# Patient Record
Sex: Male | Born: 1935 | Race: White | Hispanic: No | Marital: Married | Smoking: Former smoker
Health system: Southern US, Community
[De-identification: ages and names within clinical notes are randomized; demographics above are authoritative.]

## PROBLEM LIST (undated history)

## (undated) DIAGNOSIS — R011 Cardiac murmur, unspecified: Secondary | ICD-10-CM

## (undated) DIAGNOSIS — N183 Chronic kidney disease, stage 3 unspecified: Secondary | ICD-10-CM

## (undated) DIAGNOSIS — N390 Urinary tract infection, site not specified: Secondary | ICD-10-CM

## (undated) DIAGNOSIS — N289 Disorder of kidney and ureter, unspecified: Secondary | ICD-10-CM

## (undated) DIAGNOSIS — B1920 Unspecified viral hepatitis C without hepatic coma: Secondary | ICD-10-CM

## (undated) DIAGNOSIS — I10 Essential (primary) hypertension: Secondary | ICD-10-CM

## (undated) DIAGNOSIS — R0602 Shortness of breath: Secondary | ICD-10-CM

## (undated) DIAGNOSIS — N1330 Unspecified hydronephrosis: Secondary | ICD-10-CM

## (undated) DIAGNOSIS — E785 Hyperlipidemia, unspecified: Secondary | ICD-10-CM

## (undated) DIAGNOSIS — T7840XA Allergy, unspecified, initial encounter: Secondary | ICD-10-CM

## (undated) DIAGNOSIS — J449 Chronic obstructive pulmonary disease, unspecified: Secondary | ICD-10-CM

## (undated) DIAGNOSIS — N319 Neuromuscular dysfunction of bladder, unspecified: Secondary | ICD-10-CM

## (undated) DIAGNOSIS — I5040 Unspecified combined systolic (congestive) and diastolic (congestive) heart failure: Secondary | ICD-10-CM

## (undated) DIAGNOSIS — E291 Testicular hypofunction: Secondary | ICD-10-CM

## (undated) DIAGNOSIS — H269 Unspecified cataract: Secondary | ICD-10-CM

## (undated) HISTORY — DX: Allergy, unspecified, initial encounter: T78.40XA

## (undated) HISTORY — PX: BLADDER REPAIR: SHX76

## (undated) HISTORY — PX: FRONTAL SINUS OBLITERATION: SHX1685

## (undated) HISTORY — PX: SPINE SURGERY: SHX786

## (undated) HISTORY — DX: Disorder of kidney and ureter, unspecified: N28.9

## (undated) HISTORY — PX: FRACTURE SURGERY: SHX138

## (undated) HISTORY — DX: Shortness of breath: R06.02

## (undated) HISTORY — DX: Unspecified viral hepatitis C without hepatic coma: B19.20

## (undated) HISTORY — DX: Urinary tract infection, site not specified: N39.0

## (undated) HISTORY — DX: Testicular hypofunction: E29.1

## (undated) HISTORY — PX: EYE SURGERY: SHX253

## (undated) HISTORY — DX: Neuromuscular dysfunction of bladder, unspecified: N31.9

## (undated) HISTORY — PX: HERNIA REPAIR: SHX51

## (undated) HISTORY — PX: PROSTATE SURGERY: SHX751

## (undated) HISTORY — DX: Essential (primary) hypertension: I10

## (undated) HISTORY — PX: TONSILLECTOMY: SUR1361

## (undated) HISTORY — DX: Unspecified cataract: H26.9

## (undated) HISTORY — DX: Cardiac murmur, unspecified: R01.1

## (undated) HISTORY — DX: Hyperlipidemia, unspecified: E78.5

---

## 1999-10-01 ENCOUNTER — Encounter: Payer: Self-pay | Admitting: *Deleted

## 1999-10-01 ENCOUNTER — Ambulatory Visit (HOSPITAL_COMMUNITY): Admission: RE | Admit: 1999-10-01 | Discharge: 1999-10-01 | Payer: Self-pay | Admitting: *Deleted

## 2003-05-31 ENCOUNTER — Ambulatory Visit (HOSPITAL_COMMUNITY): Admission: RE | Admit: 2003-05-31 | Discharge: 2003-05-31 | Payer: Self-pay | Admitting: Pulmonary Disease

## 2003-06-05 ENCOUNTER — Encounter (INDEPENDENT_AMBULATORY_CARE_PROVIDER_SITE_OTHER): Payer: Self-pay

## 2003-06-05 ENCOUNTER — Ambulatory Visit: Admission: RE | Admit: 2003-06-05 | Discharge: 2003-06-05 | Payer: Self-pay | Admitting: Pulmonary Disease

## 2003-06-09 ENCOUNTER — Emergency Department (HOSPITAL_COMMUNITY): Admission: EM | Admit: 2003-06-09 | Discharge: 2003-06-09 | Payer: Self-pay | Admitting: Emergency Medicine

## 2003-06-11 ENCOUNTER — Ambulatory Visit (HOSPITAL_COMMUNITY): Admission: RE | Admit: 2003-06-11 | Discharge: 2003-06-11 | Payer: Self-pay | Admitting: Urology

## 2003-06-27 ENCOUNTER — Encounter (INDEPENDENT_AMBULATORY_CARE_PROVIDER_SITE_OTHER): Payer: Self-pay

## 2003-06-27 ENCOUNTER — Ambulatory Visit (HOSPITAL_COMMUNITY): Admission: RE | Admit: 2003-06-27 | Discharge: 2003-06-27 | Payer: Self-pay | Admitting: Urology

## 2003-06-27 ENCOUNTER — Ambulatory Visit (HOSPITAL_BASED_OUTPATIENT_CLINIC_OR_DEPARTMENT_OTHER): Admission: RE | Admit: 2003-06-27 | Discharge: 2003-06-27 | Payer: Self-pay | Admitting: Urology

## 2003-08-22 ENCOUNTER — Observation Stay (HOSPITAL_COMMUNITY): Admission: RE | Admit: 2003-08-22 | Discharge: 2003-08-23 | Payer: Self-pay | Admitting: Urology

## 2003-08-22 ENCOUNTER — Encounter (INDEPENDENT_AMBULATORY_CARE_PROVIDER_SITE_OTHER): Payer: Self-pay | Admitting: Specialist

## 2005-01-29 ENCOUNTER — Ambulatory Visit: Payer: Self-pay | Admitting: Pulmonary Disease

## 2005-06-13 ENCOUNTER — Inpatient Hospital Stay (HOSPITAL_COMMUNITY): Admission: AD | Admit: 2005-06-13 | Discharge: 2005-06-14 | Payer: Self-pay | Admitting: Neurosurgery

## 2005-06-13 ENCOUNTER — Encounter: Payer: Self-pay | Admitting: Emergency Medicine

## 2005-06-22 ENCOUNTER — Encounter: Admission: RE | Admit: 2005-06-22 | Discharge: 2005-06-22 | Payer: Self-pay | Admitting: Endocrinology

## 2006-05-27 ENCOUNTER — Ambulatory Visit (HOSPITAL_COMMUNITY): Admission: RE | Admit: 2006-05-27 | Discharge: 2006-05-27 | Payer: Self-pay | Admitting: Urology

## 2006-07-07 ENCOUNTER — Ambulatory Visit (HOSPITAL_BASED_OUTPATIENT_CLINIC_OR_DEPARTMENT_OTHER): Admission: RE | Admit: 2006-07-07 | Discharge: 2006-07-07 | Payer: Self-pay | Admitting: Urology

## 2006-08-06 ENCOUNTER — Ambulatory Visit: Payer: Self-pay | Admitting: Pulmonary Disease

## 2006-10-14 ENCOUNTER — Encounter: Admission: RE | Admit: 2006-10-14 | Discharge: 2006-10-14 | Payer: Self-pay | Admitting: Endocrinology

## 2007-02-28 ENCOUNTER — Encounter: Payer: Self-pay | Admitting: Infectious Diseases

## 2007-02-28 ENCOUNTER — Ambulatory Visit (HOSPITAL_COMMUNITY): Admission: RE | Admit: 2007-02-28 | Discharge: 2007-02-28 | Payer: Self-pay | Admitting: Urology

## 2007-03-10 ENCOUNTER — Ambulatory Visit (HOSPITAL_COMMUNITY): Admission: RE | Admit: 2007-03-10 | Discharge: 2007-03-10 | Payer: Self-pay | Admitting: Urology

## 2007-03-18 ENCOUNTER — Ambulatory Visit (HOSPITAL_COMMUNITY): Admission: RE | Admit: 2007-03-18 | Discharge: 2007-03-18 | Payer: Self-pay | Admitting: Urology

## 2007-06-29 DIAGNOSIS — D721 Eosinophilia: Secondary | ICD-10-CM

## 2007-06-29 DIAGNOSIS — J45909 Unspecified asthma, uncomplicated: Secondary | ICD-10-CM | POA: Insufficient documentation

## 2007-08-31 ENCOUNTER — Ambulatory Visit: Payer: Self-pay | Admitting: Internal Medicine

## 2007-09-01 LAB — CONVERTED CEMR LAB
Basophils Absolute: 0.1 10*3/uL (ref 0.0–0.1)
Eosinophils Relative: 9.7 % — ABNORMAL HIGH (ref 0.0–5.0)
HCT: 42.6 % (ref 39.0–52.0)
MCV: 91.2 fL (ref 78.0–100.0)
Monocytes Absolute: 0.9 10*3/uL — ABNORMAL HIGH (ref 0.2–0.7)
Monocytes Relative: 10 % (ref 3.0–11.0)
Neutro Abs: 5 10*3/uL (ref 1.4–7.7)
Neutrophils Relative %: 54 % (ref 43.0–77.0)

## 2009-03-19 ENCOUNTER — Encounter: Admission: RE | Admit: 2009-03-19 | Discharge: 2009-03-19 | Payer: Self-pay | Admitting: Endocrinology

## 2009-09-12 DIAGNOSIS — Z87448 Personal history of other diseases of urinary system: Secondary | ICD-10-CM | POA: Insufficient documentation

## 2009-09-12 DIAGNOSIS — B351 Tinea unguium: Secondary | ICD-10-CM

## 2009-09-27 DIAGNOSIS — J069 Acute upper respiratory infection, unspecified: Secondary | ICD-10-CM | POA: Insufficient documentation

## 2009-12-09 ENCOUNTER — Encounter (INDEPENDENT_AMBULATORY_CARE_PROVIDER_SITE_OTHER): Payer: Self-pay | Admitting: *Deleted

## 2009-12-09 DIAGNOSIS — N1330 Unspecified hydronephrosis: Secondary | ICD-10-CM

## 2009-12-09 DIAGNOSIS — Z87898 Personal history of other specified conditions: Secondary | ICD-10-CM

## 2009-12-09 DIAGNOSIS — Z8619 Personal history of other infectious and parasitic diseases: Secondary | ICD-10-CM

## 2009-12-09 DIAGNOSIS — I10 Essential (primary) hypertension: Secondary | ICD-10-CM

## 2009-12-09 DIAGNOSIS — E119 Type 2 diabetes mellitus without complications: Secondary | ICD-10-CM | POA: Insufficient documentation

## 2009-12-09 DIAGNOSIS — J82 Pulmonary eosinophilia, not elsewhere classified: Secondary | ICD-10-CM

## 2009-12-09 DIAGNOSIS — E785 Hyperlipidemia, unspecified: Secondary | ICD-10-CM

## 2009-12-11 ENCOUNTER — Ambulatory Visit: Payer: Self-pay | Admitting: Infectious Diseases

## 2010-08-21 NOTE — Miscellaneous (Signed)
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) Added new problem of HYDRONEPHROSIS, BILATERAL (ICD-591) Added new problem of UTI'S, HX OF (ICD-V13.00) - 09/12/2009 pseudomonas aeruginosa, greater than 100,000 colony forming units per mL Added new problem of UPPER RESPIRATORY INFECTION (ICD-465.9) Medications: Removed medication of PREDNISONE 10 MG  TABS (PREDNISONE) 2 daily in am with breakfast until 100% better, then 1 each am until seen Changed medication from BENICAR 20 MG  TABS (OLMESARTAN MEDOXOMIL) Take 1 tablet by mouth once a day to BENICAR 40 MG TABS (OLMESARTAN MEDOXOMIL) Take 1 tablet by mouth once a day per PCP Allergies: Added new allergy or adverse reaction of * ALTACE Added new allergy or adverse reaction of CIPRO Observations: Added new observation of NKA: F (12/09/2009 17:03)

## 2010-08-21 NOTE — Miscellaneous (Signed)
Summary: HIPAA Restrictions  HIPAA Restrictions   Imported By: Florinda Marker 12/11/2009 15:15:43  _____________________________________________________________________  External Attachment:    Type:   Image     Comment:   External Document

## 2010-08-21 NOTE — Assessment & Plan Note (Signed)
Summary: new pt  uti pseudomonas   Primary Provider:  gegick  CC:  new patient / uti pseudomonas.  History of Present Illness: 77 yo with long standing bladder outlet obstruction (since he was a young man).  He is referred by Dr Dagoberto Ligas with pseudomonas on ucx since at least 2008 He occas has dysuria that resolves with abx.  He does not think it is due to the psuedomonas.  He has no history of renal stones or indwelling stents.  He currently has no fevers chills, ns, wt loss, dysuria, hematuria, or frequency.    Preventive Screening-Counseling & Management  Alcohol-Tobacco     Alcohol drinks/day: 0     Smoking Status: quit     Year Quit: 2002  Caffeine-Diet-Exercise     Caffeine use/day: coffee     Does Patient Exercise: yes     Type of exercise: tennis     Exercise (avg: min/session): >60     Times/week: <3  Safety-Violence-Falls     Seat Belt Use: yes   Updated Prior Medication List: ZOCOR 40 MG  TABS (SIMVASTATIN) Take 1 tablet by mouth once a day BENICAR 40 MG TABS (OLMESARTAN MEDOXOMIL) Take 1 tablet by mouth once a day per PCP ADVAIR DISKUS 100-50 MCG/DOSE  MISC (FLUTICASONE-SALMETEROL) Inhale 1 puff two times a day ALBUTEROL 90 MCG/ACT  AERS (ALBUTEROL) 2 puffs every 4 hrs as needed HUMALOG 100 UNIT/ML  SOLN (INSULIN LISPRO (HUMAN)) as directed LANTUS 100 UNIT/ML  SOLN (INSULIN GLARGINE) as directed  Current Allergies (reviewed today): ! * ALTACE ! CIPRO Past History:  Past Medical History: Last updated: 08/31/2007 insulin-dependent diabetes hypertension  Past Surgical History: Last updated: 08/31/2007 multiple sinus surgeries remotely  Status post remote herniorrhaphy BPH needs to self catheter.  Follow with Dr. Manon Hilding  Family History: Last updated: 08/31/2007  severe asthma in his father who he reports also had emphysema  Social History: Last updated: 08/31/2007 quit smoking completely in 2003  Risk Factors: Alcohol Use: 0  (12/11/2009) Caffeine Use: coffee (12/11/2009) Exercise: yes (12/11/2009)  Risk Factors: Smoking Status: quit (12/11/2009)  Review of Systems       11 systems reviewed and negative except per HPI   Vital Signs:  Patient profile:   77 year old male Height:      71 inches (180.34 cm) Weight:      155.8 pounds (70.82 kg) BMI:     21.81 Temp:     98.1 degrees F (36.72 degrees C) oral Pulse rate:   65 / minute BP sitting:   122 / 72  (right arm)  Vitals Entered By: Baxter Hire) (Dec 11, 2009 2:30 PM) CC: new patient / uti pseudomonas Pain Assessment Patient in pain? no      Nutritional Status BMI of 19 -24 = normal Nutritional Status Detail appetite is good per patient  Does patient need assistance? Functional Status Self care Ambulation Normal   Physical Exam  General:  alert, well-developed, and well-nourished.   Head:  normocephalic.   Eyes:  vision grossly intact and pupils equal.   Ears:  R ear normal and L ear normal.   Nose:  no external deformity and nose piercing noted.   Mouth:  good dentition and no gingival abnormalities.   Neck:  supple.   Lungs:  normal respiratory effort and no intercostal retractions.   Heart:  normal rate.   Abdomen:  soft and non-tender.   Msk:  normal ROM and no joint tenderness.   Skin:  no rashes.   Psych:  Oriented X3 and memory intact for recent and remote.     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  He also has a hIstory of ucxs with psuedomonal since at least 2008 but he says they have not ever caused an active UTI.  He has had UTIs but his sxs resolve with oral abx that does not cove pseudomonas.  He is allergic to cipro.  He straight caths two times a day without issue.  No sxs of uti at this time.  At this time he likely has colonization with pseudomonas.  I advised him that if he ever develops an  active infection with pseudomonas he would likely need a picc line and 10-14 days of an antipseudomonal abx (his has been sensitivte to zosyn cefempine, imi so any of these would work)  [employer] avoid aminoglycosides given his cri. He will follow up as needed.  I gave him a copy of his most recent ucx resutls.  Orders: Consultation Level IV (54098)  Problem # 2:  HYDRONEPHROSIS, BILATERAL (ICD-591)  follows with Dr Isabel Caprice  Orders: Consultation Level IV [phone])  Patient Instructions: 1)  Folow up as needed for recurrent pseudomonal urinary tract infections. 2)  If you develop a symptomatic UTI with pseudomonas you will need to have  a PICC line placed and received 10 days IV of meropenem (as long as the bacteria is sensitivie to this).

## 2010-08-21 NOTE — Consult Note (Signed)
Summary: [facility]   [facility]   Imported By: Florinda Marker 12/13/2009 10:49:25  _____________________________________________________________________  External Attachment:    Type:   Image     Comment:   External Document

## 2010-10-22 ENCOUNTER — Encounter: Payer: Self-pay | Admitting: *Deleted

## 2010-12-05 NOTE — Op Note (Signed)
Benjamin Riddle, Benjamin Riddle               ACCOUNT NO.:  [id number]   MEDICAL RECORD NO.:  [id number]          PATIENT TYPE:  AMB   LOCATION:  [facility]                         FACILITY:  [facility]   PHYSICIAN:  Valetta Fuller, M.D.  DATE OF BIRTH:  1928/03/13   DATE OF PROCEDURE:  07/07/2006  DATE OF DISCHARGE:                               OPERATIVE REPORT   PREOPERATIVE DIAGNOSES:  1. Bilateral hydronephrosis, right much greater than left.  2. 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  3. Neurogenic bladder.   POSTOPERATIVE DIAGNOSES:  1. Bilateral hydronephrosis, right much greater than left.  2. 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  3. Neurogenic bladder.   PROCEDURES PERFORMED:  1. Cystoscopy.  2. Right retrograde pyelogram.  3. Right flexible ureteroscopy.  4. Attempted right double-J stent placement.   SURGEON:  Valetta Fuller, M.D.   ANESTHESIA:  General.   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  He is  currently 77 years of age.  He has had very longstanding voiding  dysfunction and had urologic intervention and evaluation dating back 50+  years.  The patient has had chronic bladder neck obstruction which is  felt to be secondary to a failure relax his bladder neck with chronic  outlet obstruction.  He has had a very small prostate.  The patient has  had bilateral hydronephrosis, and we originally assessed him about 3  years ago.  At that time he had severe bilateral hydronephrosis with  some cortical thinning, especially on the right.  His bladder was  markedly distended with a postvoid residual of about 1500 mL but he had  no symptoms and had what we felt was silent obstructive uropathy.  His  creatinine was reasonably normal at that time at 1.6.  A renal scan  several years ago showed left-sided renal function at 70% with 30% on  the right.  We ended up assessing him.  He had markedly dilated and  torturous  ureters bilaterally but there really was no evidence of fixed  obstruction.  We felt that this again was probably a nonobstructive,  nonrefluxing megaureter due adynamic ureter as well as the longstanding  bladder obstruction.  A limited TUR of his bladder neck really never  resulted in any significant improvement in his voiding.  The patient has  been on intermittent catheterization now for several years.  Recently  his creatinine increased to 2.0 and we did a Lasix renal scan once  again.  This actually showed 60% of total renal mass on the left with  40% on the right, but he did have a marked abnormality on the right with  regard to response to Lasix and no real washout.  We felt that right  side needed to be reassessed.   TECHNIQUE AND FINDINGS:  The patient was brought to the operating room,  where he had successful induction of general anesthesia.  He was placed  in lithotomy position and prepped and draped in the usual manner.  The  patient underwent rigid cystoscopy.  This revealed unremarkable anterior  urethra.  His prostatic  urethra again was quite small with minimal  lateral lobe tissue.  He did have some fixation and scarring of his  bladder neck, which reduced mobility of the cystoscope, but there did  not appear to be any major visual obstruction.  The bladder showed some  chronic inflammatory changes and some chronic thickening with mild  cellules.  We went ahead and did a retrograde pyelogram on the left,  which showed a dilated ureter with some mild tortuosity but no evidence  of obstruction, and that seemed to drain pretty well.  On the right the  patient had marked tortuosity and dilation of the ureter with a 360-  degree loop just underneath the ureteropelvic junction.  I was able to  get a guidewire into the renal pelvis.  I placed an access sheath and  used a flexible ureteroscope, which was able to be advanced all the way  to the renal pelvis.  Again, there was no  evidence of any fixed  obstruction, no evidence of stone, tumor or other pathology.  Despite  multiple attempts for over a half hour, I was unable to straighten out  the ureter.  Unfortunately, the stent length of only 28 cm at maximum  length was certainly not anywhere adequate in order to place a stent  from the renal pelvis to the bladder given the tortuosity and complete  looping of his ureter.  For that reason we were unsuccessful in stent  placement and will discuss with him the possibility of a percutaneous  approach to see if we could straighten out the ureter and place a stent.  He was brought to recovery room in stable condition.           ______________________________  Valetta Fuller, M.D.  Electronically Signed     DSG/MEDQ  D:  07/07/2006  T:  07/07/2006  Job:  [id number]   cc:   Alfonse Alpers. Dagoberto Ligas, M.D.  Fax: [phone]

## 2010-12-05 NOTE — Op Note (Signed)
[name], [name]                           ACCOUNT NO.:  [id number]   MEDICAL RECORD NO.:  [id number]                   PATIENT TYPE:  AMB   LOCATION:  [facility]                                 FACILITY:  [facility]   PHYSICIAN:  Valetta Fuller, M.D.               DATE OF BIRTH:  1928/04/08   DATE OF PROCEDURE:  06/27/2003  DATE OF DISCHARGE:                                 OPERATIVE REPORT   PREOPERATIVE DIAGNOSES:  1. Atonic, hypocontractile neurogenic bladder.  2. Urinary retention.  3. Bladder neck obstruction.  4. Bilateral hydronephrosis.  5. Chronic renal insufficiency.   POSTOPERATIVE DIAGNOSES:  1. Atonic, hypocontractile neurogenic bladder.  2. Urinary retention.  3. Bladder neck obstruction.  4. Bilateral hydronephrosis.  5. Chronic renal insufficiency.   PROCEDURES PERFORMED:  1. Cystoscopy.  2. Bilateral retrograde pyelography.  3. Right-sided ureteroscopy.  4. Transurethral incision of bladder neck.  5. Suprapubic tube placement.   SURGEON:  Valetta Fuller, M.D.   ANESTHESIA:  General.   INDICATIONS:  Mr. Benjamin Riddle has a very complex urologic history.  Apparently  as a young man he had voiding dysfunction and was evaluated in [place].  He  was felt to have some bladder neck obstruction.  He was also noted to have  hydronephrosis, but it does not appear that any additional evaluation was  really undertaken.  At one point there was some contemplation of doing a  procedure on his bladder neck but they were concerned about potential for  causing infertility issues, and therefore it was not done.  The patient has  had longstanding obstructive voiding symptoms with a very slow stream and  sensation of incomplete emptying.  To his knowledge, the hydronephrosis has  not been further evaluated.  He has been having some ongoing pulmonary  issues and was diagnosed with an eosinophilic pneumonia/pneumonitis.  As  part of his evaluation a chest CT was done, which revealed  severe bilateral  hydronephrosis on some of the upper abdominal cuts.  He presented to our  office and an ultrasound confirmed with severe bilateral hydronephrosis with  some cortical thinning, especially on the right.  His bladder was noted to  be distended with 1500 mL despite really no symptoms.  Catheter drainage was  performed, and his serum creatinine was on the high side but actually normal  at 1.6.  A subsequent renal scan showed preferential function of the left  kidney estimated at 70%, versus 30% on the right.  There was no good  drainage of the radionuclide.  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  We recommended further assessment  of his ureter, bladder, and bladder neck.  I proposed the operation that we  plan today, and this plan was accepted.   TECHNIQUE AND FINDINGS:  The patient was brought to the operating room,  where he had successful  induction of general anesthesia after a failed  attempt at spinal anesthetic.  He was placed in lithotomy position and  prepped and draped in the usual manner.  Cystoscopy revealed minimal lateral  lobe hyperplasia but a fairly prominent median bar.  His bladder was heavily  trabeculated.  Retrograde pyelography revealed markedly dilated ureters  bilaterally but no evidence of obvious obstruction.  The right side was more  dilated.  A guidewire was placed up to the right renal pelvis and  ureteroscopy was done to assess the distal ureter.  One was able to insert  the ureteroscope without the need for dilation, and there did not appar to  be an obvious obstruction.  His ureters, however, were markedly dilated and  tortuous.  This was consistent potentially with longstanding reflux or  possibly a nonobstructive, nonrefluxing megaureter.  I did not feel that  double J stents would provide any increased drainage of either collecting  system and, with time, the contrast did exit both sides  fairly well.   The cystoscope was then removed and the resectoscope was inserted.  Initially a [employer] was used to perform a transurethral incision of the  prostate from between the ureteral orifices back to the veru.  There was a  flap of tissue, which we then removed with the resectoscope using the  standard loop.  In essence, however, we only really resected the bladder  neck and performed a limited resection to open up the bladder neck.  At the  completion of this a Lowsley retractor was used to place a 22 French  suprapubic tube, which was confirmed to be in good position.  The patient  appeared to tolerate the procedure well, and there were no obvious  complications.                                               Valetta Fuller, M.D.    DSG/MEDQ  D:  06/27/2003  T:  06/28/2003  Job:  [id number]   cc:   Oley Balm. Sung Amabile, M.D. [facility]

## 2010-12-05 NOTE — Op Note (Signed)
NAMENIVAN, MELENDREZ                           ACCOUNT NO.:  [id number]   MEDICAL RECORD NO.:  [id number]                   PATIENT TYPE:  AMB   LOCATION:  [facility]                                 FACILITY:  [facility]   PHYSICIAN:  Oley Balm. Sung Amabile, M.D. [facility]          DATE OF BIRTH:  1927/12/10   DATE OF PROCEDURE:  06/05/2003  DATE OF DISCHARGE:  06/05/2003                                 OPERATIVE REPORT   Please note that this procedure was performed at [facility], not  at [facility].   PROCEDURE:  Bronchoscopy.   INDICATIONS:  Bilateral pulmonary infiltrates.   PREMEDICATION:  Versed 4 mg IV, fentanyl 50 mcg IV.   ANESTHESIA:  Topical anesthesia was applied to the nose and throat and 40 cc  of 1% lidocaine were used during the course of the procedure.   PROCEDURE:  After adequate sedation and anesthesia, the bronchoscope was  introduced via the right naris and advanced into the posterior pharynx  demonstrating normal upper airway anatomy.  The vocal cords moved  symmetrically.  Further anesthesia was achieved with 1% lidocaine.  The  scope was advanced down the trachea and complete airway anesthesia was  achieved with 1% lidocaine.  An airway examination was performed and this  demonstrated normal segmental bronchial anatomy.  There appeared to be  involutions of the bronchial mucosa in the right lower lobe bronchus but  no endobronchial tumors, masses or foreign bodies were noted.  After  completion of the airway examination, the bronchoscope was directed into the  right upper lobe where washings, brushings and transbronchial biopsies were  performed.  The biopsies were performed under fluoroscopic guidance.  Five  to six biopsies were obtained.  The patient tolerated the procedure well.  After completion of the procedure, his chest was scanned with fluoroscopy  and there was no evidence of pneumothorax.  The above specimens were sent  for AFB studies, fungal  studies, cytology and surgical pathology.   Please note again that this was done at [facility].                                               Oley Balm. Sung Amabile, M.D. [facility]    DBS/MEDQ  D:  06/15/2003  T:  06/15/2003  Job:  [id number]

## 2010-12-05 NOTE — H&P (Signed)
NAMESPENCER, CARDINAL               ACCOUNT NO.:  [id number]   MEDICAL RECORD NO.:  [id number]          PATIENT TYPE:  INP   LOCATION:  3106                         FACILITY:  [facility]   PHYSICIAN:  Danae Orleans. Venetia Maxon, M.D.  DATE OF BIRTH:  31-Oct-1927   DATE OF ADMISSION:  06/13/2005  DATE OF DISCHARGE:                                HISTORY & PHYSICAL   REASON FOR ADMISSION:  Fall and hit his head, subarachnoid hemorrhage and  skull fracture.   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 He is diabetic. He had loss of  consciousness. He complains of a headache. He went to [facility] Emergency Room. He had a scalp laceration, which was repaired. He  had a head CT, which shows a small right frontal subarachnoid hemorrhage and  linear skull fracture, running almost the entire length of his skull in the  right parasagittal plane. The patient has a history of prior sinus surgery  for osteomyelitis with obliteration of the frontal sinus and plating with  what appears to be a methylmethacrylate plate. The patient is transferred  from [facility] for observation and surgical admission.   PAST MEDICAL HISTORY:  1.  Significant for diabetes.  2.  He has bladder neck stricture with history of hydronephrosis and      overflow incontinence, and he self catheterizes b.i.d.  3.  He had rheumatic fever with a systolic ejection murmur.   MEDICATIONS:  1.  Benicar daily.  2.  Zocor 40 mg daily.  3.  Insulin Lantus 25 units each morning, 8 units Humalog each morning, 4 to      5 units of Humalog at lunch, and 8 to 9 units of Humalog at dinner.   ALLERGIES:  He has no known drug allergies.   PHYSICAL EXAMINATION:  GENERAL:  The patient is awake, alert, and  conversant. He speaks with clear and fluent speech.  HEENT:  Pupils are equal, round, and reactive to light. He has proptosis,  OS, which is old. His extraocular movement are intact. He has no diplopia.  He has an occipital laceration.  NECK:  No tenderness.  CHEST:  Clear to auscultation.  HEART:  Regular rate and rhythm with systolic ejection murmur.  ABDOMEN:  Soft, nontender, with active bowel sounds.  EXTREMITIES:  Without clubbing, cyanosis, or edema.  NEUROLOGIC:  He has 5/5 upper and lower extremity strength in all motor  groups bilaterally symmetric. Deep tendon reflexes are 2 in the biceps,  triceps, and brachioradialis, 2 at the knees, 2 at the ankles. Toes are  downgoing to plantar stimulation. Sensation is intact. Cerebellar testing is  normal.   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 He has a subarachnoid hemorrhage. He is to be admitted and  observed. Will have frequent neurologic checks. Repeat head CT in the  morning.  Danae Orleans. Venetia Maxon, M.D.  Electronically Signed     JDS/MEDQ  D:  06/13/2005  T:  06/13/2005  Job:  [id number]

## 2010-12-05 NOTE — Op Note (Signed)
NAMEJAKYRI, BRUNKHORST                           ACCOUNT NO.:  [id number]   MEDICAL RECORD NO.:  [id number]                   PATIENT TYPE:  OBV   LOCATION:  0353                                 FACILITY:  [facility]   PHYSICIAN:  Valetta Fuller, M.D.               DATE OF BIRTH:  [birth year]   DATE OF PROCEDURE:  08/22/2003  DATE OF DISCHARGE:                                 OPERATIVE REPORT   PREOPERATIVE DIAGNOSES:  1. Urinary retention.  2. Benign prostatic hypertrophy.  3. Bladder neck contracture.  4. Atonic hypocontractile neurogenic bladder.   POSTOPERATIVE DIAGNOSES:  1. Urinary retention.  2. Benign prostatic hypertrophy.  3. Bladder neck contracture.  4. Atonic hypocontractile neurogenic bladder.   PROCEDURE:  Cystoscopy, urethral dilation with dilation of bladder neck  contracture, transurethral resection of bladder neck contracture,  transurethral resection of the prostate and changing of suprapubic tube.   SURGEON:  Valetta Fuller, M.D.   ANESTHESIA:  General.   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  He has quite a complex urologic history. He has been diagnosed with a very  hypocontractile atonic bladder.  He has had post void residuals well in  excess of 1000 mL.  He has had bilateral hydronephrosis which is thought to  be both secondary to his chronic urinary retention but also potentially due  to some component of adynamic ureters bilaterally.  He is thought to  potentially have non-obstructive, non-refluxing megaureters, especially on  the right.  The patient recently had a transurethral resection of his  prostate with placement of a suprapubic tube. He was able to void initially,  but subsequently was unable to void.  We recently diagnosed a bladder neck  contracture. We discussed the option of trying to be more aggressive with  resection of his prostate and bladder neck to give him one additional chance  to see if he  could void spontaneously.  This was all discussed at length  with the patient and he presents now for this.  The patient does have an  indwelling suprapubic tube catheter.   DESCRIPTION OF PROCEDURE:  The patient was brought to the operating room  where he had successful induction of general anesthesia.  He was then placed  in the lithotomy position and prepped and draped in the usual manner.  A  preoperative urine culture had been obtained and organism identified. This  was sensitive to Septra which was started approximately 36 hours prior to  his procedure.  The patient also received intravenous antibiotics prior to  initiation of the procedure.  Cystoscopy again revealed not a terribly  impressively enlarged prostate, but there was some visual obstruction.  The  patient had some fusion of his lateral lobes of the prostate and a bladder  neck contracture. We used [employer] sounds to dilate this to about 28 [place]  to allow for passage  of the resectoscope sheath which was then done without  difficulty.  The ureteral orifices were identified and the bladder neck  contracture was then resected.  The prostate was then resected in a standard  manner.  We started with the right lateral lobe which was taken from the 12  o'clock towards the 6 o'clock position. We did leave some extreme apical  tissue to decrease the risk of some urinary incontinence.  The prostate was  resected down to capsular fibers.  The left lobe was resected in a similar  manner and about 10 to 15 grams of tissue were removed. We then did a little  bit of additional resection right at 6 o'clock to really widely open the  bladder neck. At the completion of the procedure, a 24 [place] A[place]worth  catheter was placed.  The suprapubic tube was changed and in and out  irrigation was performed with normal saline.  Urine was quite clear at the  end of the procedure and he appeared to tolerate the procedure well.                                                Valetta Fuller, M.D.    DSG/MEDQ  D:  08/22/2003  T:  08/22/2003  Job:  [id number]

## 2011-01-09 ENCOUNTER — Encounter (HOSPITAL_COMMUNITY): Payer: Medicare Other

## 2011-01-15 ENCOUNTER — Ambulatory Visit (HOSPITAL_COMMUNITY): Payer: Medicare Other | Attending: Internal Medicine

## 2011-01-15 ENCOUNTER — Other Ambulatory Visit: Payer: Self-pay | Admitting: Internal Medicine

## 2011-01-15 DIAGNOSIS — I959 Hypotension, unspecified: Secondary | ICD-10-CM | POA: Insufficient documentation

## 2011-01-16 LAB — ACTH STIMULATION, 3 TIME POINTS
Cortisol, 30 Min: 14.4 ug/dL (ref >20)
Cortisol, 60 Min: 17 ug/dL (ref >20)

## 2011-03-22 ENCOUNTER — Emergency Department (HOSPITAL_BASED_OUTPATIENT_CLINIC_OR_DEPARTMENT_OTHER)
Admission: EM | Admit: 2011-03-22 | Discharge: 2011-03-22 | Disposition: A | Discharge status: Other Acute Inpatient Hospital | Payer: Medicare Other | Source: Home / Self Care | Attending: Emergency Medicine | Admitting: Emergency Medicine

## 2011-03-22 ENCOUNTER — Other Ambulatory Visit: Payer: Self-pay

## 2011-03-22 ENCOUNTER — Encounter: Payer: Self-pay | Admitting: *Deleted

## 2011-03-22 ENCOUNTER — Inpatient Hospital Stay (HOSPITAL_COMMUNITY)
Admission: AD | Admit: 2011-03-22 | Discharge: 2011-03-25 | DRG: 638 | Disposition: A | Discharge status: Home / Self Care | Payer: Medicare Other | Source: Other Acute Inpatient Hospital | Attending: Internal Medicine | Admitting: Internal Medicine

## 2011-03-22 ENCOUNTER — Emergency Department (INDEPENDENT_AMBULATORY_CARE_PROVIDER_SITE_OTHER): Payer: Medicare Other

## 2011-03-22 DIAGNOSIS — N12 Tubulo-interstitial nephritis, not specified as acute or chronic: Secondary | ICD-10-CM

## 2011-03-22 DIAGNOSIS — R739 Hyperglycemia, unspecified: Secondary | ICD-10-CM

## 2011-03-22 DIAGNOSIS — R112 Nausea with vomiting, unspecified: Secondary | ICD-10-CM | POA: Diagnosis present

## 2011-03-22 DIAGNOSIS — N320 Bladder-neck obstruction: Secondary | ICD-10-CM | POA: Diagnosis present

## 2011-03-22 DIAGNOSIS — J45909 Unspecified asthma, uncomplicated: Secondary | ICD-10-CM | POA: Insufficient documentation

## 2011-03-22 DIAGNOSIS — Z794 Long term (current) use of insulin: Secondary | ICD-10-CM

## 2011-03-22 DIAGNOSIS — I129 Hypertensive chronic kidney disease with stage 1 through stage 4 chronic kidney disease, or unspecified chronic kidney disease: Secondary | ICD-10-CM | POA: Diagnosis present

## 2011-03-22 DIAGNOSIS — N1330 Unspecified hydronephrosis: Secondary | ICD-10-CM | POA: Diagnosis present

## 2011-03-22 DIAGNOSIS — E1169 Type 2 diabetes mellitus with other specified complication: Secondary | ICD-10-CM | POA: Insufficient documentation

## 2011-03-22 DIAGNOSIS — N390 Urinary tract infection, site not specified: Secondary | ICD-10-CM | POA: Diagnosis present

## 2011-03-22 DIAGNOSIS — N183 Chronic kidney disease, stage 3 unspecified: Secondary | ICD-10-CM | POA: Diagnosis present

## 2011-03-22 DIAGNOSIS — M810 Age-related osteoporosis without current pathological fracture: Secondary | ICD-10-CM | POA: Diagnosis present

## 2011-03-22 DIAGNOSIS — N319 Neuromuscular dysfunction of bladder, unspecified: Secondary | ICD-10-CM | POA: Diagnosis present

## 2011-03-22 DIAGNOSIS — E291 Testicular hypofunction: Secondary | ICD-10-CM | POA: Diagnosis present

## 2011-03-22 DIAGNOSIS — Z87440 Personal history of urinary (tract) infections: Secondary | ICD-10-CM

## 2011-03-22 DIAGNOSIS — N179 Acute kidney failure, unspecified: Secondary | ICD-10-CM | POA: Diagnosis present

## 2011-03-22 DIAGNOSIS — R1110 Vomiting, unspecified: Secondary | ICD-10-CM

## 2011-03-22 DIAGNOSIS — B182 Chronic viral hepatitis C: Secondary | ICD-10-CM | POA: Diagnosis present

## 2011-03-22 DIAGNOSIS — E1010 Type 1 diabetes mellitus with ketoacidosis without coma: Principal | ICD-10-CM | POA: Diagnosis present

## 2011-03-22 HISTORY — DX: Unspecified hydronephrosis: N13.30

## 2011-03-22 LAB — BASIC METABOLIC PANEL
Chloride: 100 mEq/L (ref 96–112)
GFR calc Af Amer: 39 mL/min — ABNORMAL LOW (ref >60)
GFR calc non Af Amer: 32 mL/min — ABNORMAL LOW (ref >60)
GFR calc non Af Amer: 38 mL/min — ABNORMAL LOW (ref >60)
Glucose, Bld: 280 mg/dL — ABNORMAL HIGH (ref 70–99)
Glucose, Bld: 104 mg/dL — ABNORMAL HIGH (ref 70–99)
Potassium: 4.1 mEq/L (ref 3.5–5.1)
Sodium: 138 mEq/L (ref 135–145)
Sodium: 137 mEq/L (ref 135–145)

## 2011-03-22 LAB — GLUCOSE, CAPILLARY
Glucose-Capillary: 189 mg/dL — ABNORMAL HIGH (ref 70–99)
Glucose-Capillary: 115 mg/dL — ABNORMAL HIGH (ref 70–99)

## 2011-03-22 LAB — URINALYSIS, ROUTINE W REFLEX MICROSCOPIC
Bilirubin Urine: NEGATIVE
Nitrite: NEGATIVE
Specific Gravity, Urine: 1.014 (ref 1.005–1.030)
Urobilinogen, UA: 0.2 mg/dL (ref 0.0–1.0)
pH: 6 (ref 5.0–8.0)

## 2011-03-22 LAB — DIFFERENTIAL
Basophils Absolute: 0.1 10*3/uL (ref 0.0–0.1)
Eosinophils Relative: 0 % (ref 0–5)
Lymphs Abs: 1.2 10*3/uL (ref 0.7–4.0)
Monocytes Relative: 7 % (ref 3–12)
Neutrophils Relative %: 82 % — ABNORMAL HIGH (ref 43–77)

## 2011-03-22 LAB — CBC
HCT: 41.5 % (ref 39.0–52.0)
MCHC: 34 g/dL (ref 30.0–36.0)
MCV: 90.4 fL (ref 78.0–100.0)
RBC: 4.59 MIL/uL (ref 4.22–5.81)

## 2011-03-22 LAB — URINE MICROSCOPIC-ADD ON

## 2011-03-22 MED ORDER — ONDANSETRON HCL 4 MG/2ML IJ SOLN
4.0000 mg | Freq: Once | INTRAMUSCULAR | Status: AC
  Administered 2011-03-22: 4 mg via INTRAVENOUS
  Filled 2011-03-22: qty 2

## 2011-03-22 MED ORDER — FAMOTIDINE IN NACL 20-0.9 MG/50ML-% IV SOLN
20.0000 mg | Freq: Once | INTRAVENOUS | Status: AC
  Administered 2011-03-22: 20 mg via INTRAVENOUS
  Filled 2011-03-22: qty 50

## 2011-03-22 MED ORDER — SODIUM CHLORIDE 0.9 % IV SOLN: INTRAVENOUS | Status: DC

## 2011-03-22 MED ORDER — DEXTROSE 5 % IV SOLN
1.0000 g | Freq: Once | INTRAVENOUS | Status: AC
  Administered 2011-03-22: 1 g via INTRAVENOUS
  Filled 2011-03-22: qty 1

## 2011-03-22 MED ORDER — SODIUM CHLORIDE 0.9 % IV BOLUS (SEPSIS)
1000.0000 mL | Freq: Once | INTRAVENOUS | Status: AC
  Administered 2011-03-22: 1000 mL via INTRAVENOUS

## 2011-03-22 MED ORDER — DEXTROSE 50 % IV SOLN: 25.0000 mL | INTRAVENOUS | Status: DC | PRN

## 2011-03-22 MED ORDER — SODIUM CHLORIDE 0.9 % IV SOLN
INTRAVENOUS | Status: DC
  Administered 2011-03-22: 14:00:00 via INTRAVENOUS
  Filled 2011-03-22 (×2): qty 3

## 2011-03-22 NOTE — ED Notes (Signed)
Glucose 158, insulin drip @ 2.9 units/hour

## 2011-03-22 NOTE — ED Notes (Signed)
Spoke with admitting MD Dr Martha Clan and instructed that patient should remain on glucose stabilizer for transfer to unit, MD to be paged on arrival and addition orders to be given at that time. RN [place] @ 5 Chad informed of MD request and, order written on admission order sheet. Benjamin Riddle verbalized understanding. [phone]. Transfer to room [address].

## 2011-03-22 NOTE — ED Notes (Signed)
Glucose - 240, insulin drip infusion rate 3.3 per glucose controller

## 2011-03-22 NOTE — ED Notes (Signed)
Patient c/o vomiting since Friday afternoon, some diarrhea, unable to hold down food or fluids

## 2011-03-22 NOTE — ED Notes (Signed)
Patient transferred by [facility] since [facility] delayed by several hours. Transfer discussed with wife & she will follow ambulance to [facility].

## 2011-03-22 NOTE — ED Provider Notes (Signed)
History     CSN: [id number] Arrival date & time: 03/22/2011 11:10 AM  Chief Complaint  Patient presents with  . Emesis   HPI Comments: Patient comes in complaining of 2 days of nausea and vomiting. He did initially have some diarrhea but that has resolved. He denies any fevers. He has no significant abdominal pain. He has not been able to tolerate any by mouth intake since Friday when this started. Patient also is concerned because he is a diabetic and he is been having high blood sugars at home despite using his Lantus and Humalog as his normal directions would indicate. Patient denies any chest pain, cough, dysuria type symptoms.  Patient is a 77 y.o. male presenting with vomiting. The history is provided by the patient and the spouse. No language interpreter was used.  Emesis  This is a new problem. The current episode started 2 days ago. The problem occurs 2 to 4 times per day. The problem has not changed since onset.The emesis has an appearance of stomach contents. There has been no fever. Associated symptoms include diarrhea. Pertinent negatives include no abdominal pain, no arthralgias, no chills, no cough, no fever, no headaches, no myalgias, no sweats and no URI.    Past Medical History  Diagnosis Date  . Diabetes mellitus   . Asthma   . Hydronephrosis     Past Surgical History  Procedure Date  . Hernia repair   . Tonsillectomy   . Bladder repair     obstruction surgery  . Frontal sinus obliteration     sinus repaired with a plate    No family history on file.  History  Substance Use Topics  . Smoking status: Never Smoker   . Smokeless tobacco: Not on file  . Alcohol Use: No      Review of Systems  Constitutional: Negative.  Negative for fever and chills.  HENT: Negative.   Eyes: Negative.  Negative for discharge and redness.  Respiratory: Negative.  Negative for cough and shortness of breath.   Cardiovascular: Negative.  Negative for chest pain.    Gastrointestinal: Positive for nausea, vomiting and diarrhea. Negative for abdominal pain.  Genitourinary: Negative.  Negative for hematuria.  Musculoskeletal: Negative.  Negative for myalgias, back pain and arthralgias.  Skin: Negative.  Negative for color change and rash.  Neurological: Negative for syncope and headaches.  Hematological: Negative.  Negative for adenopathy.  Psychiatric/Behavioral: Negative.  Negative for confusion.  All other systems reviewed and are negative.    Physical Exam  BP 180/80  Pulse 82  Temp 97.7 F (36.5 C)  Resp 16  SpO2 96%  Physical Exam  Constitutional: He is oriented to person, place, and time. He appears well-developed and well-nourished.  Non-toxic appearance. He does not have a sickly appearance.  HENT:  Head: Normocephalic and atraumatic.  Eyes: Conjunctivae, EOM and lids are normal. Pupils are equal, round, and reactive to light.  Neck: Trachea normal, normal range of motion and full passive range of motion without pain. Neck supple.  Cardiovascular: Normal rate, regular rhythm and normal heart sounds.   Pulmonary/Chest: Effort normal and breath sounds normal. No respiratory distress.  Abdominal: Soft. Normal appearance. He exhibits no distension. There is no tenderness. There is no rebound and no CVA tenderness.  Musculoskeletal: Normal range of motion.  Neurological: He is alert and oriented to person, place, and time. He has normal strength.  Skin: Skin is warm, dry and intact. No rash noted.  Psychiatric: He has  a normal mood and affect. His behavior is normal. Judgment and thought content normal.    ED Course  Procedures  MDM  Date: 03/22/2011  Rate: 77  Rhythm: normal sinus rhythm  QRS Axis: normal  Intervals: normal  ST/T Wave abnormalities: normal  Conduction Disutrbances:none  Narrative Interpretation:   Old EKG Reviewed: unchanged from 06/26/2003    Patient appears to have pyelonephritis at this point in time  based on his urinalysis results which show many bacteria and positive leukocytosis. I will at a urine culture on and give the patient a dose of ceftriaxone for antibiotic treatment. Patient's nausea and vomiting is improving with Zofran. Patient is hyperglycemic with mildly elevated anion gap of 18. Patient may be partially just dehydrated due to his general illness and infection which is worsened by his hyperglycemia despite taking his insulin at home. Given the patient's glucose is 280 at this time and is not significantly acidotic I will treat him with a glucose stabilizer protocol. Patient's vitals are otherwise stable. Patient is to be transferred to [facility] and admitted by Dr. Waynard Edwards from [facility]. Spoke with Dr. Daleen Bo Ava regarding the transfer.    Nat Christen, MD 03/22/11 [phone]

## 2011-03-22 NOTE — ED Notes (Signed)
Patients family member insists on helping patient with catheter. He self catheterizes at home and has been given supplies.

## 2011-03-22 NOTE — ED Notes (Signed)
Patients family member inquires about an insulin shot.She states he needs one now to bring his sugar down. MD made aware. Patient obtaining urine specimen at thsi time.

## 2011-03-22 NOTE — ED Notes (Signed)
Glucose 189 - insulin drip @ 3.9 units per hour per glucose stabilizer

## 2011-03-23 LAB — CBC
MCV: 90.7 fL (ref 78.0–100.0)
Platelets: 328 10*3/uL (ref 150–400)
RBC: 4.31 MIL/uL (ref 4.22–5.81)
WBC: 11.5 10*3/uL — ABNORMAL HIGH (ref 4.0–10.5)

## 2011-03-23 LAB — BASIC METABOLIC PANEL
CO2: 23 mEq/L (ref 19–32)
Chloride: 106 mEq/L (ref 96–112)
Creatinine, Ser: 1.67 mg/dL — ABNORMAL HIGH (ref 0.50–1.35)
Sodium: 138 mEq/L (ref 135–145)

## 2011-03-23 LAB — GLUCOSE, CAPILLARY
Glucose-Capillary: 32 mg/dL — CL (ref 70–99)
Glucose-Capillary: 63 mg/dL — ABNORMAL LOW (ref 70–99)
Glucose-Capillary: 104 mg/dL — ABNORMAL HIGH (ref 70–99)
Glucose-Capillary: 120 mg/dL — ABNORMAL HIGH (ref 70–99)

## 2011-03-24 LAB — CBC
Hemoglobin: 12.6 g/dL — ABNORMAL LOW (ref 13.0–17.0)
MCH: 30 pg (ref 26.0–34.0)
MCV: 89.8 fL (ref 78.0–100.0)
RBC: 4.2 MIL/uL — ABNORMAL LOW (ref 4.22–5.81)

## 2011-03-24 LAB — BASIC METABOLIC PANEL
CO2: 26 mEq/L (ref 19–32)
Calcium: 9.1 mg/dL (ref 8.4–10.5)
Creatinine, Ser: 1.67 mg/dL — ABNORMAL HIGH (ref 0.50–1.35)
Glucose, Bld: 80 mg/dL (ref 70–99)

## 2011-03-24 LAB — GLUCOSE, CAPILLARY
Glucose-Capillary: 293 mg/dL — ABNORMAL HIGH (ref 70–99)
Glucose-Capillary: 212 mg/dL — ABNORMAL HIGH (ref 70–99)

## 2011-03-25 LAB — CBC
MCV: 90.4 fL (ref 78.0–100.0)
Platelets: 279 10*3/uL (ref 150–400)
RBC: 4.17 MIL/uL — ABNORMAL LOW (ref 4.22–5.81)
RDW: 13.3 % (ref 11.5–15.5)
WBC: 9.8 10*3/uL (ref 4.0–10.5)

## 2011-03-25 LAB — BASIC METABOLIC PANEL
CO2: 27 mEq/L (ref 19–32)
Calcium: 9 mg/dL (ref 8.4–10.5)
GFR calc Af Amer: 46 mL/min — ABNORMAL LOW (ref >60)
Sodium: 138 mEq/L (ref 135–145)

## 2011-03-25 LAB — URINE CULTURE: Colony Count: >=100000

## 2011-03-25 LAB — GLUCOSE, CAPILLARY: Glucose-Capillary: 90 mg/dL (ref 70–99)

## 2011-03-25 NOTE — Discharge Summary (Signed)
NAMEAYDON, SWAMY               ACCOUNT NO.:  [id number]  MEDICAL RECORD NO.:  [id number]  LOCATION:  1539                         FACILITY:  [facility]  PHYSICIAN:  Solveig Fangman A. Cashtyn Pouliot, M.D.   DATE OF BIRTH:  Nov 17, 1927  DATE OF ADMISSION:  03/22/2011 DATE OF DISCHARGE:  03/25/2011                              DISCHARGE SUMMARY   DISCHARGE DIAGNOSES: 1. Type 1 diabetes. 2. Stage III chronic kidney disease. 3. Hypertension. 4. Neurogenic bladder requiring in-and-out catheterizations at home     with recurrent resistant urinary tract infections with a possible     urinary tract infection this admission although it was not     completely confirmed. 5. Nausea and vomiting with mild diabetic ketoacidosis this admission,     resolved. 6. Hypogonadism. 7. Osteoporosis. 8. Hydronephrosis secondary to bladder neck obstruction, he requires     self-catheterizations twice daily. 9. 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 10.Allergic rhinitis. 11.Hepatitis C due to blood transfusion. 12.Labile blood sugars.  PROCEDURES:  None.  DISCHARGE MEDICATIONS: 1. Tylenol 60 mg every 6 hours as needed. 2. Align probiotic one tablet daily for 2 weeks, then stop. 3. Benicar 40 mg once daily. 4. Advair 100/50 one puff twice daily. 5. Albuterol inhaler two puffs every 4 hours as needed. 6. Benadryl 25 mg daily at bedtime. 7. Humalog sliding scale insulin.  He will continue dosing this as     before.  Typically, he would take 8 units with breakfast, 4 units     with lunch, and 8-10 units with supper and a small amount at     bedtime as needed per his sliding scale. 8. Ibuprofen 200 mg two tablets every 8 hours as needed. 9. Lantus insulin 17 units subcutaneous each morning and he will     gradually transition it back to this over the next 2 days. 10.Vitamin D 1000 units daily. 11.Zocor 40 mg each evening.  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  He has had a history of resistant Pseudomonal UTIs.  He presented to the [facility] Emergency Department with complaint of nausea and vomiting for 3 days.  He was unable to tolerate oral intake.  He had had some mild diffuse abdominal pain, but no specific focal nature to the pain. He had mild diarrhea at the onset.  He denied any dysuria.  His sugar was up to 400 on the morning of admission.  In the emergency room, he was found to have a bicarb of 20 with an elevated anion gap of 18 and a sugar of 300.  He was placed on an insulin drip for mild DKA and was given IV fluids.  He was admitted for further care.  HOSPITAL COURSE:  Dr. Claris Gower was treated with IV fluids and insulin. Any evidence of DKA resolved promptly.  He was placed on intravenous Rocephin initially and this was switched to cefepime given his history of resistant urinary tract infection.  He received a total of 4 days of IV antibiotics including 3 doses of IV cefepime.  By March 24, 2011, his urine culture result was finally back and it showed greater than 100,000 colonies of multiple morphotypes.  This leaves open the possibility that his acute illness may have been caused from a gastroenteritis or food poisoning as opposed to a true urinary tract infection at this time.  He was stable and felt better and on March 25, 2011, he was discharged home in stable condition.  DISCHARGE PHYSICAL EXAM:  VITAL SIGNS:  Temperature 97.7, afebrile, pulse 57, respiratory rate 18, blood pressure 130/84. GENERAL:  He was sitting on the side of the bed, in no acute distress, alert and oriented x4. LUNGS:  Clear to auscultation bilaterally with no wheezes, rales, or rhonchi. HEART:  Regular rate and rhythm with no murmur, rub, or gallop. ABDOMEN:  Soft, nontender, nondistended with no mass or hepatosplenomegaly. EXTREMITIES:  He moved his  extremities x4.  He had no edema. NEUROLOGICAL:  He was grossly neurologically intact.  DISCHARGE LABORATORY DATA:  White count 9.8, hemoglobin 12.3, platelet count 279,000.  Blood sugars range from 90-293.  Sodium 138, potassium 3.8, chloride 104, CO2 of 27, BUN 23, creatinine 1.74, GFR 38, glucose 110, calcium 9.0.  Blood cultures x2 remained negative.  DISCHARGE INSTRUCTIONS:  Tyrrell is to keep his followup visits with Dr. Waynard Edwards, Dr. Altheimer and Dr. Isabel Caprice as previously scheduled.  He is to follow a low-salt diabetic diet.  He is to increase his activity slowly. He is to call or return to the emergency room with any significant problems.     Berthe Oley A. Waynard Edwards, M.D.     MAP/MEDQ  D:  03/25/2011  T:  03/25/2011  Job:  [id number]  cc:   Valetta Fuller, MD Fax: [phone]  Veverly Fells. Altheimer, M.D. Fax: [phone]  Electronically Signed by Rodrigo Ran M.D. on 03/25/2011 09:49:21 AM

## 2011-03-29 LAB — CULTURE, BLOOD (ROUTINE X 2)
Culture  Setup Time: 201209030830
Culture  Setup Time: 201209030830
Culture: NO GROWTH

## 2011-03-31 NOTE — H&P (Signed)
Benjamin Riddle, Benjamin Riddle NO.:  [id number]  MEDICAL RECORD NO.:  [id number]  LOCATION:  1539                         FACILITY:  [facility]  PHYSICIAN:  Kari Baars, M.D.  DATE OF BIRTH:  [birth year]  DATE OF ADMISSION:  03/22/2011 DATE OF DISCHARGE:                             HISTORY & PHYSICAL   CHIEF COMPLAINT:  Nausea and vomiting for 3 days.  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  Patient states that he was in his usual state of health until about 2 weeks ago when he started to develop some vestibular symptoms.  He attributed these dizzy/vertigo type symptoms to a delayed effect from gentamicin injections that he received in June 2012.  He had had similar reaction in the past.  About a week ago, he also noticed that his sugars were starting to increase.  Over the past 3 days, he has had persistent nausea and vomiting with any p.o. intake including solids and liquids.  He has been unable to tolerate any p.o.'s.  He has had some mild diffuse abdominal pain, but no specific focality.  He had some mild diarrhea at the onset.  He has not had any significant dysuria, but does state that he is generally asymptomatic with his chronic urinary tract infections.  He has been treated in the past for his Pseudomonas UTI with gentamicin IM x6, most recently in June 2012.  He did receive one dose of Maxipime in [place] with resolution of his Pseudomonas followed by urine cultures and his most recent urine cultures have been negative.  He also states that his sugar was up to 400 this  morning despite compliance with his Lantus and Humalog insulin.  Upon presentation to the emergency department at [facility], he was found to have a sugar of about 300 with an elevated anion gap (18) with a bicarb of 20.  He was started on an insulin drip for mild DKA and his sugar has subsequently improved to 115 on insulin at 0.6 units per an hour and a D5 half normal saline drip at 125 cc an hour.  He states he is feeling some better with resolution of his nausea.  He has not yet had anything to eat.  REVIEW OF SYSTEMS:  All systems were reviewed with patient and are negative except in the HPI with following exceptions:  He has had some mild sweats with vomiting, but denies any fevers or chills.  Denies headache, neck stiffness, chest pain, cough, shortness of breath, or rash.  PAST MEDICAL HISTORY: 1. Diabetes mellitus with renal complications. 2. Hypertension. 3. Hyperlipidemia. 4. Hypergonadism. 5. Chronic renal insufficiency, stage III. 6. Possible adrenal insufficiency, ruled out recently. 7. Osteoporosis. 8. History of hydronephrosis secondary to bladder neck obstruction,     status post surgery in (1993) requiring self-catheterization twice  a day. 9. History of recurrent urinary tract infection with a resistant     Pseudomonas in the past. 10.Allergic rhinitis/mild asthma. 11.Hepatitis C secondary to blood transfusion. 12.Status post left inguinal hernia repair (1973). 13.Status post sinus surgery (1978).  CURRENT MEDICATIONS: 1. Lantus 17 units daily in the morning. 2. Zocor 40 mg daily. 3. Benicar 40 mg daily. 4. Advair 100/50 two puffs b.i.d. 5. Humalog 8 units with breakfast, 4 units with lunch, 8 to 10 units     with supper. 6. Benadryl q.h.s. 7. Aspirin sporadically.  ALLERGIES: 1. CODEINE. 2. CIPRO. 3. GENTAMICIN causes vestibular symptoms.  SOCIAL HISTORY:  He is married.  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  Rare alcohol use.  No tobacco or drug use.  FAMILY HISTORY:  Father died of COPD at 64 and had diabetes mellitus type 2.  His mother died at 70 from hypertension.  He has a sister with breast cancer and diabetes and a brother with lung disease.  PHYSICAL EXAMINATION:  VITAL SIGNS:  Temperature 97.7,  blood pressure 127/66, pulse 76, respirations 18, and oxygen saturation 99% on room air.  CBG currently 115 on an insulin drip at 0.6 units per an hour and D5 half normal saline at 125 cc an hour. GENERAL:  He is thin, healthy-appearing gentleman, in no acute distress. HEENT:  Oropharynx is dry.  Pupils are equal, round, and reactive to light.  No scleral icterus. NECK:  Supple without lymphadenopathy or JVD. HEART:  Regular rate and rhythm without murmurs, rubs, or gallops. LUNGS:  Clear to auscultation bilaterally. ABDOMEN:  Soft, nondistended, and nontender with normoactive bowel sounds.  He has no CVA tenderness. EXTREMITIES:  No clubbing, cyanosis, or edema. SKIN:  No rash.  LABORATORY DATA:  CBC shows a white count of 12.3 with 87% segs, 10% lymphs, hemoglobin 14.1, and platelets 350.  BMET shows sodium 138, potassium 4.4, chloride 100, bicarb 20, BUN 38, creatinine 2.0, glucose 280 (anion gap 18).  Urinalysis:  Too numerous to count white blood cells, 3 to 6 red blood cells, ketones 40, and glucose 500.  STUDIES:  Chest x-ray shows chronic changes with no acute cardiopulmonary disease.  EKG personally reviewed shows normal sinus rhythm with a normal EKG.  ASSESSMENT/PLAN: 1. Mild diabetic ketoacidosis - his elevated anion gap acidosis with     hyperglycemia despite compliance with his insulin is consistent     with diabetic ketoacidosis secondary to urinary tract     infection/nausea and vomiting.  His sugars have improved on an     insulin drip, currently at 0.6 units per an hour and he has been     started on D5 half normal  saline.  We will recheck his basic     metabolic panel and if his acidosis has resolved, we will change to     Lantus with Humalog prior to meals.  We will continue IV fluid     hydration. 2. Urinary tract infection - he does have a history of resistant     Pseudomonas.  We will obtain urine and blood cultures.  We will     start empiric coverage with Maxipime given his prior culture data     and transition to oral options if possible based on cultures.  We     will use Zofran as needed for his nausea. 3. Acute on chronic renal failure - this  is likely secondary to     prerenal azotemia.  He has stage III renal failure at baseline. 4. Bladder neck contracture with a history of hydronephrosis, continue     self-catheterization as needed. 5. Deep venous thrombosis prophylaxis with Lovenox. 6. Disposition - anticipate discharge in 2 to 3 days once his culture     data is available to direct antibiotic course and he is tolerating     p.o.'s.     Kari Baars, M.D.     WS/MEDQ  D:  03/22/2011  T:  03/23/2011  Job:  [id number]  cc:   Loraine Leriche A. Perini, M.D. Fax: [phone]  Valetta Fuller, MD Fax: [phone]  Veverly Fells. Altheimer, M.D. Fax: [phone]  Electronically Signed by Lacretia Nicks. Buren Kos M.D. on 03/31/2011 09:42:18 PM

## 2011-05-01 LAB — BASIC METABOLIC PANEL
Calcium: 9.3
GFR calc Af Amer: 43 — ABNORMAL LOW
GFR calc non Af Amer: 35 — ABNORMAL LOW
Glucose, Bld: 84
Potassium: 5.1
Sodium: 142

## 2011-05-01 LAB — CBC
HCT: 36.3 — ABNORMAL LOW
Hemoglobin: 12.3 — ABNORMAL LOW
RBC: 4.01 — ABNORMAL LOW
WBC: 10.3

## 2011-05-04 LAB — URINE CULTURE: Colony Count: >=100000

## 2011-05-04 LAB — CBC
Hemoglobin: 12.8 — ABNORMAL LOW
MCHC: 34.2
RBC: 4.14 — ABNORMAL LOW
RDW: 14.1 — ABNORMAL HIGH

## 2011-07-28 DIAGNOSIS — I10 Essential (primary) hypertension: Secondary | ICD-10-CM | POA: Diagnosis not present

## 2011-07-28 DIAGNOSIS — R0602 Shortness of breath: Secondary | ICD-10-CM | POA: Diagnosis not present

## 2011-07-28 DIAGNOSIS — E109 Type 1 diabetes mellitus without complications: Secondary | ICD-10-CM | POA: Diagnosis not present

## 2011-09-01 ENCOUNTER — Encounter: Payer: Self-pay | Admitting: Cardiology

## 2011-09-01 ENCOUNTER — Ambulatory Visit (INDEPENDENT_AMBULATORY_CARE_PROVIDER_SITE_OTHER)
Admission: RE | Admit: 2011-09-01 | Discharge: 2011-09-01 | Disposition: A | Discharge status: Home / Self Care | Payer: Medicare Other | Source: Ambulatory Visit | Attending: Cardiology | Admitting: Cardiology

## 2011-09-01 ENCOUNTER — Ambulatory Visit (INDEPENDENT_AMBULATORY_CARE_PROVIDER_SITE_OTHER): Payer: Medicare Other | Admitting: Cardiology

## 2011-09-01 VITALS — BP 138/80 | HR 90 | Ht 70.0 in | Wt 144.6 lb

## 2011-09-01 DIAGNOSIS — E785 Hyperlipidemia, unspecified: Secondary | ICD-10-CM

## 2011-09-01 DIAGNOSIS — E119 Type 2 diabetes mellitus without complications: Secondary | ICD-10-CM

## 2011-09-01 DIAGNOSIS — J45909 Unspecified asthma, uncomplicated: Secondary | ICD-10-CM | POA: Diagnosis not present

## 2011-09-01 DIAGNOSIS — R0600 Dyspnea, unspecified: Secondary | ICD-10-CM | POA: Insufficient documentation

## 2011-09-01 DIAGNOSIS — R0609 Other forms of dyspnea: Secondary | ICD-10-CM | POA: Diagnosis not present

## 2011-09-01 DIAGNOSIS — R0602 Shortness of breath: Secondary | ICD-10-CM | POA: Diagnosis not present

## 2011-09-01 DIAGNOSIS — R0989 Other specified symptoms and signs involving the circulatory and respiratory systems: Secondary | ICD-10-CM

## 2011-09-01 DIAGNOSIS — R918 Other nonspecific abnormal finding of lung field: Secondary | ICD-10-CM | POA: Diagnosis not present

## 2011-09-01 NOTE — Patient Instructions (Signed)
We will get a chest Xray today.  We will schedule you for an echocardiogram

## 2011-09-01 NOTE — Assessment & Plan Note (Addendum)
I think his symptoms of dyspnea are predominantly pulmonary related. He has had a recent upper respiratory infection. His lungs still sound quite congested. I recommended a chest x-ray today. We will schedule him for an echocardiogram. He may need pulmonary reevaluation. It is certainly possible that this could be an atypical presentation for ischemia but I would not pursue further evaluation with stress testing at this point until his respiratory status improved. I really see no evidence of congestive heart failure on his exam today.

## 2011-09-01 NOTE — Progress Notes (Signed)
Benjamin Riddle Date of Birth: May 10, 1928 Medical Record #[id number]  History of Present Illness: Benjamin Riddle is seen at the request of Dr. Waynard Edwards for evaluation of dyspnea. He is a pleasant 78 year old white male. He reports that over the past year he's had more shortness of breath. As a result his ability to play tennis has suffered. He went on a cruise in mid January and developed an acute bronchitis associated with asthma. He was treated with nebulizer therapy, cortisone, and antibiotics. Since then his breathing has been worse. He has been coughing up thick white phlegm. His blood sugars have been very elevated in the 400-500 range. He feels very weak and has lost 10 pounds. He denies any prior cardiac history except for history of rheumatic fever as a child. He has been told that he has a systolic murmur in the past. He has had insulin-dependent diabetes mellitus for the past 23 years. He also has a history of chronic kidney disease and hyperlipidemia. He does have a history of chronic pulmonary disease and has been evaluated by pulmonary in 2004 and again in 2009. Chest x-ray in September of this past year showed chronic scarring.  Current Outpatient Prescriptions on File Prior to Visit  Medication Sig Dispense Refill  . albuterol (PROVENTIL,VENTOLIN) 90 MCG/ACT inhaler Inhale 2 puffs into the lungs every 4 (four) hours as needed.        . Cholecalciferol (VITAMIN D) 1000 UNITS capsule Take 1,000 Units by mouth daily.        . diphenhydrAMINE (BENADRYL) 25 MG tablet Take 25 mg by mouth at bedtime.       . Fluticasone-Salmeterol (ADVAIR DISKUS) 100-50 MCG/DOSE AEPB Inhale 1 puff into the lungs 2 (two) times daily.        . insulin glargine (LANTUS) 100 UNIT/ML injection Inject 17 Units into the skin every morning.       . insulin lispro (HUMALOG) 100 UNIT/ML injection Inject 4-10 Units into the skin 4 (four) times daily - after meals and at bedtime.       Marland Kitchen olmesartan (BENICAR) 40 MG tablet  Take 40 mg by mouth daily. Per PCP      . simvastatin (ZOCOR) 40 MG tablet Take 40 mg by mouth daily.          Allergies  Allergen Reactions  . Gentamicin     Becomes dizzy and weak  . Ciprofloxacin Itching and Rash    Past Medical History  Diagnosis Date  . Diabetes mellitus   . Asthma   . Hydronephrosis   . Kidney disease     Stage III chronic kidney disease  . Hypertension   . Neurogenic bladder   . Osteoporosis   . Hypogonadism male   . Recurrent urinary tract infection     With resistent Pseudomonas  . Allergic rhinitis   . Hepatitis C     due to blood transfusion  . Hyperlipidemia   . SOB (shortness of breath)     Past Surgical History  Procedure Date  . Hernia repair   . Tonsillectomy   . Bladder repair     obstruction surgery  . Frontal sinus obliteration     sinus repaired with a plate    History  Smoking status  . Never Smoker   Smokeless tobacco  . Not on file    History  Alcohol Use No    Family History  Problem Relation Age of Onset  . Heart failure Mother   . Hypertension  Mother   . COPD Father   . Cancer Sister     Breast  . Diabetes Sister   . Lung disease Brother     Review of Systems: The review of systems is positive for history of hydronephrosis related to bladder outlet obstruction. He performs in and out of cast twice daily. He has a remote history of hepatitis C related to transfusion. All other systems were reviewed and are negative.  Physical Exam: BP 138/80  Pulse 90  Ht 5\' 10"  (1.778 m)  Wt 144 lb 9.6 oz (65.59 kg)  BMI 20.75 kg/m2 He is a very thin, elderly white male in no acute distress. He is normocephalic, atraumatic. Pupils are equal round and reactive to light and accommodation. Sclera are clear. Oropharynx is clear. He has a white film on his tongue. His neck is supple without bruits or adenopathy. His external jugular pulsation is prominent but is jugular venous pulses fairly flat. His lungs reveal bilateral  coarse breath sounds and rhonchi with scattered wheezes. Cardiac exam reveals a regular rate and rhythm without significant gallop, murmur, or click. Abdomen is thin, soft, nontender. He has no masses or hepatosplenomegaly. Femoral and pedal pulses are 2+. He has no edema. He is alert and oriented x3. Cranial nerves II through XII are intact. Skin is warm and dry. LABORATORY DATA: ECG today demonstrates normal sinus rhythm with a rate of 90 beats per minute and a normal ECG.  Assessment / Plan:

## 2011-09-07 DIAGNOSIS — E785 Hyperlipidemia, unspecified: Secondary | ICD-10-CM | POA: Diagnosis not present

## 2011-09-07 DIAGNOSIS — I129 Hypertensive chronic kidney disease with stage 1 through stage 4 chronic kidney disease, or unspecified chronic kidney disease: Secondary | ICD-10-CM | POA: Diagnosis not present

## 2011-09-07 DIAGNOSIS — R5382 Chronic fatigue, unspecified: Secondary | ICD-10-CM | POA: Diagnosis not present

## 2011-09-07 DIAGNOSIS — E1065 Type 1 diabetes mellitus with hyperglycemia: Secondary | ICD-10-CM | POA: Diagnosis not present

## 2011-09-08 ENCOUNTER — Other Ambulatory Visit: Payer: Self-pay

## 2011-09-08 ENCOUNTER — Ambulatory Visit (HOSPITAL_COMMUNITY): Payer: Medicare Other | Attending: Cardiology

## 2011-09-08 DIAGNOSIS — E119 Type 2 diabetes mellitus without complications: Secondary | ICD-10-CM | POA: Insufficient documentation

## 2011-09-08 DIAGNOSIS — R0600 Dyspnea, unspecified: Secondary | ICD-10-CM

## 2011-09-08 DIAGNOSIS — R0989 Other specified symptoms and signs involving the circulatory and respiratory systems: Secondary | ICD-10-CM | POA: Insufficient documentation

## 2011-09-08 DIAGNOSIS — I059 Rheumatic mitral valve disease, unspecified: Secondary | ICD-10-CM | POA: Diagnosis not present

## 2011-09-08 DIAGNOSIS — I379 Nonrheumatic pulmonary valve disorder, unspecified: Secondary | ICD-10-CM | POA: Diagnosis not present

## 2011-09-08 DIAGNOSIS — I079 Rheumatic tricuspid valve disease, unspecified: Secondary | ICD-10-CM | POA: Diagnosis not present

## 2011-09-08 DIAGNOSIS — R0609 Other forms of dyspnea: Secondary | ICD-10-CM | POA: Diagnosis not present

## 2011-09-08 DIAGNOSIS — E785 Hyperlipidemia, unspecified: Secondary | ICD-10-CM

## 2011-09-11 DIAGNOSIS — I129 Hypertensive chronic kidney disease with stage 1 through stage 4 chronic kidney disease, or unspecified chronic kidney disease: Secondary | ICD-10-CM | POA: Diagnosis present

## 2011-09-11 DIAGNOSIS — N319 Neuromuscular dysfunction of bladder, unspecified: Secondary | ICD-10-CM | POA: Diagnosis present

## 2011-09-11 DIAGNOSIS — N183 Chronic kidney disease, stage 3 unspecified: Secondary | ICD-10-CM | POA: Diagnosis present

## 2011-09-11 DIAGNOSIS — N400 Enlarged prostate without lower urinary tract symptoms: Secondary | ICD-10-CM | POA: Diagnosis present

## 2011-09-11 DIAGNOSIS — R0602 Shortness of breath: Secondary | ICD-10-CM | POA: Diagnosis not present

## 2011-09-11 DIAGNOSIS — E119 Type 2 diabetes mellitus without complications: Secondary | ICD-10-CM | POA: Diagnosis present

## 2011-09-11 DIAGNOSIS — E875 Hyperkalemia: Secondary | ICD-10-CM | POA: Diagnosis present

## 2011-09-11 DIAGNOSIS — J441 Chronic obstructive pulmonary disease with (acute) exacerbation: Principal | ICD-10-CM | POA: Diagnosis present

## 2011-09-11 DIAGNOSIS — E785 Hyperlipidemia, unspecified: Secondary | ICD-10-CM | POA: Diagnosis present

## 2011-09-11 DIAGNOSIS — E669 Obesity, unspecified: Secondary | ICD-10-CM | POA: Diagnosis present

## 2011-09-11 DIAGNOSIS — R05 Cough: Secondary | ICD-10-CM | POA: Diagnosis not present

## 2011-09-11 DIAGNOSIS — B1920 Unspecified viral hepatitis C without hepatic coma: Secondary | ICD-10-CM | POA: Diagnosis present

## 2011-09-11 DIAGNOSIS — M810 Age-related osteoporosis without current pathological fracture: Secondary | ICD-10-CM | POA: Diagnosis present

## 2011-09-11 DIAGNOSIS — H543 Unqualified visual loss, both eyes: Secondary | ICD-10-CM | POA: Diagnosis present

## 2011-09-11 DIAGNOSIS — N1330 Unspecified hydronephrosis: Secondary | ICD-10-CM | POA: Diagnosis present

## 2011-09-11 MED ORDER — IPRATROPIUM BROMIDE 0.02 % IN SOLN
RESPIRATORY_TRACT | Status: AC
  Administered 2011-09-11
  Filled 2011-09-11: qty 2.5

## 2011-09-11 MED ORDER — ALBUTEROL SULFATE (5 MG/ML) 0.5% IN NEBU
INHALATION_SOLUTION | RESPIRATORY_TRACT | Status: AC
  Filled 2011-09-11: qty 2

## 2011-09-11 NOTE — ED Notes (Signed)
Patient brought in from home by EMS with c/o increased SOB x 1 day accompanied with audible wheezing and productive cough x 2 days.

## 2011-09-11 NOTE — ED Notes (Signed)
Hx of asthma; COPD; DM

## 2011-09-12 ENCOUNTER — Inpatient Hospital Stay (HOSPITAL_COMMUNITY)
Admission: EM | Admit: 2011-09-12 | Discharge: 2011-09-14 | DRG: 191 | Disposition: A | Discharge status: Home / Self Care | Payer: Medicare Other | Attending: Family Medicine | Admitting: Family Medicine

## 2011-09-12 ENCOUNTER — Emergency Department (HOSPITAL_COMMUNITY): Payer: Medicare Other

## 2011-09-12 ENCOUNTER — Encounter (HOSPITAL_COMMUNITY): Payer: Self-pay | Admitting: *Deleted

## 2011-09-12 ENCOUNTER — Other Ambulatory Visit: Payer: Self-pay

## 2011-09-12 DIAGNOSIS — Z87898 Personal history of other specified conditions: Secondary | ICD-10-CM | POA: Diagnosis present

## 2011-09-12 DIAGNOSIS — R0600 Dyspnea, unspecified: Secondary | ICD-10-CM | POA: Diagnosis present

## 2011-09-12 DIAGNOSIS — E119 Type 2 diabetes mellitus without complications: Secondary | ICD-10-CM | POA: Diagnosis present

## 2011-09-12 DIAGNOSIS — B1920 Unspecified viral hepatitis C without hepatic coma: Secondary | ICD-10-CM | POA: Diagnosis present

## 2011-09-12 DIAGNOSIS — IMO0001 Reserved for inherently not codable concepts without codable children: Secondary | ICD-10-CM | POA: Diagnosis not present

## 2011-09-12 DIAGNOSIS — N319 Neuromuscular dysfunction of bladder, unspecified: Secondary | ICD-10-CM | POA: Diagnosis present

## 2011-09-12 DIAGNOSIS — E875 Hyperkalemia: Secondary | ICD-10-CM | POA: Diagnosis present

## 2011-09-12 DIAGNOSIS — N400 Enlarged prostate without lower urinary tract symptoms: Secondary | ICD-10-CM | POA: Diagnosis present

## 2011-09-12 DIAGNOSIS — N183 Chronic kidney disease, stage 3 unspecified: Secondary | ICD-10-CM | POA: Diagnosis present

## 2011-09-12 DIAGNOSIS — N1330 Unspecified hydronephrosis: Secondary | ICD-10-CM | POA: Diagnosis present

## 2011-09-12 DIAGNOSIS — J441 Chronic obstructive pulmonary disease with (acute) exacerbation: Secondary | ICD-10-CM

## 2011-09-12 DIAGNOSIS — M810 Age-related osteoporosis without current pathological fracture: Secondary | ICD-10-CM | POA: Diagnosis present

## 2011-09-12 DIAGNOSIS — I10 Essential (primary) hypertension: Secondary | ICD-10-CM | POA: Diagnosis not present

## 2011-09-12 DIAGNOSIS — E785 Hyperlipidemia, unspecified: Secondary | ICD-10-CM | POA: Diagnosis present

## 2011-09-12 DIAGNOSIS — I129 Hypertensive chronic kidney disease with stage 1 through stage 4 chronic kidney disease, or unspecified chronic kidney disease: Secondary | ICD-10-CM | POA: Diagnosis present

## 2011-09-12 DIAGNOSIS — J449 Chronic obstructive pulmonary disease, unspecified: Secondary | ICD-10-CM | POA: Diagnosis not present

## 2011-09-12 DIAGNOSIS — Z8619 Personal history of other infectious and parasitic diseases: Secondary | ICD-10-CM | POA: Diagnosis present

## 2011-09-12 DIAGNOSIS — E669 Obesity, unspecified: Secondary | ICD-10-CM | POA: Diagnosis present

## 2011-09-12 DIAGNOSIS — J45909 Unspecified asthma, uncomplicated: Secondary | ICD-10-CM | POA: Diagnosis present

## 2011-09-12 DIAGNOSIS — R059 Cough, unspecified: Secondary | ICD-10-CM | POA: Diagnosis not present

## 2011-09-12 DIAGNOSIS — R05 Cough: Secondary | ICD-10-CM | POA: Diagnosis not present

## 2011-09-12 DIAGNOSIS — H543 Unqualified visual loss, both eyes: Secondary | ICD-10-CM | POA: Diagnosis present

## 2011-09-12 LAB — GLUCOSE, CAPILLARY: Glucose-Capillary: 367 mg/dL — ABNORMAL HIGH (ref 70–99)

## 2011-09-12 LAB — DIFFERENTIAL
Basophils Absolute: 0.1 10*3/uL (ref 0.0–0.1)
Basophils Relative: 1 % (ref 0–1)
Eosinophils Absolute: 0.4 10*3/uL (ref 0.0–0.7)
Eosinophils Relative: 4 % (ref 0–5)
Monocytes Absolute: 1.1 10*3/uL — ABNORMAL HIGH (ref 0.1–1.0)
Monocytes Relative: 9 % (ref 3–12)

## 2011-09-12 LAB — BASIC METABOLIC PANEL
BUN: 28 mg/dL — ABNORMAL HIGH (ref 6–23)
Calcium: 9.2 mg/dL (ref 8.4–10.5)
Creatinine, Ser: 1.86 mg/dL — ABNORMAL HIGH (ref 0.50–1.35)
GFR calc Af Amer: 37 mL/min — ABNORMAL LOW (ref >90)
GFR calc non Af Amer: 32 mL/min — ABNORMAL LOW (ref >90)

## 2011-09-12 LAB — CBC
HCT: 39.7 % (ref 39.0–52.0)
Hemoglobin: 13.3 g/dL (ref 13.0–17.0)
MCH: 30.5 pg (ref 26.0–34.0)
MCHC: 33.5 g/dL (ref 30.0–36.0)
RDW: 13.8 % (ref 11.5–15.5)

## 2011-09-12 LAB — CARDIAC PANEL(CRET KIN+CKTOT+MB+TROPI)
Relative Index: INVALID (ref 0.0–2.5)
Total CK: 92 U/L (ref 7–232)

## 2011-09-12 MED ORDER — SODIUM CHLORIDE 0.9 % IJ SOLN
3.0000 mL | Freq: Two times a day (BID) | INTRAMUSCULAR | Status: DC
  Administered 2011-09-12 – 2011-09-14 (×4): 3 mL via INTRAVENOUS

## 2011-09-12 MED ORDER — INSULIN ASPART 100 UNIT/ML ~~LOC~~ SOLN
SUBCUTANEOUS | Status: AC
  Filled 2011-09-12: qty 1

## 2011-09-12 MED ORDER — ALBUTEROL SULFATE (5 MG/ML) 0.5% IN NEBU
2.5000 mg | INHALATION_SOLUTION | RESPIRATORY_TRACT | Status: DC | PRN
  Administered 2011-09-12 – 2011-09-13 (×3): 2.5 mg via RESPIRATORY_TRACT
  Filled 2011-09-12 (×2): qty 0.5

## 2011-09-12 MED ORDER — SODIUM CHLORIDE 0.9 % IV SOLN
INTRAVENOUS | Status: AC
  Administered 2011-09-12: 04:00:00 via INTRAVENOUS

## 2011-09-12 MED ORDER — SODIUM CHLORIDE 0.9 % IJ SOLN: 3.0000 mL | INTRAMUSCULAR | Status: DC | PRN

## 2011-09-12 MED ORDER — IPRATROPIUM BROMIDE HFA 17 MCG/ACT IN AERS
2.0000 | INHALATION_SPRAY | Freq: Four times a day (QID) | RESPIRATORY_TRACT | Status: DC | PRN
  Filled 2011-09-12: qty 12.9

## 2011-09-12 MED ORDER — IRBESARTAN 300 MG PO TABS
300.0000 mg | ORAL_TABLET | Freq: Every day | ORAL | Status: DC
  Administered 2011-09-12 – 2011-09-13 (×2): 300 mg via ORAL
  Filled 2011-09-12 (×3): qty 1

## 2011-09-12 MED ORDER — DEXTROSE 5 % IV SOLN
500.0000 mg | Freq: Every day | INTRAVENOUS | Status: DC
  Administered 2011-09-12: 500 mg via INTRAVENOUS
  Filled 2011-09-12 (×2): qty 500

## 2011-09-12 MED ORDER — SIMVASTATIN 40 MG PO TABS
40.0000 mg | ORAL_TABLET | Freq: Every day | ORAL | Status: DC
  Administered 2011-09-12 – 2011-09-14 (×3): 40 mg via ORAL
  Filled 2011-09-12 (×3): qty 1

## 2011-09-12 MED ORDER — INSULIN ASPART 100 UNIT/ML ~~LOC~~ SOLN
6.0000 [IU] | Freq: Three times a day (TID) | SUBCUTANEOUS | Status: DC
  Administered 2011-09-12 – 2011-09-14 (×7): 6 [IU] via SUBCUTANEOUS
  Filled 2011-09-12: qty 3

## 2011-09-12 MED ORDER — INSULIN GLARGINE 100 UNIT/ML ~~LOC~~ SOLN
20.0000 [IU] | Freq: Two times a day (BID) | SUBCUTANEOUS | Status: DC
  Administered 2011-09-12 – 2011-09-13 (×3): 20 [IU] via SUBCUTANEOUS
  Filled 2011-09-12: qty 3

## 2011-09-12 MED ORDER — BIOTENE DRY MOUTH MT LIQD
15.0000 mL | Freq: Two times a day (BID) | OROMUCOSAL | Status: DC
  Administered 2011-09-12 – 2011-09-14 (×5): 15 mL via OROMUCOSAL

## 2011-09-12 MED ORDER — INSULIN ASPART 100 UNIT/ML ~~LOC~~ SOLN
0.0000 [IU] | Freq: Every day | SUBCUTANEOUS | Status: DC
  Administered 2011-09-12 – 2011-09-13 (×2): 4 [IU] via SUBCUTANEOUS
  Filled 2011-09-12: qty 3

## 2011-09-12 MED ORDER — ALBUTEROL SULFATE (5 MG/ML) 0.5% IN NEBU
5.0000 mg | INHALATION_SOLUTION | Freq: Once | RESPIRATORY_TRACT | Status: AC
  Administered 2011-09-11: via RESPIRATORY_TRACT
  Administered 2011-09-12: 5 mg via RESPIRATORY_TRACT
  Filled 2011-09-12: qty 1

## 2011-09-12 MED ORDER — PREDNISONE 20 MG PO TABS
60.0000 mg | ORAL_TABLET | Freq: Once | ORAL | Status: AC
  Administered 2011-09-12: 60 mg via ORAL
  Filled 2011-09-12: qty 3

## 2011-09-12 MED ORDER — METHYLPREDNISOLONE SODIUM SUCC 125 MG IJ SOLR
60.0000 mg | INTRAMUSCULAR | Status: DC
  Administered 2011-09-12 (×2): 60 mg via INTRAVENOUS
  Filled 2011-09-12 (×7): qty 0.96

## 2011-09-12 MED ORDER — AZITHROMYCIN 500 MG PO TABS
500.0000 mg | ORAL_TABLET | Freq: Every day | ORAL | Status: DC
  Administered 2011-09-12 – 2011-09-14 (×3): 500 mg via ORAL
  Filled 2011-09-12 (×3): qty 1

## 2011-09-12 MED ORDER — ALBUTEROL SULFATE (5 MG/ML) 0.5% IN NEBU
2.5000 mg | INHALATION_SOLUTION | Freq: Four times a day (QID) | RESPIRATORY_TRACT | Status: DC
  Administered 2011-09-12 – 2011-09-14 (×6): 2.5 mg via RESPIRATORY_TRACT
  Filled 2011-09-12 (×10): qty 0.5

## 2011-09-12 MED ORDER — INSULIN ASPART 100 UNIT/ML ~~LOC~~ SOLN
0.0000 [IU] | Freq: Three times a day (TID) | SUBCUTANEOUS | Status: DC
  Administered 2011-09-12 (×2): 20 [IU] via SUBCUTANEOUS
  Administered 2011-09-12: 11 [IU] via SUBCUTANEOUS
  Administered 2011-09-13: 3 [IU] via SUBCUTANEOUS
  Administered 2011-09-13: 20 [IU] via SUBCUTANEOUS
  Administered 2011-09-13: 4 [IU] via SUBCUTANEOUS
  Filled 2011-09-12: qty 3

## 2011-09-12 MED ORDER — SODIUM CHLORIDE 0.9 % IV SOLN: 250.0000 mL | INTRAVENOUS | Status: DC | PRN

## 2011-09-12 MED ORDER — INSULIN ASPART 100 UNIT/ML ~~LOC~~ SOLN
10.0000 [IU] | Freq: Once | SUBCUTANEOUS | Status: AC
  Administered 2011-09-12: 10 [IU] via SUBCUTANEOUS

## 2011-09-12 MED ORDER — ALBUTEROL SULFATE (5 MG/ML) 0.5% IN NEBU: 5.0000 mg | INHALATION_SOLUTION | RESPIRATORY_TRACT | Status: DC | PRN

## 2011-09-12 MED ORDER — VITAMIN D 1000 UNITS PO TABS
1000.0000 [IU] | ORAL_TABLET | Freq: Every day | ORAL | Status: DC
  Administered 2011-09-12 – 2011-09-14 (×3): 1000 [IU] via ORAL
  Filled 2011-09-12 (×3): qty 1

## 2011-09-12 MED ORDER — PREDNISONE 50 MG PO TABS
60.0000 mg | ORAL_TABLET | Freq: Every day | ORAL | Status: DC
  Administered 2011-09-13 – 2011-09-14 (×2): 60 mg via ORAL
  Filled 2011-09-12 (×2): qty 1

## 2011-09-12 NOTE — ED Notes (Signed)
Patient reports increased SOB since yesterday evening accompanied with productive cough. Patient received 1 Albuterol/Atrovent nebulizer en route with improvement in symptoms. Patient now placed on 2L [place] with O2 saturation at 96%. Denies pain at this time.

## 2011-09-12 NOTE — ED Notes (Signed)
Patient transported to X-ray 

## 2011-09-12 NOTE — Progress Notes (Signed)
10:07 AM I agree with HPI/GPe and A/P per Dr. Theron Arista LE      Patient states feeling well. No specific complaint. Not significantly short of breath, but is having some mild yellow productive sputum. Denies any chest pain. Denies any nausea vomiting or other weakness.  HEENT-no pallor no icterus, frail-looking male CHEST-decreased air entry to lower bases of posterior lungs however no wheezes elicited no tactile vocal resonance or fremitus CARDIAC-S1-S2 no murmur rub or gallop, no JVD no carotid bruit ABDOMEN-soft nontender nondistended NEURO- moves all 4 limbs equally. Strength grossly intact  Patient Active Problem List  Diagnoses  . COPD exacerbation-wean IV steroids to oral prednisone for 4 days, continue albuterol 2.5 mg nebs every 2 when necessary, add Atrovent 2 puffs every 6 when necessary.  Switched to oral azithromycin 500 by mouth x4 daysm, Suspect he will get better over the next 1-2 days. He is very active at baseline and unlikely will need physical therapy to help him in the   . DIABETES MELLITUS, TYPE II-blood sugars as high as 538 this morning. Continue resistant sliding scale insulin. Continue Lantus 20 units   . DYSLIPIDEMIA-continue Zocor 40 mg daily   . HYPERTENSION-continue Avapro 300 mg daily   . EOSINOPHILIC PNEUMONIA-history of this several years ago. In active problem   . HYDRONEPHROSIS, BILATERAL-secondary to bladder outlet obstruction stable   . HEPATITIS, HX OF C-stable at present time, as has not had LFTs in the recent past we'll add this to tomorrow morning's lab   . BENIGN PROSTATIC HYPERTROPHY, HX OF-currently not on medication for this. Outpatient reevaluation   .   Marland Kitchen Neurogenic bladder-reevaluate as needed

## 2011-09-12 NOTE — ED Provider Notes (Addendum)
History     CSN: [id number]  Arrival date & time 09/11/11  2335   First MD Initiated Contact with Patient 09/12/11 0034      Chief Complaint  Patient presents with  . Shortness of Breath    (Consider location/radiation/quality/duration/timing/severity/associated sxs/prior treatment) HPI Comments: Patient with a history of asthma and COPD was brought in by EMS today with complaints of shortness of breath. He states it started yesterday and got worse today. He says it feels like a past acinar type exacerbation. He has had a productive cough for the last 2 days which has been productive of yellow white sputum. Denies being chest pain. Denies any nausea vomiting or diarrhea. Denies any fevers. He was given nebulizer treatments per EMS he does feel better now although still feels somewhat short of breath  Patient is a 78 y.o. male presenting with shortness of breath. The history is provided by the patient.  Shortness of Breath  The current episode started yesterday. Associated symptoms include shortness of breath and wheezing. Pertinent negatives include no chest pain, no fever, no rhinorrhea and no cough.    Past Medical History  Diagnosis Date  . Diabetes mellitus   . Asthma   . Hydronephrosis   . Kidney disease     Stage III chronic kidney disease  . Hypertension   . Neurogenic bladder   . Osteoporosis   . Hypogonadism male   . Recurrent urinary tract infection     With resistent Pseudomonas  . Allergic rhinitis   . Hepatitis C     due to blood transfusion  . Hyperlipidemia   . SOB (shortness of breath)     Past Surgical History  Procedure Date  . Hernia repair   . Tonsillectomy   . Bladder repair     obstruction surgery  . Frontal sinus obliteration     sinus repaired with a plate    Family History  Problem Relation Age of Onset  . Heart failure Mother   . Hypertension Mother   . COPD Father   . Cancer Sister     Breast  . Diabetes Sister   . Lung disease  Brother     History  Substance Use Topics  . Smoking status: Never Smoker   . Smokeless tobacco: Not on file  . Alcohol Use: No      Review of Systems  Constitutional: Negative for fever, chills, diaphoresis and fatigue.  HENT: Negative for congestion, rhinorrhea and sneezing.   Eyes: Negative.   Respiratory: Positive for shortness of breath and wheezing. Negative for cough and chest tightness.   Cardiovascular: Negative for chest pain and leg swelling.  Gastrointestinal: Negative for nausea, vomiting, abdominal pain, diarrhea and blood in stool.  Genitourinary: Negative for frequency, hematuria, flank pain and difficulty urinating.  Musculoskeletal: Negative for back pain and arthralgias.  Skin: Negative for rash.  Neurological: Negative for dizziness, speech difficulty, weakness, numbness and headaches.    Allergies  Codeine; Gentamicin; and Ciprofloxacin  Home Medications   Current Outpatient Rx  Name Route Sig Dispense Refill  . ALBUTEROL 90 MCG/ACT IN AERS Inhalation Inhale 2 puffs into the lungs every 4 (four) hours as needed.      Marland Kitchen VITAMIN D 1000 UNITS PO CAPS Oral Take 1,000 Units by mouth daily.      Marland Kitchen FLUTICASONE-SALMETEROL 100-50 MCG/DOSE IN AEPB Inhalation Inhale 1 puff into the lungs 2 (two) times daily.      . INSULIN GLARGINE 100 UNIT/ML [place] SOLN  Subcutaneous Inject 20 Units into the skin 2 (two) times daily.     . INSULIN LISPRO (HUMAN) 100 UNIT/ML [place] SOLN Subcutaneous Inject 4-10 Units into the skin 4 (four) times daily - after meals and at bedtime.     Marland Kitchen OLMESARTAN MEDOXOMIL 40 MG PO TABS Oral Take 40 mg by mouth daily. Per PCP    . SIMVASTATIN 40 MG PO TABS Oral Take 40 mg by mouth daily.        BP 178/93  Pulse 108  Temp(Src) 98.4 F (36.9 C) (Axillary)  Resp 22  SpO2 100%  Physical Exam  Constitutional: He is oriented to person, place, and time. He appears well-developed and well-nourished.  HENT:  Head: Normocephalic and atraumatic.  Eyes:  Pupils are equal, round, and reactive to light.  Neck: Normal range of motion. Neck supple.  Cardiovascular: Normal rate, regular rhythm and normal heart sounds.   Pulmonary/Chest: Effort normal. No respiratory distress. He has wheezes. He has no rales. He exhibits no tenderness.       Mild wheezes throughout with some decreased breath sounds bilaterally. No accessory muscle use. A shortness talking in full sentences  Abdominal: Soft. Bowel sounds are normal. There is no tenderness. There is no rebound and no guarding.  Musculoskeletal: Normal range of motion. He exhibits no edema.  Lymphadenopathy:    He has no cervical adenopathy.  Neurological: He is alert and oriented to person, place, and time.  Skin: Skin is warm and dry. No rash noted.  Psychiatric: He has a normal mood and affect.    ED Course  Procedures (including critical care time)   Date: 09/12/2011  Rate: 101  Rhythm: normal sinus rhythm  QRS Axis: normal  Intervals: PR prolonged  ST/T Wave abnormalities: normal  Conduction Disutrbances:first-degree A-V block   Narrative Interpretation:   Old EKG Reviewed: unchanged, other than PR increased   Results for orders placed during the hospital encounter of 09/12/11  CBC      Component Value Range   WBC 11.8 (*) 4.0 - 10.5 (K/uL)   RBC 4.36  4.22 - 5.81 (MIL/uL)   Hemoglobin 13.3  13.0 - 17.0 (g/dL)   HCT 47.8  29.5 - 62.1 (%)   MCV 91.1  78.0 - 100.0 (fL)   MCH 30.5  26.0 - 34.0 (pg)   MCHC 33.5  30.0 - 36.0 (g/dL)   RDW 30.8  65.7 - 84.6 (%)   Platelets 340  150 - 400 (K/uL)  DIFFERENTIAL      Component Value Range   Neutrophils Relative 75  43 - 77 (%)   Neutro Abs 8.8 (*) 1.7 - 7.7 (K/uL)   Lymphocytes Relative 12  12 - 46 (%)   Lymphs Abs 1.4  0.7 - 4.0 (K/uL)   Monocytes Relative 9  3 - 12 (%)   Monocytes Absolute 1.1 (*) 0.1 - 1.0 (K/uL)   Eosinophils Relative 4  0 - 5 (%)   Eosinophils Absolute 0.4  0.0 - 0.7 (K/uL)   Basophils Relative 1  0 - 1 (%)    Basophils Absolute 0.1  0.0 - 0.1 (K/uL)  BASIC METABOLIC PANEL      Component Value Range   Sodium 135  135 - 145 (mEq/L)   Potassium 4.7  3.5 - 5.1 (mEq/L)   Chloride 100  96 - 112 (mEq/L)   CO2 24  19 - 32 (mEq/L)   Glucose, Bld 293 (*) 70 - 99 (mg/dL)   BUN 28 (*)  6 - 23 (mg/dL)   Creatinine, Ser 7.82 (*) 0.50 - 1.35 (mg/dL)   Calcium 9.2  8.4 - 95.6 (mg/dL)   GFR calc non Af Amer 32 (*) >90 (mL/min)   GFR calc Af Amer 37 (*) >90 (mL/min)  CARDIAC PANEL(CRET KIN+CKTOT+MB+TROPI)      Component Value Range   Total CK 92  7 - 232 (U/L)   CK, MB 3.7  0.3 - 4.0 (ng/mL)   Troponin I <0.30  <0.30 (ng/mL)   Relative Index RELATIVE INDEX IS INVALID  0.0 - 2.5    Dg Chest 2 View  09/12/2011  *RADIOLOGY REPORT*  Clinical Data: Breath, cough.  CHEST - 2 VIEW  Comparison: 09/01/2011  Findings: Heart and mediastinal contours are within normal limits. No focal opacities or effusions.  No acute bony abnormality. Degenerative changes in the thoracic spine.  IMPRESSION: No active cardiopulmonary disease.  Original Report Authenticated By: Cyndie Chime, M.D.   Dg Chest 2 View  09/01/2011  *RADIOLOGY REPORT*  Clinical Data: Shortness of breath.  Coughing.  Ex-smoker . Asthma.  CHEST - 2 VIEW  Comparison: 03/22/2011.  Findings: Cardiac silhouette is normal size and shape.  There is ectasia of thoracic aorta.  Mediastinal and hilar contours appear stable.  There is stable apical pleural and parenchymal pulmonary fibrotic change present.  There is slight elevation of left hilum consistent with contraction fibrosis.  Areas of emphysematous changes are seen.  No pulmonary edema, pneumonia, or pleural effusion is evident.  There is minimal central peribronchial thickening.  Syndesmophytes are seen in the spine.  IMPRESSION: Hyperinflation configuration consistent with COPD.  Fibrotic changes are detailed above.  No pulmonary edema, pneumonia, or pleural effusion.  Minimal central peribronchial thickening.  This  may be associated with bronchitis, asthma, and reactive airway disease.  Original Report Authenticated By: Crawford Givens, M.D.     1. COPD exacerbation       MDM  Pt with COPD exacerbation, given 2 nebs per EMS, on third now, still wheezing, now with slight increased WOB.  Will consult Hospitalist for admission (pt goes to Dr Waynard Edwards).  Nothing to suggest ACS/CHF.  No pain to suggest PE.  No evidence of pneumonia.  Given prednisone, will continue nebs, admit        Rolan Bucco, MD 09/12/11 2130  Rolan Bucco, MD 09/12/11 [phone]

## 2011-09-12 NOTE — ED Notes (Signed)
ZOX:[id number]<AV> Expected date:09/11/11<BR> Expected time:11:29 PM<BR> Means of arrival:Ambulance<BR> Comments:<BR> EMS 50 GC - asthma

## 2011-09-12 NOTE — H&P (Signed)
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 long emergency room complaining of aggressive wheezing, yellow sputum productive cough, general malaise, but no signs symptoms of congestive heart failure. Evaluation in emergency room included a chest x-ray which shows evidence of emphysema but no definite infiltrate, elevated blood glucose to 293, creatinine of 1.86, with negative cardiac markers. He was given by mouth prednisone, along with 3 nebulizer treatment, and hospitalist was asked to admit him for COPD exacerbation.  Rewiew of Systems:  The patient denies anorexia, fever, weight loss,, vision loss, decreased hearing, hoarseness, chest pain, syncope, , peripheral edema, balance deficits, hemoptysis, abdominal pain, melena, hematochezia, severe indigestion/heartburn, hematuria, incontinence, genital sores, muscle weakness, suspicious skin lesions, transient blindness, difficulty walking, depression, unusual weight change, abnormal bleeding, enlarged lymph nodes, angioedema, and breast masses.   Past Medical History  Diagnosis Date  . Diabetes mellitus   . Asthma   . Hydronephrosis   . Kidney disease     Stage III chronic kidney disease  . Hypertension   . Neurogenic bladder   . Osteoporosis   . Hypogonadism male   . Recurrent urinary tract infection     With resistent Pseudomonas  . Allergic rhinitis   . Hepatitis C     due to blood transfusion  . Hyperlipidemia   . SOB (shortness of breath)     Past Surgical History  Procedure Date  . Hernia repair   . Tonsillectomy   . Bladder repair     obstruction surgery  . Frontal sinus obliteration     sinus  repaired with a plate    Medications:  HOME MEDS: Prior to Admission medications   Medication Sig Start Date End Date Taking? Authorizing Provider  albuterol (PROVENTIL,VENTOLIN) 90 MCG/ACT inhaler Inhale 2 puffs into the lungs every 4 (four) hours as needed.     Yes Historical Provider, MD  Cholecalciferol (VITAMIN D) 1000 UNITS capsule Take 1,000 Units by mouth daily.     Yes Historical Provider, MD  Fluticasone-Salmeterol (ADVAIR DISKUS) 100-50 MCG/DOSE AEPB Inhale 1 puff into the lungs 2 (two) times daily.     Yes Historical Provider, MD  insulin glargine (LANTUS) 100 UNIT/ML injection Inject 20 Units into the skin 2 (two) times daily.    Yes Historical Provider, MD  insulin lispro (HUMALOG) 100 UNIT/ML injection Inject 4-10 Units into the skin 4 (four) times daily - after meals and at bedtime.    Yes Historical Provider, MD  olmesartan (BENICAR) 40 MG tablet Take 40 mg by mouth daily. Per PCP   Yes Historical Provider, MD  simvastatin (ZOCOR) 40 MG tablet Take 40 mg by mouth daily.     Yes Historical Provider, MD     Allergies:  Allergies  Allergen Reactions  . Codeine Nausea And Vomiting  . Gentamicin     Becomes dizzy and weak  . Ciprofloxacin Itching and Rash    Social History:   reports that he has never smoked. He does not have any smokeless tobacco history on file. He reports that he does not drink alcohol or use illicit drugs.  Family History: Family History  Problem Relation Age of Onset  . Heart failure Mother   .  Hypertension Mother   . COPD Father   . Cancer Sister     Breast  . Diabetes Sister   . Lung disease Brother      Physical Exam: Filed Vitals:   09/12/11 0008 09/12/11 0355 09/12/11 0403 09/12/11 0446  BP:   152/90 164/80  Pulse:   99 101  Temp:   98.7 F (37.1 C) 98.6 F (37 C)  TempSrc:   Oral Oral  Resp:   22 20  Height:    5\' 10"  (1.778 m)  Weight:    65.545 kg (144 lb 8 oz)  SpO2: 100% 96% 98% 94%   Blood pressure 164/80, pulse  101, temperature 98.6 F (37 C), temperature source Oral, resp. rate 20, height 5\' 10"  (1.778 m), weight 65.545 kg (144 lb 8 oz), SpO2 94.00%.  GEN:  Pleasant person lying in the stretcher in no acute distress; cooperative with exam PSYCH:  alert and oriented x4; does not appear anxious does not appear depressed; affect is normal HEENT: Mucous membranes pink and anicteric; PERRLA; EOM intact; no cervical lymphadenopathy nor thyromegaly or carotid bruit; no JVD; Breasts:: Not examined CHEST WALL: No tenderness CHEST: He had decreased breath sound throughout. He has tight wheezes both inspiratory and expiratory. There's no crackles. HEART: Regular rate and rhythm; no murmurs rubs or gallops BACK: No kyphosis or scoliosis; no CVA tenderness ABDOMEN: Obese, soft non-tender; no masses, no organomegaly, normal abdominal bowel sounds; no pannus; no intertriginous candida. Rectal Exam: Not done EXTREMITIES: No bone or joint deformity; age-appropriate arthropathy of the hands and knees; no edema; no ulcerations. Genitalia: not examined PULSES: 2+ and symmetric SKIN: Normal hydration no rash or ulceration. There is no central nor peripheral cyanosis. CNS: Cranial nerves 2-12 grossly intact no focal neurologic deficit   Labs & Imaging Results for orders placed during the hospital encounter of 09/12/11 (from the past 48 hour(s))  CBC     Status: Abnormal   Collection Time   09/12/11  1:19 AM      Component Value Range Comment   WBC 11.8 (*) 4.0 - 10.5 (K/uL)    RBC 4.36  4.22 - 5.81 (MIL/uL)    Hemoglobin 13.3  13.0 - 17.0 (g/dL)    HCT 91.4  78.2 - 95.6 (%)    MCV 91.1  78.0 - 100.0 (fL)    MCH 30.5  26.0 - 34.0 (pg)    MCHC 33.5  30.0 - 36.0 (g/dL)    RDW 21.3  08.6 - 57.8 (%)    Platelets 340  150 - 400 (K/uL)   DIFFERENTIAL     Status: Abnormal   Collection Time   09/12/11  1:19 AM      Component Value Range Comment   Neutrophils Relative 75  43 - 77 (%)    Neutro Abs 8.8 (*) 1.7 - 7.7  (K/uL)    Lymphocytes Relative 12  12 - 46 (%)    Lymphs Abs 1.4  0.7 - 4.0 (K/uL)    Monocytes Relative 9  3 - 12 (%)    Monocytes Absolute 1.1 (*) 0.1 - 1.0 (K/uL)    Eosinophils Relative 4  0 - 5 (%)    Eosinophils Absolute 0.4  0.0 - 0.7 (K/uL)    Basophils Relative 1  0 - 1 (%)    Basophils Absolute 0.1  0.0 - 0.1 (K/uL)   BASIC METABOLIC PANEL     Status: Abnormal   Collection Time   09/12/11  1:19 AM  Component Value Range Comment   Sodium 135  135 - 145 (mEq/L)    Potassium 4.7  3.5 - 5.1 (mEq/L)    Chloride 100  96 - 112 (mEq/L)    CO2 24  19 - 32 (mEq/L)    Glucose, Bld 293 (*) 70 - 99 (mg/dL)    BUN 28 (*) 6 - 23 (mg/dL)    Creatinine, Ser 1.61 (*) 0.50 - 1.35 (mg/dL)    Calcium 9.2  8.4 - 10.5 (mg/dL)    GFR calc non Af Amer 32 (*) >90 (mL/min)    GFR calc Af Amer 37 (*) >90 (mL/min)   CARDIAC PANEL(CRET KIN+CKTOT+MB+TROPI)     Status: Normal   Collection Time   09/12/11  1:19 AM      Component Value Range Comment   Total CK 92  7 - 232 (U/L)    CK, MB 3.7  0.3 - 4.0 (ng/mL)    Troponin I <0.30  <0.30 (ng/mL)    Relative Index RELATIVE INDEX IS INVALID  0.0 - 2.5    GLUCOSE, CAPILLARY     Status: Abnormal   Collection Time   09/12/11  3:44 AM      Component Value Range Comment   Glucose-Capillary 385 (*) 70 - 99 (mg/dL)    Comment 1 Notify RN      Dg Chest 2 View  09/12/2011  *RADIOLOGY REPORT*  Clinical Data: Breath, cough.  CHEST - 2 VIEW  Comparison: 09/01/2011  Findings: Heart and mediastinal contours are within normal limits. No focal opacities or effusions.  No acute bony abnormality. Degenerative changes in the thoracic spine.  IMPRESSION: No active cardiopulmonary disease.  Original Report Authenticated By: Cyndie Chime, M.D.      Assessment Present on Admission:  .ASTHMA .DIABETES MELLITUS, TYPE II .BENIGN PROSTATIC HYPERTROPHY, HX OF .HEPATITIS, HX OF .HYPERTENSION .Dyspnea .Neurogenic bladder COPD exacerbation  PLAN: Will admit him for  nebulizer treatments and IV Zithromax. Despite him having diabetes, he'll benefit getting steroid. We'll give him Solu-Medrol. Will increase his insulin sliding scale adequately. For his neurogenic bladder he will continue to self catheterization as needed. He is stable, full code, and will be admitted to triad hospitalist service.  Other plans as per orders.    Tyronza Happe 09/12/2011, 5:59 AM

## 2011-09-13 DIAGNOSIS — J449 Chronic obstructive pulmonary disease, unspecified: Secondary | ICD-10-CM | POA: Diagnosis not present

## 2011-09-13 DIAGNOSIS — I10 Essential (primary) hypertension: Secondary | ICD-10-CM | POA: Diagnosis not present

## 2011-09-13 LAB — CBC
HCT: 37.7 % — ABNORMAL LOW (ref 39.0–52.0)
Hemoglobin: 12.8 g/dL — ABNORMAL LOW (ref 13.0–17.0)
MCHC: 34 g/dL (ref 30.0–36.0)
MCV: 89.1 fL (ref 78.0–100.0)
RDW: 14 % (ref 11.5–15.5)
WBC: 16.4 10*3/uL — ABNORMAL HIGH (ref 4.0–10.5)

## 2011-09-13 LAB — GLUCOSE, CAPILLARY
Glucose-Capillary: 138 mg/dL — ABNORMAL HIGH (ref 70–99)
Glucose-Capillary: 319 mg/dL — ABNORMAL HIGH (ref 70–99)

## 2011-09-13 LAB — BASIC METABOLIC PANEL
BUN: 39 mg/dL — ABNORMAL HIGH (ref 6–23)
CO2: 25 mEq/L (ref 19–32)
Chloride: 98 mEq/L (ref 96–112)
Creatinine, Ser: 1.75 mg/dL — ABNORMAL HIGH (ref 0.50–1.35)
Potassium: 5.1 mEq/L (ref 3.5–5.1)

## 2011-09-13 LAB — HEPATIC FUNCTION PANEL
AST: 19 U/L (ref 0–37)
Bilirubin, Direct: <0.1 mg/dL (ref 0.0–0.3)
Total Bilirubin: 0.2 mg/dL — ABNORMAL LOW (ref 0.3–1.2)

## 2011-09-13 MED ORDER — BISACODYL 5 MG PO TBEC
10.0000 mg | DELAYED_RELEASE_TABLET | Freq: Every day | ORAL | Status: DC | PRN
  Administered 2011-09-13 (×2): 10 mg via ORAL
  Filled 2011-09-13 (×2): qty 2

## 2011-09-13 MED ORDER — IRBESARTAN 150 MG PO TABS
150.0000 mg | ORAL_TABLET | Freq: Every day | ORAL | Status: DC
  Administered 2011-09-14: 150 mg via ORAL
  Filled 2011-09-13: qty 1

## 2011-09-13 MED ORDER — INSULIN GLARGINE 100 UNIT/ML ~~LOC~~ SOLN
30.0000 [IU] | Freq: Two times a day (BID) | SUBCUTANEOUS | Status: DC
  Administered 2011-09-13 – 2011-09-14 (×2): 30 [IU] via SUBCUTANEOUS

## 2011-09-13 NOTE — Progress Notes (Signed)
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  Consultants:  None  Procedures:  Chest x-ray two-view 2/23 = no active cardiopulmonary disease  Antibiotics:  Azithromycin 2/22>>>   Subjective  Feels well. Had a "rough night" last night short of breath. Still producing sputum. No current shortness of breath laying in bed. No chest pain no nausea no vomiting no blurred vision no double vision   Objective   Interim History: Chart reviewed nursing notes reviewed.  Objective: Filed Vitals:   09/13/11 0510 09/13/11 1037 09/13/11 1330 09/13/11 1333  BP: 149/85     Pulse: 83     Temp: 97.6 F (36.4 C)     TempSrc: Oral     Resp: 20     Height:      Weight: 64.638 kg (142 lb 8 oz)     SpO2: 98% 97% 94% 95%    Intake/Output Summary (Last 24 hours) at 09/13/11 1351 Last data filed at 09/13/11 0510  Gross per 24 hour  Intake   1340 ml  Output      0 ml  Net   1340 ml    Exam:  HEENT-no pallor no icterus, frail-looking male  CHEST-decreased air entry to lower bases of posterior lungs however no wheezes elicited no tactile vocal resonance or fremitus  CARDIAC-S1-S2 no murmur rub or gallop, no JVD no carotid bruit  ABDOMEN-soft nontender nondistended  NEURO- moves all 4 limbs equally. Strength grossly intact  Data Reviewed: Basic Metabolic Panel:  Lab 09/13/11 7829 09/12/11 0834 09/12/11 0119  NA 131* -- 135  K 5.1 -- 4.7  CL 98 -- 100  CO2 25 -- 24  GLUCOSE 320* 538* 293*  BUN 39* -- 28*  CREATININE 1.75* -- 1.86*  CALCIUM 9.6 -- 9.2  MG -- -- --  PHOS -- -- --   Liver Function Tests: No results found for this  basename: AST:5,ALT:5,ALKPHOS:5,BILITOT:5,PROT:5,ALBUMIN:5 in the last 168 hours No results found for this basename: LIPASE:5,AMYLASE:5 in the last 168 hours No results found for this basename: AMMONIA:5 in the last 168 hours CBC:  Lab 09/13/11 0351 09/12/11 0119  WBC 16.4* 11.8*  NEUTROABS -- 8.8*  HGB 12.8* 13.3  HCT 37.7* 39.7  MCV 89.1 91.1  PLT 381 340   Cardiac Enzymes:  Lab 09/12/11 0119  CKTOTAL 92  CKMB 3.7  CKMBINDEX --  TROPONINI <0.30   BNP: No components found with this basename: POCBNP:5 CBG:  Lab 09/12/11 2116 09/12/11 1654 09/12/11 1213 09/12/11 0737 09/12/11 0344  GLUCAP 367* 268* 379* 455* 385*    No results found for this or any previous visit (from the past 240 hour(s)).   Studies: Dg Chest 2 View  09/12/2011  *RADIOLOGY REPORT*  Clinical Data: Breath, cough.  CHEST - 2 VIEW  Comparison: 09/01/2011  Findings: Heart and mediastinal contours are within normal limits. No focal opacities or effusions.  No acute bony abnormality. Degenerative changes in the thoracic spine.  IMPRESSION: No active cardiopulmonary disease.  Original Report Authenticated By: Cyndie Chime, M.D.   Dg Chest 2 View  09/01/2011  *RADIOLOGY REPORT*  Clinical Data: Shortness of breath.  Coughing.  Ex-smoker . Asthma.  CHEST - 2 VIEW  Comparison: 03/22/2011.  Findings: Cardiac silhouette is  normal size and shape.  There is ectasia of thoracic aorta.  Mediastinal and hilar contours appear stable.  There is stable apical pleural and parenchymal pulmonary fibrotic change present.  There is slight elevation of left hilum consistent with contraction fibrosis.  Areas of emphysematous changes are seen.  No pulmonary edema, pneumonia, or pleural effusion is evident.  There is minimal central peribronchial thickening.  Syndesmophytes are seen in the spine.  IMPRESSION: Hyperinflation configuration consistent with COPD.  Fibrotic changes are detailed above.  No pulmonary edema, pneumonia, or pleural  effusion.  Minimal central peribronchial thickening.  This may be associated with bronchitis, asthma, and reactive airway disease.  Original Report Authenticated By: Crawford Givens, M.D.    Scheduled Meds:   . sodium chloride   Intravenous STAT  . albuterol  2.5 mg Nebulization Q6H  . antiseptic oral rinse  15 mL Mouth Rinse BID  . azithromycin  500 mg Oral Daily  . cholecalciferol  1,000 Units Oral Daily  . insulin aspart  0-20 Units Subcutaneous TID WC  . insulin aspart  0-5 Units Subcutaneous QHS  . insulin aspart  6 Units Subcutaneous TID WC  . insulin glargine  20 Units Subcutaneous BID  . irbesartan  300 mg Oral Daily  . predniSONE  60 mg Oral Q breakfast  . simvastatin  40 mg Oral Daily  . sodium chloride  3 mL Intravenous Q12H   Continuous Infusions:    Assessment/Plan: Patient Active Problem List   Diagnoses   .  COPD exacerbation-weanedV steroids to oral prednisone for 5days total.   Continue albuterol 2.5 mg nebs every 2 when necessary, add Atrovent 2 puffs every 6 when necessary. His primary care physician apparently came by this morning and has seen him and recommended to him to use a nebulizer. I have no problem with this. Patient walked 600 feet without oxygen and was at 95% on room air. Suspect he will not need further oxygen. Switched to oral azithromycin 500 by mouth to complete 5 d course   .  DIABETES MELLITUS, TYPE II-blood sugars 379-268. Continue resistant sliding scale insulin. Increase Lantus to 30 units.  Rica Koyanagi Zocor 40 mg daily   .  HYPERTENSION-continue Avapro 300--> 150 mg daily   Might need to add CCB   .  EOSINOPHILIC PNEUMONIA-history of this several years ago. In active problem   .  HYDRONEPHROSIS, BILATERAL-secondary to bladder outlet obstruction stable   .  HEPATITIS, HX OF C-stable at present time, as has not had LFTs in the recent past we'll add this to tomorrow morning's lab   .  BENIGN PROSTATIC HYPERTROPHY, HX OF-currently not  on medication for this. Outpatient reevaluation .  .   hyperkalemia-mild unclear etiology, could be secondary to Avapro. Patient states he sometimes takes potassium as a substitute for salt, but hasn't done so lately.     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 Would decrease his Avapro to 150 mg, rpt CMEt in am   .  Neurogenic bladder-reevaluate as needed    Code Status: Full code Disposition Plan: Likely discharge home one to 2 days. Might need  nebulizer.  [name], MD  [facility] Pager [phone] 09/13/2011, 1:51 PM    LOS: 1 day

## 2011-09-14 DIAGNOSIS — I10 Essential (primary) hypertension: Secondary | ICD-10-CM | POA: Diagnosis not present

## 2011-09-14 DIAGNOSIS — J449 Chronic obstructive pulmonary disease, unspecified: Secondary | ICD-10-CM | POA: Diagnosis not present

## 2011-09-14 LAB — GLUCOSE, CAPILLARY
Glucose-Capillary: 49 mg/dL — ABNORMAL LOW (ref 70–99)
Glucose-Capillary: 62 mg/dL — ABNORMAL LOW (ref 70–99)

## 2011-09-14 MED ORDER — ALBUTEROL SULFATE (5 MG/ML) 0.5% IN NEBU: 2.5000 mg | INHALATION_SOLUTION | RESPIRATORY_TRACT | Status: DC | PRN

## 2011-09-14 MED ORDER — PREDNISONE 20 MG PO TABS: 60.0000 mg | ORAL_TABLET | Freq: Every day | ORAL | Status: AC

## 2011-09-14 MED ORDER — IPRATROPIUM BROMIDE HFA 17 MCG/ACT IN AERS: 2.0000 | INHALATION_SPRAY | Freq: Four times a day (QID) | RESPIRATORY_TRACT | Status: DC | PRN

## 2011-09-14 MED ORDER — NEBULIZER COMPRESSOR KIT: 1.0000 | PACK | Freq: Four times a day (QID) | Status: DC | PRN

## 2011-09-14 MED ORDER — INSULIN GLARGINE 100 UNIT/ML ~~LOC~~ SOLN: 25.0000 [IU] | Freq: Two times a day (BID) | SUBCUTANEOUS | Status: DC

## 2011-09-14 MED ORDER — BISACODYL 5 MG PO TBEC: 10.0000 mg | DELAYED_RELEASE_TABLET | Freq: Every day | ORAL | Status: AC | PRN

## 2011-09-14 MED ORDER — INSULIN GLARGINE 100 UNIT/ML ~~LOC~~ SOLN: 20.0000 [IU] | Freq: Two times a day (BID) | SUBCUTANEOUS | Status: DC

## 2011-09-14 MED ORDER — AZITHROMYCIN 500 MG PO TABS: 500.0000 mg | ORAL_TABLET | Freq: Every day | ORAL | Status: AC

## 2011-09-14 NOTE — Discharge Summary (Addendum)
[facility] Hospital Discharge Summary  Date of Admission: 09/12/2011 12:02 AM Admitter: @ADMITPROV @   Date of Discharge2/25/2013 Attending Physician: [name], MD  To do on followup -Repeat be met, patient had his Benicar changed in hospital to another medication which may of caused slight hyperkalemia. Patient also takes potassium salts and should be cautioned about this. -Repeat chest x-ray as you see fit -Elevated blood sugars in hospital. Increase his Lantus. 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 Initial x-ray showed emphysema but no definitive infiltrate and patient received 3 nebulizer treatments and oral prednisone and was admitted for further evaluation as well as management.  Past medical history:  COPD, diabetes type 2, hypertension, hyperlipidemia, bilateral hydronephrosis, history of hepatitis C, benign prostatic hypertrophy and neurogenic bladder.   Consultants:  None Procedures:  Chest x-ray two-view 2/23 = no active cardiopulmonary disease Antibiotics:  Azithromycin 2/22>>> 2/27 Hospital Course by problem list:   LOS: 2 days   Assessment/Plan:  Patient Active Problem List   Diagnoses   .  COPD exacerbation-weaned IV steroids which were started 2/23 to oral prednisone for 5 days total.  Continue albuterol 2.5 mg nebs every 2 when necessary, add Atrovent 2 puffs every 6 when necessary-he does seem to become more short of breath at night and required nebulizations as such a nebulizer was prescribed for him to obtain. We have also continued his regular inhalers for milder symptoms Patient walked 600 feet without oxygen and was at 95% on room air-and he will not need further oxygen.    Switched to oral azithromycin 500 by mouth to complete 5 d course.  Marland Kitchen  DIABETES MELLITUS, TYPE II-blood sugars 379-268. Continue resistant sliding scale insulin. His Lantus was initially increased to 30 units, but then he started having slightly lower blood sugars in the 70 range the morning of discharge. He dropped as low as 39, and he was kept on his regular lantus 20 units bid  .  DYSLIPIDEMIA-continue Zocor 40 mg daily   .  HYPERTENSIO CCB. He was discontinued off of his usual Benicar and changed to Avapro 300--> 150 mg daily. Decrease was made because of some hyperkalemia with potassium 5.1  His blood pressure trends were slightly high in hospital and we might need to add calcium channel blocker   .  EOSINOPHILIC PNEUMONIA-history of this several years ago. In active problem   .  HYDRONEPHROSIS, BILATERAL-secondary to bladder outlet obstruction stable   .  HEPATITIS, HX OF C-stable at present time, LFTs done in the hospital did not indicate any gross elevation and his abdomen was only slightly depressed at 3.3   .  BENIGN PROSTATIC HYPERTROPHY, HX OF-currently not on medication for this. Outpatient reevaluation .   .  hyperkalemia-mild unclear etiology, could be secondary to Avapro. Patient states he sometimes takes potassium as a substitute for salt, but hasn't done so lately. 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 Would decrease his Avapro to 150 mg, rpt CMEt in am   .  Neurogenic bladder-reevaluate as needed      Procedures Performed and pertinent labs: Dg Chest 2 View  09/12/2011  *RADIOLOGY REPORT*  Clinical Data: Breath, cough.  CHEST - 2 VIEW  Comparison: 09/01/2011  Findings: Heart and mediastinal contours are within normal limits. No focal opacities or effusions.  No acute bony abnormality. Degenerative changes in the thoracic spine.  IMPRESSION: No active cardiopulmonary disease.   Original Report Authenticated By: Cyndie Chime, M.D.   Dg Chest 2 View  09/01/2011  *RADIOLOGY REPORT*  Clinical Data: Shortness of breath.  Coughing.  Ex-smoker . Asthma.  CHEST - 2 VIEW  Comparison: 03/22/2011.  Findings: Cardiac silhouette is normal size and shape.  There is ectasia of thoracic aorta.  Mediastinal and hilar contours appear stable.  There is stable apical pleural and parenchymal pulmonary fibrotic change present.  There is slight elevation of left hilum consistent with contraction fibrosis.  Areas of emphysematous changes are seen.  No pulmonary edema, pneumonia, or pleural effusion is evident.  There is minimal central peribronchial thickening.  Syndesmophytes are seen in the spine.  IMPRESSION: Hyperinflation configuration consistent with COPD.  Fibrotic changes are detailed above.  No pulmonary edema, pneumonia, or pleural effusion.  Minimal central peribronchial thickening.  This may be associated with bronchitis, asthma, and reactive airway disease.  Original Report Authenticated By: Crawford Givens, M.D.    Discharge Vitals & PE:  BP 141/77  Pulse 94  Temp(Src) 97.8 F (36.6 C) (Oral)  Resp 18  Ht 5\' 10"  (1.778 m)  Wt 64.139 kg (141 lb 6.4 oz)  BMI 20.29 kg/m2  SpO2 93% Doing fair this morning. Did not sleep well last night. No more shortness of breath. Some mild productive sputum.  Alert oriented Chest clinically clear no added sound, no tactile vocal resonance or fremitus Abdomen soft nontender nondistended Neurologically intact S1-S2 no murmur rub or gallop No JVD no bruit  Discharge Labs:  Results for orders placed during the hospital encounter of 09/12/11 (from the past 24 hour(s))  GLUCOSE, CAPILLARY     Status: Abnormal   Collection Time   09/13/11 12:02 PM      Component Value Range   Glucose-Capillary 138 (*) 70 - 99 (mg/dL)   Comment 1 Documented in Chart     Comment 2 Notify RN    GLUCOSE, CAPILLARY     Status: Abnormal   Collection Time   09/13/11   4:53 PM      Component Value Range   Glucose-Capillary 186 (*) 70 - 99 (mg/dL)   Comment 1 Documented in Chart     Comment 2 Notify RN    GLUCOSE, CAPILLARY     Status: Abnormal   Collection Time   09/13/11  9:13 PM      Component Value Range   Glucose-Capillary 319 (*) 70 - 99 (mg/dL)   Comment 1 Notify RN    GLUCOSE, CAPILLARY     Status: Normal   Collection Time   09/14/11  7:12 AM      Component Value Range   Glucose-Capillary 74  70 - 99 (mg/dL)   Comment 1 Notify RN      Disposition and follow-up:   Mr.Benjamin Riddle was discharged from in good condition.    Follow-up Appointments:  Follow-up Information    Follow up with PERINI,MARK A, MD .          Discharge Medications: Medication List  As of 09/14/2011 10:31 AM   TAKE these medications         ADVAIR DISKUS 100-50 MCG/DOSE Aepb   Generic drug: Fluticasone-Salmeterol   Inhale 1 puff into the lungs 2 (two) times daily.  albuterol (5 MG/ML) 0.5% nebulizer solution   Commonly known as: PROVENTIL   Take 0.5 mLs (2.5 mg total) by nebulization every 4 (four) hours as needed for wheezing.      albuterol 90 MCG/ACT inhaler   Commonly known as: PROVENTIL,VENTOLIN   Inhale 2 puffs into the lungs every 4 (four) hours as needed.      azithromycin 500 MG tablet   Commonly known as: ZITHROMAX   Take 1 tablet (500 mg total) by mouth daily.      bisacodyl 5 MG EC tablet   Commonly known as: DULCOLAX   Take 2 tablets (10 mg total) by mouth daily as needed.      insulin glargine 100 UNIT/ML injection   Commonly known as: LANTUS   Inject 25 Units into the skin 2 (two) times daily.      insulin lispro 100 UNIT/ML injection   Commonly known as: HUMALOG   Inject 4-10 Units into the skin 4 (four) times daily - after meals and at bedtime.      ipratropium 17 MCG/ACT inhaler   Commonly known as: ATROVENT HFA   Inhale 2 puffs into the lungs every 6 (six) hours as needed (sob).      olmesartan 40 MG tablet   Commonly  known as: BENICAR   Take 40 mg by mouth daily. Per PCP      predniSONE 20 MG tablet   Commonly known as: DELTASONE   Take 3 tablets (60 mg total) by mouth daily with breakfast.      simvastatin 40 MG tablet   Commonly known as: ZOCOR   Take 40 mg by mouth daily.      Vitamin D 1000 UNITS capsule   Take 1,000 Units by mouth daily.           Medications Discontinued During This Encounter  Medication Reason  . diphenhydrAMINE (BENADRYL) 25 MG tablet Discontinued by provider  . albuterol (PROVENTIL) (5 MG/ML) 0.5% nebulizer solution 5 mg   . azithromycin (ZITHROMAX) 500 mg in dextrose 5 % 250 mL IVPB   . methylPREDNISolone sodium succinate (SOLU-MEDROL) 125 MG injection 60 mg   . insulin glargine (LANTUS) injection 20 Units   . irbesartan (AVAPRO) tablet 300 mg   . insulin glargine (LANTUS) 100 UNIT/ML injection     Signed: Airyana Sprunger,JAI 09/14/2011, 10:31 AM

## 2011-09-14 NOTE — Progress Notes (Signed)
CRITICAL VALUE ALERT  Critical value received39  Date of notification:  09/14/2011 now  Critical value read back:yes  Nurse who received alert:  Timesha Cervantez, Illene Silver   MD notified (1st page):  Dr Mahala Menghini  Time of first page: 1130  MD notified (2nd page):  Time of second page:  Responding MD:  Dr Mahala Menghini   Time MD responded: 1130

## 2011-09-14 NOTE — Progress Notes (Signed)
Pt selected [facility] for DME. Referral given to in house rep at [facility], Home Neb will be delivered to the pt's home, pt is aware. mp

## 2011-09-14 NOTE — Progress Notes (Signed)
Inpatient Diabetes Program Recommendations  AACE/ADA: New Consensus Statement on Inpatient Glycemic Control (2009)  Target Ranges:  Prepandial:   less than 140 mg/dL      Peak postprandial:   less than 180 mg/dL (1-2 hours)      Critically ill patients:  140 - 180 mg/dL   Reason for Visit: Hyperglycemia / Hypoglycemia  Results for Benjamin Riddle, Benjamin Riddle (MRN [id number]) as of 09/14/2011 15:43  Ref. Range 09/13/2011 16:53 09/13/2011 21:13 09/14/2011 07:12 09/14/2011 11:26 09/14/2011 11:47 09/14/2011 12:17 09/14/2011 13:09 09/14/2011 14:10  Glucose-Capillary Latest Range: 70-99 mg/dL 409 (H) 811 (H) 74 39 (LL) 41 (LL) 49 (L) 62 (L) 124 (H)      Inpatient Diabetes Program Recommendations Insulin - Basal: Lantus 10 units bid Correction (SSI): Decrease Novolog correction to sensitive tidwc Insulin - Meal Coverage: Novolog 3 units tidwc Diet: Add CHO-mod med to heart healthy diet  Note: Will follow.

## 2011-09-14 NOTE — Progress Notes (Signed)
Patient blood sugar at 1130 was 39, OJ and crackers given with peanut butter. Blood sugar at 1147 still 41. More oj and snacks given, wife also gave patient sugar pills from home.Patient asymptomatic at this time.At 1309 BS still 62 patient had eaten all his lunch.Patient and wife had refused D50. Milk with sugar given plus more crackers and peanut butter. Repeat blood sugar 125.Dr Mahala Menghini gave okay for patient to go home when sugar goes up over 100.Patient and wife wants to go home and claims they are knowledgeable in taking care of his BS at home, discharge instructions given.

## 2011-09-15 DIAGNOSIS — E109 Type 1 diabetes mellitus without complications: Secondary | ICD-10-CM | POA: Diagnosis not present

## 2011-09-15 DIAGNOSIS — I10 Essential (primary) hypertension: Secondary | ICD-10-CM | POA: Diagnosis not present

## 2011-09-15 DIAGNOSIS — J449 Chronic obstructive pulmonary disease, unspecified: Secondary | ICD-10-CM | POA: Diagnosis not present

## 2011-09-24 DIAGNOSIS — I10 Essential (primary) hypertension: Secondary | ICD-10-CM | POA: Diagnosis not present

## 2011-09-24 DIAGNOSIS — E109 Type 1 diabetes mellitus without complications: Secondary | ICD-10-CM | POA: Diagnosis not present

## 2011-09-24 DIAGNOSIS — J449 Chronic obstructive pulmonary disease, unspecified: Secondary | ICD-10-CM | POA: Diagnosis not present

## 2011-09-25 ENCOUNTER — Other Ambulatory Visit (HOSPITAL_COMMUNITY): Payer: Self-pay | Admitting: Radiology

## 2011-09-25 ENCOUNTER — Other Ambulatory Visit (HOSPITAL_COMMUNITY): Payer: Self-pay | Admitting: Internal Medicine

## 2011-09-25 DIAGNOSIS — J449 Chronic obstructive pulmonary disease, unspecified: Secondary | ICD-10-CM

## 2011-09-30 ENCOUNTER — Encounter (HOSPITAL_COMMUNITY): Payer: Medicare Other

## 2011-10-06 DIAGNOSIS — E785 Hyperlipidemia, unspecified: Secondary | ICD-10-CM | POA: Diagnosis not present

## 2011-10-06 DIAGNOSIS — E1065 Type 1 diabetes mellitus with hyperglycemia: Secondary | ICD-10-CM | POA: Diagnosis not present

## 2011-10-07 DIAGNOSIS — E785 Hyperlipidemia, unspecified: Secondary | ICD-10-CM | POA: Diagnosis not present

## 2011-10-07 DIAGNOSIS — E1065 Type 1 diabetes mellitus with hyperglycemia: Secondary | ICD-10-CM | POA: Diagnosis not present

## 2011-10-07 DIAGNOSIS — I129 Hypertensive chronic kidney disease with stage 1 through stage 4 chronic kidney disease, or unspecified chronic kidney disease: Secondary | ICD-10-CM | POA: Diagnosis not present

## 2011-10-07 DIAGNOSIS — R5382 Chronic fatigue, unspecified: Secondary | ICD-10-CM | POA: Diagnosis not present

## 2011-10-09 ENCOUNTER — Ambulatory Visit (HOSPITAL_COMMUNITY)
Admission: RE | Admit: 2011-10-09 | Discharge: 2011-10-09 | Disposition: A | Discharge status: Home / Self Care | Payer: Medicare Other | Source: Ambulatory Visit | Attending: Internal Medicine | Admitting: Internal Medicine

## 2011-10-09 DIAGNOSIS — J449 Chronic obstructive pulmonary disease, unspecified: Secondary | ICD-10-CM | POA: Insufficient documentation

## 2011-10-09 DIAGNOSIS — J4489 Other specified chronic obstructive pulmonary disease: Secondary | ICD-10-CM | POA: Insufficient documentation

## 2011-10-09 MED ORDER — ALBUTEROL SULFATE (5 MG/ML) 0.5% IN NEBU
2.5000 mg | INHALATION_SOLUTION | Freq: Once | RESPIRATORY_TRACT | Status: AC
  Administered 2011-10-09: 2.5 mg via RESPIRATORY_TRACT

## 2011-10-12 ENCOUNTER — Encounter: Payer: Self-pay | Admitting: Cardiology

## 2011-10-14 DIAGNOSIS — J449 Chronic obstructive pulmonary disease, unspecified: Secondary | ICD-10-CM | POA: Diagnosis not present

## 2011-10-14 DIAGNOSIS — I10 Essential (primary) hypertension: Secondary | ICD-10-CM | POA: Diagnosis not present

## 2011-10-14 DIAGNOSIS — E109 Type 1 diabetes mellitus without complications: Secondary | ICD-10-CM | POA: Diagnosis not present

## 2011-10-22 DIAGNOSIS — R7301 Impaired fasting glucose: Secondary | ICD-10-CM | POA: Diagnosis not present

## 2011-10-22 DIAGNOSIS — R5383 Other fatigue: Secondary | ICD-10-CM | POA: Diagnosis not present

## 2012-01-07 DIAGNOSIS — N1330 Unspecified hydronephrosis: Secondary | ICD-10-CM | POA: Diagnosis not present

## 2012-01-07 DIAGNOSIS — N319 Neuromuscular dysfunction of bladder, unspecified: Secondary | ICD-10-CM | POA: Diagnosis not present

## 2012-01-07 DIAGNOSIS — N3020 Other chronic cystitis without hematuria: Secondary | ICD-10-CM | POA: Diagnosis not present

## 2012-01-12 DIAGNOSIS — E1065 Type 1 diabetes mellitus with hyperglycemia: Secondary | ICD-10-CM | POA: Diagnosis not present

## 2012-01-12 DIAGNOSIS — E785 Hyperlipidemia, unspecified: Secondary | ICD-10-CM | POA: Diagnosis not present

## 2012-01-13 DIAGNOSIS — E1029 Type 1 diabetes mellitus with other diabetic kidney complication: Secondary | ICD-10-CM | POA: Diagnosis not present

## 2012-01-13 DIAGNOSIS — I10 Essential (primary) hypertension: Secondary | ICD-10-CM | POA: Diagnosis not present

## 2012-01-13 DIAGNOSIS — E785 Hyperlipidemia, unspecified: Secondary | ICD-10-CM | POA: Diagnosis not present

## 2012-01-13 DIAGNOSIS — R5382 Chronic fatigue, unspecified: Secondary | ICD-10-CM | POA: Diagnosis not present

## 2012-03-08 DIAGNOSIS — R82998 Other abnormal findings in urine: Secondary | ICD-10-CM | POA: Diagnosis not present

## 2012-03-08 DIAGNOSIS — Z125 Encounter for screening for malignant neoplasm of prostate: Secondary | ICD-10-CM | POA: Diagnosis not present

## 2012-03-08 DIAGNOSIS — E109 Type 1 diabetes mellitus without complications: Secondary | ICD-10-CM | POA: Diagnosis not present

## 2012-03-08 DIAGNOSIS — E785 Hyperlipidemia, unspecified: Secondary | ICD-10-CM | POA: Diagnosis not present

## 2012-03-08 DIAGNOSIS — M810 Age-related osteoporosis without current pathological fracture: Secondary | ICD-10-CM | POA: Diagnosis not present

## 2012-03-08 DIAGNOSIS — I10 Essential (primary) hypertension: Secondary | ICD-10-CM | POA: Diagnosis not present

## 2012-03-15 DIAGNOSIS — Z1212 Encounter for screening for malignant neoplasm of rectum: Secondary | ICD-10-CM | POA: Diagnosis not present

## 2012-03-18 DIAGNOSIS — E109 Type 1 diabetes mellitus without complications: Secondary | ICD-10-CM | POA: Diagnosis not present

## 2012-03-18 DIAGNOSIS — F329 Major depressive disorder, single episode, unspecified: Secondary | ICD-10-CM | POA: Diagnosis not present

## 2012-03-18 DIAGNOSIS — R5381 Other malaise: Secondary | ICD-10-CM | POA: Diagnosis not present

## 2012-03-18 DIAGNOSIS — J449 Chronic obstructive pulmonary disease, unspecified: Secondary | ICD-10-CM | POA: Diagnosis not present

## 2012-03-18 DIAGNOSIS — R0602 Shortness of breath: Secondary | ICD-10-CM | POA: Diagnosis not present

## 2012-03-18 DIAGNOSIS — E291 Testicular hypofunction: Secondary | ICD-10-CM | POA: Diagnosis not present

## 2012-03-18 DIAGNOSIS — Z0000 Encounter for general adult medical examination without abnormal findings: Secondary | ICD-10-CM | POA: Diagnosis not present

## 2012-03-24 DIAGNOSIS — R0602 Shortness of breath: Secondary | ICD-10-CM | POA: Diagnosis not present

## 2012-03-24 DIAGNOSIS — J45909 Unspecified asthma, uncomplicated: Secondary | ICD-10-CM | POA: Diagnosis not present

## 2012-04-06 DIAGNOSIS — E291 Testicular hypofunction: Secondary | ICD-10-CM | POA: Diagnosis not present

## 2012-04-19 DIAGNOSIS — E1029 Type 1 diabetes mellitus with other diabetic kidney complication: Secondary | ICD-10-CM | POA: Diagnosis not present

## 2012-04-19 DIAGNOSIS — E785 Hyperlipidemia, unspecified: Secondary | ICD-10-CM | POA: Diagnosis not present

## 2012-04-19 DIAGNOSIS — E1065 Type 1 diabetes mellitus with hyperglycemia: Secondary | ICD-10-CM | POA: Diagnosis not present

## 2012-04-20 DIAGNOSIS — E1029 Type 1 diabetes mellitus with other diabetic kidney complication: Secondary | ICD-10-CM | POA: Diagnosis not present

## 2012-04-20 DIAGNOSIS — I10 Essential (primary) hypertension: Secondary | ICD-10-CM | POA: Diagnosis not present

## 2012-04-20 DIAGNOSIS — E785 Hyperlipidemia, unspecified: Secondary | ICD-10-CM | POA: Diagnosis not present

## 2012-04-20 DIAGNOSIS — R5382 Chronic fatigue, unspecified: Secondary | ICD-10-CM | POA: Diagnosis not present

## 2012-04-20 DIAGNOSIS — E1065 Type 1 diabetes mellitus with hyperglycemia: Secondary | ICD-10-CM | POA: Diagnosis not present

## 2012-04-27 DIAGNOSIS — E291 Testicular hypofunction: Secondary | ICD-10-CM | POA: Diagnosis not present

## 2012-05-02 DIAGNOSIS — Z23 Encounter for immunization: Secondary | ICD-10-CM | POA: Diagnosis not present

## 2012-05-09 DIAGNOSIS — H2510 Age-related nuclear cataract, unspecified eye: Secondary | ICD-10-CM | POA: Diagnosis not present

## 2012-05-09 DIAGNOSIS — E119 Type 2 diabetes mellitus without complications: Secondary | ICD-10-CM | POA: Diagnosis not present

## 2012-05-09 DIAGNOSIS — H26019 Infantile and juvenile cortical, lamellar, or zonular cataract, unspecified eye: Secondary | ICD-10-CM | POA: Diagnosis not present

## 2012-05-18 DIAGNOSIS — E291 Testicular hypofunction: Secondary | ICD-10-CM | POA: Diagnosis not present

## 2012-06-07 DIAGNOSIS — E291 Testicular hypofunction: Secondary | ICD-10-CM | POA: Diagnosis not present

## 2012-06-21 DIAGNOSIS — J449 Chronic obstructive pulmonary disease, unspecified: Secondary | ICD-10-CM | POA: Diagnosis not present

## 2012-06-21 DIAGNOSIS — I10 Essential (primary) hypertension: Secondary | ICD-10-CM | POA: Diagnosis not present

## 2012-06-21 DIAGNOSIS — E291 Testicular hypofunction: Secondary | ICD-10-CM | POA: Diagnosis not present

## 2012-06-21 DIAGNOSIS — N319 Neuromuscular dysfunction of bladder, unspecified: Secondary | ICD-10-CM | POA: Diagnosis not present

## 2012-06-21 DIAGNOSIS — Z79899 Other long term (current) drug therapy: Secondary | ICD-10-CM | POA: Diagnosis not present

## 2012-06-21 DIAGNOSIS — E109 Type 1 diabetes mellitus without complications: Secondary | ICD-10-CM | POA: Diagnosis not present

## 2012-06-21 DIAGNOSIS — M810 Age-related osteoporosis without current pathological fracture: Secondary | ICD-10-CM | POA: Diagnosis not present

## 2012-07-08 DIAGNOSIS — N3020 Other chronic cystitis without hematuria: Secondary | ICD-10-CM | POA: Diagnosis not present

## 2012-07-12 DIAGNOSIS — E109 Type 1 diabetes mellitus without complications: Secondary | ICD-10-CM | POA: Diagnosis not present

## 2012-07-12 DIAGNOSIS — I10 Essential (primary) hypertension: Secondary | ICD-10-CM | POA: Diagnosis not present

## 2012-07-26 DIAGNOSIS — E291 Testicular hypofunction: Secondary | ICD-10-CM | POA: Diagnosis not present

## 2012-08-02 DIAGNOSIS — E785 Hyperlipidemia, unspecified: Secondary | ICD-10-CM | POA: Diagnosis not present

## 2012-08-02 DIAGNOSIS — E1029 Type 1 diabetes mellitus with other diabetic kidney complication: Secondary | ICD-10-CM | POA: Diagnosis not present

## 2012-08-03 DIAGNOSIS — R5382 Chronic fatigue, unspecified: Secondary | ICD-10-CM | POA: Diagnosis not present

## 2012-08-03 DIAGNOSIS — E1065 Type 1 diabetes mellitus with hyperglycemia: Secondary | ICD-10-CM | POA: Diagnosis not present

## 2012-08-03 DIAGNOSIS — E1029 Type 1 diabetes mellitus with other diabetic kidney complication: Secondary | ICD-10-CM | POA: Diagnosis not present

## 2012-08-03 DIAGNOSIS — E785 Hyperlipidemia, unspecified: Secondary | ICD-10-CM | POA: Diagnosis not present

## 2012-08-03 DIAGNOSIS — I10 Essential (primary) hypertension: Secondary | ICD-10-CM | POA: Diagnosis not present

## 2012-08-23 DIAGNOSIS — E291 Testicular hypofunction: Secondary | ICD-10-CM | POA: Diagnosis not present

## 2012-09-13 DIAGNOSIS — E291 Testicular hypofunction: Secondary | ICD-10-CM | POA: Diagnosis not present

## 2012-10-05 DIAGNOSIS — E291 Testicular hypofunction: Secondary | ICD-10-CM | POA: Diagnosis not present

## 2012-10-26 DIAGNOSIS — J449 Chronic obstructive pulmonary disease, unspecified: Secondary | ICD-10-CM | POA: Diagnosis not present

## 2012-10-26 DIAGNOSIS — E291 Testicular hypofunction: Secondary | ICD-10-CM | POA: Diagnosis not present

## 2012-10-26 DIAGNOSIS — I10 Essential (primary) hypertension: Secondary | ICD-10-CM | POA: Diagnosis not present

## 2012-10-26 DIAGNOSIS — E109 Type 1 diabetes mellitus without complications: Secondary | ICD-10-CM | POA: Diagnosis not present

## 2012-10-26 DIAGNOSIS — Z23 Encounter for immunization: Secondary | ICD-10-CM | POA: Diagnosis not present

## 2012-11-08 DIAGNOSIS — E1029 Type 1 diabetes mellitus with other diabetic kidney complication: Secondary | ICD-10-CM | POA: Diagnosis not present

## 2012-11-08 DIAGNOSIS — E1065 Type 1 diabetes mellitus with hyperglycemia: Secondary | ICD-10-CM | POA: Diagnosis not present

## 2012-11-08 DIAGNOSIS — E785 Hyperlipidemia, unspecified: Secondary | ICD-10-CM | POA: Diagnosis not present

## 2012-11-09 DIAGNOSIS — E1029 Type 1 diabetes mellitus with other diabetic kidney complication: Secondary | ICD-10-CM | POA: Diagnosis not present

## 2012-11-09 DIAGNOSIS — I10 Essential (primary) hypertension: Secondary | ICD-10-CM | POA: Diagnosis not present

## 2012-11-09 DIAGNOSIS — E785 Hyperlipidemia, unspecified: Secondary | ICD-10-CM | POA: Diagnosis not present

## 2012-11-09 DIAGNOSIS — R5382 Chronic fatigue, unspecified: Secondary | ICD-10-CM | POA: Diagnosis not present

## 2012-11-16 DIAGNOSIS — E291 Testicular hypofunction: Secondary | ICD-10-CM | POA: Diagnosis not present

## 2012-12-21 DIAGNOSIS — E291 Testicular hypofunction: Secondary | ICD-10-CM | POA: Diagnosis not present

## 2013-01-18 DIAGNOSIS — Z79899 Other long term (current) drug therapy: Secondary | ICD-10-CM | POA: Diagnosis not present

## 2013-01-18 DIAGNOSIS — E291 Testicular hypofunction: Secondary | ICD-10-CM | POA: Diagnosis not present

## 2013-02-07 DIAGNOSIS — R5381 Other malaise: Secondary | ICD-10-CM | POA: Diagnosis not present

## 2013-02-07 DIAGNOSIS — R269 Unspecified abnormalities of gait and mobility: Secondary | ICD-10-CM | POA: Diagnosis not present

## 2013-02-07 DIAGNOSIS — R5383 Other fatigue: Secondary | ICD-10-CM | POA: Diagnosis not present

## 2013-02-07 DIAGNOSIS — I10 Essential (primary) hypertension: Secondary | ICD-10-CM | POA: Diagnosis not present

## 2013-02-07 DIAGNOSIS — IMO0002 Reserved for concepts with insufficient information to code with codable children: Secondary | ICD-10-CM | POA: Diagnosis not present

## 2013-02-07 DIAGNOSIS — E291 Testicular hypofunction: Secondary | ICD-10-CM | POA: Diagnosis not present

## 2013-02-07 DIAGNOSIS — E109 Type 1 diabetes mellitus without complications: Secondary | ICD-10-CM | POA: Diagnosis not present

## 2013-02-07 DIAGNOSIS — K648 Other hemorrhoids: Secondary | ICD-10-CM | POA: Diagnosis not present

## 2013-02-16 DIAGNOSIS — R269 Unspecified abnormalities of gait and mobility: Secondary | ICD-10-CM | POA: Diagnosis not present

## 2013-02-21 DIAGNOSIS — R269 Unspecified abnormalities of gait and mobility: Secondary | ICD-10-CM | POA: Diagnosis not present

## 2013-02-24 DIAGNOSIS — R269 Unspecified abnormalities of gait and mobility: Secondary | ICD-10-CM | POA: Diagnosis not present

## 2013-03-06 DIAGNOSIS — E109 Type 1 diabetes mellitus without complications: Secondary | ICD-10-CM | POA: Diagnosis not present

## 2013-03-06 DIAGNOSIS — E291 Testicular hypofunction: Secondary | ICD-10-CM | POA: Diagnosis not present

## 2013-03-06 DIAGNOSIS — IMO0002 Reserved for concepts with insufficient information to code with codable children: Secondary | ICD-10-CM | POA: Diagnosis not present

## 2013-03-06 DIAGNOSIS — K648 Other hemorrhoids: Secondary | ICD-10-CM | POA: Diagnosis not present

## 2013-03-06 DIAGNOSIS — I10 Essential (primary) hypertension: Secondary | ICD-10-CM | POA: Diagnosis not present

## 2013-03-06 DIAGNOSIS — R5381 Other malaise: Secondary | ICD-10-CM | POA: Diagnosis not present

## 2013-03-06 DIAGNOSIS — R269 Unspecified abnormalities of gait and mobility: Secondary | ICD-10-CM | POA: Diagnosis not present

## 2013-03-14 DIAGNOSIS — E291 Testicular hypofunction: Secondary | ICD-10-CM | POA: Diagnosis not present

## 2013-03-14 DIAGNOSIS — Z23 Encounter for immunization: Secondary | ICD-10-CM | POA: Diagnosis not present

## 2013-03-15 DIAGNOSIS — K625 Hemorrhage of anus and rectum: Secondary | ICD-10-CM | POA: Diagnosis not present

## 2013-03-15 DIAGNOSIS — K648 Other hemorrhoids: Secondary | ICD-10-CM | POA: Diagnosis not present

## 2013-03-19 DIAGNOSIS — E161 Other hypoglycemia: Secondary | ICD-10-CM | POA: Diagnosis not present

## 2013-03-19 DIAGNOSIS — R7301 Impaired fasting glucose: Secondary | ICD-10-CM | POA: Diagnosis not present

## 2013-03-29 DIAGNOSIS — K648 Other hemorrhoids: Secondary | ICD-10-CM | POA: Diagnosis not present

## 2013-04-04 DIAGNOSIS — E1029 Type 1 diabetes mellitus with other diabetic kidney complication: Secondary | ICD-10-CM | POA: Diagnosis not present

## 2013-04-04 DIAGNOSIS — E785 Hyperlipidemia, unspecified: Secondary | ICD-10-CM | POA: Diagnosis not present

## 2013-04-05 DIAGNOSIS — R945 Abnormal results of liver function studies: Secondary | ICD-10-CM | POA: Diagnosis not present

## 2013-04-05 DIAGNOSIS — E291 Testicular hypofunction: Secondary | ICD-10-CM | POA: Diagnosis not present

## 2013-04-12 DIAGNOSIS — K648 Other hemorrhoids: Secondary | ICD-10-CM | POA: Diagnosis not present

## 2013-04-14 DIAGNOSIS — E1029 Type 1 diabetes mellitus with other diabetic kidney complication: Secondary | ICD-10-CM | POA: Diagnosis not present

## 2013-04-14 DIAGNOSIS — R5381 Other malaise: Secondary | ICD-10-CM | POA: Diagnosis not present

## 2013-04-14 DIAGNOSIS — E785 Hyperlipidemia, unspecified: Secondary | ICD-10-CM | POA: Diagnosis not present

## 2013-04-14 DIAGNOSIS — I10 Essential (primary) hypertension: Secondary | ICD-10-CM | POA: Diagnosis not present

## 2013-04-21 DIAGNOSIS — E109 Type 1 diabetes mellitus without complications: Secondary | ICD-10-CM | POA: Diagnosis not present

## 2013-04-21 DIAGNOSIS — M810 Age-related osteoporosis without current pathological fracture: Secondary | ICD-10-CM | POA: Diagnosis not present

## 2013-04-21 DIAGNOSIS — E785 Hyperlipidemia, unspecified: Secondary | ICD-10-CM | POA: Diagnosis not present

## 2013-04-21 DIAGNOSIS — Z125 Encounter for screening for malignant neoplasm of prostate: Secondary | ICD-10-CM | POA: Diagnosis not present

## 2013-05-24 DIAGNOSIS — J449 Chronic obstructive pulmonary disease, unspecified: Secondary | ICD-10-CM | POA: Diagnosis not present

## 2013-05-24 DIAGNOSIS — Z1331 Encounter for screening for depression: Secondary | ICD-10-CM | POA: Diagnosis not present

## 2013-05-24 DIAGNOSIS — K648 Other hemorrhoids: Secondary | ICD-10-CM | POA: Diagnosis not present

## 2013-05-24 DIAGNOSIS — R945 Abnormal results of liver function studies: Secondary | ICD-10-CM | POA: Diagnosis not present

## 2013-05-24 DIAGNOSIS — N319 Neuromuscular dysfunction of bladder, unspecified: Secondary | ICD-10-CM | POA: Diagnosis not present

## 2013-05-24 DIAGNOSIS — Z0000 Encounter for general adult medical examination without abnormal findings: Secondary | ICD-10-CM | POA: Diagnosis not present

## 2013-05-24 DIAGNOSIS — R011 Cardiac murmur, unspecified: Secondary | ICD-10-CM | POA: Diagnosis not present

## 2013-05-24 DIAGNOSIS — R269 Unspecified abnormalities of gait and mobility: Secondary | ICD-10-CM | POA: Diagnosis not present

## 2013-05-24 DIAGNOSIS — R0602 Shortness of breath: Secondary | ICD-10-CM | POA: Diagnosis not present

## 2013-05-29 DIAGNOSIS — H2510 Age-related nuclear cataract, unspecified eye: Secondary | ICD-10-CM | POA: Diagnosis not present

## 2013-06-21 DIAGNOSIS — H2510 Age-related nuclear cataract, unspecified eye: Secondary | ICD-10-CM | POA: Diagnosis not present

## 2013-06-28 DIAGNOSIS — H2510 Age-related nuclear cataract, unspecified eye: Secondary | ICD-10-CM | POA: Diagnosis not present

## 2013-07-05 DIAGNOSIS — H2510 Age-related nuclear cataract, unspecified eye: Secondary | ICD-10-CM | POA: Diagnosis not present

## 2013-07-20 HISTORY — PX: CATARACT EXTRACTION: SUR2

## 2013-07-24 DIAGNOSIS — E1065 Type 1 diabetes mellitus with hyperglycemia: Secondary | ICD-10-CM | POA: Diagnosis not present

## 2013-07-24 DIAGNOSIS — E1029 Type 1 diabetes mellitus with other diabetic kidney complication: Secondary | ICD-10-CM | POA: Diagnosis not present

## 2013-07-24 DIAGNOSIS — E785 Hyperlipidemia, unspecified: Secondary | ICD-10-CM | POA: Diagnosis not present

## 2013-07-26 DIAGNOSIS — R5381 Other malaise: Secondary | ICD-10-CM | POA: Diagnosis not present

## 2013-07-26 DIAGNOSIS — N183 Chronic kidney disease, stage 3 unspecified: Secondary | ICD-10-CM | POA: Diagnosis not present

## 2013-07-26 DIAGNOSIS — R5383 Other fatigue: Secondary | ICD-10-CM | POA: Diagnosis not present

## 2013-07-26 DIAGNOSIS — E1065 Type 1 diabetes mellitus with hyperglycemia: Secondary | ICD-10-CM | POA: Diagnosis not present

## 2013-07-26 DIAGNOSIS — E1029 Type 1 diabetes mellitus with other diabetic kidney complication: Secondary | ICD-10-CM | POA: Diagnosis not present

## 2013-07-26 DIAGNOSIS — I10 Essential (primary) hypertension: Secondary | ICD-10-CM | POA: Diagnosis not present

## 2013-07-26 DIAGNOSIS — E785 Hyperlipidemia, unspecified: Secondary | ICD-10-CM | POA: Diagnosis not present

## 2013-07-27 DIAGNOSIS — N319 Neuromuscular dysfunction of bladder, unspecified: Secondary | ICD-10-CM | POA: Diagnosis not present

## 2013-07-27 DIAGNOSIS — N1330 Unspecified hydronephrosis: Secondary | ICD-10-CM | POA: Diagnosis not present

## 2013-07-27 DIAGNOSIS — N3020 Other chronic cystitis without hematuria: Secondary | ICD-10-CM | POA: Diagnosis not present

## 2013-08-01 DIAGNOSIS — Q6689 Other  specified congenital deformities of feet: Secondary | ICD-10-CM | POA: Diagnosis not present

## 2013-08-01 DIAGNOSIS — E119 Type 2 diabetes mellitus without complications: Secondary | ICD-10-CM | POA: Diagnosis not present

## 2013-08-01 DIAGNOSIS — M2040 Other hammer toe(s) (acquired), unspecified foot: Secondary | ICD-10-CM | POA: Diagnosis not present

## 2013-09-12 DIAGNOSIS — R7301 Impaired fasting glucose: Secondary | ICD-10-CM | POA: Diagnosis not present

## 2013-09-16 DIAGNOSIS — J441 Chronic obstructive pulmonary disease with (acute) exacerbation: Secondary | ICD-10-CM | POA: Diagnosis not present

## 2013-09-16 DIAGNOSIS — R0602 Shortness of breath: Secondary | ICD-10-CM | POA: Diagnosis not present

## 2013-09-16 DIAGNOSIS — Z79899 Other long term (current) drug therapy: Secondary | ICD-10-CM | POA: Diagnosis not present

## 2013-09-16 DIAGNOSIS — E119 Type 2 diabetes mellitus without complications: Secondary | ICD-10-CM | POA: Diagnosis not present

## 2013-09-16 DIAGNOSIS — R05 Cough: Secondary | ICD-10-CM | POA: Diagnosis not present

## 2013-09-16 DIAGNOSIS — R059 Cough, unspecified: Secondary | ICD-10-CM | POA: Diagnosis not present

## 2013-09-16 DIAGNOSIS — J449 Chronic obstructive pulmonary disease, unspecified: Secondary | ICD-10-CM | POA: Diagnosis not present

## 2013-09-16 DIAGNOSIS — Z794 Long term (current) use of insulin: Secondary | ICD-10-CM | POA: Diagnosis not present

## 2013-09-29 DIAGNOSIS — IMO0002 Reserved for concepts with insufficient information to code with codable children: Secondary | ICD-10-CM | POA: Diagnosis not present

## 2013-09-29 DIAGNOSIS — J449 Chronic obstructive pulmonary disease, unspecified: Secondary | ICD-10-CM | POA: Diagnosis not present

## 2013-09-29 DIAGNOSIS — I10 Essential (primary) hypertension: Secondary | ICD-10-CM | POA: Diagnosis not present

## 2013-09-29 DIAGNOSIS — E109 Type 1 diabetes mellitus without complications: Secondary | ICD-10-CM | POA: Diagnosis not present

## 2013-09-29 DIAGNOSIS — R413 Other amnesia: Secondary | ICD-10-CM | POA: Diagnosis not present

## 2013-10-25 DIAGNOSIS — E1029 Type 1 diabetes mellitus with other diabetic kidney complication: Secondary | ICD-10-CM | POA: Diagnosis not present

## 2013-10-25 DIAGNOSIS — E785 Hyperlipidemia, unspecified: Secondary | ICD-10-CM | POA: Diagnosis not present

## 2013-10-30 ENCOUNTER — Other Ambulatory Visit: Payer: Self-pay | Admitting: Internal Medicine

## 2013-10-30 DIAGNOSIS — J449 Chronic obstructive pulmonary disease, unspecified: Secondary | ICD-10-CM | POA: Diagnosis not present

## 2013-10-30 DIAGNOSIS — R413 Other amnesia: Secondary | ICD-10-CM

## 2013-10-30 DIAGNOSIS — IMO0002 Reserved for concepts with insufficient information to code with codable children: Secondary | ICD-10-CM | POA: Diagnosis not present

## 2013-10-30 DIAGNOSIS — Z961 Presence of intraocular lens: Secondary | ICD-10-CM | POA: Diagnosis not present

## 2013-11-04 ENCOUNTER — Ambulatory Visit
Admission: RE | Admit: 2013-11-04 | Discharge: 2013-11-04 | Disposition: A | Discharge status: Home / Self Care | Payer: Medicare Other | Source: Ambulatory Visit | Attending: Internal Medicine | Admitting: Internal Medicine

## 2013-11-04 DIAGNOSIS — R413 Other amnesia: Secondary | ICD-10-CM

## 2013-11-04 DIAGNOSIS — G9389 Other specified disorders of brain: Secondary | ICD-10-CM | POA: Diagnosis not present

## 2013-11-20 ENCOUNTER — Ambulatory Visit: Payer: Medicare Other | Admitting: Neurology

## 2013-11-23 ENCOUNTER — Ambulatory Visit (INDEPENDENT_AMBULATORY_CARE_PROVIDER_SITE_OTHER): Payer: Medicare Other | Admitting: Neurology

## 2013-11-23 ENCOUNTER — Encounter: Payer: Self-pay | Admitting: Neurology

## 2013-11-23 ENCOUNTER — Encounter (INDEPENDENT_AMBULATORY_CARE_PROVIDER_SITE_OTHER): Payer: Self-pay

## 2013-11-23 VITALS — BP 133/75 | HR 55 | Temp 97.7°F | Ht 69.0 in | Wt 149.0 lb

## 2013-11-23 DIAGNOSIS — G910 Communicating hydrocephalus: Secondary | ICD-10-CM | POA: Diagnosis not present

## 2013-11-23 DIAGNOSIS — R29818 Other symptoms and signs involving the nervous system: Secondary | ICD-10-CM | POA: Diagnosis not present

## 2013-11-23 DIAGNOSIS — G629 Polyneuropathy, unspecified: Secondary | ICD-10-CM

## 2013-11-23 DIAGNOSIS — G589 Mononeuropathy, unspecified: Secondary | ICD-10-CM

## 2013-11-23 DIAGNOSIS — R2689 Other abnormalities of gait and mobility: Secondary | ICD-10-CM

## 2013-11-23 NOTE — Patient Instructions (Signed)
Please walk regularly for exercise.  I would not push for an LP or shunt. If you wish to pursue a lumbar puncture, let me know.

## 2013-11-23 NOTE — Progress Notes (Addendum)
Subjective:    Patient ID: Benjamin Riddle is a 80 y.o. male.  HPI    Star Age, MD, PhD [facility] [address], [address] [place], [place] [address]  Dear Benjamin Riddle,   I saw your patient, Benjamin Riddle, upon your kind request in my neurologic clinic today for initial consultation of his hydrocephalus. The patient is accompanied by his wife today. 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 Recently he had a brain MRI without contrast on 11/04/2013 which showed: No acute intracranial abnormality. 2. Chronic ventriculomegaly such that normal pressure hydrocephalus is not excluded. However, ventricle size appears stable since 2006. 3. Chronic frontal sinus and frontal bone postoperative changes. A 4-5 cm left subfrontal fluid collection appears to be extradural, and chronic. This may be a chronic postoperative seroma. There is some associated chronic mass effect on and remodeling of the roof of the left orbit. 4. Mild chronic bifrontal encephalomalacia. 5. Chronic sinusitis with other paranasal sinus operative changes. In addition, I have personally reviewed the images through the PACS system. 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 He has never been evaluated for shunt. He does not wish to pursue of shunt. He reports no significant cognitive issues except for mild forgetfulness. In the last 2 years he has noted some problems with his walking and his balance. He has fallen a few times but  often in the context of low blood sugar values. He fell yesterday in the yard and did not injure himself. They checked his blood sugar and it was 60 which is low for him. He had sinus surgery for a tumor and has a scar over his right for head. He has no recurrent headaches. He has never had one-sided weakness. He is wondering whether he may benefit from a large volume lumbar puncture.  His Past Medical History Is Significant For: Past Medical History  Diagnosis Date  . Diabetes mellitus   . Asthma   . Hydronephrosis   . Kidney disease     Stage III chronic kidney disease  . Hypertension   . Neurogenic bladder   . Osteoporosis   . Hypogonadism male   . Recurrent urinary tract infection     With resistent Pseudomonas  . Allergic rhinitis   . Hepatitis C     due to blood transfusion  . Hyperlipidemia   . SOB (shortness of breath)     His Past Surgical History Is Significant For: Past Surgical History  Procedure Laterality Date  . Hernia repair    . Tonsillectomy    . Bladder repair      obstruction surgery  . Frontal sinus obliteration      sinus repaired with a plate  . Cataract extraction  2015    His Family History Is Significant For: Family History  Problem Relation Age of Onset  . Heart failure Mother   . Hypertension Mother   . COPD Father   . Diabetes Father   . Cancer Sister     Breast  . Diabetes Sister   . Lung  disease Brother     His Social History Is Significant For: History   Social History  . Marital Status: Married    Spouse Name: Benjamin Riddle    Number of Children: 0  . Years of Education: MD   Occupational History  . Retired Other   Social History Main Topics  . Smoking status: Former Smoker -- 3.00 packs/day for 20 years    Types: Cigars, Cigarettes    Quit date: 07/20/1984  . Smokeless tobacco: Never Used  . Alcohol Use: No  . Drug Use: No  . Sexual Activity: None   Other Topics Concern  . None   Social History Narrative   Patient  lives at home with spouse.   Caffeine Use: 3 cups daily    His Allergies Are:  Allergies  Allergen Reactions  . Codeine Nausea And Vomiting  . Gentamicin     Becomes dizzy and weak  . Ciprofloxacin Itching and Rash  :   His Current Medications Are:  Outpatient Encounter Prescriptions as of 11/23/2013  Medication Sig  . albuterol (PROVENTIL) (5 MG/ML) 0.5% nebulizer solution Take 0.5 mLs (2.5 mg total) by nebulization every 4 (four) hours as needed for wheezing.  Marland Kitchen albuterol (PROVENTIL,VENTOLIN) 90 MCG/ACT inhaler Inhale 2 puffs into the lungs every 4 (four) hours as needed.    . Cholecalciferol (VITAMIN D) 1000 UNITS capsule Take 1,000 Units by mouth daily.    . Fluticasone-Salmeterol (ADVAIR DISKUS) 100-50 MCG/DOSE AEPB Inhale 1 puff into the lungs 2 (two) times daily.    . insulin glargine (LANTUS) 100 UNIT/ML injection Inject 20 Units into the skin 2 (two) times daily.  . insulin lispro (HUMALOG) 100 UNIT/ML injection Inject 4-10 Units into the skin 4 (four) times daily - after meals and at bedtime.   Marland Kitchen ipratropium (ATROVENT HFA) 17 MCG/ACT inhaler Inhale 2 puffs into the lungs every 6 (six) hours as needed (sob).  . olmesartan (BENICAR) 40 MG tablet Take 40 mg by mouth daily. Per PCP  . Respiratory Therapy Supplies (NEBULIZER COMPRESSOR) KIT 1 kit by Does not apply route 4 (four) times daily as needed.  . simvastatin (ZOCOR) 40 MG tablet Take 40 mg by mouth daily.    :   Review of Systems:  Out of a complete 14 point review of systems, all are reviewed and negative with the exception of these symptoms as listed below:   Review of Systems  Constitutional: Positive for fatigue.       Weight loss  Gastrointestinal:       Urination problems  Allergic/Immunologic: Positive for environmental allergies.    Objective:  Neurologic Exam  Physical Exam Physical Examination:   Filed Vitals:   11/23/13 1033  BP: 133/75  Pulse: 55  Temp: 97.7 F (36.5 C)    General  Examination: The patient is a very pleasant 80 y.o. male in no acute distress. He appears well-developed and well-nourished and well groomed. He is slender.   HEENT: Normocephalic, atraumatic, except for unremarkable scar over it his right forehead. Pupils are equal, round and reactive to light and accommodation. Funduscopic exam is normal with sharp disc margins noted. Extraocular tracking is good without limitation to gaze excursion or nystagmus noted. Normal smooth pursuit is noted. Hearing is grossly intact. Tympanic membranes are not visualized bilaterally due to cerumen impaction. Face is symmetric with normal facial animation and normal facial sensation. Speech is clear with no dysarthria noted. There is no hypophonia. There is no lip, neck/head, jaw or voice tremor.  Neck is supple with full range of passive and active motion. There are no carotid bruits on auscultation. Oropharynx exam reveals: mild mouth dryness, adequate dental hygiene and mild airway crowding.   Chest: Clear to auscultation without wheezing, rhonchi or crackles noted.  Heart: S1+S2+0, regular and normal without murmurs, rubs or gallops noted.   Abdomen: Soft, non-tender and non-distended with normal bowel sounds appreciated on auscultation.  Extremities: There is no pitting edema in the distal lower extremities bilaterally. Pedal pulses are intact.  Skin: Warm and dry without trophic changes noted. There are no varicose veins.  Musculoskeletal: exam reveals no obvious joint deformities, tenderness or joint swelling or erythema.   Neurologically:  Mental status: The patient is awake, alert and oriented in all 4 spheres. His immediate and remote memory, attention, language skills and fund of knowledge are appropriate. There is no evidence of aphasia, agnosia, apraxia or anomia. Speech is clear with normal prosody and enunciation. Thought process is linear. Mood is normal and affect is normal.  Cranial nerves II - XII are  as described above under HEENT exam. In addition: shoulder shrug is normal with equal shoulder height noted. Motor exam: Normal bulk, strength and tone is noted. There is no drift, tremor or rebound. Romberg is significant for swaying. Reflexes are 2+ throughout. Babinski: Toes are flexor bilaterally. Fine motor skills and coordination: intact with normal finger taps, normal hand movements, normal rapid alternating patting, normal foot taps and normal foot agility.  Cerebellar testing: No dysmetria or intention tremor on finger to nose testing. Heel to shin is slightly difficult bilaterally. There is no truncal ataxia.  Sensory exam: intact to light touch, pinprick, vibration, temperature sense in the extremities, but decrease to all modalities in the distal lower extremities up to mid shin areas.  Gait, station and balance: He stands with mild difficulty and pushes himself up. No veering to one side is noted. No leaning to one side is noted. Posture is age-appropriate and stance is very mildly wide-based. He walks slightly insecurely but has no clear shuffling of his walker. He has preserved arm swing and turns in 3 steps with mild insecurity noted while turning. Tandem walk is difficult for him.   Assessment and Plan:   In summary, Benjamin Riddle is a very pleasant 80 y.o.-year old male with a complex underlying medical history of diabetes type 1, recurrent UTI, need for self-catheterization twice daily, history of fractured skull, history of hepatitis C from blood transfusion, allergic rhinitis, chronic lung disease, history of rheumatic fever at age 59, osteoporosis, and hypogonadism, who has a long-standing history of hydrocephalus. Only in the last couple of years has he noted balance and gait problems and he has in fact fallen. His physical exam and his neurological exam are essentially in keeping with neuropathy but beyond that fairly nonfocal. 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 I do not know if he will benefit from a large-volume tap but I think his gait disorders not in keeping with the clinical picture of NPH and his insecure walking may be a function of normal aging and neuropathy. He has not been exercising regularly and I encouraged him to start walking regularly and preferentially use his cane for safety. He has been using a cane off and on. We mutually decided to not pursue large-volume lumbar puncture at this time but if he changes his mind I  would be happy to make a referral for a large-volume tap under x-ray guidance. He is not interested in pursuing a shunt placement and I favor this decision as well. At this juncture, I have advised him to use his cane for safety, eat well and at regular intervals, drink plenty of water and start exercising regularly, preferably in the form of walking. I can see him back on an as-needed basis. I answered all their questions today and the patient and his wife were in agreement. Thank you very much for allowing me to participate in the care of this nice patient. If I can be of any further assistance to you please do not hesitate to call me at [phone].  Sincerely,   Star Age, MD, PhD

## 2013-12-19 DIAGNOSIS — E785 Hyperlipidemia, unspecified: Secondary | ICD-10-CM | POA: Diagnosis not present

## 2013-12-19 DIAGNOSIS — R5381 Other malaise: Secondary | ICD-10-CM | POA: Diagnosis not present

## 2013-12-19 DIAGNOSIS — R5383 Other fatigue: Secondary | ICD-10-CM | POA: Diagnosis not present

## 2013-12-19 DIAGNOSIS — N183 Chronic kidney disease, stage 3 unspecified: Secondary | ICD-10-CM | POA: Diagnosis not present

## 2013-12-19 DIAGNOSIS — E1029 Type 1 diabetes mellitus with other diabetic kidney complication: Secondary | ICD-10-CM | POA: Diagnosis not present

## 2013-12-19 DIAGNOSIS — I10 Essential (primary) hypertension: Secondary | ICD-10-CM | POA: Diagnosis not present

## 2014-03-20 DIAGNOSIS — E1029 Type 1 diabetes mellitus with other diabetic kidney complication: Secondary | ICD-10-CM | POA: Diagnosis not present

## 2014-03-20 DIAGNOSIS — E785 Hyperlipidemia, unspecified: Secondary | ICD-10-CM | POA: Diagnosis not present

## 2014-03-21 DIAGNOSIS — R5383 Other fatigue: Secondary | ICD-10-CM | POA: Diagnosis not present

## 2014-03-21 DIAGNOSIS — R5381 Other malaise: Secondary | ICD-10-CM | POA: Diagnosis not present

## 2014-03-21 DIAGNOSIS — N183 Chronic kidney disease, stage 3 unspecified: Secondary | ICD-10-CM | POA: Diagnosis not present

## 2014-03-21 DIAGNOSIS — E785 Hyperlipidemia, unspecified: Secondary | ICD-10-CM | POA: Diagnosis not present

## 2014-03-21 DIAGNOSIS — E1065 Type 1 diabetes mellitus with hyperglycemia: Secondary | ICD-10-CM | POA: Diagnosis not present

## 2014-03-21 DIAGNOSIS — I10 Essential (primary) hypertension: Secondary | ICD-10-CM | POA: Diagnosis not present

## 2014-03-21 DIAGNOSIS — E1029 Type 1 diabetes mellitus with other diabetic kidney complication: Secondary | ICD-10-CM | POA: Diagnosis not present

## 2014-03-24 DIAGNOSIS — Z23 Encounter for immunization: Secondary | ICD-10-CM | POA: Diagnosis not present

## 2014-04-25 DIAGNOSIS — N1330 Unspecified hydronephrosis: Secondary | ICD-10-CM | POA: Diagnosis not present

## 2014-04-25 DIAGNOSIS — N319 Neuromuscular dysfunction of bladder, unspecified: Secondary | ICD-10-CM | POA: Diagnosis not present

## 2014-04-25 DIAGNOSIS — N3020 Other chronic cystitis without hematuria: Secondary | ICD-10-CM | POA: Diagnosis not present

## 2014-06-07 DIAGNOSIS — M810 Age-related osteoporosis without current pathological fracture: Secondary | ICD-10-CM | POA: Diagnosis not present

## 2014-06-07 DIAGNOSIS — Z008 Encounter for other general examination: Secondary | ICD-10-CM | POA: Diagnosis not present

## 2014-06-07 DIAGNOSIS — E785 Hyperlipidemia, unspecified: Secondary | ICD-10-CM | POA: Diagnosis not present

## 2014-06-07 DIAGNOSIS — E109 Type 1 diabetes mellitus without complications: Secondary | ICD-10-CM | POA: Diagnosis not present

## 2014-06-07 DIAGNOSIS — Z125 Encounter for screening for malignant neoplasm of prostate: Secondary | ICD-10-CM | POA: Diagnosis not present

## 2014-06-20 DIAGNOSIS — E1065 Type 1 diabetes mellitus with hyperglycemia: Secondary | ICD-10-CM | POA: Diagnosis not present

## 2014-06-20 DIAGNOSIS — E785 Hyperlipidemia, unspecified: Secondary | ICD-10-CM | POA: Diagnosis not present

## 2014-06-21 DIAGNOSIS — R413 Other amnesia: Secondary | ICD-10-CM | POA: Diagnosis not present

## 2014-06-21 DIAGNOSIS — Z1389 Encounter for screening for other disorder: Secondary | ICD-10-CM | POA: Diagnosis not present

## 2014-06-21 DIAGNOSIS — K648 Other hemorrhoids: Secondary | ICD-10-CM | POA: Diagnosis not present

## 2014-06-21 DIAGNOSIS — J449 Chronic obstructive pulmonary disease, unspecified: Secondary | ICD-10-CM | POA: Diagnosis not present

## 2014-06-21 DIAGNOSIS — B1920 Unspecified viral hepatitis C without hepatic coma: Secondary | ICD-10-CM | POA: Diagnosis not present

## 2014-06-21 DIAGNOSIS — Z0000 Encounter for general adult medical examination without abnormal findings: Secondary | ICD-10-CM | POA: Diagnosis not present

## 2014-06-21 DIAGNOSIS — F329 Major depressive disorder, single episode, unspecified: Secondary | ICD-10-CM | POA: Diagnosis not present

## 2014-06-21 DIAGNOSIS — E270 Other adrenocortical overactivity: Secondary | ICD-10-CM | POA: Diagnosis not present

## 2014-06-21 DIAGNOSIS — N319 Neuromuscular dysfunction of bladder, unspecified: Secondary | ICD-10-CM | POA: Diagnosis not present

## 2014-06-21 DIAGNOSIS — R269 Unspecified abnormalities of gait and mobility: Secondary | ICD-10-CM | POA: Diagnosis not present

## 2014-06-22 DIAGNOSIS — R5382 Chronic fatigue, unspecified: Secondary | ICD-10-CM | POA: Diagnosis not present

## 2014-06-22 DIAGNOSIS — E780 Pure hypercholesterolemia: Secondary | ICD-10-CM | POA: Diagnosis not present

## 2014-06-22 DIAGNOSIS — E1065 Type 1 diabetes mellitus with hyperglycemia: Secondary | ICD-10-CM | POA: Diagnosis not present

## 2014-06-22 DIAGNOSIS — E1022 Type 1 diabetes mellitus with diabetic chronic kidney disease: Secondary | ICD-10-CM | POA: Diagnosis not present

## 2014-06-22 DIAGNOSIS — I10 Essential (primary) hypertension: Secondary | ICD-10-CM | POA: Diagnosis not present

## 2014-07-10 DIAGNOSIS — M859 Disorder of bone density and structure, unspecified: Secondary | ICD-10-CM | POA: Diagnosis not present

## 2014-08-17 DIAGNOSIS — N319 Neuromuscular dysfunction of bladder, unspecified: Secondary | ICD-10-CM | POA: Diagnosis not present

## 2014-08-17 DIAGNOSIS — N3020 Other chronic cystitis without hematuria: Secondary | ICD-10-CM | POA: Diagnosis not present

## 2014-08-17 DIAGNOSIS — N1330 Unspecified hydronephrosis: Secondary | ICD-10-CM | POA: Diagnosis not present

## 2014-08-28 DIAGNOSIS — Z6820 Body mass index (BMI) 20.0-20.9, adult: Secondary | ICD-10-CM | POA: Diagnosis not present

## 2014-08-28 DIAGNOSIS — M810 Age-related osteoporosis without current pathological fracture: Secondary | ICD-10-CM | POA: Diagnosis not present

## 2014-08-28 DIAGNOSIS — Z79899 Other long term (current) drug therapy: Secondary | ICD-10-CM | POA: Diagnosis not present

## 2014-08-31 ENCOUNTER — Encounter: Payer: Self-pay | Admitting: Internal Medicine

## 2014-08-31 ENCOUNTER — Ambulatory Visit (INDEPENDENT_AMBULATORY_CARE_PROVIDER_SITE_OTHER): Payer: Medicare Other | Admitting: Internal Medicine

## 2014-08-31 ENCOUNTER — Encounter (INDEPENDENT_AMBULATORY_CARE_PROVIDER_SITE_OTHER): Payer: Self-pay

## 2014-08-31 VITALS — BP 124/78 | HR 74 | Ht 72.0 in | Wt 143.4 lb

## 2014-08-31 DIAGNOSIS — J449 Chronic obstructive pulmonary disease, unspecified: Secondary | ICD-10-CM | POA: Insufficient documentation

## 2014-08-31 MED ORDER — MOMETASONE FURO-FORMOTEROL FUM 100-5 MCG/ACT IN AERO: INHALATION_SPRAY | RESPIRATORY_TRACT | Status: DC

## 2014-08-31 MED ORDER — PANTOPRAZOLE SODIUM 40 MG PO TBEC: 40.0000 mg | DELAYED_RELEASE_TABLET | Freq: Every day | ORAL | Status: DC

## 2014-08-31 MED ORDER — FAMOTIDINE 20 MG PO TABS: ORAL_TABLET | ORAL | Status: DC

## 2014-08-31 MED ORDER — PREDNISONE 10 MG PO TABS: ORAL_TABLET | ORAL | Status: DC

## 2014-08-31 NOTE — Patient Instructions (Addendum)
Stop advair and stay off spiriva  Start dulera 100 Take 2 puffs first thing in am and then another 2 puffs about 12 hours later.   Only use your albuterol nebulizer as a rescue medication to be used if you can't catch your breath by resting or doing a relaxed purse lip breathing pattern.  - The less you use it, the better it will work when you need it. - Ok to use up to  every 4 hours if you must but call for immediate appointment if use goes up over your usual need  Prednisone 10 mg take  4 each am x 2 days,   2 each am x 2 days,  1 each am x 2 days and stop   Pantoprazole (protonix) 40 mg   Take 30-60 min before first meal of the day and Pepcid 20 mg one bedtime until return to office - this is the best way to tell whether stomach acid is contributing to your problem.    GERD (REFLUX)  is an extremely common cause of respiratory symptoms just like yours , many times with no obvious heartburn at all.    It can be treated with medication, but also with lifestyle changes including avoidance of late meals, excessive alcohol, smoking cessation, and avoid fatty foods, chocolate, peppermint, colas, red wine, and acidic juices such as orange juice.  NO MINT OR MENTHOL PRODUCTS SO NO COUGH DROPS  USE SUGARLESS CANDY INSTEAD (Jolley ranchers or Stover's or Life Savers) or even ice chips will also do - the key is to swallow to prevent all throat clearing. NO OIL BASED VITAMINS - use powdered substitutes.    Please schedule a follow up office visit in 2 weeks, sooner if needed   - needs cxr on return if not better or check with Dr Perini's office for most recent study

## 2014-08-31 NOTE — Progress Notes (Signed)
   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   08/31/2014 1st [facility] office visit/ Benjamin Riddle  / no better on advair/ spiriva Chief Complaint  Patient presents with  . Pulmonary Consult    Referred by Dr. Waynard EdwardsPerini. Pt c/o SOB for the past 2-3 months. He also c/o prod cough with yellow sputum. He states that he gets SOB with or without exertion.   w/in a year of stopping pipe/cigars noted intermittent sob dx as asthmatic bronchitis rx advair/spiriva / best result in past = rx with prednisone but only maybe 50% back to previous baseline   Uses albuterol neb only 50% better for up to a few hours. Assoc Cough is day > noct, minimally productive   No obvious day to day or daytime variabilty or assoc  or cp or chest tightness, subjective wheeze overt sinus or hb symptoms. No unusual exp hx or h/o childhood pna  or knowledge of premature birth.  Sleeping ok without nocturnal  or early am exacerbation  of respiratory  c/o's or need for noct saba. Also denies any obvious fluctuation of symptoms with weather or environmental changes or other aggravating or alleviating factors except as outlined above   Current Medications, Allergies, Complete Past Medical History, Past Surgical History, Family History, and Social History were reviewed in [facility] electronic medical record.          Review of Systems  Constitutional: Negative for fever, chills, activity change, appetite change and unexpected weight change.  HENT: Negative for congestion, dental problem, postnasal drip, rhinorrhea, sneezing, sore throat, trouble  swallowing and voice change.   Eyes: Negative for visual disturbance.  Respiratory: Positive for cough and shortness of breath. Negative for choking.   Cardiovascular: Negative for chest pain and leg swelling.  Gastrointestinal: Negative for nausea, vomiting and abdominal pain.  Genitourinary: Negative for difficulty urinating.  Musculoskeletal: Negative for arthralgias.  Skin: Negative for rash.  Psychiatric/Behavioral: Negative for behavioral problems and confusion.       Objective:   Physical Exam  amb extremely hoarse male nad  Wt Readings from Last 3 Encounters:  08/31/14 143 lb 6.4 oz (65.046 kg)  11/23/13 149 lb (67.586 kg)  09/14/11 141 lb 6.4 oz (64.139 kg)    Vital signs reviewed   HEENT: nl dentition, turbinates, and orophanx. Nl external ear canals without cough reflex   NECK :  without JVD/Nodes/TM/ nl carotid upstrokes bilaterally   LUNGS: no acc muscle use,  insp and exp rhonchi rhonchi and exp cough    CV:  RRR  no s3 or murmur or increase in P2, no edema   ABD:  soft and nontender with nl excursion in the supine position. No bruits or organomegaly, bowel sounds nl  MS:  warm without deformities, calf tenderness, cyanosis or clubbing  SKIN: warm and dry without lesions    NEURO:  alert, approp, no deficits      no cxr on file       Assessment & Plan:

## 2014-09-01 ENCOUNTER — Encounter: Payer: Self-pay | Admitting: Internal Medicine

## 2014-09-01 NOTE — Assessment & Plan Note (Signed)
DDX of  difficult airways management all start with A and  include Adherence, Ace Inhibitors, Acid Reflux, Active Sinus Disease, Alpha 1 Antitripsin deficiency, Anxiety masquerading as Airways dz,  ABPA,  allergy(esp in young), Aspiration (esp in elderly), Adverse effects of DPI,  Active smokers, plus two Bs  = Bronchiectasis and Beta blocker use..and one C= CHF   Adherence is always the initial "prime suspect" and is a multilayered concern that requires a "trust but verify" approach in every patient - starting with knowing how to use medications, especially inhalers, correctly, keeping up with refills and understanding the fundamental difference between maintenance and prns vs those medications only taken for a very short course and then stopped and not refilled.  The proper method of use, as well as anticipated side effects, of a metered-dose inhaler are discussed and demonstrated to the patient. Improved effectiveness after extensive coaching during this visit to a level of approximately  75% so try dulera 100 2bid  ? Acid (or non-acid) GERD > always difficult to exclude as up to 75% of pts in some series report no assoc GI/ Heartburn symptoms> rec max (24h)  acid suppression and diet restrictions/ reviewed and instructions given in writing.   ? Adverse effects of dpi > try off all dpi's  ? Allergy > Prednisone 10 mg take  4 each am x 2 days,   2 each am x 2 days,  1 each am x 2 days and stop/ eval on fu ov if not better  ? Active sinus dz > sinus Ct next step  See instructions for specific recommendations which were reviewed directly with the patient who was given a copy with highlighter outlining the key components.

## 2014-09-17 ENCOUNTER — Ambulatory Visit: Payer: Medicare Other | Admitting: Internal Medicine

## 2014-09-21 ENCOUNTER — Encounter (HOSPITAL_COMMUNITY): Payer: Self-pay

## 2014-09-21 ENCOUNTER — Ambulatory Visit (HOSPITAL_COMMUNITY)
Admission: RE | Admit: 2014-09-21 | Discharge: 2014-09-21 | Disposition: A | Discharge status: Home / Self Care | Payer: Medicare Other | Source: Ambulatory Visit | Attending: Internal Medicine | Admitting: Internal Medicine

## 2014-09-21 DIAGNOSIS — M810 Age-related osteoporosis without current pathological fracture: Secondary | ICD-10-CM | POA: Insufficient documentation

## 2014-09-21 DIAGNOSIS — E780 Pure hypercholesterolemia: Secondary | ICD-10-CM | POA: Diagnosis not present

## 2014-09-21 DIAGNOSIS — E1065 Type 1 diabetes mellitus with hyperglycemia: Secondary | ICD-10-CM | POA: Diagnosis not present

## 2014-09-21 DIAGNOSIS — E1022 Type 1 diabetes mellitus with diabetic chronic kidney disease: Secondary | ICD-10-CM | POA: Diagnosis not present

## 2014-09-21 MED ORDER — DENOSUMAB 60 MG/ML ~~LOC~~ SOLN
60.0000 mg | Freq: Once | SUBCUTANEOUS | Status: AC
  Administered 2014-09-21: 60 mg via SUBCUTANEOUS
  Filled 2014-09-21: qty 1

## 2014-09-21 NOTE — Progress Notes (Signed)
Pt is currently being treatd for a lung infection with Azithromax. Spoke with Benjamin Sheehanandy Abshire [facility] Pharm D and patient being on an antibiotic for his lung infection  is not a contraindication for receiving Prolia today

## 2014-09-21 NOTE — Progress Notes (Signed)
Uneventful first injection of PROLIA. Pt was discharged after 15 minutes and ambulated to [facility] to go to his car parked outside 2nd floor.

## 2014-09-21 NOTE — Discharge Instructions (Signed)
PROLIA \\[id number] \[id number] injection What is this medicine? DENOSUMAB (den oh sue mab) slows bone breakdown. Prolia is used to treat osteoporosis in women after menopause and in men. Rivka Barbara is used to prevent bone fractures and other bone problems caused by cancer bone metastases. Rivka Barbara is also used to treat giant cell tumor of the bone. This medicine may be used for other purposes; ask your health care provider or pharmacist if you have questions. COMMON BRAND NAME(S): Prolia, XGEVA What should I tell my health care provider before I take this medicine? They need to know if you have any of these conditions: -dental disease -eczema -infection or history of infections -kidney disease or on dialysis -low blood calcium or vitamin D -malabsorption syndrome -scheduled to have surgery or tooth extraction -taking medicine that contains denosumab -thyroid or parathyroid disease -an unusual reaction to denosumab, other medicines, foods, dyes, or preservatives -pregnant or trying to get pregnant -breast-feeding How should I use this medicine? This medicine is for injection under the skin. It is given by a health care professional in a hospital or clinic setting. If you are getting Prolia, a special MedGuide will be given to you by the pharmacist with each prescription and refill. Be sure to read this information carefully each time. For Prolia, talk to your pediatrician regarding the use of this medicine in children. Special care may be needed. For Rivka Barbara, talk to your pediatrician regarding the use of this medicine in children. While this drug may be prescribed for children as young as 13 years for selected conditions, precautions do apply. Overdosage: If you think you've taken too much of this medicine contact a poison control center or emergency room at once. Overdosage: If you think you have taken too much of this medicine contact a poison control center or emergency room at  once. NOTE: This medicine is only for you. Do not share this medicine with others. What if I miss a dose? It is important not to miss your dose. Call your doctor or health care professional if you are unable to keep an appointment. What may interact with this medicine? Do not take this medicine with any of the following medications: -other medicines containing denosumab This medicine may also interact with the following medications: -medicines that suppress the immune system -medicines that treat cancer -steroid medicines like prednisone or cortisone This list may not describe all possible interactions. Give your health care provider a list of all the medicines, herbs, non-prescription drugs, or dietary supplements you use. Also tell them if you smoke, drink alcohol, or use illegal drugs. Some items may interact with your medicine. What should I watch for while using this medicine? Visit your doctor or health care professional for regular checks on your progress. Your doctor or health care professional may order blood tests and other tests to see how you are doing. Call your doctor or health care professional if you get a cold or other infection while receiving this medicine. Do not treat yourself. This medicine may decrease your body's ability to fight infection. You should make sure you get enough calcium and vitamin D while you are taking this medicine, unless your doctor tells you not to. Discuss the foods you eat and the vitamins you take with your health care professional. See your dentist regularly. Brush and floss your teeth as directed. Before you have any dental work done, tell your dentist you are receiving this medicine. Do not become pregnant while taking this medicine or for 5 months after  stopping it. Women should inform their doctor if they wish to become pregnant or think they might be pregnant. There is a potential for serious side effects to an unborn child. Talk to your health  care professional or pharmacist for more information. What side effects may I notice from receiving this medicine? Side effects that you should report to your doctor or health care professional as soon as possible: -allergic reactions like skin rash, itching or hives, swelling of the face, lips, or tongue -breathing problems -chest pain -fast, irregular heartbeat -feeling faint or lightheaded, falls -fever, chills, or any other sign of infection -muscle spasms, tightening, or twitches -numbness or tingling -skin blisters or bumps, or is dry, peels, or red -slow healing or unexplained pain in the mouth or jaw -unusual bleeding or bruising Side effects that usually do not require medical attention (Report these to your doctor or health care professional if they continue or are bothersome.): -muscle pain -stomach upset, gas This list may not describe all possible side effects. Call your doctor for medical advice about side effects. You may report side effects to FDA at 1-800-FDA-1088. Where should I keep my medicine? This medicine is only given in a clinic, doctor's office, or other health care setting and will not be stored at home. NOTE: This sheet is a summary. It may not cover all possible information. If you have questions about this medicine, talk to your doctor, pharmacist, or health care provider.  2015, Elsevier/Gold Standard. (2012-01-04 12:37:47)

## 2014-09-25 DIAGNOSIS — I10 Essential (primary) hypertension: Secondary | ICD-10-CM | POA: Diagnosis not present

## 2014-09-25 DIAGNOSIS — E1065 Type 1 diabetes mellitus with hyperglycemia: Secondary | ICD-10-CM | POA: Diagnosis not present

## 2014-09-25 DIAGNOSIS — E1022 Type 1 diabetes mellitus with diabetic chronic kidney disease: Secondary | ICD-10-CM | POA: Diagnosis not present

## 2014-09-25 DIAGNOSIS — E780 Pure hypercholesterolemia: Secondary | ICD-10-CM | POA: Diagnosis not present

## 2014-09-25 DIAGNOSIS — R5382 Chronic fatigue, unspecified: Secondary | ICD-10-CM | POA: Diagnosis not present

## 2014-09-26 ENCOUNTER — Ambulatory Visit (INDEPENDENT_AMBULATORY_CARE_PROVIDER_SITE_OTHER)
Admission: RE | Admit: 2014-09-26 | Discharge: 2014-09-26 | Disposition: A | Discharge status: Home / Self Care | Payer: Medicare Other | Source: Ambulatory Visit | Attending: Internal Medicine | Admitting: Internal Medicine

## 2014-09-26 ENCOUNTER — Ambulatory Visit (INDEPENDENT_AMBULATORY_CARE_PROVIDER_SITE_OTHER): Payer: Medicare Other | Admitting: Internal Medicine

## 2014-09-26 ENCOUNTER — Encounter: Payer: Self-pay | Admitting: Internal Medicine

## 2014-09-26 VITALS — BP 128/80 | HR 63 | Ht 69.0 in | Wt 143.0 lb

## 2014-09-26 DIAGNOSIS — J449 Chronic obstructive pulmonary disease, unspecified: Secondary | ICD-10-CM

## 2014-09-26 DIAGNOSIS — R0602 Shortness of breath: Secondary | ICD-10-CM | POA: Diagnosis not present

## 2014-09-26 DIAGNOSIS — J45909 Unspecified asthma, uncomplicated: Secondary | ICD-10-CM | POA: Diagnosis not present

## 2014-09-26 DIAGNOSIS — R05 Cough: Secondary | ICD-10-CM | POA: Diagnosis not present

## 2014-09-26 MED ORDER — MOMETASONE FURO-FORMOTEROL FUM 100-5 MCG/ACT IN AERO: INHALATION_SPRAY | RESPIRATORY_TRACT | Status: DC

## 2014-09-26 MED ORDER — PREDNISONE 10 MG PO TABS: ORAL_TABLET | ORAL | Status: DC

## 2014-09-26 NOTE — Patient Instructions (Signed)
Prednisone 10 mg take  4 each am x 2 days,   2 each am x 2 days,  1 each am x 2 days and stop   For cough mucinex dm up to 1200 mg every 12 hours best otc available  Continue dulera 100 Take 2 puffs first thing in am and then another 2 puffs about 12 hours later.   Please remember to go to the  x-ray department downstairs for your tests - we will call you with the results when they are available.     Please schedule a follow up office visit in 6 weeks, call sooner if needed with pfts

## 2014-09-26 NOTE — Progress Notes (Signed)
Quick Note:  Spoke with pt and notified of results per Dr. Wert. Pt verbalized understanding and denied any questions.  ______ 

## 2014-09-26 NOTE — Progress Notes (Signed)
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    History of Present Illness  08/31/2014 1st [facility] office visit/ Benjamin Riddle  / no better on advair/ spiriva Chief Complaint  Patient presents with  . Pulmonary Consult    Referred by Benjamin. Waynard Riddle. Pt c/o SOB for the past 2-3 months. He also c/o prod cough with yellow sputum. He states that he gets SOB with or without exertion.   w/in a year of stopping pipe/cigars noted intermittent sob dx as asthmatic bronchitis rx advair/spiriva / best result in past = rx with prednisone but only maybe 50% back to previous baseline   Uses albuterol neb only 50% better for up to a few hours. Assoc Cough is day > noct, minimally productive  rec Stop advair and stay off spiriva Start dulera 100 Take 2 puffs first thing in am and then another 2 puffs about 12 hours later.  Only use your albuterol nebulizer  As needed Prednisone 10 mg take  4 each am x 2 days,   2 each am x 2 days,  1 each am x 2 days and stop  Pantoprazole (protonix) 40 mg   Take 30-60 min before first meal of the day and Pepcid 20 mg one bedtime until return to office - this is the best way to tell whether stomach acid is contributing to your problem.   GERD  Diet       09/26/2014 f/u ov/Benjamin Riddle re: cough > sob Chief Complaint  Patient presents with  . Follow-up    Pt states his breathing has improved some. Cough is no better. No new co's today.     Not able to take ppi/ not consistent with dulera 100 but no longer needing any saba  Cough day > noct / variably prod yellow mucus  Could not  take ppi due to diarrhea, stopped pepcid also   No obvious day to day or daytime variabilty or assoc cp or chest tightness, subjective wheeze overt sinus or hb symptoms. No unusual exp hx or h/o childhood pna/ asthma or knowledge of premature birth.  Sleeping ok without nocturnal  or early am exacerbation  of respiratory  c/o's or need for noct saba. Also denies any obvious fluctuation of symptoms with weather or environmental changes or other aggravating or alleviating factors except as outlined above   Current Medications, Allergies, Complete Past Medical History, Past Surgical History, Family History, and Social History were reviewed in Benjamin Riddle electronic medical record.  ROS  The following are not active complaints unless bolded sore throat, dysphagia, dental problems, itching, sneezing,  nasal congestion or excess/ purulent secretions, ear ache,   fever, chills, sweats, unintended wt loss, pleuritic or exertional cp, hemoptysis,  orthopnea pnd or leg swelling, presyncope, palpitations, heartburn, abdominal pain, anorexia, nausea, vomiting, diarrhea  or change in bowel or urinary habits, change in stools= diarhhea, better off ppi or urine, dysuria,hematuria,  rash, arthralgias, visual complaints, headache, numbness weakness or ataxia or problems with walking or coordination,  change in mood/affect or memory.  Objective:   Physical Exam  amb moderately  hoarse male nad  09/27/14            143  Wt Readings from Last 3 Encounters:  08/31/14 143 lb 6.4 oz (65.046 kg)  11/23/13 149 lb (67.586 kg)  09/14/11 141 lb 6.4 oz (64.139 kg)    Vital signs reviewed   HEENT: nl dentition, turbinates, and orophanx. Nl external ear canals without cough reflex   NECK :  without JVD/Nodes/TM/ nl carotid upstrokes bilaterally   LUNGS: no acc muscle use,  insp and exp rhonchi rhonchi  Now without exp cough    CV:  RRR  no s3 or murmur or increase in P2, no edema   ABD:  soft and  nontender with nl excursion in the supine position. No bruits or organomegaly, bowel sounds nl  MS:  warm without deformities, calf tenderness, cyanosis or clubbing  SKIN: warm and dry without lesions    NEURO:  alert, approp, no deficits     CXR PA and Lateral:   09/26/2014 :     I personally reviewed images and agree with radiology impression as follows:    Hyper aeration. Peribronchial thickening may indicate bronchitis. No definite pneumonia or effusion.     Assessment & Plan:

## 2014-09-27 ENCOUNTER — Encounter: Payer: Self-pay | Admitting: Internal Medicine

## 2014-09-27 NOTE — Assessment & Plan Note (Addendum)
DDX of  difficult airways management all start with A and  include Adherence, Ace Inhibitors, Acid Reflux, Active Sinus Disease, Alpha 1 Antitripsin deficiency, Anxiety masquerading as Airways dz,  ABPA,  allergy(esp in young), Aspiration (esp in elderly), Adverse effects of DPI,  Active smokers, plus two Bs  = Bronchiectasis and Beta blocker use..and one C= CHF   Adherence is always the initial "prime suspect" and is a multilayered concern that requires a "trust but verify" approach in every patient - starting with knowing how to use medications, especially inhalers, correctly, keeping up with refills and understanding the fundamental difference between maintenance and prns vs those medications only taken for a very short course and then stopped and not refilled.  - not using dulera consistently but def doing better so no reason to change rx now -The proper method of use, as well as anticipated side effects, of a metered-dose inhaler are discussed and demonstrated to the patient. Improved effectiveness after extensive coaching during this visit to a level of approximately  75%   ? Acid (or non-acid) GERD > always difficult to exclude as up to 75% of pts in some series report no assoc GI/ Heartburn symptoms> not able to take ppi due to diarrhea so have not excluded this from the usual list of suspects   ? Allergy > if not improving with consistent dulera 100 rec  .Prednisone 10 mg take  4 each am x 2 days,   2 each am x 2 days,  1 each am x 2 days and stop  ? Active sinus dz > sinus CT next ov if not better but no empiric abx here (diarrhea at baseline)  ? Adverse effect of inhalers > concerned with using higher dose ICS or dpi due to hoarseness which is a little better so will continue to just use the dulera 100 2bid for now

## 2014-10-26 DIAGNOSIS — M810 Age-related osteoporosis without current pathological fracture: Secondary | ICD-10-CM | POA: Diagnosis not present

## 2014-10-26 DIAGNOSIS — J449 Chronic obstructive pulmonary disease, unspecified: Secondary | ICD-10-CM | POA: Diagnosis not present

## 2014-10-26 DIAGNOSIS — Z6821 Body mass index (BMI) 21.0-21.9, adult: Secondary | ICD-10-CM | POA: Diagnosis not present

## 2014-10-26 DIAGNOSIS — E109 Type 1 diabetes mellitus without complications: Secondary | ICD-10-CM | POA: Diagnosis not present

## 2014-10-26 DIAGNOSIS — E875 Hyperkalemia: Secondary | ICD-10-CM | POA: Diagnosis not present

## 2014-10-26 DIAGNOSIS — I10 Essential (primary) hypertension: Secondary | ICD-10-CM | POA: Diagnosis not present

## 2014-10-26 DIAGNOSIS — Z1389 Encounter for screening for other disorder: Secondary | ICD-10-CM | POA: Diagnosis not present

## 2014-11-09 ENCOUNTER — Ambulatory Visit: Payer: Medicare Other | Admitting: Internal Medicine

## 2014-11-23 ENCOUNTER — Encounter: Payer: Self-pay | Admitting: Internal Medicine

## 2014-11-23 ENCOUNTER — Ambulatory Visit (INDEPENDENT_AMBULATORY_CARE_PROVIDER_SITE_OTHER): Payer: Medicare Other | Admitting: Internal Medicine

## 2014-11-23 VITALS — BP 118/66 | HR 80 | Ht 69.5 in | Wt 146.0 lb

## 2014-11-23 DIAGNOSIS — J449 Chronic obstructive pulmonary disease, unspecified: Secondary | ICD-10-CM | POA: Diagnosis not present

## 2014-11-23 LAB — PULMONARY FUNCTION TEST
DL/VA % PRED: 120 %
DL/VA: 5.47 ml/min/mmHg/L
DLCO unc % pred: 50 %
DLCO unc: 16.07 ml/min/mmHg
FEF 25-75 Post: 0.45 L/sec
FEF 25-75 Pre: 0.47 L/sec
FEF2575-%Change-Post: -4 %
FEF2575-%Pred-Post: 26 %
FEF2575-%Pred-Pre: 27 %
FEV1-%Change-Post: 0 %
FEV1-%PRED-POST: 42 %
FEV1-%Pred-Pre: 43 %
FEV1-POST: 1.12 L
FEV1-Pre: 1.13 L
FEV1FVC-%CHANGE-POST: 1 %
FEV1FVC-%Pred-Pre: 71 %
FEV6-%Change-Post: -3 %
FEV6-%PRED-PRE: 61 %
FEV6-%Pred-Post: 59 %
FEV6-POST: 2.08 L
FEV6-PRE: 2.15 L
FEV6FVC-%CHANGE-POST: 0 %
FEV6FVC-%PRED-POST: 103 %
FEV6FVC-%PRED-PRE: 103 %
FVC-%Change-Post: -2 %
FVC-%PRED-PRE: 59 %
FVC-%Pred-Post: 57 %
FVC-PRE: 2.22 L
FVC-Post: 2.16 L
POST FEV6/FVC RATIO: 96 %
PRE FEV6/FVC RATIO: 97 %
Post FEV1/FVC ratio: 52 %
Pre FEV1/FVC ratio: 51 %
RV % pred: 132 %
RV: 3.61 L
TLC % PRED: 83 %
TLC: 5.84 L

## 2014-11-23 MED ORDER — TIOTROPIUM BROMIDE MONOHYDRATE 2.5 MCG/ACT IN AERS: INHALATION_SPRAY | RESPIRATORY_TRACT | Status: DC

## 2014-11-23 NOTE — Patient Instructions (Signed)
Work on inhaler technique:  relax and gently blow all the way out then take a nice smooth deep breath back in, triggering the inhaler at same time you start breathing in.  Hold for up to 5 seconds if you can and out through the nose.  Rinse and gargle with water when done  Add spiriva 2 puffs each am after dulera and fill the rx if you note better activity tolerance/ breathing with exertion  Please schedule a follow up visit in 3 months but call sooner if needed

## 2014-11-23 NOTE — Progress Notes (Signed)
PFT done today. 

## 2014-11-23 NOTE — Progress Notes (Signed)
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 Pulmonary Consult    Referred by Benjamin. Waynard Riddle. Pt c/o SOB for the past 2-3 months. He also c/o prod cough with yellow sputum. He states that he gets SOB with or without exertion.   w/in a year of stopping pipe/cigars noted intermittent sob dx as asthmatic bronchitis rx advair/spiriva / best result in past = rx with prednisone but only maybe 50% back to previous baseline   Uses albuterol neb only 50% better for up to a few hours. Assoc Cough is day > noct, minimally productive  rec Stop advair and stay off spiriva Start dulera 100 Take 2 puffs first thing in am and then another 2 puffs about 12 hours later.  Only use your albuterol nebulizer  As needed Prednisone 10 mg take  4 each am x 2 days,   2 each am x 2 days,  1 each am x 2 days and stop  Pantoprazole (protonix) 40 mg   Take 30-60 min before first meal of the day and Pepcid 20 mg one bedtime until return to office - this is the best way to tell whether stomach acid is contributing to your problem.   GERD  Diet       09/26/2014 f/u ov/Benjamin Riddle re: cough > sob Chief Complaint  Patient presents with  . Follow-up    Pt states his breathing has improved some. Cough is no better. No new co's today.     Not able to take ppi/ not consistent with dulera 100 but no longer needing any saba  Cough day > noct /  variably prod yellow mucus  Could not take ppi due to diarrhea, stopped pepcid also  rec Prednisone 10 mg take  4 each am x 2 days,   2 each am x 2 days,  1 each am x 2 days and stop  For cough mucinex dm up to 1200 mg every 12 hours best otc available Continue dulera 100 Take 2 puffs first thing in am and then another 2 puffs about 12 hours later   11/23/2014 f/u ov/Benjamin Riddle re: GOLD II copd  Chief Complaint  Patient presents with  . Follow-up    PFT done today.  Pt states his breathing is unchanged since last visit. Cough is some better. No new co's today. Has not needed albuterol inhaler or neb.   walking around neighborhood doe x 5 min /worse with inclines but nl pace = [facility] 1   No obvious day to day or daytime variabilty or assoc cp or chest tightness, subjective wheeze overt sinus or hb symptoms. No unusual exp hx or h/o childhood pna/ asthma or knowledge of premature birth.  Sleeping ok without nocturnal  or early am exacerbation  of respiratory  c/o's or need for noct saba. Also denies any obvious fluctuation  of symptoms with weather or environmental changes or other aggravating or alleviating factors except as outlined above   Current Medications, Allergies, Complete Past Medical History, Past Surgical History, Family History, and Social History were reviewed in Benjamin Riddle [facility] electronic medical record.  ROS  The following are not active complaints unless bolded sore throat, dysphagia, dental problems, itching, sneezing,  nasal congestion or excess/ purulent secretions, ear ache,   fever, chills, sweats, unintended wt loss, pleuritic or exertional cp, hemoptysis,  orthopnea pnd or leg swelling, presyncope, palpitations, heartburn, abdominal pain, anorexia, nausea, vomiting, diarrhea  or change in bowel or urinary habits, change in stools= diarhhea,  or urine, dysuria,hematuria,  rash, arthralgias, visual complaints, headache, numbness weakness or ataxia or problems with walking or  coordination,  change in mood/affect or memory.          Objective:   Physical Exam  amb moderately hoarse male nad  09/27/14            143  > 11/23/2014 146  Wt Readings from Last 3 Encounters:  08/31/14 143 lb 6.4 oz (65.046 kg)  11/23/13 149 lb (67.586 kg)  09/14/11 141 lb 6.4 oz (64.139 kg)    Vital signs reviewed   HEENT: nl dentition, turbinates, and orophanx. Nl external ear canals without cough reflex   NECK :  without JVD/Nodes/TM/ nl carotid upstrokes bilaterally   LUNGS: no acc muscle use,  insp and exp rhonchi rhonchi  Now without exp cough    CV:  RRR  no s3 or murmur or increase in P2, no edema   ABD:  soft and nontender with nl excursion in the supine position. No bruits or organomegaly, bowel sounds nl  MS:  warm without deformities, calf tenderness, cyanosis or clubbing  SKIN: warm and dry without lesions    NEURO:  alert, approp, no deficits     CXR PA and Lateral:   09/26/2014 :     I personally reviewed images and agree with radiology impression as follows:    Hyper aeration. Peribronchial thickening may indicate bronchitis. No definite pneumonia or effusion.     Assessment & Plan:

## 2014-11-24 ENCOUNTER — Encounter: Payer: Self-pay | Admitting: Internal Medicine

## 2014-11-24 NOTE — Assessment & Plan Note (Addendum)
PFTs 11/23/2014   FEV1  1.12 (42%) ratio 52 with no resp so saba p am dulera and dlco 50% corrects to 120    The proper method of use, as well as anticipated side effects, of a metered-dose inhaler are discussed and demonstrated to the patient. Improved effectiveness after extensive coaching during this visit to a level of approximately   75% > try spiriva respimat  I had an extended discussion with the patient reviewing all relevant studies completed to date and  lasting 15 to 20 minutes of a 25 minute visit on the following ongoing concerns:   1) his copd is more advanced than I thought it would be  2) still symptomatic with desired level of activity and no further response to saba on today's pfts >perfect candidate for lama > try adding spiriva respimat  3) Each maintenance medication was reviewed in detail including most importantly the difference between maintenance and as needed and under what circumstances the prns are to be used.  Please see instructions for details which were reviewed in writing and the patient given a copy.

## 2014-11-29 ENCOUNTER — Emergency Department (HOSPITAL_COMMUNITY): Payer: Medicare Other

## 2014-11-29 ENCOUNTER — Encounter (HOSPITAL_COMMUNITY): Payer: Self-pay | Admitting: Emergency Medicine

## 2014-11-29 ENCOUNTER — Emergency Department (HOSPITAL_COMMUNITY)
Admission: EM | Admit: 2014-11-29 | Discharge: 2014-11-29 | Disposition: A | Discharge status: Home / Self Care | Payer: Medicare Other | Attending: Emergency Medicine | Admitting: Emergency Medicine

## 2014-11-29 DIAGNOSIS — N183 Chronic kidney disease, stage 3 (moderate): Secondary | ICD-10-CM | POA: Insufficient documentation

## 2014-11-29 DIAGNOSIS — Z79899 Other long term (current) drug therapy: Secondary | ICD-10-CM | POA: Diagnosis not present

## 2014-11-29 DIAGNOSIS — Z87891 Personal history of nicotine dependence: Secondary | ICD-10-CM | POA: Insufficient documentation

## 2014-11-29 DIAGNOSIS — Z794 Long term (current) use of insulin: Secondary | ICD-10-CM | POA: Diagnosis not present

## 2014-11-29 DIAGNOSIS — J45909 Unspecified asthma, uncomplicated: Secondary | ICD-10-CM | POA: Diagnosis not present

## 2014-11-29 DIAGNOSIS — R739 Hyperglycemia, unspecified: Secondary | ICD-10-CM

## 2014-11-29 DIAGNOSIS — Z8739 Personal history of other diseases of the musculoskeletal system and connective tissue: Secondary | ICD-10-CM | POA: Insufficient documentation

## 2014-11-29 DIAGNOSIS — E785 Hyperlipidemia, unspecified: Secondary | ICD-10-CM | POA: Insufficient documentation

## 2014-11-29 DIAGNOSIS — R059 Cough, unspecified: Secondary | ICD-10-CM

## 2014-11-29 DIAGNOSIS — R05 Cough: Secondary | ICD-10-CM | POA: Diagnosis not present

## 2014-11-29 DIAGNOSIS — Z8619 Personal history of other infectious and parasitic diseases: Secondary | ICD-10-CM | POA: Diagnosis not present

## 2014-11-29 DIAGNOSIS — R197 Diarrhea, unspecified: Secondary | ICD-10-CM | POA: Insufficient documentation

## 2014-11-29 DIAGNOSIS — E1165 Type 2 diabetes mellitus with hyperglycemia: Secondary | ICD-10-CM | POA: Diagnosis not present

## 2014-11-29 DIAGNOSIS — Z87440 Personal history of urinary (tract) infections: Secondary | ICD-10-CM | POA: Diagnosis not present

## 2014-11-29 DIAGNOSIS — I129 Hypertensive chronic kidney disease with stage 1 through stage 4 chronic kidney disease, or unspecified chronic kidney disease: Secondary | ICD-10-CM | POA: Diagnosis not present

## 2014-11-29 LAB — URINALYSIS, ROUTINE W REFLEX MICROSCOPIC
BILIRUBIN URINE: NEGATIVE
KETONES UR: NEGATIVE mg/dL
Leukocytes, UA: NEGATIVE
Nitrite: NEGATIVE
PH: 6 (ref 5.0–8.0)
Protein, ur: NEGATIVE mg/dL
Specific Gravity, Urine: 1.013 (ref 1.005–1.030)
Urobilinogen, UA: 0.2 mg/dL (ref 0.0–1.0)

## 2014-11-29 LAB — URINE MICROSCOPIC-ADD ON

## 2014-11-29 LAB — CBG MONITORING, ED
GLUCOSE-CAPILLARY: 85 mg/dL (ref 65–99)
Glucose-Capillary: 300 mg/dL — ABNORMAL HIGH (ref 65–99)

## 2014-11-29 LAB — COMPREHENSIVE METABOLIC PANEL
ALK PHOS: 46 U/L (ref 38–126)
ALT: 22 U/L (ref 17–63)
ANION GAP: 8 (ref 5–15)
AST: 21 U/L (ref 15–41)
Albumin: 4.5 g/dL (ref 3.5–5.0)
BILIRUBIN TOTAL: 0.6 mg/dL (ref 0.3–1.2)
BUN: 57 mg/dL — AB (ref 6–20)
CHLORIDE: 106 mmol/L (ref 101–111)
CO2: 25 mmol/L (ref 22–32)
CREATININE: 1.87 mg/dL — AB (ref 0.61–1.24)
Calcium: 9.6 mg/dL (ref 8.9–10.3)
GFR calc non Af Amer: 31 mL/min — ABNORMAL LOW (ref >60)
GFR, EST AFRICAN AMERICAN: 36 mL/min — AB (ref >60)
GLUCOSE: 277 mg/dL — AB (ref 65–99)
Potassium: 4.5 mmol/L (ref 3.5–5.1)
Sodium: 139 mmol/L (ref 135–145)
Total Protein: 7.5 g/dL (ref 6.5–8.1)

## 2014-11-29 LAB — CBC
HEMATOCRIT: 43.7 % (ref 39.0–52.0)
HEMOGLOBIN: 14.3 g/dL (ref 13.0–17.0)
MCH: 30.5 pg (ref 26.0–34.0)
MCHC: 32.7 g/dL (ref 30.0–36.0)
MCV: 93.2 fL (ref 78.0–100.0)
Platelets: 424 10*3/uL — ABNORMAL HIGH (ref 150–400)
RBC: 4.69 MIL/uL (ref 4.22–5.81)
RDW: 13.5 % (ref 11.5–15.5)
WBC: 9.5 10*3/uL (ref 4.0–10.5)

## 2014-11-29 MED ORDER — SODIUM CHLORIDE 0.9 % IV BOLUS (SEPSIS)
1000.0000 mL | Freq: Once | INTRAVENOUS | Status: AC
  Administered 2014-11-29: 1000 mL via INTRAVENOUS

## 2014-11-29 NOTE — ED Notes (Signed)
Patient transported to X-ray 

## 2014-11-29 NOTE — ED Notes (Addendum)
Patient states that he self-catheterizes twice daily. Patient will attempt to urinate without catheter. If unsuccessful, we will use a hospital in-and-out catheter

## 2014-11-29 NOTE — ED Notes (Signed)
Pt ambulatory wuth use of cane, pt a&ox4, questions concerns denied r/t dc

## 2014-11-29 NOTE — ED Notes (Signed)
Pt is aware of need for urine sample. Pt reports he I&O catheterizes self BID x 15 years

## 2014-11-29 NOTE — ED Provider Notes (Signed)
CSN: [id number]     Arrival date & time 11/29/14  1423 History   First MD Initiated Contact with Patient 11/29/14 1507     Chief Complaint  Patient presents with  . Hyperglycemia  . Diarrhea     (Consider location/radiation/quality/duration/timing/severity/associated sxs/prior Treatment) Patient is a 81 y.o. male presenting with hyperglycemia and diarrhea. The history is provided by the patient.  Hyperglycemia Blood sugar level PTA:  600 Severity:  Severe Onset quality:  Gradual Duration:  8 days Timing:  Intermittent Progression:  Worsening Chronicity:  New Diabetes status:  Controlled with insulin Current diabetic therapy:  Insulin Context: recent illness   Relieved by:  Nothing Associated symptoms: vomiting   Associated symptoms: no abdominal pain   Risk factors: no obesity   Diarrhea Associated symptoms: vomiting   Associated symptoms: no abdominal pain   Pt had vomiting and diarrhea last week. Pt reports glucose is continuing to be high.   Pt had glucose of 500 today, he gave himself 10 units and glucose dropped to 300.   Pt and wife are concerned that he has an infection that is causing him to have elevated glucose levels.   Pt is a retired MD  Past Medical History  Diagnosis Date  . Diabetes mellitus   . Asthma   . Hydronephrosis   . Kidney disease     Stage III chronic kidney disease  . Hypertension   . Neurogenic bladder   . Osteoporosis   . Hypogonadism male   . Recurrent urinary tract infection     With resistent Pseudomonas  . Allergic rhinitis   . Hepatitis C     due to blood transfusion  . Hyperlipidemia   . SOB (shortness of breath)    Past Surgical History  Procedure Laterality Date  . Hernia repair    . Tonsillectomy    . Bladder repair      obstruction surgery  . Frontal sinus obliteration      sinus repaired with a plate  . Cataract extraction  2015   Family History  Problem Relation Age of Onset  . Heart failure Mother   .  Hypertension Mother   . COPD Father     smoked  . Diabetes Father   . Cancer Sister     Breast  . Diabetes Sister   . COPD Brother     smoked   History  Substance Use Topics  . Smoking status: Former Smoker    Types: Cigars, Cigarettes    Quit date: 07/21/2011  . Smokeless tobacco: Not on file     Comment: smoked occ cigar and pipe  . Alcohol Use: No    Review of Systems  Gastrointestinal: Positive for vomiting and diarrhea. Negative for abdominal pain.  All other systems reviewed and are negative.     Allergies  Codeine; Gentamicin; and Ciprofloxacin  Home Medications   Prior to Admission medications   Medication Sig Start Date End Date Taking? Authorizing Provider  albuterol (PROVENTIL) (5 MG/ML) 0.5% nebulizer solution Take 0.5 mLs (2.5 mg total) by nebulization every 4 (four) hours as needed for wheezing. 09/14/11 11/23/14  Rhetta MuraJai-Gurmukh Samtani, MD  albuterol (PROVENTIL,VENTOLIN) 90 MCG/ACT inhaler Inhale 2 puffs into the lungs every 4 (four) hours as needed.      Historical Provider, MD  cholecalciferol (VITAMIN D) 1000 UNITS tablet Take 1,000 Units by mouth daily.    Historical Provider, MD  insulin lispro (HUMALOG) 100 UNIT/ML injection Inject 4-10 Units into the skin 4 (  four) times daily - after meals and at bedtime.     Historical Provider, MD  mometasone-formoterol (DULERA) 100-5 MCG/ACT AERO Take 2 puffs first thing in am and then another 2 puffs about 12 hours later. 09/26/14   Nyoka CowdenMichael B Wert, MD  olmesartan (BENICAR) 20 MG tablet Take 10 mg by mouth daily.    Historical Provider, MD  simvastatin (ZOCOR) 40 MG tablet Take 40 mg by mouth daily.      Historical Provider, MD  Tiotropium Bromide Monohydrate (SPIRIVA RESPIMAT) 2.5 MCG/ACT AERS 2 puffs each am 11/23/14   Nyoka CowdenMichael B Wert, MD   BP 138/88 mmHg  Pulse 81  Temp(Src) 97.5 F (36.4 C) (Oral)  Resp 18  SpO2 100% Physical Exam  Constitutional: He is oriented to person, place, and time. He appears well-developed  and well-nourished.  HENT:  Head: Normocephalic and atraumatic.  Nose: Nose normal.  Mouth/Throat: Oropharynx is clear and moist.  Eyes: Conjunctivae and EOM are normal. Pupils are equal, round, and reactive to light.  Neck: Normal range of motion.  Cardiovascular: Normal rate and normal heart sounds.   Pulmonary/Chest: Effort normal.  Abdominal: Soft. He exhibits no distension.  Musculoskeletal: Normal range of motion.  Neurological: He is alert and oriented to person, place, and time.  Skin: Skin is warm.  Psychiatric: He has a normal mood and affect.  Nursing note and vitals reviewed.   ED Course  Procedures (including critical care time) Labs Review Labs Reviewed  CBC - Abnormal; Notable for the following:    Platelets 424 (*)    All other components within normal limits  CBG MONITORING, ED - Abnormal; Notable for the following:    Glucose-Capillary 300 (*)    All other components within normal limits  COMPREHENSIVE METABOLIC PANEL  URINALYSIS, ROUTINE W REFLEX MICROSCOPIC   Results for orders placed or performed during the hospital encounter of 11/29/14  CBC  Result Value Ref Range   WBC 9.5 4.0 - 10.5 K/uL   RBC 4.69 4.22 - 5.81 MIL/uL   Hemoglobin 14.3 13.0 - 17.0 g/dL   HCT 14.743.7 82.939.0 - 56.252.0 %   MCV 93.2 78.0 - 100.0 fL   MCH 30.5 26.0 - 34.0 pg   MCHC 32.7 30.0 - 36.0 g/dL   RDW 13.013.5 86.511.5 - 78.415.5 %   Platelets 424 (H) 150 - 400 K/uL  Comprehensive metabolic panel  Result Value Ref Range   Sodium 139 135 - 145 mmol/L   Potassium 4.5 3.5 - 5.1 mmol/L   Chloride 106 101 - 111 mmol/L   CO2 25 22 - 32 mmol/L   Glucose, Bld 277 (H) 65 - 99 mg/dL   BUN 57 (H) 6 - 20 mg/dL   Creatinine, Ser 6.961.87 (H) 0.61 - 1.24 mg/dL   Calcium 9.6 8.9 - 29.510.3 mg/dL   Total Protein 7.5 6.5 - 8.1 g/dL   Albumin 4.5 3.5 - 5.0 g/dL   AST 21 15 - 41 U/L   ALT 22 17 - 63 U/L   Alkaline Phosphatase 46 38 - 126 U/L   Total Bilirubin 0.6 0.3 - 1.2 mg/dL   GFR calc non Af Amer 31 (L) >60  mL/min   GFR calc Af Amer 36 (L) >60 mL/min   Anion gap 8 5 - 15  Urinalysis, Routine w reflex microscopic  Result Value Ref Range   Color, Urine YELLOW YELLOW   APPearance CLEAR CLEAR   Specific Gravity, Urine 1.013 1.005 - 1.030   pH 6.0 5.0 -  8.0   Glucose, UA >1000 (A) NEGATIVE mg/dL   Hgb urine dipstick TRACE (A) NEGATIVE   Bilirubin Urine NEGATIVE NEGATIVE   Ketones, ur NEGATIVE NEGATIVE mg/dL   Protein, ur NEGATIVE NEGATIVE mg/dL   Urobilinogen, UA 0.2 0.0 - 1.0 mg/dL   Nitrite NEGATIVE NEGATIVE   Leukocytes, UA NEGATIVE NEGATIVE  Urine microscopic-add on  Result Value Ref Range   WBC, UA 0-2 <3 WBC/hpf   RBC / HPF 0-2 <3 RBC/hpf  CBG monitoring, ED  Result Value Ref Range   Glucose-Capillary 300 (H) 65 - 99 mg/dL  CBG monitoring, ED  Result Value Ref Range   Glucose-Capillary 85 65 - 99 mg/dL   Dg Chest 2 View  1/61/0960   CLINICAL DATA:  Cough.  EXAM: CHEST  2 VIEW  COMPARISON:  09/26/2014 and chest CT dated 05/31/2003  FINDINGS: Heart size and pulmonary vascularity are normal. There is tortuosity and calcification of the thoracic aorta. The patient has chronic obstructive lung disease. There is chronic nodular pleural thickening superiorly on the left. Chronic accentuation of the lower thoracic kyphosis. No infiltrates or effusions.  IMPRESSION: No acute abnormality.  Emphysema.   Electronically Signed   By: Francene Boyers M.D.   On: 11/29/2014 16:57    Imaging Review Dg Chest 2 View  11/29/2014   CLINICAL DATA:  Cough.  EXAM: CHEST  2 VIEW  COMPARISON:  09/26/2014 and chest CT dated 05/31/2003  FINDINGS: Heart size and pulmonary vascularity are normal. There is tortuosity and calcification of the thoracic aorta. The patient has chronic obstructive lung disease. There is chronic nodular pleural thickening superiorly on the left. Chronic accentuation of the lower thoracic kyphosis. No infiltrates or effusions.  IMPRESSION: No acute abnormality.  Emphysema.   Electronically  Signed   By: Francene Boyers M.D.   On: 11/29/2014 16:57     EKG Interpretation None      MDM   Final diagnoses:  Cough  Hyperglycemia    Pt given iv fluids x 1 liter.  Pt feels better. Glucose cbg 85.  Dr. Karma Ganja in to see and examine.   Pt advised to see his MD for recheck.    Elson Areas, PA-C 11/29/14 1910  Elson Areas, PA-C 11/29/14 1911  Jerelyn Scott, MD 11/29/14 [phone]

## 2014-11-29 NOTE — ED Notes (Signed)
Pt aware urine sample is needed. Pt prefers to cath himself, PA ok with pt request

## 2014-11-29 NOTE — Discharge Instructions (Signed)

## 2014-11-29 NOTE — ED Notes (Addendum)
Pt c/o hyperglycemia, diarrhea since last Thursday. Pt sts he had some vomiting last week but that has subsided. Pt denies Nausea, fevers, chest pain. Pt A&Ox4 and ambulatory with cane. Pt sts diarrhea started today. Pt denies abdominal pain. Pt is a retired Development worker, communityphysician. Pt took a shot of 10 units of Novolog about 30 minutes ago after noting a CBG of 500. Pt's cbg at this time is 300.

## 2014-12-11 DIAGNOSIS — Z1211 Encounter for screening for malignant neoplasm of colon: Secondary | ICD-10-CM | POA: Diagnosis not present

## 2014-12-11 DIAGNOSIS — Z1212 Encounter for screening for malignant neoplasm of rectum: Secondary | ICD-10-CM | POA: Diagnosis not present

## 2014-12-24 DIAGNOSIS — E780 Pure hypercholesterolemia: Secondary | ICD-10-CM | POA: Diagnosis not present

## 2014-12-24 DIAGNOSIS — E1065 Type 1 diabetes mellitus with hyperglycemia: Secondary | ICD-10-CM | POA: Diagnosis not present

## 2014-12-24 DIAGNOSIS — E1022 Type 1 diabetes mellitus with diabetic chronic kidney disease: Secondary | ICD-10-CM | POA: Diagnosis not present

## 2014-12-26 DIAGNOSIS — E1065 Type 1 diabetes mellitus with hyperglycemia: Secondary | ICD-10-CM | POA: Diagnosis not present

## 2014-12-26 DIAGNOSIS — I10 Essential (primary) hypertension: Secondary | ICD-10-CM | POA: Diagnosis not present

## 2014-12-26 DIAGNOSIS — R5382 Chronic fatigue, unspecified: Secondary | ICD-10-CM | POA: Diagnosis not present

## 2014-12-26 DIAGNOSIS — E1022 Type 1 diabetes mellitus with diabetic chronic kidney disease: Secondary | ICD-10-CM | POA: Diagnosis not present

## 2014-12-26 DIAGNOSIS — E780 Pure hypercholesterolemia: Secondary | ICD-10-CM | POA: Diagnosis not present

## 2015-01-04 ENCOUNTER — Encounter (HOSPITAL_COMMUNITY): Payer: Self-pay | Admitting: Emergency Medicine

## 2015-01-04 ENCOUNTER — Inpatient Hospital Stay (HOSPITAL_COMMUNITY)
Admission: EM | Admit: 2015-01-04 | Discharge: 2015-01-07 | DRG: 690 | Disposition: A | Discharge status: Home / Self Care | Payer: Medicare Other | Attending: Internal Medicine | Admitting: Internal Medicine

## 2015-01-04 ENCOUNTER — Emergency Department (HOSPITAL_COMMUNITY): Payer: Medicare Other

## 2015-01-04 DIAGNOSIS — J449 Chronic obstructive pulmonary disease, unspecified: Secondary | ICD-10-CM | POA: Diagnosis not present

## 2015-01-04 DIAGNOSIS — J45909 Unspecified asthma, uncomplicated: Secondary | ICD-10-CM | POA: Diagnosis present

## 2015-01-04 DIAGNOSIS — B1920 Unspecified viral hepatitis C without hepatic coma: Secondary | ICD-10-CM | POA: Diagnosis present

## 2015-01-04 DIAGNOSIS — Z87891 Personal history of nicotine dependence: Secondary | ICD-10-CM

## 2015-01-04 DIAGNOSIS — R112 Nausea with vomiting, unspecified: Secondary | ICD-10-CM | POA: Diagnosis not present

## 2015-01-04 DIAGNOSIS — E11649 Type 2 diabetes mellitus with hypoglycemia without coma: Secondary | ICD-10-CM | POA: Diagnosis not present

## 2015-01-04 DIAGNOSIS — N183 Chronic kidney disease, stage 3 unspecified: Secondary | ICD-10-CM | POA: Diagnosis present

## 2015-01-04 DIAGNOSIS — Z833 Family history of diabetes mellitus: Secondary | ICD-10-CM | POA: Diagnosis not present

## 2015-01-04 DIAGNOSIS — Z6820 Body mass index (BMI) 20.0-20.9, adult: Secondary | ICD-10-CM

## 2015-01-04 DIAGNOSIS — E860 Dehydration: Secondary | ICD-10-CM | POA: Diagnosis present

## 2015-01-04 DIAGNOSIS — M810 Age-related osteoporosis without current pathological fracture: Secondary | ICD-10-CM | POA: Diagnosis present

## 2015-01-04 DIAGNOSIS — R05 Cough: Secondary | ICD-10-CM

## 2015-01-04 DIAGNOSIS — N179 Acute kidney failure, unspecified: Secondary | ICD-10-CM | POA: Diagnosis present

## 2015-01-04 DIAGNOSIS — E119 Type 2 diabetes mellitus without complications: Secondary | ICD-10-CM

## 2015-01-04 DIAGNOSIS — Z881 Allergy status to other antibiotic agents status: Secondary | ICD-10-CM

## 2015-01-04 DIAGNOSIS — E1122 Type 2 diabetes mellitus with diabetic chronic kidney disease: Secondary | ICD-10-CM | POA: Diagnosis present

## 2015-01-04 DIAGNOSIS — Z8249 Family history of ischemic heart disease and other diseases of the circulatory system: Secondary | ICD-10-CM

## 2015-01-04 DIAGNOSIS — E1129 Type 2 diabetes mellitus with other diabetic kidney complication: Secondary | ICD-10-CM | POA: Diagnosis not present

## 2015-01-04 DIAGNOSIS — R634 Abnormal weight loss: Secondary | ICD-10-CM | POA: Diagnosis not present

## 2015-01-04 DIAGNOSIS — E1165 Type 2 diabetes mellitus with hyperglycemia: Secondary | ICD-10-CM | POA: Diagnosis present

## 2015-01-04 DIAGNOSIS — R059 Cough, unspecified: Secondary | ICD-10-CM

## 2015-01-04 DIAGNOSIS — I5042 Chronic combined systolic (congestive) and diastolic (congestive) heart failure: Secondary | ICD-10-CM | POA: Diagnosis present

## 2015-01-04 DIAGNOSIS — I129 Hypertensive chronic kidney disease with stage 1 through stage 4 chronic kidney disease, or unspecified chronic kidney disease: Secondary | ICD-10-CM | POA: Diagnosis present

## 2015-01-04 DIAGNOSIS — R404 Transient alteration of awareness: Secondary | ICD-10-CM | POA: Diagnosis not present

## 2015-01-04 DIAGNOSIS — Z885 Allergy status to narcotic agent status: Secondary | ICD-10-CM

## 2015-01-04 DIAGNOSIS — R0602 Shortness of breath: Secondary | ICD-10-CM | POA: Diagnosis not present

## 2015-01-04 DIAGNOSIS — Z79899 Other long term (current) drug therapy: Secondary | ICD-10-CM

## 2015-01-04 DIAGNOSIS — R197 Diarrhea, unspecified: Secondary | ICD-10-CM | POA: Diagnosis present

## 2015-01-04 DIAGNOSIS — Z794 Long term (current) use of insulin: Secondary | ICD-10-CM | POA: Diagnosis not present

## 2015-01-04 DIAGNOSIS — R531 Weakness: Secondary | ICD-10-CM | POA: Diagnosis not present

## 2015-01-04 DIAGNOSIS — I10 Essential (primary) hypertension: Secondary | ICD-10-CM | POA: Diagnosis present

## 2015-01-04 DIAGNOSIS — Z87440 Personal history of urinary (tract) infections: Secondary | ICD-10-CM

## 2015-01-04 DIAGNOSIS — E291 Testicular hypofunction: Secondary | ICD-10-CM | POA: Diagnosis present

## 2015-01-04 DIAGNOSIS — N319 Neuromuscular dysfunction of bladder, unspecified: Secondary | ICD-10-CM | POA: Diagnosis present

## 2015-01-04 DIAGNOSIS — E785 Hyperlipidemia, unspecified: Secondary | ICD-10-CM | POA: Diagnosis present

## 2015-01-04 DIAGNOSIS — B958 Unspecified staphylococcus as the cause of diseases classified elsewhere: Secondary | ICD-10-CM | POA: Diagnosis present

## 2015-01-04 DIAGNOSIS — N3000 Acute cystitis without hematuria: Secondary | ICD-10-CM | POA: Diagnosis not present

## 2015-01-04 DIAGNOSIS — E440 Moderate protein-calorie malnutrition: Secondary | ICD-10-CM | POA: Diagnosis present

## 2015-01-04 DIAGNOSIS — N390 Urinary tract infection, site not specified: Principal | ICD-10-CM | POA: Diagnosis present

## 2015-01-04 DIAGNOSIS — I5040 Unspecified combined systolic (congestive) and diastolic (congestive) heart failure: Secondary | ICD-10-CM | POA: Diagnosis present

## 2015-01-04 DIAGNOSIS — K868 Other specified diseases of pancreas: Secondary | ICD-10-CM | POA: Diagnosis not present

## 2015-01-04 HISTORY — DX: Chronic kidney disease, stage 3 (moderate): N18.3

## 2015-01-04 HISTORY — DX: Unspecified combined systolic (congestive) and diastolic (congestive) heart failure: I50.40

## 2015-01-04 HISTORY — DX: Chronic kidney disease, stage 3 unspecified: N18.30

## 2015-01-04 HISTORY — DX: Chronic obstructive pulmonary disease, unspecified: J44.9

## 2015-01-04 LAB — CBC WITH DIFFERENTIAL/PLATELET
BASOS ABS: 0 10*3/uL (ref 0.0–0.1)
Basophils Relative: 0 % (ref 0–1)
EOS PCT: 0 % (ref 0–5)
Eosinophils Absolute: 0 10*3/uL (ref 0.0–0.7)
HCT: 41.6 % (ref 39.0–52.0)
Hemoglobin: 14.1 g/dL (ref 13.0–17.0)
Lymphocytes Relative: 9 % — ABNORMAL LOW (ref 12–46)
Lymphs Abs: 1.4 10*3/uL (ref 0.7–4.0)
MCH: 30.7 pg (ref 26.0–34.0)
MCHC: 33.9 g/dL (ref 30.0–36.0)
MCV: 90.6 fL (ref 78.0–100.0)
MONO ABS: 1.1 10*3/uL — AB (ref 0.1–1.0)
MONOS PCT: 7 % (ref 3–12)
NEUTROS ABS: 13.4 10*3/uL — AB (ref 1.7–7.7)
Neutrophils Relative %: 84 % — ABNORMAL HIGH (ref 43–77)
Platelets: 394 10*3/uL (ref 150–400)
RBC: 4.59 MIL/uL (ref 4.22–5.81)
RDW: 13.3 % (ref 11.5–15.5)
WBC: 16 10*3/uL — ABNORMAL HIGH (ref 4.0–10.5)

## 2015-01-04 LAB — I-STAT CG4 LACTIC ACID, ED
LACTIC ACID, VENOUS: 1.27 mmol/L (ref 0.5–2.0)
Lactic Acid, Venous: 1.24 mmol/L (ref 0.5–2.0)

## 2015-01-04 LAB — URINALYSIS, ROUTINE W REFLEX MICROSCOPIC
Bilirubin Urine: NEGATIVE
Glucose, UA: 250 mg/dL — AB
KETONES UR: NEGATIVE mg/dL
Nitrite: NEGATIVE
PH: 5.5 (ref 5.0–8.0)
Protein, ur: 30 mg/dL — AB
SPECIFIC GRAVITY, URINE: 1.012 (ref 1.005–1.030)
UROBILINOGEN UA: 0.2 mg/dL (ref 0.0–1.0)

## 2015-01-04 LAB — URINE MICROSCOPIC-ADD ON

## 2015-01-04 LAB — COMPREHENSIVE METABOLIC PANEL
ALBUMIN: 3.9 g/dL (ref 3.5–5.0)
ALT: 15 U/L — ABNORMAL LOW (ref 17–63)
AST: 18 U/L (ref 15–41)
Alkaline Phosphatase: 48 U/L (ref 38–126)
Anion gap: 13 (ref 5–15)
BILIRUBIN TOTAL: 0.8 mg/dL (ref 0.3–1.2)
BUN: 74 mg/dL — ABNORMAL HIGH (ref 6–20)
CALCIUM: 9.2 mg/dL (ref 8.9–10.3)
CO2: 21 mmol/L — AB (ref 22–32)
Chloride: 101 mmol/L (ref 101–111)
Creatinine, Ser: 2.43 mg/dL — ABNORMAL HIGH (ref 0.61–1.24)
GFR calc Af Amer: 27 mL/min — ABNORMAL LOW (ref >60)
GFR calc non Af Amer: 23 mL/min — ABNORMAL LOW (ref >60)
GLUCOSE: 134 mg/dL — AB (ref 65–99)
POTASSIUM: 4.9 mmol/L (ref 3.5–5.1)
Sodium: 135 mmol/L (ref 135–145)
Total Protein: 6.6 g/dL (ref 6.5–8.1)

## 2015-01-04 LAB — CBG MONITORING, ED: GLUCOSE-CAPILLARY: 136 mg/dL — AB (ref 65–99)

## 2015-01-04 LAB — LIPASE, BLOOD: Lipase: 35 U/L (ref 22–51)

## 2015-01-04 LAB — I-STAT TROPONIN, ED: Troponin i, poc: 0.01 ng/mL (ref 0.00–0.08)

## 2015-01-04 MED ORDER — HYDRALAZINE HCL 20 MG/ML IJ SOLN
5.0000 mg | INTRAMUSCULAR | Status: DC | PRN
  Administered 2015-01-07: 5 mg via INTRAVENOUS
  Filled 2015-01-04: qty 1

## 2015-01-04 MED ORDER — FAMOTIDINE 20 MG PO TABS
20.0000 mg | ORAL_TABLET | Freq: Two times a day (BID) | ORAL | Status: DC
  Administered 2015-01-04 – 2015-01-06 (×4): 20 mg via ORAL
  Filled 2015-01-04 (×4): qty 1

## 2015-01-04 MED ORDER — IPRATROPIUM-ALBUTEROL 0.5-2.5 (3) MG/3ML IN SOLN
3.0000 mL | Freq: Two times a day (BID) | RESPIRATORY_TRACT | Status: DC
  Administered 2015-01-05 – 2015-01-07 (×5): 3 mL via RESPIRATORY_TRACT
  Filled 2015-01-04 (×5): qty 3

## 2015-01-04 MED ORDER — HEPARIN SODIUM (PORCINE) 5000 UNIT/ML IJ SOLN
5000.0000 [IU] | Freq: Three times a day (TID) | INTRAMUSCULAR | Status: DC
  Administered 2015-01-04 – 2015-01-07 (×8): 5000 [IU] via SUBCUTANEOUS
  Filled 2015-01-04 (×10): qty 1

## 2015-01-04 MED ORDER — DEXTROSE 5 % IV SOLN
1.0000 g | INTRAVENOUS | Status: DC
  Administered 2015-01-05 – 2015-01-06 (×2): 1 g via INTRAVENOUS
  Filled 2015-01-04 (×2): qty 10

## 2015-01-04 MED ORDER — VITAMIN D3 25 MCG (1000 UNIT) PO TABS
1000.0000 [IU] | ORAL_TABLET | Freq: Every day | ORAL | Status: DC
  Administered 2015-01-05 – 2015-01-06 (×2): 1000 [IU] via ORAL
  Filled 2015-01-04 (×3): qty 1

## 2015-01-04 MED ORDER — SODIUM CHLORIDE 0.9 % IV BOLUS (SEPSIS)
1000.0000 mL | Freq: Once | INTRAVENOUS | Status: AC
  Administered 2015-01-04: 1000 mL via INTRAVENOUS

## 2015-01-04 MED ORDER — IPRATROPIUM-ALBUTEROL 0.5-2.5 (3) MG/3ML IN SOLN: 3.0000 mL | RESPIRATORY_TRACT | Status: DC

## 2015-01-04 MED ORDER — DM-GUAIFENESIN ER 30-600 MG PO TB12
1.0000 | ORAL_TABLET | Freq: Two times a day (BID) | ORAL | Status: DC
  Administered 2015-01-04 – 2015-01-07 (×6): 1 via ORAL
  Filled 2015-01-04 (×7): qty 1

## 2015-01-04 MED ORDER — SIMVASTATIN 40 MG PO TABS
40.0000 mg | ORAL_TABLET | Freq: Every day | ORAL | Status: DC
  Administered 2015-01-04: 40 mg via ORAL
  Filled 2015-01-04 (×2): qty 1

## 2015-01-04 MED ORDER — INSULIN GLARGINE 100 UNIT/ML ~~LOC~~ SOLN
3.0000 [IU] | Freq: Every day | SUBCUTANEOUS | Status: DC
  Administered 2015-01-04: 3 [IU] via SUBCUTANEOUS
  Filled 2015-01-04: qty 0.03

## 2015-01-04 MED ORDER — ALBUTEROL SULFATE (2.5 MG/3ML) 0.083% IN NEBU: 2.5000 mg | INHALATION_SOLUTION | RESPIRATORY_TRACT | Status: DC | PRN

## 2015-01-04 MED ORDER — CEFTRIAXONE SODIUM 1 G IJ SOLR
1.0000 g | Freq: Once | INTRAMUSCULAR | Status: AC
  Administered 2015-01-04: 1 g via INTRAVENOUS
  Filled 2015-01-04: qty 10

## 2015-01-04 MED ORDER — ZOLPIDEM TARTRATE 5 MG PO TABS
5.0000 mg | ORAL_TABLET | Freq: Every day | ORAL | Status: DC | PRN
  Administered 2015-01-05 – 2015-01-06 (×2): 5 mg via ORAL
  Filled 2015-01-04 (×2): qty 1

## 2015-01-04 MED ORDER — AMLODIPINE BESYLATE 5 MG PO TABS
5.0000 mg | ORAL_TABLET | Freq: Every day | ORAL | Status: DC
  Administered 2015-01-04 – 2015-01-06 (×3): 5 mg via ORAL
  Filled 2015-01-04 (×4): qty 1

## 2015-01-04 MED ORDER — ONDANSETRON HCL 4 MG/2ML IJ SOLN: 4.0000 mg | Freq: Three times a day (TID) | INTRAMUSCULAR | Status: DC | PRN

## 2015-01-04 MED ORDER — SODIUM CHLORIDE 0.9 % IJ SOLN
3.0000 mL | Freq: Two times a day (BID) | INTRAMUSCULAR | Status: DC
  Administered 2015-01-05 – 2015-01-07 (×3): 3 mL via INTRAVENOUS

## 2015-01-04 MED ORDER — SODIUM CHLORIDE 0.9 % IV SOLN
INTRAVENOUS | Status: DC
  Administered 2015-01-05 (×3): via INTRAVENOUS

## 2015-01-04 MED ORDER — MOMETASONE FURO-FORMOTEROL FUM 100-5 MCG/ACT IN AERO
2.0000 | INHALATION_SPRAY | Freq: Two times a day (BID) | RESPIRATORY_TRACT | Status: DC | PRN
  Filled 2015-01-04: qty 8.8

## 2015-01-04 NOTE — ED Notes (Addendum)
Pt from home via [facility] c/o n/v/d and weakness x 1 week. He reports hyperglycemia at home as high as 500's CBG en route 121. Pt recently on antibiotics for UTI. He self cath's. Pt alert and oriented. 12 lead en route unremarkable. 4mg  Zofran given enroute via  20 G IV left AC.

## 2015-01-04 NOTE — H&P (Signed)
[facility] History and Physical  Rohail Schmoldt MVH:[id number] DOB: 1927-10-25 DOA: 01/04/2015  Referring physician: ED physician PCP: Ezequiel Kayser, MD  Specialists:   Chief Complaint: Nausea, vomiting and diarrhea  HPI: Benjamin Riddle is a 81 y.o. male with PMH of IDDM, asthma, COPD, CKD-III, DM, GERD,  HTN, neurogenic bladder (doing self cath), osteoporosis, recurrent UTI, Hep C, combined systolic and diastolic congestive heart failure, who nausea and vomiting.   Patient reports that he has been having intermittent nausea, vomiting and diarrhea in the past 2 weeks, which has been worsening in the past 3 days. He is taking Imodium with little help. He lost 20 pounds in the past 2 weeks per his wife. He feels generalized weak. He does not have abdominal pain. He reports that he used the doxycycline for UTI before nausea, vomiting and diarrhea started. He is doing self cath because of neurogenic bladder and has dysuria recently. He has some mild cough and shortness of breath because of COPD, which has not changed from baseline. No chest pain, fever, chills, unilateral weakness.  In ED, patient was found to have positive urinalysis, lactate 1.27, troponin negative, lipase 35, WBC 16.0, no tachycardia, AoCKD-III. abdominal CT/pelvis showed no acute abnormalities, but with chronic renal cortical atrophy and chronic/recurrent urinary tract inflammation with hydroureter and generalized urothelial thickening without associated inflammatory stranding.  Where does patient live?   At home    Can patient participate in ADLs?   Little   Review of Systems:   General: no fevers, chills, has body weight loss, has poor appetite, has fatigue HEENT: no blurry vision, hearing changes or sore throat Pulm: has mild dyspnea, coughing, wheezing CV: no chest pain, palpitations Abd: has nausea, vomiting,  Diarrhea, no constipation, abdominal pain, GU: has dysuria, no burning on urination, increased urinary  frequency, hematuria  Ext: no leg edema Neuro: no unilateral weakness, numbness, or tingling, no vision change or hearing loss Skin: no rash MSK: No muscle spasm, no deformity, no limitation of range of movement in spin Heme: No easy bruising.  Travel history: No recent long distant travel.  Allergy:  Allergies  Allergen Reactions  . Codeine Nausea And Vomiting  . Gentamicin     Becomes dizzy and weak  . Ciprofloxacin Itching and Rash    Past Medical History  Diagnosis Date  . Diabetes mellitus   . Asthma   . Hydronephrosis   . Kidney disease     Stage III chronic kidney disease  . Hypertension   . Neurogenic bladder   . Osteoporosis   . Hypogonadism male   . Recurrent urinary tract infection     With resistent Pseudomonas  . Allergic rhinitis   . Hepatitis C     due to blood transfusion  . Hyperlipidemia   . SOB (shortness of breath)   . CKD (chronic kidney disease), stage III   . Combined systolic and diastolic congestive heart failure   . COPD (chronic obstructive pulmonary disease)     Past Surgical History  Procedure Laterality Date  . Hernia repair    . Tonsillectomy    . Bladder repair      obstruction surgery  . Frontal sinus obliteration      sinus repaired with a plate  . Cataract extraction  2015    Social History:  reports that he quit smoking about 3 years ago. His smoking use included Cigars and Cigarettes. He does not have any smokeless tobacco history on file. He reports that he  does not drink alcohol or use illicit drugs.  Family History:  Family History  Problem Relation Age of Onset  . Heart failure Mother   . Hypertension Mother   . COPD Father     smoked  . Diabetes Father   . Cancer Sister     Breast  . Diabetes Sister   . COPD Brother     smoked     Prior to Admission medications   Medication Sig Start Date End Date Taking? Authorizing Provider  albuterol (2.5 MG/3ML) 0.083% NEBU 3 mL, albuterol (5 MG/ML) 0.5% NEBU 0.5 mL  Inhale 5 mg into the lungs every 6 (six) hours as needed (wheezing).   Yes Historical Provider, MD  cholecalciferol (VITAMIN D) 1000 UNITS tablet Take 1,000 Units by mouth daily.   Yes Historical Provider, MD  famotidine (PEPCID) 20 MG tablet Take 1 tablet by mouth 2 (two) times daily. To help 'lung condition' 11/21/14  Yes Historical Provider, MD  insulin lispro (HUMALOG) 100 UNIT/ML injection Inject 4-8 Units into the skin 3 (three) times daily as needed for high blood sugar (high blood sugar). Adjusted to blood glucose level. Patient adds an additional unit for every 50mg /dL   Yes Historical Provider, MD  mometasone-formoterol (DULERA) 100-5 MCG/ACT AERO Take 2 puffs first thing in am and then another 2 puffs about 12 hours later. Patient taking differently: Inhale 2 puffs into the lungs 2 (two) times daily as needed for wheezing (wheezing).  09/26/14  Yes Nyoka Cowden, MD  olmesartan (BENICAR) 20 MG tablet Take 10 mg by mouth daily.   Yes Historical Provider, MD  simvastatin (ZOCOR) 40 MG tablet Take 40 mg by mouth daily.     Yes Historical Provider, MD  Tiotropium Bromide Monohydrate (SPIRIVA RESPIMAT) 2.5 MCG/ACT AERS 2 puffs each am Patient taking differently: Take 1 puff by mouth daily.  11/23/14  Yes Nyoka Cowden, MD  zolpidem (AMBIEN) 5 MG tablet Take 5 mg by mouth daily as needed for sleep (sleep).  10/14/14  Yes Historical Provider, MD  albuterol (PROVENTIL) (5 MG/ML) 0.5% nebulizer solution Take 0.5 mLs (2.5 mg total) by nebulization every 4 (four) hours as needed for wheezing. 09/14/11 11/23/14  Rhetta Mura, MD  albuterol (PROVENTIL,VENTOLIN) 90 MCG/ACT inhaler Inhale 2 puffs into the lungs every 4 (four) hours as needed.      Historical Provider, MD    Physical Exam: Filed Vitals:   01/04/15 1600 01/04/15 1630 01/04/15 1856 01/04/15 2224  BP: 142/71 144/78 160/89 131/77  Pulse:   91 79  Temp:   98.1 F (36.7 C) 98 F (36.7 C)  TempSrc:   Rectal Oral  Resp: 15 13 20 14   SpO2:    96% 97%   General: Not in acute distress. Dry mucous and membrane HEENT:       Eyes: PERRL, EOMI, no scleral icterus.       ENT: No discharge from the ears and nose, no pharynx injection, no tonsillar enlargement.        Neck: No JVD, no bruit, no mass felt. Heme: No neck lymph node enlargement. Cardiac: S1/S2, RRR, No murmurs, No gallops or rubs. Pulm: decreased air movement bilaterally. Has very mild wheezing bilaterally, N rales or rubs. Abd: Soft, nondistended, nontender, no rebound pain, no organomegaly, BS present. Ext: No pitting leg edema bilaterally. 2+DP/PT pulse bilaterally. Musculoskeletal: No joint deformities, No joint redness or warmth, no limitation of ROM in spin. Skin: No rashes.  Neuro: Alert, oriented X3, cranial nerves  II-XII grossly intact, muscle strength 4/5 in all extremities, sensation to light touch intact.  Psych: Patient is not psychotic, no suicidal or hemocidal ideation.  Labs on Admission:  Basic Metabolic Panel:  Recent Labs Lab 01/04/15 1551  NA 135  K 4.9  CL 101  CO2 21*  GLUCOSE 134*  BUN 74*  CREATININE 2.43*  CALCIUM 9.2   Liver Function Tests:  Recent Labs Lab 01/04/15 1551  AST 18  ALT 15*  ALKPHOS 48  BILITOT 0.8  PROT 6.6  ALBUMIN 3.9    Recent Labs Lab 01/04/15 1551  LIPASE 35   No results for input(s): AMMONIA in the last 168 hours. CBC:  Recent Labs Lab 01/04/15 1551  WBC 16.0*  NEUTROABS 13.4*  HGB 14.1  HCT 41.6  MCV 90.6  PLT 394   Cardiac Enzymes: No results for input(s): CKTOTAL, CKMB, CKMBINDEX, TROPONINI in the last 168 hours.  BNP (last 3 results) No results for input(s): BNP in the last 8760 hours.  ProBNP (last 3 results) No results for input(s): PROBNP in the last 8760 hours.  CBG:  Recent Labs Lab 01/04/15 1836  GLUCAP 136*    Radiological Exams on Admission: Ct Abdomen Pelvis Wo Contrast  01/04/2015   CLINICAL DATA:  81 year old male with nausea vomiting weakness abdominal  pain and weight loss. Current history of hepatitis C and chronic kidney disease. Initial encounter.  EXAM: CT ABDOMEN AND PELVIS WITHOUT CONTRAST  TECHNIQUE: Multidetector CT imaging of the abdomen and pelvis was performed following the standard protocol without IV contrast.  COMPARISON:  Renal ultrasound 07/01/2010.  Chest CT 05/31/2003  FINDINGS: Mild respiratory motion artifact at the lung bases. No pericardial or pleural effusion. No confluent lung base opacity.  Degenerative changes throughout the spine. No acute osseous abnormality identified.  Small right inguinal hernia, predominantly fat containing. This does not appear to contain bowel. Evidence of previous left inguinal hernia repair. Up to 4 cm right posterior bladder urinary bladder diverticulum. Small are 2 cm left posterior bladder diverticulum versus distal hydroureter. Largely decompressed bladder, with moderate bladder wall thickening. No perivesical stranding.  Gas in the rectum. Decompressed sigmoid colon. Redundant sigmoid. No distal colon inflammation. Negative left colon and splenic flexure aside from retained stool. Negative transverse colon and right colon aside from retained stool. Negative terminal ileum. Appendix not identified. No pericecal inflammation. No dilated small bowel. Oral contrast administered but has not yet reached the distal small bowel. Decompressed stomach and duodenum.  Noncontrast liver, gallbladder, spleen, and adrenal glands are within normal limits. Pancreatic atrophy. No abdominal free fluid.  Extensive Aortoiliac calcified atherosclerosis noted.  Abnormal kidneys and renal collecting systems. Bilateral renal cortical atrophy with lobulation. Dilated right renal pelvis with mild urothelial thickening (series 2, image 39). No adjacent inflammatory stranding. Rapid tapering of the right ureteral pelvic junction, although there is right hydroureter throughout much of its course, with mild urothelial thickening but no  periureteral stranding. Smaller left renal pelvis dilatation. Left hydroureter and urothelial thickening similar to that on the right. No urologic calculus identified.  No lymphadenopathy identified.  No free air.  IMPRESSION: 1. No definite acute process identified in the abdomen or pelvis. 2. Chronic renal cortical atrophy and chronic/recurrent urinary tract inflammation suspected, with hydroureter an generalized urothelial thickening without associated inflammatory stranding. Associated urinary bladder wall thickening, bladder diverticula versus distal hydroureter. No urologic calculus identified. 3. Pancreatic atrophy.   Electronically Signed   By: Odessa Fleming M.D.   On: 01/04/2015 19:47  Dg Chest 2 View  01/04/2015   CLINICAL DATA:  Weakness for 1 week, shortness of breath, cough on and off for 2 weeks.  EXAM: CHEST  2 VIEW  COMPARISON:  11/29/2014  FINDINGS: The heart size and mediastinal contours are within normal limits. Both lungs are clear. The visualized skeletal structures are unremarkable.  IMPRESSION: No active cardiopulmonary disease.   Electronically Signed   By: Elige Ko   On: 01/04/2015 17:16    EKG: Independently reviewed.  No ischemic change.   Assessment/Plan Principal Problem:   Nausea vomiting and diarrhea Active Problems:   Diabetes mellitus without complication   HLD (hyperlipidemia)   Essential hypertension   Asthma   Neurogenic bladder   COPD GOLD III    UTI (lower urinary tract infection)   Acute renal failure superimposed on stage 3 chronic kidney disease   Weakness   Combined systolic and diastolic congestive heart failure  Nausea vomiting and diarrhea: Etiology is not clear. Lipase negative. CT abdomen has no acute issues. Likely due to viral infection, but need to rule out other possibilities such as C. difficile colitis. Patient is not septic, but a dehydrated on admission.  -will admit to tele bed -check C diff pcr if has diarrhea again -IVF: 1L NS and  then 125 cc/h -prn zofran for nasuea  AoCKD-III: Baseline Cre is 1.7, his Cre 2.43 and a BUN 74 is on admission. Likely due to prerenal secondary to dehydration and continuation of ARB. No hydronephrosis, but has possible hydroureter on CT abdomen/pelvis, which is likely due to urinary retention from neurogenic bladder. -IVF as above -Check FeNa -Follow up renal function by BMP -Hold Benicar  DM-II: Last A1c 8.2 on 09/12/11, not controled. Patient is taking Humalog at home -will start low-dose of Lantus, 3 units daily -SSI -Check A1c  HLD: No LDL records -Continue home medications: Zocor -Check FLP  Essential hypertension: -hold benicar -start amlodipine -IV hydralazine prn  Asthma and CODP: Patient has decreased airway movement bilaterally and very mild wheezing bilaterally, does not seem to have acute exacerbation. -DuoNeb and albuterol when necessary -Dulera -mucinex prn for cough  Neurogenic bladder and UTI:  -self cath prn -Rocephin IV -follow up blood and urine culture  Combined systolic and diastolic congestive heart failure: 2-D echo on 09/08/11 showed EF of 45-2% with grade 1 diastolic dysfunction. Patient is clinically dry. He is not taking any diuretics at home. -watch volume status closely.   DVT ppx: SQ Heparin      Code Status: Full code Family Communication:   Yes, patient's  wife     at bed side Disposition Plan: Admit to inpatient   Date of Service 01/04/2015    Lorretta Harp [facility] Pager [phone]  If 7PM-7AM, please contact night-coverage [email or web address] Password [facility] 01/04/2015, 10:37 PM

## 2015-01-04 NOTE — ED Provider Notes (Signed)
CSN: [id number]     Arrival date & time 01/04/15  1531 History   First MD Initiated Contact with Patient 01/04/15 1555     Chief Complaint  Patient presents with  . Emesis  . Diarrhea     (Consider location/radiation/quality/duration/timing/severity/associated sxs/prior Treatment) The history is provided by the patient and medical records. No language interpreter was used.     Raghav Verrilli is a 81 y.o. male  with a hx of IDDM, asthma, CKD, HTN, neurogenic bladder, osteoporosis, recurrent UTI, Hep C presents to the Emergency Department complaining of intermittent nausea and vomiting onset 2 weeks ago and worsening over the last 3 days.  Emesis is all postprandial.  No associated abd pain.  Pt reports he has lost a significant amount of weight in the last 2 weeks (total of 21 lbs). Associated symptoms include intermittent soft stools without melena or hematochezia.  Pt reports he is sometimes able to keep down diet soda, ice chips and a little bit of ensure.  Pt's wifre reports that he has become so weak from this that he is unable to walk from the bed to the bathroom.  No treatments PTA or antiemetics.  Nothing makes it better and nothing makes it worse.  Wife reports "crazy" blood sugars.  This AM it was 580 and yesterday 600 and reuqiring increased insulin boluses to decrease the blood sugar.  Pt denies fever, chills, headache, neck pain, chest pain, SOB, abd pain, syncope.  Inguinal hernia repair in 1970; no other abdominal surgeries.     Past Medical History  Diagnosis Date  . Diabetes mellitus   . Asthma   . Hydronephrosis   . Kidney disease     Stage III chronic kidney disease  . Hypertension   . Neurogenic bladder   . Osteoporosis   . Hypogonadism male   . Recurrent urinary tract infection     With resistent Pseudomonas  . Allergic rhinitis   . Hepatitis C     due to blood transfusion  . Hyperlipidemia   . SOB (shortness of breath)   . CKD (chronic kidney disease), stage III    . Combined systolic and diastolic congestive heart failure   . COPD (chronic obstructive pulmonary disease)    Past Surgical History  Procedure Laterality Date  . Hernia repair    . Tonsillectomy    . Bladder repair      obstruction surgery  . Frontal sinus obliteration      sinus repaired with a plate  . Cataract extraction  2015   Family History  Problem Relation Age of Onset  . Heart failure Mother   . Hypertension Mother   . COPD Father     smoked  . Diabetes Father   . Cancer Sister     Breast  . Diabetes Sister   . COPD Brother     smoked   History  Substance Use Topics  . Smoking status: Former Smoker    Types: Cigars, Cigarettes    Quit date: 07/21/2011  . Smokeless tobacco: Not on file     Comment: smoked occ cigar and pipe  . Alcohol Use: No    Review of Systems  Constitutional: Negative for fever, diaphoresis, appetite change, fatigue and unexpected weight change.  HENT: Negative for mouth sores.   Eyes: Negative for visual disturbance.  Respiratory: Negative for cough, chest tightness, shortness of breath and wheezing.   Cardiovascular: Negative for chest pain.  Gastrointestinal: Positive for nausea, vomiting and diarrhea.  Negative for abdominal pain and constipation.  Endocrine: Negative for polydipsia, polyphagia and polyuria.  Genitourinary: Negative for dysuria, urgency, frequency and hematuria.  Musculoskeletal: Negative for back pain and neck stiffness.  Skin: Negative for rash.  Allergic/Immunologic: Negative for immunocompromised state.  Neurological: Negative for syncope, light-headedness and headaches.  Hematological: Does not bruise/bleed easily.  Psychiatric/Behavioral: Negative for sleep disturbance. The patient is not nervous/anxious.       Allergies  Codeine; Gentamicin; and Ciprofloxacin  Home Medications   Prior to Admission medications   Medication Sig Start Date End Date Taking? Authorizing Provider  albuterol (2.5  MG/3ML) 0.083% NEBU 3 mL, albuterol (5 MG/ML) 0.5% NEBU 0.5 mL Inhale 5 mg into the lungs every 6 (six) hours as needed (wheezing).   Yes Historical Provider, MD  cholecalciferol (VITAMIN D) 1000 UNITS tablet Take 1,000 Units by mouth daily.   Yes Historical Provider, MD  famotidine (PEPCID) 20 MG tablet Take 1 tablet by mouth 2 (two) times daily. To help 'lung condition' 11/21/14  Yes Historical Provider, MD  insulin lispro (HUMALOG) 100 UNIT/ML injection Inject 4-8 Units into the skin 3 (three) times daily as needed for high blood sugar (high blood sugar). Adjusted to blood glucose level. Patient adds an additional unit for every /dL   Yes Historical Provider, MD  mometasone-formoterol (DULERA) 100-5 MCG/ACT AERO Take 2 puffs first thing in am and then another 2 puffs about 12 hours later. Patient taking differently: Inhale 2 puffs into the lungs 2 (two) times daily as needed for wheezing (wheezing).  09/26/14  Yes Nyoka Cowden, MD  olmesartan (BENICAR) 20 MG tablet Take 10 mg by mouth daily.   Yes Historical Provider, MD  simvastatin (ZOCOR) 40 MG tablet Take 40 mg by mouth daily.     Yes Historical Provider, MD  Tiotropium Bromide Monohydrate (SPIRIVA RESPIMAT) 2.5 MCG/ACT AERS 2 puffs each am Patient taking differently: Take 1 puff by mouth daily.  11/23/14  Yes Nyoka Cowden, MD  zolpidem (AMBIEN) 5 MG tablet Take 5 mg by mouth daily as needed for sleep (sleep).  10/14/14  Yes Historical Provider, MD  albuterol (PROVENTIL) (5 MG/ML) 0.5% nebulizer solution Take 0.5 mLs (2.5 mg total) by nebulization every 4 (four) hours as needed for wheezing. 09/14/11 11/23/14  Rhetta Mura, MD  albuterol (PROVENTIL,VENTOLIN) 90 MCG/ACT inhaler Inhale 2 puffs into the lungs every 4 (four) hours as needed.      Historical Provider, MD   BP 131/77 mmHg  Pulse 79  Temp(Src) 98 F (36.7 C) (Oral)  Resp 14  SpO2 97% Physical Exam  Constitutional: He appears well-developed. No distress.  Awake, alert,  nontoxic appearance Cachectic appearing  HENT:  Head: Normocephalic and atraumatic.  Mouth/Throat: Oropharynx is clear and moist. No oropharyngeal exudate.  Eyes: Conjunctivae are normal. No scleral icterus.  Neck: Normal range of motion. Neck supple.  Cardiovascular: Normal rate, regular rhythm, normal heart sounds and intact distal pulses.   Pulmonary/Chest: Effort normal and breath sounds normal. No respiratory distress. He has no wheezes.  Equal chest expansion  Abdominal: Soft. Bowel sounds are normal. He exhibits no distension and no mass. There is no tenderness. There is no rebound and no guarding.  Soft and nontender  Musculoskeletal: Normal range of motion. He exhibits no edema.  Neurological: He is alert.  Speech is clear and goal oriented Moves extremities without ataxia  Skin: Skin is warm and dry. He is not diaphoretic. No erythema.  Psychiatric: He has a normal mood and  affect.  Nursing note and vitals reviewed.   ED Course  Procedures (including critical care time) Labs Review Labs Reviewed  CBC WITH DIFFERENTIAL/PLATELET - Abnormal; Notable for the following:    WBC 16.0 (*)    Neutrophils Relative % 84 (*)    Neutro Abs 13.4 (*)    Lymphocytes Relative 9 (*)    Monocytes Absolute 1.1 (*)    All other components within normal limits  COMPREHENSIVE METABOLIC PANEL - Abnormal; Notable for the following:    CO2 21 (*)    Glucose, Bld 134 (*)    BUN 74 (*)    Creatinine, Ser 2.43 (*)    ALT 15 (*)    GFR calc non Af Amer 23 (*)    GFR calc Af Amer 27 (*)    All other components within normal limits  URINALYSIS, ROUTINE W REFLEX MICROSCOPIC (NOT AT [facility]) - Abnormal; Notable for the following:    APPearance CLOUDY (*)    Glucose, UA 250 (*)    Hgb urine dipstick SMALL (*)    Protein, ur 30 (*)    Leukocytes, UA LARGE (*)    All other components within normal limits  URINE MICROSCOPIC-ADD ON - Abnormal; Notable for the following:    Squamous Epithelial / LPF  FEW (*)    Bacteria, UA MANY (*)    All other components within normal limits  CBG MONITORING, ED - Abnormal; Notable for the following:    Glucose-Capillary 136 (*)    All other components within normal limits  URINE CULTURE  CLOSTRIDIUM DIFFICILE BY PCR (NOT AT [facility])  CULTURE, BLOOD (ROUTINE X 2)  CULTURE, BLOOD (ROUTINE X 2)  LIPASE, BLOOD  HEMOGLOBIN A1C  LIPID PANEL  CREATININE, URINE, RANDOM  SODIUM, URINE, RANDOM  BASIC METABOLIC PANEL  CBC  I-STAT TROPOININ, ED  I-STAT CG4 LACTIC ACID, ED  I-STAT CG4 LACTIC ACID, ED    Imaging Review Ct Abdomen Pelvis Wo Contrast  01/04/2015   CLINICAL DATA:  81 year old male with nausea vomiting weakness abdominal pain and weight loss. Current history of hepatitis C and chronic kidney disease. Initial encounter.  EXAM: CT ABDOMEN AND PELVIS WITHOUT CONTRAST  TECHNIQUE: Multidetector CT imaging of the abdomen and pelvis was performed following the standard protocol without IV contrast.  COMPARISON:  Renal ultrasound 07/01/2010.  Chest CT 05/31/2003  FINDINGS: Mild respiratory motion artifact at the lung bases. No pericardial or pleural effusion. No confluent lung base opacity.  Degenerative changes throughout the spine. No acute osseous abnormality identified.  Small right inguinal hernia, predominantly fat containing. This does not appear to contain bowel. Evidence of previous left inguinal hernia repair. Up to 4 cm right posterior bladder urinary bladder diverticulum. Small are 2 cm left posterior bladder diverticulum versus distal hydroureter. Largely decompressed bladder, with moderate bladder wall thickening. No perivesical stranding.  Gas in the rectum. Decompressed sigmoid colon. Redundant sigmoid. No distal colon inflammation. Negative left colon and splenic flexure aside from retained stool. Negative transverse colon and right colon aside from retained stool. Negative terminal ileum. Appendix not identified. No pericecal inflammation. No  dilated small bowel. Oral contrast administered but has not yet reached the distal small bowel. Decompressed stomach and duodenum.  Noncontrast liver, gallbladder, spleen, and adrenal glands are within normal limits. Pancreatic atrophy. No abdominal free fluid.  Extensive Aortoiliac calcified atherosclerosis noted.  Abnormal kidneys and renal collecting systems. Bilateral renal cortical atrophy with lobulation. Dilated right renal pelvis with mild urothelial thickening (series 2, image  39). No adjacent inflammatory stranding. Rapid tapering of the right ureteral pelvic junction, although there is right hydroureter throughout much of its course, with mild urothelial thickening but no periureteral stranding. Smaller left renal pelvis dilatation. Left hydroureter and urothelial thickening similar to that on the right. No urologic calculus identified.  No lymphadenopathy identified.  No free air.  IMPRESSION: 1. No definite acute process identified in the abdomen or pelvis. 2. Chronic renal cortical atrophy and chronic/recurrent urinary tract inflammation suspected, with hydroureter an generalized urothelial thickening without associated inflammatory stranding. Associated urinary bladder wall thickening, bladder diverticula versus distal hydroureter. No urologic calculus identified. 3. Pancreatic atrophy.   Electronically Signed   By: Odessa Fleming M.D.   On: 01/04/2015 19:47   Dg Chest 2 View  01/04/2015   CLINICAL DATA:  Weakness for 1 week, shortness of breath, cough on and off for 2 weeks.  EXAM: CHEST  2 VIEW  COMPARISON:  11/29/2014  FINDINGS: The heart size and mediastinal contours are within normal limits. Both lungs are clear. The visualized skeletal structures are unremarkable.  IMPRESSION: No active cardiopulmonary disease.   Electronically Signed   By: Elige Ko   On: 01/04/2015 17:16     EKG Interpretation None      MDM   Final diagnoses:  Weakness  Weight loss  Nausea and vomiting  Cough    Darral Khosla presents with persistent N/V/D for 3 days after 2 weeks of intermittent symptoms.  Pt self caths due to a urethral stricture and has had intermittent UTIs.  He reports high CBGs in the mornings, but is without signs of DKA today.    8:53 PM Pt with UTI.  Will PO trial, but he is so weak that he is unable to walk here in the ED.  CT without acute abnormality.  No masses.  CBG WNL at this time.  Pt will need admission.    9:19 PM Discussed with Dr. Clyde Lundborg who will admit to tele.    BP 131/77 mmHg  Pulse 79  Temp(Src) 98 F (36.7 C) (Oral)  Resp 14  SpO2 97%   Dierdre Forth, PA-C 01/05/15 6712  Purvis Sheffield, MD 01/05/15 1042

## 2015-01-04 NOTE — ED Notes (Signed)
Admitting MD at bedside.

## 2015-01-04 NOTE — Progress Notes (Addendum)
EDCM spoke to patient and his wife at bedside.  Patient confirms he lives at home with his wife.  Patient's wife reports patient has come to the Ed for increased weakness, decreased appetite.  Patient has lost 21 pounds in the past 2 weeks per patient and his wife.  Patient reports he cannot tolerate food and can only tolerate "some liquids."  Patient has also been having diarrhea.  Patient's wife called patient's pcp today, but she thinks he is on vacation.  Patient's wife also reports patient's blood sugars have also been in the "500's" but "we were able to bring them down today, but that was a lot of shots."  Patient reports he does not have any difficulty obtaining or affording his medications.  Patient is usually able to perform his own ADL's without difficulty, but has not been able to lately per patient's wife due to weakness.  Patient's wife reports, "I can't pick him up."  [facility] provided patient's wife with list of home health agencies in [facility], explained services.  Patient and patient's wife thankful for services.  No further EDCM needs at this time.

## 2015-01-04 NOTE — ED Notes (Signed)
Bed: WA06 Expected date:  Expected time:  Means of arrival:  Comments: Ems-N/V

## 2015-01-05 DIAGNOSIS — I5042 Chronic combined systolic (congestive) and diastolic (congestive) heart failure: Secondary | ICD-10-CM

## 2015-01-05 DIAGNOSIS — E119 Type 2 diabetes mellitus without complications: Secondary | ICD-10-CM

## 2015-01-05 LAB — BASIC METABOLIC PANEL
ANION GAP: 6 (ref 5–15)
Anion gap: 7 (ref 5–15)
BUN: 66 mg/dL — AB (ref 6–20)
BUN: 59 mg/dL — ABNORMAL HIGH (ref 6–20)
CALCIUM: 8.2 mg/dL — AB (ref 8.9–10.3)
CHLORIDE: 104 mmol/L (ref 101–111)
CO2: 23 mmol/L (ref 22–32)
CO2: 24 mmol/L (ref 22–32)
Calcium: 8.2 mg/dL — ABNORMAL LOW (ref 8.9–10.3)
Chloride: 103 mmol/L (ref 101–111)
Creatinine, Ser: 2.13 mg/dL — ABNORMAL HIGH (ref 0.61–1.24)
Creatinine, Ser: 2.08 mg/dL — ABNORMAL HIGH (ref 0.61–1.24)
GFR calc Af Amer: 31 mL/min — ABNORMAL LOW (ref >60)
GFR calc Af Amer: 32 mL/min — ABNORMAL LOW (ref >60)
GFR calc non Af Amer: 27 mL/min — ABNORMAL LOW (ref >60)
GFR calc non Af Amer: 28 mL/min — ABNORMAL LOW (ref >60)
GLUCOSE: 366 mg/dL — AB (ref 65–99)
GLUCOSE: 480 mg/dL — AB (ref 65–99)
POTASSIUM: 4.5 mmol/L (ref 3.5–5.1)
POTASSIUM: 5.3 mmol/L — AB (ref 3.5–5.1)
SODIUM: 134 mmol/L — AB (ref 135–145)
SODIUM: 133 mmol/L — AB (ref 135–145)

## 2015-01-05 LAB — LIPID PANEL
CHOLESTEROL: 137 mg/dL (ref 0–200)
HDL: 48 mg/dL (ref >40)
LDL Cholesterol: 59 mg/dL (ref 0–99)
TRIGLYCERIDES: 148 mg/dL (ref <150)
Total CHOL/HDL Ratio: 2.9 RATIO
VLDL: 30 mg/dL (ref 0–40)

## 2015-01-05 LAB — GLUCOSE, CAPILLARY
GLUCOSE-CAPILLARY: 443 mg/dL — AB (ref 65–99)
Glucose-Capillary: 258 mg/dL — ABNORMAL HIGH (ref 65–99)
Glucose-Capillary: 34 mg/dL — CL (ref 65–99)
Glucose-Capillary: 42 mg/dL — CL (ref 65–99)
Glucose-Capillary: 71 mg/dL (ref 65–99)
Glucose-Capillary: 276 mg/dL — ABNORMAL HIGH (ref 65–99)
Glucose-Capillary: 320 mg/dL — ABNORMAL HIGH (ref 65–99)

## 2015-01-05 LAB — CBC
HEMATOCRIT: 39.1 % (ref 39.0–52.0)
HEMOGLOBIN: 13 g/dL (ref 13.0–17.0)
MCH: 30.2 pg (ref 26.0–34.0)
MCHC: 33.2 g/dL (ref 30.0–36.0)
MCV: 90.9 fL (ref 78.0–100.0)
Platelets: 358 10*3/uL (ref 150–400)
RBC: 4.3 MIL/uL (ref 4.22–5.81)
RDW: 13.5 % (ref 11.5–15.5)
WBC: 12.6 10*3/uL — ABNORMAL HIGH (ref 4.0–10.5)

## 2015-01-05 LAB — CREATININE, URINE, RANDOM: CREATININE, URINE: 28.57 mg/dL

## 2015-01-05 LAB — SODIUM, URINE, RANDOM: SODIUM UR: 52 mmol/L

## 2015-01-05 MED ORDER — ATORVASTATIN CALCIUM 20 MG PO TABS
20.0000 mg | ORAL_TABLET | Freq: Every day | ORAL | Status: DC
  Administered 2015-01-05 – 2015-01-06 (×2): 20 mg via ORAL
  Filled 2015-01-05 (×3): qty 1

## 2015-01-05 MED ORDER — INSULIN ASPART 100 UNIT/ML ~~LOC~~ SOLN
0.0000 [IU] | Freq: Three times a day (TID) | SUBCUTANEOUS | Status: DC
  Administered 2015-01-05: 8 [IU] via SUBCUTANEOUS
  Administered 2015-01-06: 5 [IU] via SUBCUTANEOUS
  Administered 2015-01-06: 3 [IU] via SUBCUTANEOUS

## 2015-01-05 MED ORDER — INSULIN ASPART 100 UNIT/ML ~~LOC~~ SOLN
6.0000 [IU] | Freq: Once | SUBCUTANEOUS | Status: AC
  Administered 2015-01-05: 6 [IU] via SUBCUTANEOUS

## 2015-01-05 MED ORDER — INSULIN ASPART 100 UNIT/ML ~~LOC~~ SOLN
10.0000 [IU] | Freq: Once | SUBCUTANEOUS | Status: AC
  Administered 2015-01-05: 10 [IU] via SUBCUTANEOUS

## 2015-01-05 MED ORDER — SODIUM POLYSTYRENE SULFONATE 15 GM/60ML PO SUSP
15.0000 g | Freq: Once | ORAL | Status: AC
  Administered 2015-01-05: 15 g via ORAL
  Filled 2015-01-05: qty 60

## 2015-01-05 MED ORDER — INSULIN ASPART 100 UNIT/ML ~~LOC~~ SOLN
5.0000 [IU] | Freq: Once | SUBCUTANEOUS | Status: AC
  Administered 2015-01-05: 5 [IU] via SUBCUTANEOUS

## 2015-01-05 MED ORDER — ONDANSETRON HCL 4 MG/2ML IJ SOLN: 4.0000 mg | Freq: Four times a day (QID) | INTRAMUSCULAR | Status: DC | PRN

## 2015-01-05 MED ORDER — INSULIN GLARGINE 100 UNIT/ML ~~LOC~~ SOLN
14.0000 [IU] | Freq: Every day | SUBCUTANEOUS | Status: DC
  Administered 2015-01-05 – 2015-01-06 (×2): 14 [IU] via SUBCUTANEOUS
  Filled 2015-01-05 (×2): qty 0.14

## 2015-01-05 MED ORDER — DEXTROSE 50 % IV SOLN
INTRAVENOUS | Status: AC
  Administered 2015-01-05: 25 mL
  Filled 2015-01-05: qty 50

## 2015-01-05 NOTE — Progress Notes (Signed)
Hypoglycemic Event  CBG: 34  Treatment: 15 GM carbohydrate snack  Symptoms: Nervous/irritable  Follow-up CBG: Time:1642 CBG Result:34  Possible Reasons for Event:   Comments/MD notified:Dr. Lavonia Drafts  Remember to initiate Hypoglycemia Order Set & complete

## 2015-01-05 NOTE — Evaluation (Signed)
Physical Therapy Evaluation Patient Details Name: Benjamin Riddle MRN: [id number] DOB: Apr 22, 1928 Today's Date: 01/05/2015   History of Present Illness  81 y.o. male with PMH of IDDM, asthma, COPD, CKD-III, DM, GERD, HTN, neurogenic bladder (doing self cath), osteoporosis, recurrent UTI, Hep C, combined systolic and diastolic congestive heart failure, who was admitted with nausea, diarrhea and vomiting.   Clinical Impression  Pt admitted with above diagnosis. Pt currently with functional limitations due to the deficits listed below (see PT Problem List). Pt ambulated 220' holding onto IV pole for balance. Will plan trial of RW next visit. Encouraged pt to walk in halls with nursing/family to prevent deconditioning. * Pt will benefit from skilled PT to increase their independence and safety with mobility to allow discharge to the venue listed below.       Follow Up Recommendations No PT follow up    Equipment Recommendations  Rolling walker with 5" wheels    Recommendations for Other Services       Precautions / Restrictions Precautions Precautions: Fall Precaution Comments: pt denies falls in past year Restrictions Weight Bearing Restrictions: No      Mobility  Bed Mobility Overal bed mobility: Modified Independent             General bed mobility comments: used rail, HOB up 35*  Transfers Overall transfer level: Needs assistance Equipment used: None Transfers: Sit to/from Stand Sit to Stand: Min guard         General transfer comment: min/guard for safety/balance  Ambulation/Gait Ambulation/Gait assistance: Min guard Ambulation Distance (Feet): 220 Feet Assistive device:  (one hand on IV pole, reached for handrail several times with other hand) Gait Pattern/deviations: Step-through pattern;Decreased stride length   Gait velocity interpretation: at or above normal speed for age/gender    Stairs            Wheelchair Mobility    Modified Rankin  (Stroke Patients Only)       Balance Overall balance assessment: Modified Independent                                           Pertinent Vitals/Pain Pain Assessment: No/denies pain    Home Living Family/patient expects to be discharged to:: Private residence Living Arrangements: Spouse/significant other Available Help at Discharge: Available PRN/intermittently   Home Access: Stairs to enter   Entrance Stairs-Number of Steps: 1   Home Equipment: Cane - quad;Cane - single point;Wheelchair - manual      Prior Function Level of Independence: Independent with assistive device(s)         Comments: used cane at times     Hand Dominance        Extremity/Trunk Assessment   Upper Extremity Assessment: Overall WFL for tasks assessed           Lower Extremity Assessment: Overall WFL for tasks assessed      Cervical / Trunk Assessment: Normal  Communication   Communication: No difficulties  Cognition Arousal/Alertness: Awake/alert Behavior During Therapy: WFL for tasks assessed/performed Overall Cognitive Status: Within Functional Limits for tasks assessed                      General Comments      Exercises        Assessment/Plan    PT Assessment Patient needs continued PT services  PT Diagnosis Generalized weakness  PT Problem List Decreased mobility;Decreased knowledge of use of DME  PT Treatment Interventions DME instruction;Gait training;Balance training;Therapeutic exercise   PT Goals (Current goals can be found in the Care Plan section) Acute Rehab PT Goals Patient Stated Goal: likes to play tennis PT Goal Formulation: With patient Time For Goal Achievement: 01/19/15 Potential to Achieve Goals: Good    Frequency Min 3X/week   Barriers to discharge        Co-evaluation               End of Session Equipment Utilized During Treatment: Gait belt Activity Tolerance: Patient tolerated treatment  well Patient left: in chair;with call bell/phone within reach Nurse Communication: Mobility status         Time: 4098-1191 PT Time Calculation (min) (ACUTE ONLY): 21 min   Charges:   PT Evaluation $Initial PT Evaluation Tier I: 1 Procedure     PT G Codes:        Tamala Ser 01/05/2015, 10:17 AM [phone]

## 2015-01-05 NOTE — Progress Notes (Addendum)
Patient CBG over 400, patient asymptomatic, Dr. Arthor Captain notified,  Ordered one time does of 10units novolog and patient on sliding scale, patient/wife stated that he needs more that he suppose to take 16 not 10units, Dr. Sherron Monday to patient, ordered additional 6units. will continue to assess patient.

## 2015-01-05 NOTE — Progress Notes (Addendum)
[facility] PROGRESS NOTE   Benjamin Riddle ZOX:[id number] DOB: September 28, 1927 DOA: 01/04/2015 PCP: Ezequiel Kayser, MD  HPI/Subjective: Feels much better, denies any nausea vomiting. No fever or chills.  Assessment/Plan: Principal Problem:   Nausea vomiting and diarrhea Active Problems:   Diabetes mellitus without complication   HLD (hyperlipidemia)   Essential hypertension   Asthma   Neurogenic bladder   COPD GOLD III    UTI (lower urinary tract infection)   Acute renal failure superimposed on stage 3 chronic kidney disease   Weakness   Combined systolic and diastolic congestive heart failure    UTI Urinalysis consistent with UTI, patient started on Rocephin, continued. We'll adjust antibiotics according to the culture results. Blood cultures obtained as well.  Nausea, vomiting and diarrhea Patient symptoms resolved, unclear if it's secondary to UTI or transient gastroenteritis. No nausea or vomiting since yesterday, on clear liquids and will advance diet. Patient feels he is still not back to his baseline, advance diet as tolerated.  Acute renal failure on CKD stage III  Patient baseline is 1.8 from May 2016, patient presented with creatinine of 2.43. Likely secondary to dehydration on top of chronic kidney disease. Patient started on IV fluids, continue hydration, check BMP in a.m. Creatinine today is 2.1.  Diabetes mellitus type 2 Patient is on 14 units of Lantus at night and sliding scale starts at 8 units of NovoLog. Carbohydrate modified diet, sliding scale and restarted Lantus. Check hemoglobin A1c. Blood sugar is over 500 units, check BMP/bicarbonate to rule out metabolic acidosis.  Hypertension Stable continue home medications.  History of combined systolic and diastolic congestive heart failure Last 2-D echocardiogram in February 2013 showed ejection fraction of 45-50% and grade 1 diastolic dysfunction. No symptoms or signs of decompensation. Decrease  IV fluids rate and follow clinically.  Code Status: Full Code Family Communication: Plan discussed with the patient. Disposition Plan: Remains inpatient Diet: Diet full liquid Room service appropriate?: Yes; Fluid consistency:: Thin  Consultants:  None  Procedures:  None  Antibiotics:  Rocephin   Objective: Filed Vitals:   01/05/15 0553  BP: 155/82  Pulse: 78  Temp: 98 F (36.7 C)  Resp: 16    Intake/Output Summary (Last 24 hours) at 01/05/15 1000 Last data filed at 01/05/15 0700  Gross per 24 hour  Intake 870.83 ml  Output      0 ml  Net 870.83 ml   Filed Weights   01/04/15 2224  Weight: 54.931 kg (121 lb 1.6 oz)    Exam: General: Alert and awake, oriented x3, not in any acute distress. HEENT: anicteric sclera, pupils reactive to light and accommodation, EOMI CVS: S1-S2 clear, no murmur rubs or gallops Chest: clear to auscultation bilaterally, no wheezing, rales or rhonchi Abdomen: soft nontender, nondistended, normal bowel sounds, no organomegaly Extremities: no cyanosis, clubbing or edema noted bilaterally Neuro: Cranial nerves II-XII intact, no focal neurological deficits  Data Reviewed: Basic Metabolic Panel:  Recent Labs Lab 01/04/15 1551 01/05/15 0522  NA 135 134*  K 4.9 4.5  CL 101 104  CO2 21* 23  GLUCOSE 134* 366*  BUN 74* 66*  CREATININE 2.43* 2.13*  CALCIUM 9.2 8.2*   Liver Function Tests:  Recent Labs Lab 01/04/15 1551  AST 18  ALT 15*  ALKPHOS 48  BILITOT 0.8  PROT 6.6  ALBUMIN 3.9    Recent Labs Lab 01/04/15 1551  LIPASE 35   No results for input(s): AMMONIA in the last 168 hours. CBC:  Recent Labs Lab  01/04/15 1551 01/05/15 0522  WBC 16.0* 12.6*  NEUTROABS 13.4*  --   HGB 14.1 13.0  HCT 41.6 39.1  MCV 90.6 90.9  PLT 394 358   Cardiac Enzymes: No results for input(s): CKTOTAL, CKMB, CKMBINDEX, TROPONINI in the last 168 hours. BNP (last 3 results) No results for input(s): BNP in the last 8760  hours.  ProBNP (last 3 results) No results for input(s): PROBNP in the last 8760 hours.  CBG:  Recent Labs Lab 01/04/15 1836  GLUCAP 136*    Micro No results found for this or any previous visit (from the past 240 hour(s)).   Studies: Ct Abdomen Pelvis Wo Contrast  01/04/2015   CLINICAL DATA:  81 year old male with nausea vomiting weakness abdominal pain and weight loss. Current history of hepatitis C and chronic kidney disease. Initial encounter.  EXAM: CT ABDOMEN AND PELVIS WITHOUT CONTRAST  TECHNIQUE: Multidetector CT imaging of the abdomen and pelvis was performed following the standard protocol without IV contrast.  COMPARISON:  Renal ultrasound 07/01/2010.  Chest CT 05/31/2003  FINDINGS: Mild respiratory motion artifact at the lung bases. No pericardial or pleural effusion. No confluent lung base opacity.  Degenerative changes throughout the spine. No acute osseous abnormality identified.  Small right inguinal hernia, predominantly fat containing. This does not appear to contain bowel. Evidence of previous left inguinal hernia repair. Up to 4 cm right posterior bladder urinary bladder diverticulum. Small are 2 cm left posterior bladder diverticulum versus distal hydroureter. Largely decompressed bladder, with moderate bladder wall thickening. No perivesical stranding.  Gas in the rectum. Decompressed sigmoid colon. Redundant sigmoid. No distal colon inflammation. Negative left colon and splenic flexure aside from retained stool. Negative transverse colon and right colon aside from retained stool. Negative terminal ileum. Appendix not identified. No pericecal inflammation. No dilated small bowel. Oral contrast administered but has not yet reached the distal small bowel. Decompressed stomach and duodenum.  Noncontrast liver, gallbladder, spleen, and adrenal glands are within normal limits. Pancreatic atrophy. No abdominal free fluid.  Extensive Aortoiliac calcified atherosclerosis noted.   Abnormal kidneys and renal collecting systems. Bilateral renal cortical atrophy with lobulation. Dilated right renal pelvis with mild urothelial thickening (series 2, image 39). No adjacent inflammatory stranding. Rapid tapering of the right ureteral pelvic junction, although there is right hydroureter throughout much of its course, with mild urothelial thickening but no periureteral stranding. Smaller left renal pelvis dilatation. Left hydroureter and urothelial thickening similar to that on the right. No urologic calculus identified.  No lymphadenopathy identified.  No free air.  IMPRESSION: 1. No definite acute process identified in the abdomen or pelvis. 2. Chronic renal cortical atrophy and chronic/recurrent urinary tract inflammation suspected, with hydroureter an generalized urothelial thickening without associated inflammatory stranding. Associated urinary bladder wall thickening, bladder diverticula versus distal hydroureter. No urologic calculus identified. 3. Pancreatic atrophy.   Electronically Signed   By: Odessa Fleming M.D.   On: 01/04/2015 19:47   Dg Chest 2 View  01/04/2015   CLINICAL DATA:  Weakness for 1 week, shortness of breath, cough on and off for 2 weeks.  EXAM: CHEST  2 VIEW  COMPARISON:  11/29/2014  FINDINGS: The heart size and mediastinal contours are within normal limits. Both lungs are clear. The visualized skeletal structures are unremarkable.  IMPRESSION: No active cardiopulmonary disease.   Electronically Signed   By: Elige Ko   On: 01/04/2015 17:16    Scheduled Meds: . amLODipine  5 mg Oral Daily  . cefTRIAXone (ROCEPHIN) IVPB  1 gram/50 mL D5W  1 g Intravenous Q24H  . cholecalciferol  1,000 Units Oral Daily  . dextromethorphan-guaiFENesin  1 tablet Oral BID  . famotidine  20 mg Oral BID  . heparin  5,000 Units Subcutaneous 3 times per day  . insulin glargine  3 Units Subcutaneous QHS  . ipratropium-albuterol  3 mL Nebulization BID  . simvastatin  40 mg Oral q1800  .  sodium chloride  3 mL Intravenous Q12H   Continuous Infusions: . sodium chloride 125 mL/hr at 01/05/15 0744       Time spent: 35 minutes    [facility]  [facility] Pager [phone] If 7PM-7AM, please contact night-coverage at [email or web address], password [facility] 01/05/2015, 10:00 AM  LOS: 1 day

## 2015-01-05 NOTE — Progress Notes (Signed)
Pt with labile cbgs today from 442 to as low as 34.  Pt took his own cbg on his meter at 1930 and got 376 we just took it on ours and it's currently 276.  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  I have called Elray Mcgregor who is on call and she has just spoke to pt and is putting in orders.  Will continue to monitor

## 2015-01-06 DIAGNOSIS — E1165 Type 2 diabetes mellitus with hyperglycemia: Secondary | ICD-10-CM

## 2015-01-06 DIAGNOSIS — N183 Chronic kidney disease, stage 3 (moderate): Secondary | ICD-10-CM

## 2015-01-06 DIAGNOSIS — N179 Acute kidney failure, unspecified: Secondary | ICD-10-CM

## 2015-01-06 DIAGNOSIS — N390 Urinary tract infection, site not specified: Principal | ICD-10-CM

## 2015-01-06 DIAGNOSIS — R112 Nausea with vomiting, unspecified: Secondary | ICD-10-CM

## 2015-01-06 DIAGNOSIS — R197 Diarrhea, unspecified: Secondary | ICD-10-CM

## 2015-01-06 DIAGNOSIS — E1129 Type 2 diabetes mellitus with other diabetic kidney complication: Secondary | ICD-10-CM

## 2015-01-06 LAB — CBC
HCT: 36.4 % — ABNORMAL LOW (ref 39.0–52.0)
Hemoglobin: 12.1 g/dL — ABNORMAL LOW (ref 13.0–17.0)
MCH: 30.3 pg (ref 26.0–34.0)
MCHC: 33.2 g/dL (ref 30.0–36.0)
MCV: 91 fL (ref 78.0–100.0)
Platelets: 314 10*3/uL (ref 150–400)
RBC: 4 MIL/uL — ABNORMAL LOW (ref 4.22–5.81)
RDW: 13.6 % (ref 11.5–15.5)
WBC: 7.9 10*3/uL (ref 4.0–10.5)

## 2015-01-06 LAB — GLUCOSE, CAPILLARY
Glucose-Capillary: 165 mg/dL — ABNORMAL HIGH (ref 65–99)
Glucose-Capillary: 210 mg/dL — ABNORMAL HIGH (ref 65–99)
Glucose-Capillary: 160 mg/dL — ABNORMAL HIGH (ref 65–99)
Glucose-Capillary: 238 mg/dL — ABNORMAL HIGH (ref 65–99)

## 2015-01-06 LAB — BASIC METABOLIC PANEL
Anion gap: 7 (ref 5–15)
BUN: 45 mg/dL — ABNORMAL HIGH (ref 6–20)
CHLORIDE: 105 mmol/L (ref 101–111)
CO2: 24 mmol/L (ref 22–32)
Calcium: 7.8 mg/dL — ABNORMAL LOW (ref 8.9–10.3)
Creatinine, Ser: 1.74 mg/dL — ABNORMAL HIGH (ref 0.61–1.24)
GFR calc Af Amer: 40 mL/min — ABNORMAL LOW (ref >60)
GFR calc non Af Amer: 34 mL/min — ABNORMAL LOW (ref >60)
Glucose, Bld: 225 mg/dL — ABNORMAL HIGH (ref 65–99)
Potassium: 4.2 mmol/L (ref 3.5–5.1)
Sodium: 136 mmol/L (ref 135–145)

## 2015-01-06 MED ORDER — INSULIN ASPART 100 UNIT/ML ~~LOC~~ SOLN
0.0000 [IU] | Freq: Three times a day (TID) | SUBCUTANEOUS | Status: DC
  Administered 2015-01-06: 3 [IU] via SUBCUTANEOUS

## 2015-01-06 MED ORDER — FAMOTIDINE 10 MG PO TABS
10.0000 mg | ORAL_TABLET | Freq: Two times a day (BID) | ORAL | Status: DC
  Administered 2015-01-06 – 2015-01-07 (×2): 10 mg via ORAL
  Filled 2015-01-06 (×3): qty 1

## 2015-01-06 MED ORDER — INSULIN ASPART 100 UNIT/ML ~~LOC~~ SOLN
0.0000 [IU] | Freq: Every day | SUBCUTANEOUS | Status: DC
  Administered 2015-01-06: 2 [IU] via SUBCUTANEOUS

## 2015-01-06 MED ORDER — PRO-STAT 64 PO LIQD
30.0000 mL | Freq: Three times a day (TID) | ORAL | Status: DC
  Administered 2015-01-06 – 2015-01-07 (×2): 30 mL via ORAL
  Filled 2015-01-06 (×4): qty 30

## 2015-01-06 MED ORDER — SENNA 8.6 MG PO TABS
2.0000 | ORAL_TABLET | Freq: Every day | ORAL | Status: DC
  Administered 2015-01-06: 17.2 mg via ORAL
  Filled 2015-01-06: qty 2

## 2015-01-06 NOTE — Progress Notes (Addendum)
At 2100 pt voided 100cc's.  Then cath himself and got 1600cc return of clear yellow urine.  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  Noted that pt had a bottle of his home insulin in his bedside table on ice.  I  Have reinforced education of NOT self medication using  home medications while at hospital, both pt and wife verbalize understanding and I have asked wife to take it home with her tonight as per policy. Noted insulin is no longer on bedside table after wife has left.  Recheck of CBG at 2300 (after 5 units novolog at 2015) is 320.  Benjamin Riddle orders to give Lantas as order and no more novolog tonight.  Also I suggested we do a CBG at 0300 due to labile BS today and pt refuses.  I will continue to monitor pt closely

## 2015-01-06 NOTE — Progress Notes (Signed)
OT Cancellation Note  Patient Details Name: Benjamin Riddle MRN: [id number] DOB: 1928-03-01   Cancelled Treatment:    Reason Eval/Treat Not Completed: Other (comment).  Pt just received lunch.  Will check back tomorrow.  Evania Lyne 01/06/2015, 11:39 AM  Marica Otter, OTR/L [phone] 01/06/2015

## 2015-01-06 NOTE — Progress Notes (Signed)
Pt requests a cbg now and it is 165.  Per his order the ssi we would give AC breakfast would be 3 units.  Pt requesting 9 units novolog and states that what he would usually take.  Benjamin Riddle on call and notified and acknowledges the request and she will talk with MD in 25 mins during report b/c of pt's low BS yesterday and feels it is potentially unsafe to give that much at this time.  She will have MD come and discuss it with him this AM

## 2015-01-06 NOTE — Progress Notes (Addendum)
PROGRESS NOTE    Benjamin Riddle AVW:[id number] DOB: 08-Jan-1928 DOA: 01/04/2015 PCP: Ezequiel Kayser, MD  Primary Urologist: Dr. Barron Alvine Primary Endocrinologist: Dr. 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 In ED, patient was found to have positive urinalysis, lactate 1.27, troponin negative, lipase 35, WBC 16.0, no tachycardia, AoCKD-III. abdominal CT/pelvis showed no acute abnormalities, but with chronic renal cortical atrophy and chronic/recurrent urinary tract inflammation with hydroureter and generalized urothelial thickening without associated inflammatory stranding.   Assessment/Plan:  UTI complicating neurogenic bladder and home self-catheterization Urinalysis consistent with UTI, patient started on Rocephin, continued pending urine culture results. We'll adjust antibiotics according to the culture results. Blood cultures obtained as well. Patient states that he self catheterizes twice a day. Improving.  Nausea, vomiting and diarrhea Patient symptoms resolved, unclear if it's secondary to UTI or transient gastroenteritis. Tolerating diet without nausea or vomiting. No BM since admission. DC contact isolation.  Acute renal failure on CKD stage III  Patient baseline is 1.8 from May 2016, patient presented with creatinine of 2.43. Likely secondary to dehydration on top of chronic kidney disease. Creatinine improved after IV fluids and possibly at baseline.  Diabetes mellitus type 2 Patient on Tujeo, meal Novolog (8 units BF/4-5 lunch/8 units supper) and modified SSI at home Currently on 14 units of Lantus at night and NovoLog  SSI-CBGs ranging between 165-210 today. Will add bedtime NovoLog. No DKA Patient's appetite is still not at baseline. If CBGs continue to remain persistently high, will consider adding mealtime NovoLog  Hypertension Stable continue home medications.  History of chronic combined systolic and diastolic congestive heart failure Last 2-D echocardiogram in February 2013 showed ejection fraction of 45-50% and grade 1 diastolic dysfunction. No symptoms or signs of decompensation. Decrease IV fluids rate and follow clinically.    DVT prophylaxis: Subcutaneous heparin Code Status: Full Code Family Communication: Plan discussed with the patient. No family at bedside. Disposition Plan: DC home when medically stable.   Consultants:  None  Procedures:  None  Antibiotics:  Rocephin  Subjective: No BM since admission. Denies nausea or vomiting. Tolerating diet but appetite not yet at baseline.  Objective: Filed Vitals:   01/05/15 0553 01/05/15 1400 01/05/15 2135 01/06/15 0612  BP: 155/82 122/80 137/74 148/75  Pulse: 78 70 76 70  Temp: 98 F (36.7 C) 98 F (36.7 C) 97.8 F (36.6 C) 97.7 F (36.5 C)  TempSrc: Oral Oral Oral Oral  Resp: Height:      Weight:    61.7 kg (136 lb 0.4 oz)  SpO2: 95% 94% 95% 97%    Intake/Output Summary (Last 24 hours) at 01/06/15 1259 Last data filed at 01/06/15 1000  Gross per 24 hour  Intake 3506.67 ml  Output   3300 ml  Net 206.67 ml   Filed Weights   01/04/15 2224 01/06/15 0612  Weight: 54.931 kg (121 lb 1.6 oz) 61.7 kg (136 lb 0.4 oz)     Exam:  General exam: Pleasant elderly male lying comfortably supine in bed. Respiratory system: Clear. No increased work of breathing. Cardiovascular system: S1 & S2 heard, RRR. No JVD, murmurs, gallops, clicks or pedal edema. Telemetry: Sinus rhythm Gastrointestinal system: Abdomen  is nondistended, soft and nontender. Normal bowel sounds heard. Central nervous system: Alert and  oriented. No focal neurological deficits. Extremities: Symmetric 5 x 5 power.   Data Reviewed: Basic Metabolic Panel:  Recent Labs Lab 01/04/15 1551 01/05/15 0522 01/05/15 1044 01/06/15 0414  NA 135 134* 133* 136  K 4.9 4.5 5.3* 4.2  CL 101 104 103 105  CO2 21* 23 24 24   GLUCOSE 134* 366* 480* 225*  BUN 74* 66* 59* 45*  CREATININE 2.43* 2.13* 2.08* 1.74*  CALCIUM 9.2 8.2* 8.2* 7.8*   Liver Function Tests:  Recent Labs Lab 01/04/15 1551  AST 18  ALT 15*  ALKPHOS 48  BILITOT 0.8  PROT 6.6  ALBUMIN 3.9    Recent Labs Lab 01/04/15 1551  LIPASE 35   No results for input(s): AMMONIA in the last 168 hours. CBC:  Recent Labs Lab 01/04/15 1551 01/05/15 0522 01/06/15 0414  WBC 16.0* 12.6* 7.9  NEUTROABS 13.4*  --   --   HGB 14.1 13.0 12.1*  HCT 41.6 39.1 36.4*  MCV 90.6 90.9 91.0  PLT 394 358 314   Cardiac Enzymes: No results for input(s): CKTOTAL, CKMB, CKMBINDEX, TROPONINI in the last 168 hours. BNP (last 3 results) No results for input(s): PROBNP in the last 8760 hours. CBG:  Recent Labs Lab 01/05/15 1745 01/05/15 1938 01/05/15 2204 01/06/15 0611 01/06/15 1151  GLUCAP 71 276* 320* 165* 210*    Recent Results (from the past 240 hour(s))  Urine culture     Status: None (Preliminary result)   Collection Time: 01/04/15  3:49 PM  Result Value Ref Range Status   Specimen Description URINE, RANDOM  Final   Special Requests NONE  Final   Culture   Final    CULTURE REINCUBATED FOR BETTER GROWTH Performed at [facility]    Report Status PENDING  Incomplete           Studies: Ct Abdomen Pelvis Wo Contrast  01/04/2015   CLINICAL DATA:  81 year old male with nausea vomiting weakness abdominal pain and weight loss. Current history of hepatitis C and chronic kidney disease. Initial encounter.  EXAM: CT ABDOMEN AND PELVIS WITHOUT CONTRAST  TECHNIQUE: Multidetector CT imaging of the abdomen and pelvis was performed following the standard  protocol without IV contrast.  COMPARISON:  Renal ultrasound 07/01/2010.  Chest CT 05/31/2003  FINDINGS: Mild respiratory motion artifact at the lung bases. No pericardial or pleural effusion. No confluent lung base opacity.  Degenerative changes throughout the spine. No acute osseous abnormality identified.  Small right inguinal hernia, predominantly fat containing. This does not appear to contain bowel. Evidence of previous left inguinal hernia repair. Up to 4 cm right posterior bladder urinary bladder diverticulum. Small are 2 cm left posterior bladder diverticulum versus distal hydroureter. Largely decompressed bladder, with moderate bladder wall thickening. No perivesical stranding.  Gas in the rectum. Decompressed sigmoid colon. Redundant sigmoid. No distal colon inflammation. Negative left colon and splenic flexure aside from retained stool. Negative transverse colon and right colon aside from retained stool. Negative terminal ileum. Appendix not identified. No pericecal inflammation. No dilated small bowel. Oral contrast administered but has not yet reached the distal small bowel. Decompressed stomach and duodenum.  Noncontrast liver, gallbladder, spleen, and adrenal glands are within normal limits. Pancreatic atrophy. No abdominal free fluid.  Extensive Aortoiliac calcified atherosclerosis noted.  Abnormal kidneys and renal collecting systems. Bilateral renal cortical atrophy with lobulation. Dilated right renal pelvis with mild urothelial thickening (series 2, image 39).  No adjacent inflammatory stranding. Rapid tapering of the right ureteral pelvic junction, although there is right hydroureter throughout much of its course, with mild urothelial thickening but no periureteral stranding. Smaller left renal pelvis dilatation. Left hydroureter and urothelial thickening similar to that on the right. No urologic calculus identified.  No lymphadenopathy identified.  No free air.  IMPRESSION: 1. No definite acute  process identified in the abdomen or pelvis. 2. Chronic renal cortical atrophy and chronic/recurrent urinary tract inflammation suspected, with hydroureter an generalized urothelial thickening without associated inflammatory stranding. Associated urinary bladder wall thickening, bladder diverticula versus distal hydroureter. No urologic calculus identified. 3. Pancreatic atrophy.   Electronically Signed   By: Odessa Fleming M.D.   On: 01/04/2015 19:47   Dg Chest 2 View  01/04/2015   CLINICAL DATA:  Weakness for 1 week, shortness of breath, cough on and off for 2 weeks.  EXAM: CHEST  2 VIEW  COMPARISON:  11/29/2014  FINDINGS: The heart size and mediastinal contours are within normal limits. Both lungs are clear. The visualized skeletal structures are unremarkable.  IMPRESSION: No active cardiopulmonary disease.   Electronically Signed   By: Elige Ko   On: 01/04/2015 17:16        Scheduled Meds: . amLODipine  5 mg Oral Daily  . atorvastatin  20 mg Oral q1800  . cefTRIAXone (ROCEPHIN) IVPB 1 gram/50 mL D5W  1 g Intravenous Q24H  . cholecalciferol  1,000 Units Oral Daily  . dextromethorphan-guaiFENesin  1 tablet Oral BID  . famotidine  20 mg Oral BID  . heparin  5,000 Units Subcutaneous 3 times per day  . insulin aspart  0-15 Units Subcutaneous TID WC  . insulin aspart  0-5 Units Subcutaneous QHS  . insulin glargine  14 Units Subcutaneous QHS  . ipratropium-albuterol  3 mL Nebulization BID  . sodium chloride  3 mL Intravenous Q12H   Continuous Infusions: . sodium chloride 75 mL/hr at 01/05/15 1730    Principal Problem:   Nausea vomiting and diarrhea Active Problems:   Diabetes mellitus without complication   HLD (hyperlipidemia)   Essential hypertension   Asthma   Neurogenic bladder   COPD GOLD III    UTI (lower urinary tract infection)   Acute renal failure superimposed on stage 3 chronic kidney disease   Weakness   Combined systolic and diastolic congestive heart  failure    Time spent: 25 minutes.    Marcellus Scott, MD, FACP, FHM. [facility] Pager [phone]  If 7PM-7AM, please contact night-coverage [email or web address] Password [email or web address] 01/06/2015, 12:59 PM    LOS: 2 days

## 2015-01-06 NOTE — Progress Notes (Signed)
Initial Nutrition Assessment  DOCUMENTATION CODES:  Non-severe (moderate) malnutrition in context of chronic illness, Underweight  INTERVENTION: - Will order Prostat TID, each supplement provides 100 kcal, 15 grams of protein  NUTRITION DIAGNOSIS:  Altered nutrition lab value related to chronic illness as evidenced by other (see comment) (CBGs: 34-320 mg/dL).  GOAL:  Patient will meet greater than or equal to 90% of their needs  MONITOR:  PO intake, Weight trends, Labs, I & O's  REASON FOR ASSESSMENT:  Malnutrition Screening Tool  ASSESSMENT: Per H&P, 81 y.o. male with PMH of IDDM, asthma, COPD, CKD-III, DM, GERD, HTN, neurogenic bladder (doing self cath), osteoporosis, recurrent UTI, Hep C, combined systolic and diastolic congestive heart failure, who nausea and vomiting.   Pt seen for MST. BMI indicates underweight status. Unable to talk with pt x2 attempted visits. Based on visualization of pt, has at least moderate muscle and fat wasting but may be more severe and may meet criteria for severe malnutrition rather than moderate malnutrition; will need to assess at follow-up.  Per notes, pt has been having nausea/vomiting/diarrhea x1-2 weeks PTA. During this time he was also having hyperglycemia, poor appetite, and weight loss (some of weight loss likely attributable to sustained hyperglycemia).  Per weight hx review, pt has lost 10 lbs (7% body weight) in the past month which is significant for time frame. He has been eating 50% of meals since admission. Will order Prostat TID to increase protein without adding addition carbohydrate which may further affect blood sugar.  Medications reviewed. Labs reviewed; CBGs: 34-320 mg/dL, BUN/creatinine elevated, Ca: 7.8 mg/dL, GFR: 34.  Height:  Ht Readings from Last 1 Encounters:  01/04/15 5\' 9"  (1.753 m)    Weight:  Wt Readings from Last 1 Encounters:  01/06/15 136 lb 0.4 oz (61.7 kg)    Ideal Body Weight:  72.7 kg  (kg)  Wt Readings from Last 10 Encounters:  01/06/15 136 lb 0.4 oz (61.7 kg)  11/23/14 146 lb (66.225 kg)  09/26/14 143 lb (64.864 kg)  08/31/14 143 lb 6.4 oz (65.046 kg)  11/23/13 149 lb (67.586 kg)  09/14/11 141 lb 6.4 oz (64.139 kg)  09/01/11 144 lb 9.6 oz (65.59 kg)  12/11/09 155 lb 12.8 oz (70.67 kg)  08/31/07 157 lb (71.215 kg)    BMI:  Body mass index is 20.08 kg/(m^2).  Estimated Nutritional Needs:  Kcal:  1600-1800  Protein:  65-75 grams  Fluid:  2 L/day  Skin:  Reviewed, no issues  Diet Order:  Diet heart healthy/carb modified Room service appropriate?: Yes; Fluid consistency:: Thin  EDUCATION NEEDS:  No education needs identified at this time   Intake/Output Summary (Last 24 hours) at 01/06/15 1309 Last data filed at 01/06/15 1000  Gross per 24 hour  Intake 3506.67 ml  Output   3300 ml  Net 206.67 ml    Last BM:  6/15   Trenton Gammon, RD, LDN Inpatient Clinical Dietitian Pager # [phone] After hours/weekend pager # [phone]

## 2015-01-07 DIAGNOSIS — N3000 Acute cystitis without hematuria: Secondary | ICD-10-CM

## 2015-01-07 DIAGNOSIS — I10 Essential (primary) hypertension: Secondary | ICD-10-CM

## 2015-01-07 LAB — BASIC METABOLIC PANEL
ANION GAP: 7 (ref 5–15)
BUN: 37 mg/dL — ABNORMAL HIGH (ref 6–20)
CHLORIDE: 108 mmol/L (ref 101–111)
CO2: 26 mmol/L (ref 22–32)
Calcium: 8.6 mg/dL — ABNORMAL LOW (ref 8.9–10.3)
Creatinine, Ser: 1.6 mg/dL — ABNORMAL HIGH (ref 0.61–1.24)
GFR calc non Af Amer: 38 mL/min — ABNORMAL LOW (ref >60)
GFR, EST AFRICAN AMERICAN: 44 mL/min — AB (ref >60)
Glucose, Bld: 71 mg/dL (ref 65–99)
POTASSIUM: 4.2 mmol/L (ref 3.5–5.1)
SODIUM: 141 mmol/L (ref 135–145)

## 2015-01-07 LAB — URINE CULTURE: Culture: >=100000

## 2015-01-07 LAB — HEMOGLOBIN A1C
HEMOGLOBIN A1C: 9.2 % — AB (ref 4.8–5.6)
HEMOGLOBIN A1C: 9.1 % — AB (ref 4.8–5.6)
MEAN PLASMA GLUCOSE: 214 mg/dL
Mean Plasma Glucose: 217 mg/dL

## 2015-01-07 LAB — CBC
HCT: 41.8 % (ref 39.0–52.0)
HEMOGLOBIN: 13.7 g/dL (ref 13.0–17.0)
MCH: 30.2 pg (ref 26.0–34.0)
MCHC: 32.8 g/dL (ref 30.0–36.0)
MCV: 92.1 fL (ref 78.0–100.0)
PLATELETS: 319 10*3/uL (ref 150–400)
RBC: 4.54 MIL/uL (ref 4.22–5.81)
RDW: 13.7 % (ref 11.5–15.5)
WBC: 8.9 10*3/uL (ref 4.0–10.5)

## 2015-01-07 LAB — GLUCOSE, CAPILLARY
Glucose-Capillary: 48 mg/dL — ABNORMAL LOW (ref 65–99)
Glucose-Capillary: 103 mg/dL — ABNORMAL HIGH (ref 65–99)
Glucose-Capillary: 161 mg/dL — ABNORMAL HIGH (ref 65–99)
Glucose-Capillary: 247 mg/dL — ABNORMAL HIGH (ref 65–99)

## 2015-01-07 MED ORDER — INSULIN GLARGINE 100 UNIT/ML ~~LOC~~ SOLN
10.0000 [IU] | Freq: Every day | SUBCUTANEOUS | Status: DC
  Filled 2015-01-07: qty 0.1

## 2015-01-07 MED ORDER — SULFAMETHOXAZOLE-TRIMETHOPRIM 400-80 MG PO TABS
1.0000 | ORAL_TABLET | ORAL | Status: AC
  Administered 2015-01-07: 1 via ORAL
  Filled 2015-01-07: qty 1

## 2015-01-07 MED ORDER — UNABLE TO FIND: Status: DC

## 2015-01-07 MED ORDER — SULFAMETHOXAZOLE-TRIMETHOPRIM 400-80 MG PO TABS: 1.0000 | ORAL_TABLET | Freq: Two times a day (BID) | ORAL | Status: DC

## 2015-01-07 MED ORDER — INSULIN ASPART 100 UNIT/ML ~~LOC~~ SOLN
0.0000 [IU] | Freq: Three times a day (TID) | SUBCUTANEOUS | Status: DC
  Administered 2015-01-07: 3 [IU] via SUBCUTANEOUS
  Administered 2015-01-07: 2 [IU] via SUBCUTANEOUS

## 2015-01-07 MED ORDER — PRO-STAT 64 PO LIQD: 30.0000 mL | Freq: Three times a day (TID) | ORAL | Status: DC

## 2015-01-07 NOTE — Discharge Instructions (Signed)

## 2015-01-07 NOTE — Care Management Note (Signed)
Case Management Note  Patient Details  Name: Benjamin Riddle MRN: [id number] Date of Birth: [birth year]  Subjective/Objective:                    Action/Plan:d/c home w/[facility] walker-[facility] dme rep Mayra Reel aware of concerns about size of bathroom to fit rw.   Expected Discharge Date:   (unknown)               Expected Discharge Plan:  Home/Self Care  In-House Referral:     Discharge planning Services     Post Acute Care Choice:    Choice offered to:  Patient  DME Arranged:  Walker rolling DME Agency:  [facility]  HH Arranged:    HH Agency:     Status of Service:  Completed, signed off  Medicare Important Message Given:  Yes Date Medicare IM Given:  01/07/15 Medicare IM give by:  Lanier Clam Date Additional Medicare IM Given:    Additional Medicare Important Message give by:     If discussed at Long Length of Stay Meetings, dates discussed:    Additional Comments:  Lanier Clam, RN 01/07/2015, 11:35 AM

## 2015-01-07 NOTE — Discharge Summary (Signed)
Physician Discharge Summary  Benjamin Riddle EAV:[id number] DOB: March 26, 1928 DOA: 01/04/2015  PCP: Ezequiel Kayser, MD  Admit date: 01/04/2015 Discharge date: 01/07/2015  Time spent: Greater than 30 minutes  Recommendations for Outpatient Follow-up:  1. Dr. Rodrigo Ran, PCP in 5 days with repeat labs (CBC & BMP). 2. Dr. Casimiro Needle [place], Endocrinology in 1 week. 3. Dr. Barron Alvine, Urology in 1 week. 4. DME rolling walker with 5 inch wheels.  Discharge Diagnoses:  Principal Problem:   Nausea vomiting and diarrhea Active Problems:   Diabetes mellitus without complication   HLD (hyperlipidemia)   Essential hypertension   Asthma   Neurogenic bladder   COPD GOLD III    UTI (lower urinary tract infection)   Acute renal failure superimposed on stage 3 chronic kidney disease   Weakness   Combined systolic and diastolic congestive heart failure   Malnutrition of moderate degree   Discharge Condition: Improved & Stable  Diet recommendation: Heart healthy and diabetic diet.  Filed Weights   01/04/15 2224 01/06/15 0612 01/07/15 0344  Weight: 54.931 kg (121 lb 1.6 oz) 61.7 kg (136 lb 0.4 oz) 62.732 kg (138 lb 4.8 oz)    History of present illness:  81 y.o. male, retired Development worker, community, with PMH of IDDM, asthma, COPD, CKD-III, DM, GERD, HTN, neurogenic bladder (doing self cath), osteoporosis, recurrent UTI, Hep C, combined systolic and diastolic congestive heart failure, recent UTI treated with doxycycline, presented with 2 weeks history of worsening nausea, vomiting, diarrhea and 20 pound weight loss. In ED, patient was found to have positive urinalysis, lactate 1.27, troponin negative, lipase 35, WBC 16.0, no tachycardia, AoCKD-III. abdominal CT/pelvis showed no acute abnormalities, but with chronic renal cortical atrophy and chronic/recurrent urinary tract inflammation with hydroureter and generalized urothelial thickening without associated inflammatory stranding.  Hospital Course:    Coagulase negative Staphylococcus UTI (versus colonization) complicating neurogenic bladder and home self-catheterization Urinalysis consistent with UTI, patient started on Rocephin, continued pending urine culture results. Patient states that he self catheterizes twice a day. Patient did complain of new urinary frequency PTA but no dysuria, fever or chills. His urinary frequency has improved. Leukocytosis has resolved. No fever since admission. Patient completed 3 days of IV Rocephin. Discussed urine culture results with infectious disease M.D. on call. Recommended discharging on a very short course, 2-3 days of oral Bactrim adjusted to renal function. Discussed with patient's primary urologist who will see him during outpatient follow-up.  Nausea, vomiting and diarrhea Patient symptoms resolved, unclear if it's secondary to UTI or transient gastroenteritis. Symptoms resolved. No BM for 3 days. No abdominal pain reported.  Acute renal failure on CKD stage III  Patient baseline is 1.8 from May 2016, patient presented with creatinine of 2.43. Likely secondary to dehydration on top of chronic kidney disease. Creatinine probably back to baseline post IV hydration. ARB was temporarily held and will be resumed at discharge. Follow-up BMP closely at outpatient follow-up with PCP.  Diabetes mellitus type 2 Patient on Tujeo, meal Novolog (8 units BF/4-5 lunch/8 units supper) and modified SSI at home In the hospital, he was placed on Lantus 14 units at bedtime and moderate sensitivity SSI. Patient had fluctuating hyperglycemia and hypoglycemic episodes.  Patient and wife are well equipped regarding management of diabetes including hypoglycemic episodes. Patient will be discharged on prior home regimen with close outpatient follow-up with his primary endocrinologist. His difficulty controlling blood sugars in the hospital where likely multifactorial from poor intake, acute on chronic kidney  disease.  Hypertension Continue home  regimen  History of chronic combined systolic and diastolic congestive heart failure Last 2-D echocardiogram in February 2013 showed ejection fraction of 45-50% and grade 1 diastolic dysfunction. No symptoms or signs of decompensation.    Consultants:  None  Procedures:  None   Discharge Exam:  Complaints:  Denies complaints. History of urinary frequency on admission which has resolved. Had hypoglycemic episode early this morning which has resolved. Denies any other complaints.  Filed Vitals:   01/07/15 0344 01/07/15 0457 01/07/15 0558 01/07/15 0743  BP:  177/81 134/63   Pulse:  78 83   Temp:  97.8 F (36.6 C)    TempSrc:  Oral    Resp:  16    Height:      Weight: 62.732 kg (138 lb 4.8 oz)     SpO2:  100%  98%    General exam: Pleasant elderly male sitting up comfortably in bed. Respiratory system: Clear. No increased work of breathing. Cardiovascular system: S1 & S2 heard, RRR. No JVD, murmurs, gallops, clicks or pedal edema.  Gastrointestinal system: Abdomen is nondistended, soft and nontender. Normal bowel sounds heard. Central nervous system: Alert and oriented. No focal neurological deficits. Extremities: Symmetric 5 x 5 power.  Discharge Instructions      Discharge Instructions    Call MD for:  difficulty breathing, headache or visual disturbances    Complete by:  As directed      Call MD for:  extreme fatigue    Complete by:  As directed      Call MD for:  persistant dizziness or light-headedness    Complete by:  As directed      Call MD for:  persistant nausea and vomiting    Complete by:  As directed      Call MD for:  severe uncontrolled pain    Complete by:  As directed      Call MD for:  temperature >100.4    Complete by:  As directed      Diet - low sodium heart healthy    Complete by:  As directed      Diet Carb Modified    Complete by:  As directed      Increase activity slowly    Complete by:  As  directed             Medication List    TAKE these medications        albuterol (2.5 MG/3ML) 0.083% NEBU 3 mL, albuterol (5 MG/ML) 0.5% NEBU 0.5 mL  Inhale 5 mg into the lungs every 6 (six) hours as needed (wheezing).     albuterol 90 MCG/ACT inhaler  Commonly known as:  PROVENTIL,VENTOLIN  Inhale 2 puffs into the lungs every 4 (four) hours as needed.     cholecalciferol 1000 UNITS tablet  Commonly known as:  VITAMIN D  Take 1,000 Units by mouth daily.     famotidine 20 MG tablet  Commonly known as:  PEPCID  Take 1 tablet by mouth 2 (two) times daily. To help 'lung condition'     feeding supplement (PRO-STAT 64) Liqd  Take 30 mLs by mouth 3 (three) times daily with meals.     insulin lispro 100 UNIT/ML injection  Commonly known as:  HUMALOG  Inject 4-8 Units into the skin 3 (three) times daily as needed for high blood sugar (high blood sugar). Adjusted to blood glucose level. Patient adds an additional unit for every /dL     mometasone-formoterol 100-5 MCG/ACT  Aero  Commonly known as:  DULERA  Take 2 puffs first thing in am and then another 2 puffs about 12 hours later.     olmesartan 20 MG tablet  Commonly known as:  BENICAR  Take 10 mg by mouth daily.     simvastatin 40 MG tablet  Commonly known as:  ZOCOR  Take 40 mg by mouth daily.     sulfamethoxazole-trimethoprim 400-80 MG per tablet  Commonly known as:  BACTRIM,SEPTRA  Take 1 tablet by mouth every 12 (twelve) hours.     Tiotropium Bromide Monohydrate 2.5 MCG/ACT Aers  Commonly known as:  SPIRIVA RESPIMAT  2 puffs each am     TOUJEO SOLOSTAR 300 UNIT/ML Sopn  Generic drug:  Insulin Glargine  Inject 14 Units into the skin at bedtime.     UNABLE TO FIND  Narrow rolling walker with 5 inch wheels. Diagnosis: Generalized weakness and functional limitations.     zolpidem 5 MG tablet  Commonly known as:  AMBIEN  Take 5 mg by mouth daily as needed for sleep (sleep).       Follow-up Information     Follow up with [facility].   Why:  rolling walker   Contact information:   [address] [place] [address] [phone]       Follow up with Rodrigo Ran A, MD. Schedule an appointment as soon as possible for a visit in 5 days.   Specialty:  Internal Medicine   Why:  To be seen with repeat labs (CBC & BMP).   Contact information:   [address] [place] [address] [phone]       Follow up with Junious Silk, MD. Schedule an appointment as soon as possible for a visit in 1 week.   Specialty:  Endocrinology   Contact information:   [facility] [place] [address] [phone]       Follow up with [facility], MD. Schedule an appointment as soon as possible for a visit in 1 week.   Specialty:  Urology   Contact information:   [address] [place] [address] [phone]        The results of significant diagnostics from this hospitalization (including imaging, microbiology, ancillary and laboratory) are listed below for reference.    Significant Diagnostic Studies: Ct Abdomen Pelvis Wo Contrast  01/04/2015   CLINICAL DATA:  81 year old male with nausea vomiting weakness abdominal pain and weight loss. Current history of hepatitis C and chronic kidney disease. Initial encounter.  EXAM: CT ABDOMEN AND PELVIS WITHOUT CONTRAST  TECHNIQUE: Multidetector CT imaging of the abdomen and pelvis was performed following the standard protocol without IV contrast.  COMPARISON:  Renal ultrasound 07/01/2010.  Chest CT 05/31/2003  FINDINGS: Mild respiratory motion artifact at the lung bases. No pericardial or pleural effusion. No confluent lung base opacity.  Degenerative changes throughout the spine. No acute osseous abnormality identified.  Small right inguinal hernia, predominantly fat containing. This does not appear to contain bowel. Evidence of previous left inguinal hernia repair. Up to 4 cm right posterior bladder urinary  bladder diverticulum. Small are 2 cm left posterior bladder diverticulum versus distal hydroureter. Largely decompressed bladder, with moderate bladder wall thickening. No perivesical stranding.  Gas in the rectum. Decompressed sigmoid colon. Redundant sigmoid. No distal colon inflammation. Negative left colon and splenic flexure aside from retained stool. Negative transverse colon and right colon aside from retained stool. Negative terminal ileum. Appendix not identified. No  pericecal inflammation. No dilated small bowel. Oral contrast administered but has not yet reached the distal small bowel. Decompressed stomach and duodenum.  Noncontrast liver, gallbladder, spleen, and adrenal glands are within normal limits. Pancreatic atrophy. No abdominal free fluid.  Extensive Aortoiliac calcified atherosclerosis noted.  Abnormal kidneys and renal collecting systems. Bilateral renal cortical atrophy with lobulation. Dilated right renal pelvis with mild urothelial thickening (series 2, image 39). No adjacent inflammatory stranding. Rapid tapering of the right ureteral pelvic junction, although there is right hydroureter throughout much of its course, with mild urothelial thickening but no periureteral stranding. Smaller left renal pelvis dilatation. Left hydroureter and urothelial thickening similar to that on the right. No urologic calculus identified.  No lymphadenopathy identified.  No free air.  IMPRESSION: 1. No definite acute process identified in the abdomen or pelvis. 2. Chronic renal cortical atrophy and chronic/recurrent urinary tract inflammation suspected, with hydroureter an generalized urothelial thickening without associated inflammatory stranding. Associated urinary bladder wall thickening, bladder diverticula versus distal hydroureter. No urologic calculus identified. 3. Pancreatic atrophy.   Electronically Signed   By: Odessa Fleming M.D.   On: 01/04/2015 19:47   Dg Chest 2 View  01/04/2015   CLINICAL DATA:   Weakness for 1 week, shortness of breath, cough on and off for 2 weeks.  EXAM: CHEST  2 VIEW  COMPARISON:  11/29/2014  FINDINGS: The heart size and mediastinal contours are within normal limits. Both lungs are clear. The visualized skeletal structures are unremarkable.  IMPRESSION: No active cardiopulmonary disease.   Electronically Signed   By: Elige Ko   On: 01/04/2015 17:16    Microbiology: Recent Results (from the past 240 hour(s))  Urine culture     Status: None   Collection Time: 01/04/15  3:49 PM  Result Value Ref Range Status   Specimen Description URINE, RANDOM  Final   Special Requests NONE  Final   Culture   Final    >=100,000 COLONIES/mL COAGULASE NEGATIVE STAPHYLOCOCCUS Performed at [facility]    Report Status 01/07/2015 FINAL  Final   Organism ID, Bacteria COAGULASE NEGATIVE STAPHYLOCOCCUS  Final      Susceptibility   Coagulase negative Staphylococcus - MIC*    CIPROFLOXACIN >=8 RESISTANT Resistant     GENTAMICIN <=0.5 SENSITIVE Sensitive     NITROFURANTOIN <=16 SENSITIVE Sensitive     OXACILLIN >=4 RESISTANT Resistant     TETRACYCLINE >=16 RESISTANT Resistant     VANCOMYCIN 2 SENSITIVE Sensitive     TRIMETH/SULFA <=10 SENSITIVE Sensitive     CLINDAMYCIN <=0.25 SENSITIVE Sensitive     RIFAMPIN <=0.5 SENSITIVE Sensitive     Inducible Clindamycin NEGATIVE Sensitive     * >=100,000 COLONIES/mL COAGULASE NEGATIVE STAPHYLOCOCCUS     Labs: Basic Metabolic Panel:  Recent Labs Lab 01/04/15 1551 01/05/15 0522 01/05/15 1044 01/06/15 0414 01/07/15 0413  NA 135 134* 133* 136 141  K 4.9 4.5 5.3* 4.2 4.2  CL 101 104 103 105 108  CO2 21* 23 24 24 26   GLUCOSE 134* 366* 480* 225* 71  BUN 74* 66* 59* 45* 37*  CREATININE 2.43* 2.13* 2.08* 1.74* 1.60*  CALCIUM 9.2 8.2* 8.2* 7.8* 8.6*   Liver Function Tests:  Recent Labs Lab 01/04/15 1551  AST 18  ALT 15*  ALKPHOS 48  BILITOT 0.8  PROT 6.6  ALBUMIN 3.9    Recent Labs Lab 01/04/15 1551  LIPASE 35    No results for input(s): AMMONIA in the last 168 hours. CBC:  Recent  Labs Lab 01/04/15 1551 01/05/15 0522 01/06/15 0414 01/07/15 0413  WBC 16.0* 12.6* 7.9 8.9  NEUTROABS 13.4*  --   --   --   HGB 14.1 13.0 12.1* 13.7  HCT 41.6 39.1 36.4* 41.8  MCV 90.6 90.9 91.0 92.1  PLT 394 358 314 319   Cardiac Enzymes: No results for input(s): CKTOTAL, CKMB, CKMBINDEX, TROPONINI in the last 168 hours. BNP: BNP (last 3 results) No results for input(s): BNP in the last 8760 hours.  ProBNP (last 3 results) No results for input(s): PROBNP in the last 8760 hours.  CBG:  Recent Labs Lab 01/06/15 2123 01/07/15 0513 01/07/15 0556 01/07/15 0732 01/07/15 1130  GLUCAP 238* 48* 103* 161* 247*      Signed:  Marcellus Scott, MD, FACP, FHM. [facility] Pager [phone]  If 7PM-7AM, please contact night-coverage [email or web address] Password [email or web address] 01/07/2015, 1:46 PM

## 2015-01-07 NOTE — Progress Notes (Signed)
Hypoglycemic Event  CBG: 48  Treatment: 15 GM carbohydrate snack  Symptoms: None  Follow-up CBG: Time: 0555 CBG Result: 103  Possible Reasons for Event: Unknown  Comments/MD notified: Per protocol    Benjamin Riddle, Benjamin Riddle  Remember to initiate Hypoglycemia Order Set & complete

## 2015-01-07 NOTE — Progress Notes (Signed)
OT Cancellation Note  Patient Details Name: Benjamin Riddle MRN: [id number] DOB: May 25, 1928   Cancelled Treatment:    Reason Eval/Treat Not Completed: OT screened, no needs identified, will sign off. Pt and wife decline need for OT services at this time. Pt's wife states she assisted with pt shower this am and other ADL. She states she has helped him in the past and feels like she can assist him safely at home. They have a shower seat and higher commodes at home. Will sign off per pt and wife request.  Lennox Laity  [phone] 01/07/2015, 11:38 AM

## 2015-01-10 DIAGNOSIS — I10 Essential (primary) hypertension: Secondary | ICD-10-CM | POA: Diagnosis not present

## 2015-01-10 DIAGNOSIS — N183 Chronic kidney disease, stage 3 (moderate): Secondary | ICD-10-CM | POA: Diagnosis not present

## 2015-01-10 DIAGNOSIS — E46 Unspecified protein-calorie malnutrition: Secondary | ICD-10-CM | POA: Diagnosis not present

## 2015-01-16 DIAGNOSIS — N289 Disorder of kidney and ureter, unspecified: Secondary | ICD-10-CM | POA: Diagnosis not present

## 2015-01-16 DIAGNOSIS — N3020 Other chronic cystitis without hematuria: Secondary | ICD-10-CM | POA: Diagnosis not present

## 2015-01-16 DIAGNOSIS — N319 Neuromuscular dysfunction of bladder, unspecified: Secondary | ICD-10-CM | POA: Diagnosis not present

## 2015-01-16 DIAGNOSIS — R634 Abnormal weight loss: Secondary | ICD-10-CM | POA: Diagnosis not present

## 2015-01-23 ENCOUNTER — Inpatient Hospital Stay (HOSPITAL_COMMUNITY)
Admission: EM | Admit: 2015-01-23 | Discharge: 2015-01-26 | DRG: 637 | Disposition: A | Discharge status: Home / Self Care | Payer: Medicare Other | Attending: Internal Medicine | Admitting: Internal Medicine

## 2015-01-23 ENCOUNTER — Encounter (HOSPITAL_COMMUNITY): Payer: Self-pay

## 2015-01-23 DIAGNOSIS — Z87440 Personal history of urinary (tract) infections: Secondary | ICD-10-CM

## 2015-01-23 DIAGNOSIS — R112 Nausea with vomiting, unspecified: Secondary | ICD-10-CM | POA: Diagnosis present

## 2015-01-23 DIAGNOSIS — B1920 Unspecified viral hepatitis C without hepatic coma: Secondary | ICD-10-CM | POA: Diagnosis present

## 2015-01-23 DIAGNOSIS — E1310 Other specified diabetes mellitus with ketoacidosis without coma: Secondary | ICD-10-CM | POA: Diagnosis not present

## 2015-01-23 DIAGNOSIS — J45909 Unspecified asthma, uncomplicated: Secondary | ICD-10-CM | POA: Diagnosis present

## 2015-01-23 DIAGNOSIS — E785 Hyperlipidemia, unspecified: Secondary | ICD-10-CM | POA: Diagnosis present

## 2015-01-23 DIAGNOSIS — N184 Chronic kidney disease, stage 4 (severe): Secondary | ICD-10-CM | POA: Diagnosis present

## 2015-01-23 DIAGNOSIS — J449 Chronic obstructive pulmonary disease, unspecified: Secondary | ICD-10-CM | POA: Diagnosis present

## 2015-01-23 DIAGNOSIS — Z6820 Body mass index (BMI) 20.0-20.9, adult: Secondary | ICD-10-CM

## 2015-01-23 DIAGNOSIS — E860 Dehydration: Secondary | ICD-10-CM

## 2015-01-23 DIAGNOSIS — I129 Hypertensive chronic kidney disease with stage 1 through stage 4 chronic kidney disease, or unspecified chronic kidney disease: Secondary | ICD-10-CM | POA: Diagnosis present

## 2015-01-23 DIAGNOSIS — Z885 Allergy status to narcotic agent status: Secondary | ICD-10-CM | POA: Diagnosis not present

## 2015-01-23 DIAGNOSIS — N390 Urinary tract infection, site not specified: Secondary | ICD-10-CM | POA: Diagnosis not present

## 2015-01-23 DIAGNOSIS — I5042 Chronic combined systolic (congestive) and diastolic (congestive) heart failure: Secondary | ICD-10-CM | POA: Diagnosis present

## 2015-01-23 DIAGNOSIS — N319 Neuromuscular dysfunction of bladder, unspecified: Secondary | ICD-10-CM | POA: Diagnosis present

## 2015-01-23 DIAGNOSIS — Z881 Allergy status to other antibiotic agents status: Secondary | ICD-10-CM | POA: Diagnosis not present

## 2015-01-23 DIAGNOSIS — Z794 Long term (current) use of insulin: Secondary | ICD-10-CM | POA: Diagnosis not present

## 2015-01-23 DIAGNOSIS — Z87891 Personal history of nicotine dependence: Secondary | ICD-10-CM

## 2015-01-23 DIAGNOSIS — E1165 Type 2 diabetes mellitus with hyperglycemia: Secondary | ICD-10-CM | POA: Diagnosis present

## 2015-01-23 DIAGNOSIS — N179 Acute kidney failure, unspecified: Secondary | ICD-10-CM | POA: Diagnosis present

## 2015-01-23 DIAGNOSIS — E43 Unspecified severe protein-calorie malnutrition: Secondary | ICD-10-CM | POA: Diagnosis present

## 2015-01-23 DIAGNOSIS — I5040 Unspecified combined systolic (congestive) and diastolic (congestive) heart failure: Secondary | ICD-10-CM | POA: Diagnosis present

## 2015-01-23 DIAGNOSIS — I10 Essential (primary) hypertension: Secondary | ICD-10-CM | POA: Diagnosis not present

## 2015-01-23 DIAGNOSIS — E1110 Type 2 diabetes mellitus with ketoacidosis without coma: Secondary | ICD-10-CM | POA: Diagnosis present

## 2015-01-23 DIAGNOSIS — E875 Hyperkalemia: Secondary | ICD-10-CM | POA: Diagnosis present

## 2015-01-23 DIAGNOSIS — R531 Weakness: Secondary | ICD-10-CM | POA: Diagnosis not present

## 2015-01-23 DIAGNOSIS — E291 Testicular hypofunction: Secondary | ICD-10-CM | POA: Diagnosis present

## 2015-01-23 DIAGNOSIS — M810 Age-related osteoporosis without current pathological fracture: Secondary | ICD-10-CM | POA: Diagnosis present

## 2015-01-23 DIAGNOSIS — E1122 Type 2 diabetes mellitus with diabetic chronic kidney disease: Secondary | ICD-10-CM | POA: Diagnosis present

## 2015-01-23 DIAGNOSIS — E1010 Type 1 diabetes mellitus with ketoacidosis without coma: Secondary | ICD-10-CM

## 2015-01-23 DIAGNOSIS — R197 Diarrhea, unspecified: Secondary | ICD-10-CM

## 2015-01-23 DIAGNOSIS — R404 Transient alteration of awareness: Secondary | ICD-10-CM | POA: Diagnosis not present

## 2015-01-23 LAB — BASIC METABOLIC PANEL
ANION GAP: 11 (ref 5–15)
Anion gap: 19 — ABNORMAL HIGH (ref 5–15)
Anion gap: 7 (ref 5–15)
BUN: 54 mg/dL — ABNORMAL HIGH (ref 6–20)
BUN: 50 mg/dL — ABNORMAL HIGH (ref 6–20)
BUN: 44 mg/dL — AB (ref 6–20)
CHLORIDE: 105 mmol/L (ref 101–111)
CHLORIDE: 109 mmol/L (ref 101–111)
CO2: 13 mmol/L — ABNORMAL LOW (ref 22–32)
CO2: 20 mmol/L — AB (ref 22–32)
CO2: 20 mmol/L — AB (ref 22–32)
CREATININE: 2.07 mg/dL — AB (ref 0.61–1.24)
CREATININE: 1.94 mg/dL — AB (ref 0.61–1.24)
Calcium: 8.9 mg/dL (ref 8.9–10.3)
Calcium: 8.1 mg/dL — ABNORMAL LOW (ref 8.9–10.3)
Calcium: 7.9 mg/dL — ABNORMAL LOW (ref 8.9–10.3)
Chloride: 100 mmol/L — ABNORMAL LOW (ref 101–111)
Creatinine, Ser: 2.56 mg/dL — ABNORMAL HIGH (ref 0.61–1.24)
GFR calc Af Amer: 25 mL/min — ABNORMAL LOW (ref >60)
GFR calc Af Amer: 35 mL/min — ABNORMAL LOW (ref >60)
GFR calc non Af Amer: 21 mL/min — ABNORMAL LOW (ref >60)
GFR calc non Af Amer: 28 mL/min — ABNORMAL LOW (ref >60)
GFR calc non Af Amer: 30 mL/min — ABNORMAL LOW (ref >60)
GFR, EST AFRICAN AMERICAN: 32 mL/min — AB (ref >60)
Glucose, Bld: 720 mg/dL (ref 65–99)
Glucose, Bld: 474 mg/dL — ABNORMAL HIGH (ref 65–99)
Glucose, Bld: 222 mg/dL — ABNORMAL HIGH (ref 65–99)
POTASSIUM: 4.9 mmol/L (ref 3.5–5.1)
Potassium: 5.9 mmol/L — ABNORMAL HIGH (ref 3.5–5.1)
Potassium: 4.4 mmol/L (ref 3.5–5.1)
Sodium: 132 mmol/L — ABNORMAL LOW (ref 135–145)
Sodium: 136 mmol/L (ref 135–145)
Sodium: 136 mmol/L (ref 135–145)

## 2015-01-23 LAB — CBC WITH DIFFERENTIAL/PLATELET
Basophils Absolute: 0 10*3/uL (ref 0.0–0.1)
Basophils Relative: 0 % (ref 0–1)
Eosinophils Absolute: 0 10*3/uL (ref 0.0–0.7)
Eosinophils Relative: 0 % (ref 0–5)
HCT: 42.2 % (ref 39.0–52.0)
Hemoglobin: 13.4 g/dL (ref 13.0–17.0)
Lymphocytes Relative: 7 % — ABNORMAL LOW (ref 12–46)
Lymphs Abs: 1 10*3/uL (ref 0.7–4.0)
MCH: 30.5 pg (ref 26.0–34.0)
MCHC: 31.8 g/dL (ref 30.0–36.0)
MCV: 96.1 fL (ref 78.0–100.0)
Monocytes Absolute: 0.5 10*3/uL (ref 0.1–1.0)
Monocytes Relative: 3 % (ref 3–12)
Neutro Abs: 12.7 10*3/uL — ABNORMAL HIGH (ref 1.7–7.7)
Neutrophils Relative %: 90 % — ABNORMAL HIGH (ref 43–77)
Platelets: 370 10*3/uL (ref 150–400)
RBC: 4.39 MIL/uL (ref 4.22–5.81)
RDW: 14.1 % (ref 11.5–15.5)
WBC: 14.2 10*3/uL — ABNORMAL HIGH (ref 4.0–10.5)

## 2015-01-23 LAB — URINE MICROSCOPIC-ADD ON

## 2015-01-23 LAB — BLOOD GAS, VENOUS
Acid-base deficit: 10.5 mmol/L — ABNORMAL HIGH (ref 0.0–2.0)
Bicarbonate: 16 mEq/L — ABNORMAL LOW (ref 20.0–24.0)
O2 Saturation: 52.8 %
Patient temperature: 98.6
TCO2: 15.2 mmol/L (ref 0–100)
pCO2, Ven: 39.1 mmHg — ABNORMAL LOW (ref 45.0–50.0)
pH, Ven: 7.235 — ABNORMAL LOW (ref 7.250–7.300)

## 2015-01-23 LAB — CBC
HCT: 37 % — ABNORMAL LOW (ref 39.0–52.0)
Hemoglobin: 12 g/dL — ABNORMAL LOW (ref 13.0–17.0)
MCH: 29.9 pg (ref 26.0–34.0)
MCHC: 32.4 g/dL (ref 30.0–36.0)
MCV: 92.3 fL (ref 78.0–100.0)
Platelets: 354 10*3/uL (ref 150–400)
RBC: 4.01 MIL/uL — ABNORMAL LOW (ref 4.22–5.81)
RDW: 13.8 % (ref 11.5–15.5)
WBC: 13.2 10*3/uL — ABNORMAL HIGH (ref 4.0–10.5)

## 2015-01-23 LAB — GLUCOSE, CAPILLARY
GLUCOSE-CAPILLARY: 445 mg/dL — AB (ref 65–99)
GLUCOSE-CAPILLARY: 345 mg/dL — AB (ref 65–99)
GLUCOSE-CAPILLARY: 329 mg/dL — AB (ref 65–99)
Glucose-Capillary: 245 mg/dL — ABNORMAL HIGH (ref 65–99)

## 2015-01-23 LAB — URINALYSIS, ROUTINE W REFLEX MICROSCOPIC
Bilirubin Urine: NEGATIVE
Glucose, UA: >1000 mg/dL — AB
Ketones, ur: 40 mg/dL — AB
Nitrite: NEGATIVE
Protein, ur: 30 mg/dL — AB
Specific Gravity, Urine: 1.015 (ref 1.005–1.030)
Urobilinogen, UA: 0.2 mg/dL (ref 0.0–1.0)
pH: 5.5 (ref 5.0–8.0)

## 2015-01-23 LAB — MAGNESIUM: Magnesium: 2.1 mg/dL (ref 1.7–2.4)

## 2015-01-23 LAB — CBG MONITORING, ED

## 2015-01-23 LAB — MRSA PCR SCREENING: MRSA by PCR: NEGATIVE

## 2015-01-23 LAB — PHOSPHORUS: Phosphorus: 3.3 mg/dL (ref 2.5–4.6)

## 2015-01-23 MED ORDER — ALBUTEROL SULFATE (5 MG/ML) 0.5% IN NEBU: 5.0000 mg | INHALATION_SOLUTION | Freq: Four times a day (QID) | RESPIRATORY_TRACT | Status: DC | PRN

## 2015-01-23 MED ORDER — SODIUM CHLORIDE 0.9 % IV SOLN: 1000.0000 mL | INTRAVENOUS | Status: DC

## 2015-01-23 MED ORDER — MOMETASONE FURO-FORMOTEROL FUM 100-5 MCG/ACT IN AERO
2.0000 | INHALATION_SPRAY | Freq: Two times a day (BID) | RESPIRATORY_TRACT | Status: DC | PRN
  Filled 2015-01-23: qty 8.8

## 2015-01-23 MED ORDER — VITAMIN D 1000 UNITS PO TABS
1000.0000 [IU] | ORAL_TABLET | Freq: Every day | ORAL | Status: DC
Start: 2015-01-23 — End: 2015-01-26
  Administered 2015-01-23 – 2015-01-26 (×4): 1000 [IU] via ORAL
  Filled 2015-01-23 (×4): qty 1

## 2015-01-23 MED ORDER — ZOLPIDEM TARTRATE 5 MG PO TABS
5.0000 mg | ORAL_TABLET | Freq: Every day | ORAL | Status: DC | PRN
  Administered 2015-01-23 – 2015-01-25 (×3): 5 mg via ORAL
  Filled 2015-01-23 (×3): qty 1

## 2015-01-23 MED ORDER — DEXTROSE 5 % IV SOLN
1.0000 g | Freq: Once | INTRAVENOUS | Status: AC
  Administered 2015-01-23: 1 g via INTRAVENOUS
  Filled 2015-01-23: qty 10

## 2015-01-23 MED ORDER — SODIUM CHLORIDE 0.9 % IV SOLN
INTRAVENOUS | Status: DC
  Administered 2015-01-23: 3.9 [IU]/h via INTRAVENOUS
  Filled 2015-01-23: qty 2.5

## 2015-01-23 MED ORDER — INSULIN REGULAR HUMAN 100 UNIT/ML IJ SOLN
INTRAMUSCULAR | Status: DC
  Administered 2015-01-23: 5.4 [IU]/h via INTRAVENOUS
  Filled 2015-01-23: qty 2.5

## 2015-01-23 MED ORDER — ALBUTEROL 90 MCG/ACT IN AERS: 2.0000 | INHALATION_SPRAY | RESPIRATORY_TRACT | Status: DC | PRN

## 2015-01-23 MED ORDER — ONDANSETRON HCL 4 MG/2ML IJ SOLN: 4.0000 mg | Freq: Three times a day (TID) | INTRAMUSCULAR | Status: AC | PRN

## 2015-01-23 MED ORDER — SODIUM CHLORIDE 0.9 % IV SOLN: INTRAVENOUS | Status: DC

## 2015-01-23 MED ORDER — ONDANSETRON HCL 4 MG/2ML IJ SOLN
4.0000 mg | Freq: Once | INTRAMUSCULAR | Status: AC
  Administered 2015-01-23: 4 mg via INTRAVENOUS
  Filled 2015-01-23: qty 2

## 2015-01-23 MED ORDER — SODIUM CHLORIDE 0.9 % IV SOLN
INTRAVENOUS | Status: DC
  Administered 2015-01-23: 17:00:00 via INTRAVENOUS

## 2015-01-23 MED ORDER — SODIUM CHLORIDE 0.9 % IV BOLUS (SEPSIS)
2000.0000 mL | Freq: Once | INTRAVENOUS | Status: AC
  Administered 2015-01-23: 2000 mL via INTRAVENOUS

## 2015-01-23 MED ORDER — SIMVASTATIN 40 MG PO TABS
40.0000 mg | ORAL_TABLET | Freq: Every day | ORAL | Status: DC
  Administered 2015-01-23 – 2015-01-26 (×4): 40 mg via ORAL
  Filled 2015-01-23 (×4): qty 1

## 2015-01-23 MED ORDER — ALBUTEROL SULFATE (2.5 MG/3ML) 0.083% IN NEBU: 2.5000 mg | INHALATION_SOLUTION | RESPIRATORY_TRACT | Status: DC | PRN

## 2015-01-23 MED ORDER — DEXTROSE-NACL 5-0.45 % IV SOLN: INTRAVENOUS | Status: DC

## 2015-01-23 MED ORDER — DEXTROSE-NACL 5-0.45 % IV SOLN
INTRAVENOUS | Status: DC
  Administered 2015-01-23: 20:00:00 via INTRAVENOUS

## 2015-01-23 MED ORDER — DEXTROSE 5 % IV SOLN
1.0000 g | INTRAVENOUS | Status: DC
  Administered 2015-01-24 – 2015-01-25 (×2): 1 g via INTRAVENOUS
  Filled 2015-01-23 (×2): qty 10

## 2015-01-23 MED ORDER — ENOXAPARIN SODIUM 30 MG/0.3ML ~~LOC~~ SOLN
30.0000 mg | SUBCUTANEOUS | Status: DC
  Administered 2015-01-23 – 2015-01-25 (×3): 30 mg via SUBCUTANEOUS
  Filled 2015-01-23 (×3): qty 0.3

## 2015-01-23 MED ORDER — PRO-STAT 64 PO LIQD: 30.0000 mL | ORAL | Status: DC | PRN

## 2015-01-23 NOTE — ED Notes (Signed)
He c/o feeling weak and having occasional vomiting and diarrhea for past few days.  He is in no distress.  He also found his cbg to be elevated today.  He is alert and oriented x 4 with clear speech.

## 2015-01-23 NOTE — ED Notes (Signed)
Glucose 720 mg/dl Called by Carollee HerterShannon  RN aware.

## 2015-01-23 NOTE — H&P (Addendum)
[facility] History and Physical  Hughes Wyndham NWG:[id number] DOB: Nov 17, 1927 DOA: 01/23/2015  Referring physician: ER physician:Dr. Virgel Manifold PCP: Jerlyn Ly, MD  Chief Complaint: nausea, vomiting, diarrhea   HPI:  81 year old male with past medical history of diabetes, CKD stage 4, dyslipidemia, recent hospitalization for UTI (was discharge home with few more days of bactrim). He presented back to [facility] ED with nausea, non bloody vomiting and diarrhea for past few days prior to this admission. Pt reproted poor po intake and associated fatigue. No lightheadedness or loss of consciousness. No fevers or chills. No blood ins tool. No abdominal pain. No chest pain, palpitations, shortness of breath.    In ED, pt was hemodynamically stable. Blood work revealed glucose of 720, CBG more than 600, ketones of 40 in urine, elevated AG at 19, CO2 of 13. He was started on insulin drip. His UA also showed large leukocytes with TNTC WBC. Empiric rocephin started. Admitted to SDU due to DKA.  Assessment & Plan    Principal Problem:   DKA (diabetic ketoacidoses) - DKA criteria met with ketones in urine. Glucose of more than 1000 in urine, CBG more than 600, elevated AG, CO2 of 13 - Started insulin drip - Management per DKA protocol, CBG Q 1 hours and BMP Q 4 hours until AG closes or CO2 normalizes - Continue IV fluids   Active Problems: HLD (hyperlipidemia) - Continue statin therapy  Hyperkalemia - Likely from DKA - Will likely correct with  Insulin - No acute changes on 12 lead EKG  Asthmatic bronchitis , chronic / COPD GOLD III  - Stable - Not in exacerbation  ?UTI / Leukocytosis  - Pt just recently admitted for staph coag neg UTI and was d/c'ed with bactrim - Has large leukocytes on UA but few bacteria, too numerous to count WBC - Start empiric Rocephin  - Follow up urine culture results   Nausea, vomiting and diarrhea - unclear etiology - Rule out C. diff - Supportive  care with IV fluids and antiemetics as needed   Chronic kidney disease, stage 4 - On recent admission creatinine around 2.4 and on this admission 2.56 - He was on bactrim after the discharge for UTI which could have contributed to worsening renal function - Continue to hydrate per DKA protocol - Follow up BMP tomorrow am   Diabetes mellitus type 2, uncontrolled with renal manifestations - On recent admission in early 12/2014 A1c around 9 indicating less than goal glycemic control - Now on insulin drip per DKA protocol   History of chronic combined systolic and diastolic congestive heart failure - Last 2-D echocardiogram in February 2013 showed ejection fraction of 45-50% and grade 1 diastolic dysfunction. - Compensated  Malnutrition of moderate degree - Nutrition consulted   DVT prophylaxis:  - Lovenox subQ ordered   Radiological Exams on Admission: No results found.  EKG: I have personally reviewed EKG. EKG shows sinus rhythm   Code Status: Full Family Communication: Plan of care discussed with the patient  Disposition Plan: Admit for further evaluation to SDU due to DKA and insulin requirement   Leisa Lenz, MD  [facility] Pager [phone]  Time spent in minutes: 75 minutes  Review of Systems:  Constitutional: Negative for fever, chills and malaise/fatigue. Negative for diaphoresis.  HENT: Negative for hearing loss, ear pain, nosebleeds, congestion, sore throat, neck pain, tinnitus and ear discharge.   Eyes: Negative for blurred vision, double vision, photophobia, pain, discharge and redness.  Respiratory: Negative  for cough, hemoptysis, sputum production, shortness of breath, wheezing and stridor.   Cardiovascular: Negative for chest pain, palpitations, orthopnea, claudication and leg swelling.  Gastrointestinal: per HPI Genitourinary: Negative for dysuria, urgency, frequency, hematuria and flank pain.  Musculoskeletal: Negative for myalgias, back pain, joint  pain and falls.  Skin: Negative for itching and rash.  Neurological: Negative for dizziness and weakness. Negative for tingling, tremors, sensory change, speech change, focal weakness, loss of consciousness and headaches.  Endo/Heme/Allergies: Negative for environmental allergies and polydipsia. Does not bruise/bleed easily.  Psychiatric/Behavioral: Negative for suicidal ideas. The patient is not nervous/anxious.      Past Medical History  Diagnosis Date  . Diabetes mellitus   . Asthma   . Hydronephrosis   . Kidney disease     Stage III chronic kidney disease  . Hypertension   . Neurogenic bladder   . Osteoporosis   . Hypogonadism male   . Recurrent urinary tract infection     With resistent Pseudomonas  . Allergic rhinitis   . Hepatitis C     due to blood transfusion  . Hyperlipidemia   . SOB (shortness of breath)   . CKD (chronic kidney disease), stage III   . Combined systolic and diastolic congestive heart failure   . COPD (chronic obstructive pulmonary disease)    Past Surgical History  Procedure Laterality Date  . Hernia repair    . Tonsillectomy    . Bladder repair      obstruction surgery  . Frontal sinus obliteration      sinus repaired with a plate  . Cataract extraction  2015   Social History:  reports that he quit smoking about 3 years ago. His smoking use included Cigars and Cigarettes. He does not have any smokeless tobacco history on file. He reports that he does not drink alcohol or use illicit drugs.  Allergies  Allergen Reactions  . Codeine Nausea And Vomiting  . Gentamicin     Becomes dizzy and weak  . Ciprofloxacin Itching and Rash    Family History:  Family History  Problem Relation Age of Onset  . Heart failure Mother   . Hypertension Mother   . COPD Father     smoked  . Diabetes Father   . Cancer Sister     Breast  . Diabetes Sister   . COPD Brother     smoked     Prior to Admission medications   Medication Sig Start Date End  Date Taking? Authorizing Provider  albuterol (2.5 MG/3ML) 0.083% NEBU 3 mL, albuterol (5 MG/ML) 0.5% NEBU 0.5 mL Inhale 5 mg into the lungs every 6 (six) hours as needed (wheezing).   Yes Historical Provider, MD  albuterol (PROVENTIL,VENTOLIN) 90 MCG/ACT inhaler Inhale 2 puffs into the lungs every 4 (four) hours as needed for wheezing or shortness of breath.    Yes Historical Provider, MD  Amino Acids-Protein Hydrolys (FEEDING SUPPLEMENT, PRO-STAT 64,) LIQD Take 30 mLs by mouth 3 (three) times daily with meals. Patient taking differently: Take 30 mLs by mouth as needed (appetite).  01/07/15  Yes Modena Jansky, MD  cholecalciferol (VITAMIN D) 1000 UNITS tablet Take 1,000 Units by mouth daily.   Yes Historical Provider, MD  Insulin Glargine (TOUJEO SOLOSTAR) 300 UNIT/ML SOPN Inject 14 Units into the skin at bedtime.   Yes Historical Provider, MD  insulin lispro (HUMALOG) 100 UNIT/ML injection Inject 4-16 Units into the skin 3 (three) times daily as needed for high blood sugar (high blood  sugar). Adjusted to blood glucose level. Patient adds an additional unit for every 48m/dL   Yes Historical Provider, MD  mometasone-formoterol (DULERA) 100-5 MCG/ACT AERO Take 2 puffs first thing in am and then another 2 puffs about 12 hours later. Patient taking differently: Inhale 2 puffs into the lungs 2 (two) times daily as needed for wheezing (wheezing).  09/26/14  Yes MTanda Rockers MD  simvastatin (ZOCOR) 40 MG tablet Take 40 mg by mouth daily.     Yes Historical Provider, MD  Tiotropium Bromide Monohydrate (SPIRIVA RESPIMAT) 2.5 MCG/ACT AERS 2 puffs each am Patient taking differently: Take 1 puff by mouth daily.  11/23/14  Yes MTanda Rockers MD  UNABLE TO FIND Narrow rolling walker with 5 inch wheels. Diagnosis: Generalized weakness and functional limitations. 01/07/15  Yes AModena Jansky MD  zolpidem (AMBIEN) 5 MG tablet Take 5 mg by mouth daily as needed for sleep (sleep).  10/14/14  Yes Historical Provider,  MD   Physical Exam: Filed Vitals:   01/23/15 1319 01/23/15 1445  BP: 147/73 126/58  Pulse: 92 87  Temp: 97.3 F (36.3 C)   TempSrc: Oral   Resp: 16 16  SpO2: 96% 96%    Physical Exam  Constitutional: Appears well-developed and well-nourished. No distress.  HENT: Normocephalic. No tonsillar erythema or exudates Eyes: Conjunctivae are normal. No scleral icterus.  Neck: Normal ROM. Neck supple. No JVD. No tracheal deviation. No thyromegaly.  CVS: RRR, S1/S2 +, no murmurs, no gallops, no carotid bruit.  Pulmonary: Effort and breath sounds normal, no stridor, rhonchi, wheezes, rales.  Abdominal: Soft. BS +,  no distension, tenderness, rebound or guarding.  Musculoskeletal: Normal range of motion. No edema and no tenderness.  Lymphadenopathy: No lymphadenopathy noted, cervical, inguinal. Neuro: Alert. Normal reflexes, muscle tone coordination. No focal neurologic deficits. Skin: Skin is warm and dry. No rash noted.  No erythema. No pallor.  Psychiatric: Normal mood and affect. Behavior, judgment, thought content normal.   Labs on Admission:  Basic Metabolic Panel:  Recent Labs Lab 01/23/15 1400  NA 132*  K 5.9*  CL 100*  CO2 13*  GLUCOSE 720*  BUN 54*  CREATININE 2.56*  CALCIUM 8.9   Liver Function Tests: No results for input(s): AST, ALT, ALKPHOS, BILITOT, PROT, ALBUMIN in the last 168 hours. No results for input(s): LIPASE, AMYLASE in the last 168 hours. No results for input(s): AMMONIA in the last 168 hours. CBC:  Recent Labs Lab 01/23/15 1400  WBC 14.2*  NEUTROABS 12.7*  HGB 13.4  HCT 42.2  MCV 96.1  PLT 370   Cardiac Enzymes: No results for input(s): CKTOTAL, CKMB, CKMBINDEX, TROPONINI in the last 168 hours. BNP: Invalid input(s): POCBNP CBG:  Recent Labs Lab 01/23/15 1320  GLUCAP >600*    If 7PM-7AM, please contact night-coverage [email or web address] Password [email or web address]7/12/2014, 3:34 PM

## 2015-01-23 NOTE — ED Provider Notes (Signed)
CSN: [id number]     Arrival date & time 01/23/15  1310 History   First MD Initiated Contact with Patient 01/23/15 1338     Chief Complaint  Patient presents with  . Weakness     (Consider location/radiation/quality/duration/timing/severity/associated sxs/prior Treatment) HPI   81 year old male with nausea, vomiting, diarrhea hyperglycemia. Symptom onset about 3 days ago with diarrhea. Since yesterday's had nausea and vomiting. Hyperglycemia. Generalized weakness. Feels very fatigued. Denies any acute pain. No blood in his stool. No sick contacts. He reports being on antibiotics ~2 weeks ago for UTI. Humalog as needed for hyperglycemia. Did not take any today.   Past Medical History  Diagnosis Date  . Diabetes mellitus   . Asthma   . Hydronephrosis   . Kidney disease     Stage III chronic kidney disease  . Hypertension   . Neurogenic bladder   . Osteoporosis   . Hypogonadism male   . Recurrent urinary tract infection     With resistent Pseudomonas  . Allergic rhinitis   . Hepatitis C     due to blood transfusion  . Hyperlipidemia   . SOB (shortness of breath)   . CKD (chronic kidney disease), stage III   . Combined systolic and diastolic congestive heart failure   . COPD (chronic obstructive pulmonary disease)    Past Surgical History  Procedure Laterality Date  . Hernia repair    . Tonsillectomy    . Bladder repair      obstruction surgery  . Frontal sinus obliteration      sinus repaired with a plate  . Cataract extraction  2015   Family History  Problem Relation Age of Onset  . Heart failure Mother   . Hypertension Mother   . COPD Father     smoked  . Diabetes Father   . Cancer Sister     Breast  . Diabetes Sister   . COPD Brother     smoked   History  Substance Use Topics  . Smoking status: Former Smoker    Types: Cigars, Cigarettes    Quit date: 07/21/2011  . Smokeless tobacco: Not on file     Comment: smoked occ cigar and pipe  . Alcohol Use: No     Review of Systems  All systems reviewed and negative, other than as noted in HPI.    Allergies  Codeine; Gentamicin; and Ciprofloxacin  Home Medications   Prior to Admission medications   Medication Sig Start Date End Date Taking? Authorizing Provider  albuterol (2.5 MG/3ML) 0.083% NEBU 3 mL, albuterol (5 MG/ML) 0.5% NEBU 0.5 mL Inhale 5 mg into the lungs every 6 (six) hours as needed (wheezing).   Yes Historical Provider, MD  albuterol (PROVENTIL,VENTOLIN) 90 MCG/ACT inhaler Inhale 2 puffs into the lungs every 4 (four) hours as needed for wheezing or shortness of breath.    Yes Historical Provider, MD  Amino Acids-Protein Hydrolys (FEEDING SUPPLEMENT, PRO-STAT 64,) LIQD Take 30 mLs by mouth 3 (three) times daily with meals. Patient taking differently: Take 30 mLs by mouth as needed (appetite).  01/07/15  Yes Elease Etienne, MD  cholecalciferol (VITAMIN D) 1000 UNITS tablet Take 1,000 Units by mouth daily.   Yes Historical Provider, MD  Insulin Glargine (TOUJEO SOLOSTAR) 300 UNIT/ML SOPN Inject 14 Units into the skin at bedtime.   Yes Historical Provider, MD  insulin lispro (HUMALOG) 100 UNIT/ML injection Inject 4-16 Units into the skin 3 (three) times daily as needed for high blood sugar (  high blood sugar). Adjusted to blood glucose level. Patient adds an additional unit for every /dL   Yes Historical Provider, MD  mometasone-formoterol (DULERA) 100-5 MCG/ACT AERO Take 2 puffs first thing in am and then another 2 puffs about 12 hours later. Patient taking differently: Inhale 2 puffs into the lungs 2 (two) times daily as needed for wheezing (wheezing).  09/26/14  Yes Nyoka Cowden, MD  simvastatin (ZOCOR) 40 MG tablet Take 40 mg by mouth daily.     Yes Historical Provider, MD  Tiotropium Bromide Monohydrate (SPIRIVA RESPIMAT) 2.5 MCG/ACT AERS 2 puffs each am Patient taking differently: Take 1 puff by mouth daily.  11/23/14  Yes Nyoka Cowden, MD  UNABLE TO FIND Narrow rolling walker  with 5 inch wheels. Diagnosis: Generalized weakness and functional limitations. 01/07/15  Yes Elease Etienne, MD  zolpidem (AMBIEN) 5 MG tablet Take 5 mg by mouth daily as needed for sleep (sleep).  10/14/14  Yes Historical Provider, MD   BP 147/73 mmHg  Pulse 92  Temp(Src) 97.3 F (36.3 C) (Oral)  Resp 16  SpO2 96% Physical Exam  Constitutional: He appears well-developed and well-nourished. No distress.  Laying in bed. Appears tired, but not toxic.  HENT:  Head: Normocephalic and atraumatic.  Eyes: Conjunctivae and EOM are normal. Pupils are equal, round, and reactive to light. Right eye exhibits no discharge. Left eye exhibits no discharge.  Neck: Neck supple.  Cardiovascular: Normal rate, regular rhythm and normal heart sounds.  Exam reveals no gallop and no friction rub.   No murmur heard. Pulmonary/Chest: Effort normal and breath sounds normal. No respiratory distress.  Abdominal: Soft. He exhibits no distension. There is no tenderness.  Musculoskeletal: He exhibits no edema or tenderness.  Neurological: He is alert.  Skin: Skin is warm and dry.  Psychiatric: He has a normal mood and affect. His behavior is normal. Thought content normal.  Nursing note and vitals reviewed.   ED Course  Procedures (including critical care time)  CRITICAL CARE Performed by: Raeford Razor Total critical care time: 35 minutes Critical care time was exclusive of separately billable procedures and treating other patients. Critical care was necessary to treat or prevent imminent or life-threatening deterioration. Critical care was time spent personally by me on the following activities: development of treatment plan with patient and/or surrogate as well as nursing, discussions with consultants, evaluation of patient's response to treatment, examination of patient, obtaining history from patient or surrogate, ordering and performing treatments and interventions, ordering and review of laboratory  studies, ordering and review of radiographic studies, pulse oximetry and re-evaluation of patient's condition.  Labs Review Labs Reviewed  CBC WITH DIFFERENTIAL/PLATELET - Abnormal; Notable for the following:    WBC 14.2 (*)    Neutrophils Relative % 90 (*)    Neutro Abs 12.7 (*)    Lymphocytes Relative 7 (*)    All other components within normal limits  BASIC METABOLIC PANEL - Abnormal; Notable for the following:    Sodium 132 (*)    Potassium 5.9 (*)    Chloride 100 (*)    CO2 13 (*)    Glucose, Bld 720 (*)    BUN 54 (*)    Creatinine, Ser 2.56 (*)    GFR calc non Af Amer 21 (*)    GFR calc Af Amer 25 (*)    Anion gap 19 (*)    All other components within normal limits  URINALYSIS, ROUTINE W REFLEX MICROSCOPIC (NOT AT [facility]) -  Abnormal; Notable for the following:    APPearance TURBID (*)    Glucose, UA >1000 (*)    Hgb urine dipstick MODERATE (*)    Ketones, ur 40 (*)    Protein, ur 30 (*)    Leukocytes, UA LARGE (*)    All other components within normal limits  URINE MICROSCOPIC-ADD ON - Abnormal; Notable for the following:    Squamous Epithelial / LPF FEW (*)    Bacteria, UA FEW (*)    All other components within normal limits  CBG MONITORING, ED - Abnormal; Notable for the following:    Glucose-Capillary >600 (*)    All other components within normal limits  URINE CULTURE  GI PATHOGEN PANEL BY PCR, STOOL  BLOOD GAS, VENOUS    Imaging Review No results found.   EKG Interpretation   Date/Time:  Wednesday January 23 2015 13:25:17 EDT Ventricular Rate:  92 PR Interval:  179 QRS Duration: 94 QT Interval:  360 QTC Calculation: 445 R Axis:   47 Text Interpretation:  Sinus rhythm Anteroseptal infarct, old t waves more  prominent then prior, consider hyperkalemia Confirmed by Juleen ChinaKOHUT  MD,  Luvinia Lucy ([id number]) on 01/23/2015 2:52:48 PM      MDM   Final diagnoses:  Diabetic ketoacidosis   Dehydration  UTI (lower urinary tract infection)    81 year old male with  generalized weakness, nausea/vomiting, diarrhea and hyperglycemia. Workup is significant for diabetic ketoacidosis. Patient is not encephalopathic. IV fluid boluses. Continued fluids. Insulin drip. Patient is hyperkalemic with questionable EKG changes Will correct with insulin being started. Can further be followed on serial labs. UA with probable UTI. Rocephin. This as well as intermittent adherence with medications likely precipitating. Discussed with medicine for admission to stepdown.    Raeford RazorStephen Samyrah Bruster, MD 01/23/15 [phone]

## 2015-01-23 NOTE — Progress Notes (Signed)
Utilization Review completed.  [name] RN CM  

## 2015-01-23 NOTE — ED Notes (Signed)
Bed: WA14 Expected date:  Expected time:  Means of arrival:  Comments: EMS/hyperglycemia 

## 2015-01-23 NOTE — ED Notes (Signed)
He remains awake, alert and in no distress. His wife is with him and they agree with plan to admit, about which Dr. Juleen ChinaKohut has just spoken with them

## 2015-01-24 DIAGNOSIS — N390 Urinary tract infection, site not specified: Secondary | ICD-10-CM

## 2015-01-24 DIAGNOSIS — N184 Chronic kidney disease, stage 4 (severe): Secondary | ICD-10-CM

## 2015-01-24 LAB — GLUCOSE, CAPILLARY
GLUCOSE-CAPILLARY: 83 mg/dL (ref 65–99)
GLUCOSE-CAPILLARY: 75 mg/dL (ref 65–99)
GLUCOSE-CAPILLARY: 143 mg/dL — AB (ref 65–99)
GLUCOSE-CAPILLARY: 196 mg/dL — AB (ref 65–99)
Glucose-Capillary: 111 mg/dL — ABNORMAL HIGH (ref 65–99)
Glucose-Capillary: 125 mg/dL — ABNORMAL HIGH (ref 65–99)
Glucose-Capillary: 172 mg/dL — ABNORMAL HIGH (ref 65–99)
Glucose-Capillary: 198 mg/dL — ABNORMAL HIGH (ref 65–99)
Glucose-Capillary: 155 mg/dL — ABNORMAL HIGH (ref 65–99)
Glucose-Capillary: 265 mg/dL — ABNORMAL HIGH (ref 65–99)
Glucose-Capillary: 163 mg/dL — ABNORMAL HIGH (ref 65–99)

## 2015-01-24 LAB — BASIC METABOLIC PANEL
ANION GAP: 5 (ref 5–15)
Anion gap: 8 (ref 5–15)
BUN: 40 mg/dL — ABNORMAL HIGH (ref 6–20)
BUN: 36 mg/dL — AB (ref 6–20)
CO2: 20 mmol/L — ABNORMAL LOW (ref 22–32)
CO2: 24 mmol/L (ref 22–32)
CREATININE: 1.73 mg/dL — AB (ref 0.61–1.24)
CREATININE: 1.75 mg/dL — AB (ref 0.61–1.24)
Calcium: 8 mg/dL — ABNORMAL LOW (ref 8.9–10.3)
Calcium: 7.9 mg/dL — ABNORMAL LOW (ref 8.9–10.3)
Chloride: 110 mmol/L (ref 101–111)
Chloride: 108 mmol/L (ref 101–111)
GFR calc Af Amer: 40 mL/min — ABNORMAL LOW (ref >60)
GFR calc Af Amer: 39 mL/min — ABNORMAL LOW (ref >60)
GFR calc non Af Amer: 34 mL/min — ABNORMAL LOW (ref >60)
GFR, EST NON AFRICAN AMERICAN: 34 mL/min — AB (ref >60)
GLUCOSE: 108 mg/dL — AB (ref 65–99)
Glucose, Bld: 173 mg/dL — ABNORMAL HIGH (ref 65–99)
Potassium: 4.1 mmol/L (ref 3.5–5.1)
Potassium: 4.3 mmol/L (ref 3.5–5.1)
Sodium: 139 mmol/L (ref 135–145)
Sodium: 136 mmol/L (ref 135–145)

## 2015-01-24 MED ORDER — INSULIN GLARGINE 100 UNIT/ML ~~LOC~~ SOLN
10.0000 [IU] | Freq: Every day | SUBCUTANEOUS | Status: DC
  Administered 2015-01-24 (×2): 10 [IU] via SUBCUTANEOUS
  Filled 2015-01-24 (×2): qty 0.1

## 2015-01-24 MED ORDER — ONDANSETRON HCL 4 MG/2ML IJ SOLN: 4.0000 mg | Freq: Three times a day (TID) | INTRAMUSCULAR | Status: AC | PRN

## 2015-01-24 MED ORDER — INSULIN ASPART 100 UNIT/ML ~~LOC~~ SOLN
0.0000 [IU] | Freq: Three times a day (TID) | SUBCUTANEOUS | Status: DC
  Administered 2015-01-24: 5 [IU] via SUBCUTANEOUS
  Administered 2015-01-24 (×2): 2 [IU] via SUBCUTANEOUS
  Administered 2015-01-25: 3 [IU] via SUBCUTANEOUS
  Administered 2015-01-25: 7 [IU] via SUBCUTANEOUS

## 2015-01-24 NOTE — Progress Notes (Signed)
Initial Nutrition Assessment  DOCUMENTATION CODES:  Severe malnutrition in context of chronic illness  INTERVENTION: - Continue order for Prostat PRN - RD will monitor for need for additional supplements  NUTRITION DIAGNOSIS:  Inadequate oral intake related to acute illness as evidenced by per patient/family report, meal completion < 50%.  GOAL:  Patient will meet greater than or equal to 90% of their needs  MONITOR:  PO intake, Supplement acceptance, Weight trends, Labs, I & O's  REASON FOR ASSESSMENT:  Malnutrition Screening Tool, Consult Diet education  ASSESSMENT: 81 year old male with past medical history of diabetes, CKD stage 4, dyslipidemia, recent hospitalization for UTI (was discharge home with few more days of bactrim). [facility] with nausea, non bloody vomiting and diarrhea for past few days prior to this admission. Pt reproted poor po intake and associated fatigue. No lightheadedness or loss of consciousness.   Pt seen for MST and diet education. BMI indicates normal weight status. Pt indicates 8-9 lb weight loss recently. Per chart review, pt has lost 9 lbs (6% body weight) in the past 2 months which is significant for time frame. Moderate to severe muscle and moderate to severe fat wasting noted.  Pt indicates he was able to eat a little bit of breakfast this AM and keep it down. He states nausea with vomiting x3-4 days and inability to keep anything down, including fluids during this time.  Wife indicates that pt was dx with DM 25 years ago and that his blood sugars were always well controlled until 1 year ago. Current CBGs: 75-345 mg/dL. Pt ordered Novolog sliding scale and 10 units Lantus at bedtime.  Talked with pt and wife about carbohydrate counting, spreading carbohydrates throughout the day to avoid highs and lows, and pairing protein foods with carbohydrate foods during meals and snacks for improved muscle health given weight loss. Provided  pt with "Carbohydrate Counting for People with DM" handout from the Academy of Nutrition and Dietetics. Expect good to fair compliance.  Not meeting needs; will monitor for need for additional supplements. Medications reviewed. Labs reviewed; BUN/creatinine elevated, Ca: 7.9 mg/dL, GFR: 34.  Height:  Ht Readings from Last 1 Encounters:  01/24/15 5\' 9"  (1.753 m)    Weight:  Wt Readings from Last 1 Encounters:  01/23/15 137 lb 5.6 oz (62.3 kg)    Ideal Body Weight:  72.7 kg (kg)  Wt Readings from Last 10 Encounters:  01/23/15 137 lb 5.6 oz (62.3 kg)  01/07/15 138 lb 4.8 oz (62.732 kg)  11/23/14 146 lb (66.225 kg)  09/26/14 143 lb (64.864 kg)  08/31/14 143 lb 6.4 oz (65.046 kg)  11/23/13 149 lb (67.586 kg)  09/14/11 141 lb 6.4 oz (64.139 kg)  09/01/11 144 lb 9.6 oz (65.59 kg)  12/11/09 155 lb 12.8 oz (70.67 kg)  08/31/07 157 lb (71.215 kg)    BMI:  Body mass index is 20.27 kg/(m^2).  Estimated Nutritional Needs:  Kcal:  1550-1750  Protein:  65-75 grams  Fluid:  2 L/day  Skin:  Wound (see comment) (ecchymosis to bilateral arms)  Diet Order:  Diet Carb Modified Fluid consistency:: Thin; Room service appropriate?: Yes  EDUCATION NEEDS:  Education needs addressed   Intake/Output Summary (Last 24 hours) at 01/24/15 1142 Last data filed at 01/24/15 0900  Gross per 24 hour  Intake 1213.28 ml  Output   1150 ml  Net  63.28 ml    Last BM:  PTA     Trenton GammonJessica Alyah Boehning,  RD, LDN Inpatient Clinical Dietitian Pager # [phone] After hours/weekend pager # [phone]

## 2015-01-24 NOTE — Progress Notes (Signed)
Patient ID: Benjamin Riddle, male   DOB: 1927/08/08, 81 y.o.   MRN: [id number] [facility] PROGRESS NOTE  Benjamin Riddle UEK:[id number] DOB: 03-31-1928 DOA: 01/23/2015 PCP: Jerlyn Ly, MD  Brief narrative:    81 year old male with past medical history of diabetes, CKD stage 4, dyslipidemia, recent hospitalization for UTI (was discharge home with few more days of bactrim). He presented back to [facility] ED with nausea, non bloody vomiting and diarrhea for past few days prior to this admission. Pt reproted poor po intake and associated fatigue. No lightheadedness or loss of consciousness. No fevers.  In ED, pt was hemodynamically stable. Blood work revealed glucose of 720, CBG more than 600, ketones of 40 in urine, elevated AG at 19, CO2 of 13. He was started on insulin drip. His UA also showed large leukocytes with TNTC WBC. Empiric rocephin started. Admitted to SDU due to DKA.  Barrier to discharge: Anticipate discharge once urine culture results are back, probably by 01/26/2015.  Assessment/Plan:    Principal Problem:  DKA (diabetic ketoacidoses) - DKA criteria met on admission with presence of ketones in the urine as well as glucose more than 1000 on urinalysis. BMP revealed AG of 19, CO2 13, glucose of 720. - Patient was started on insulin drip per DKA protocol. - He was admitted to stepdown unit for first 24 hours observation. - This morning, anion gap closed and CBG's 222, 108, 173 - Currently on Lantus 10 units at bedtime along with sliding scale insulin. - If CBGs trended up then resume patient's insulin regimen from home.  Active Problems: HLD (hyperlipidemia) - Continue simvastatin 40 mg daily  Hyperkalemia - Likely from DKA - corrected with insulin  Asthmatic bronchitis , chronic / COPD GOLD III  - Stable - Not in exacerbation  Urinary tract infection / leukocytosis / nausea, vomiting and diarrhea  - Pt just recently admitted for staph coag neg UTI and was d/c'ed with  bactrim - Of obvious concern is possible C. difficile infection. GI pathogen panel and C. difficile are pending. - Patient had large leukocytes on urinalysis and too numerous to count white blood cell count - We started empiric Rocephin - Urine culture is pending  Chronic kidney disease, stage 4 - On recent admission creatinine around 2.4 and on this admission 2.56 - Possibly exacerbated by recent Bactrim - Creatinine improved with IV fluids, 1.75 this morning  Diabetes mellitus type 2, uncontrolled with renal manifestations - On recent admission in early 12/2014 A1c around 9 indicating less than goal glycemic control - Currently on Lantus 10 units at bedtime and sliding scale insulin.  History of chronic combined systolic and diastolic congestive heart failure - Last 2-D echocardiogram in February 2013 showed ejection fraction of 45-50% and grade 1 diastolic dysfunction. - Compensated  Malnutrition of moderate degree - Nutrition consulted  DVT prophylaxis:  - Lovenox subQ ordered while patient in hospital   Code Status: Full.  Family Communication:  plan of care discussed with the patient Disposition Plan: transfer to telemetry today   IV access:  Peripheral IV  Procedures and diagnostic studies:    No results found.  Medical Consultants:  None   Other Consultants:  Physical therapy  IAnti-Infectives:   Rocephin 01/23/2015 -->    Tegh Franek, MD  [facility] Pager [phone]  Time spent in minutes: 25 minutes  If 7PM-7AM, please contact night-coverage [email or web address] Password [email or web address] 01/24/2015, 9:59 AM   LOS: 1 day    HPI/Subjective: No acute overnight events.  Patient reports no diarrhea this am.   Objective: Filed Vitals:   01/24/15 0600 01/24/15 0700 01/24/15 0800 01/24/15 0900  BP: 130/78 166/81 115/75   Pulse: 68 70 70 72  Temp:   97.6 F (36.4 C)   TempSrc:   Axillary   Resp: _0 Height:      Weight:      SpO2: 94% 95% 92% 94%     Intake/Output Summary (Last 24 hours) at 01/24/15 0959 Last data filed at 01/24/15 0900  Gross per 24 hour  Intake 1213.28 ml  Output   1150 ml  Net  63.28 ml    Exam:   General:  Pt is alert, follows commands appropriately, not in acute distress  Cardiovascular: Regular rate and rhythm, S1/S2 appreciated   Respiratory: Clear to auscultation bilaterally, no wheezing, no crackles, no rhonchi  Abdomen: Soft, non tender, non distended, bowel sounds present  Extremities: No edema, pulses DP and PT palpable bilaterally  Neuro: Grossly nonfocal  Data Reviewed: Basic Metabolic Panel:  Recent Labs Lab 01/23/15 1400 01/23/15 1653 01/23/15 2110 01/24/15 0018 01/24/15 0540  NA 132* 136 136 139 136  K 5.9* 4.9 4.4 4.1 4.3  CL 100* 105 109 110 108  CO2 13* 20* 20* 24 20*  GLUCOSE 720* 474* 222* 108* 173*  BUN 54* 50* 44* 40* 36*  CREATININE 2.56* 2.07* 1.94* 1.73* 1.75*  CALCIUM 8.9 8.1* 7.9* 8.0* 7.9*  MG  --  2.1  --   --   --   PHOS  --  3.3  --   --   --    Liver Function Tests: No results for input(s): AST, ALT, ALKPHOS, BILITOT, PROT, ALBUMIN in the last 168 hours. No results for input(s): LIPASE, AMYLASE in the last 168 hours. No results for input(s): AMMONIA in the last 168 hours. CBC:  Recent Labs Lab 01/23/15 1400 01/23/15 1653  WBC 14.2* 13.2*  NEUTROABS 12.7*  --   HGB 13.4 12.0*  HCT 42.2 37.0*  MCV 96.1 92.3  PLT 370 354   Cardiac Enzymes: No results for input(s): CKTOTAL, CKMB, CKMBINDEX, TROPONINI in the last 168 hours. BNP: Invalid input(s): POCBNP CBG:  Recent Labs Lab 01/23/15 2209 01/23/15 2343 01/24/15 0117 01/24/15 0216 01/24/15 0314  GLUCAP 172* 111* 83 75 125*    Recent Results (from the past 240 hour(s))  MRSA PCR Screening     Status: None   Collection Time: 01/23/15  5:04 PM  Result Value Ref Range Status   MRSA by PCR NEGATIVE NEGATIVE Final     Scheduled Meds: . cefTRIAXone (ROCEPHIN)  IV  1 g Intravenous Q24H   . cholecalciferol  1,000 Units Oral Daily  . enoxaparin (LOVENOX) injection  30 mg Subcutaneous Q24H  . insulin aspart  0-9 Units Subcutaneous TID WC  . insulin glargine  10 Units Subcutaneous QHS  . simvastatin  40 mg Oral Daily   Continuous Infusions: . sodium chloride Stopped (01/23/15 2000)  . dextrose 5 % and 0.45% NaCl Stopped (01/24/15 0430)

## 2015-01-24 NOTE — Progress Notes (Signed)
Inpatient Diabetes Program Recommendations  AACE/ADA: New Consensus Statement on Inpatient Glycemic Control (2013)  Target Ranges:  Prepandial:   less than 140 mg/dL      Peak postprandial:   less than 180 mg/dL (1-2 hours)      Critically ill patients:  140 - 180 mg/dL   Results for Benjamin Riddle, Benjamin Riddle (MRN [id number]) as of 01/24/2015 10:52  Ref. Range 01/23/2015 14:00  Sodium Latest Ref Range: 135-145 mmol/L 132 (L)  Potassium Latest Ref Range: 3.5-5.1 mmol/L 5.9 (H)  Chloride Latest Ref Range: 101-111 mmol/L 100 (L)  CO2 Latest Ref Range: 22-32 mmol/L 13 (L)  BUN Latest Ref Range: 6-20 mg/dL 54 (H)  Creatinine Latest Ref Range: 0.61-1.24 mg/dL 2.56 (H)  Calcium Latest Ref Range: 8.9-10.3 mg/dL 8.9  EGFR (Non-African Amer.) Latest Ref Range: >60 mL/min 21 (L)  EGFR (African American) Latest Ref Range: >60 mL/min 25 (L)  Glucose Latest Ref Range: 65-99 mg/dL 720 (HH)  Anion gap Latest Ref Range: 5-15  19 (H)    Admit with: DKA  History: DM  Home DM Meds: Toujeo 14 units QHS        Humalog- 8 units breakfast/ 4 units lunch/ 8 units dinner         Humalog 1 unit for every 50 mg/dl above target CBG   Current DM Orders: Lantus 10 units QHS             Novolog Sensitive SSI (0-9 units) TID AC     -Note patient transitioned off IV insulin drip at ~3AM today.  Was given Lantus 10 units at 3AM.  Novolog started at 6AM.  -Sees Dr. Joylene Draft for DM care at [facility].  -Spoke with patient and his wife about the events leading to patient's admission.  Patient told me he took his insulin as ordered but felt worse and decided to come to the ED.  Discussed/Explained DKA diagnosis with pt and wife and the treatment plan.  Encouraged patient to check his CBGs more often at home during sick days and to drink plenty of water if possible to stay hydrated.  Also reminded patient to always take his Toujeo insulin even when not eating.  -CBGs stable so far on current regimen.    Will  follow Wyn Quaker RN, MSN, CDE Diabetes Coordinator Inpatient Glycemic Control Team Team Pager: [phone] (8a-5p)

## 2015-01-25 DIAGNOSIS — E43 Unspecified severe protein-calorie malnutrition: Secondary | ICD-10-CM | POA: Insufficient documentation

## 2015-01-25 LAB — BASIC METABOLIC PANEL
Anion gap: 7 (ref 5–15)
BUN: 24 mg/dL — ABNORMAL HIGH (ref 6–20)
CALCIUM: 8.6 mg/dL — AB (ref 8.9–10.3)
CO2: 25 mmol/L (ref 22–32)
Chloride: 106 mmol/L (ref 101–111)
Creatinine, Ser: 1.52 mg/dL — ABNORMAL HIGH (ref 0.61–1.24)
GFR calc Af Amer: 47 mL/min — ABNORMAL LOW (ref >60)
GFR, EST NON AFRICAN AMERICAN: 40 mL/min — AB (ref >60)
GLUCOSE: 136 mg/dL — AB (ref 65–99)
Potassium: 3.9 mmol/L (ref 3.5–5.1)
SODIUM: 138 mmol/L (ref 135–145)

## 2015-01-25 LAB — GLUCOSE, CAPILLARY
GLUCOSE-CAPILLARY: 43 mg/dL — AB (ref 65–99)
GLUCOSE-CAPILLARY: 68 mg/dL (ref 65–99)
GLUCOSE-CAPILLARY: 247 mg/dL — AB (ref 65–99)
GLUCOSE-CAPILLARY: 343 mg/dL — AB (ref 65–99)
GLUCOSE-CAPILLARY: 112 mg/dL — AB (ref 65–99)

## 2015-01-25 LAB — HEMOGLOBIN A1C
Hgb A1c MFr Bld: 9.8 % — ABNORMAL HIGH (ref 4.8–5.6)
MEAN PLASMA GLUCOSE: 235 mg/dL

## 2015-01-25 MED ORDER — INSULIN GLARGINE 100 UNIT/ML ~~LOC~~ SOLN
14.0000 [IU] | Freq: Every day | SUBCUTANEOUS | Status: DC
  Administered 2015-01-25: 14 [IU] via SUBCUTANEOUS
  Filled 2015-01-25: qty 0.14

## 2015-01-25 NOTE — Care Management (Signed)
IM LETTER GIVEN TO WIFE Benjamin Riddle Important Message  Patient Details  Name: Benjamin Riddle MRN: [id number] Date of Birth: [birth year]   Medicare Important Message Given:  Yes-second notification given    Haskell FlirtJamison, Hollie Bartus 01/25/2015, 11:16 AM

## 2015-01-25 NOTE — Progress Notes (Signed)
Patient ID: Benjamin Riddle, male   DOB: 1928/05/21, 81 y.o.   MRN: [id number] [facility] PROGRESS NOTE  Gabrian Hoque [id number]:[id number] DOB: October 04, 1927 DOA: 01/23/2015 PCP: Jerlyn Ly, MD  Brief narrative:    81 year old male with past medical history of diabetes, CKD stage 4, dyslipidemia, recent hospitalization for UTI (was discharge home with few more days of bactrim). He presented back to [facility] ED with nausea, non bloody vomiting and diarrhea for past few days prior to this admission. Pt reproted poor po intake and associated fatigue. N  In ED, pt was hemodynamically stable. Blood work revealed glucose of 720, CBG more than 600, ketones of 40 in urine, elevated AG at 19, CO2 of 13. He was started on insulin drip. His UA also showed large leukocytes with TNTC WBC. Empiric rocephin started. Admitted to SDU due to DKA. Transferred out of SDU 01/24/2015.  Barrier to discharge: Anticipate discharge once urine culture results are back, likely by 01/26/2015.  Assessment/Plan:    Principal Problem:  DKA (diabetic ketoacidoses) - DKA criteria met on admission with presence of ketones in the urine as well as glucose more than 1000 on urinalysis. BMP revealed AG of 19, CO2 13, glucose of 720. - Patient was on insulin drip at the time of the admission per DKA protocol. He was in the stepdown unit. - Insulin drip off on the morning of 01/25/2015. - Patient is currently on Lantus 10 units at bedtime along with sliding scale insulin. - CBGs in past 24 hours: 265, 196, 163 - for this reason we will increase the Lantus to patient's home Lantus regimen which is 14 units at bedtime - Diabetic coordinator has seen the patient in consultation. Appreciate their recommendations.  Active Problems: HLD (hyperlipidemia) - Continue simvastatin 40 mg daily  Hyperkalemia - Likely from DKA - Normalized with insulin.  Asthmatic bronchitis , chronic / COPD GOLD III  - Stable - Not in acute  exacerbation  Urinary tract infection / leukocytosis / nausea, vomiting and diarrhea  - Pt just recently admitted for staph coag neg UTI and was d/c'ed with bactrim - Patient's diarrhea completely resolved. GI pathogen panel is pending but C. difficile was not collected because patient did not have diarrhea anymore. - Urinalysis on admission revealed leukocytes and white blood cells. His urine culture was re-incubated for better growth. - He is on empiric Rocephin until final report of urine culture is back.  Chronic kidney disease, stage 4 - On recent admission creatinine around 2.4 and on this admission 2.56 - Probably secondary to Bactrim that he recently took for UTI. - Creatinine improving with fluids. It is 1.52 this morning.  Diabetes mellitus type 2, uncontrolled with renal manifestations - On recent admission in early 12/2014 A1c around 9 indicating less than goal glycemic control - Increase Lantus to 14 units at bedtime and sliding scale insulin.  History of chronic combined systolic and diastolic congestive heart failure - Last 2-D echocardiogram in February 2013 showed ejection fraction of 45-50% and grade 1 diastolic dysfunction. - Compensated  Severe protein calorie malnutrition - Nutrition consulted  DVT prophylaxis:  - Lovenox subQ ordered while patient in hospital   Code Status: Full.  Family Communication:  plan of care discussed with the patient Disposition Plan: Home by 01/26/2015 if urine culture results are back.  IV access:  Peripheral IV  Procedures and diagnostic studies:    No results found.  Medical Consultants:  None   Other Consultants:  Physical therapy  IAnti-Infectives:   Rocephin 01/23/2015 -->    Jurni Cesaro, MD  [facility] Pager [phone]  Time spent in minutes: 25 minutes  If 7PM-7AM, please contact night-coverage [email or web address] Password [email or web address] 01/25/2015, 10:15 AM   LOS: 2 days    HPI/Subjective: No acute overnight  events. Patient reports feeling weak.  Objective: Filed Vitals:   01/24/15 1342 01/24/15 2057 01/25/15 0514 01/25/15 0642  BP: 130/77 150/78 168/67 159/87  Pulse: 66 67 73   Temp: 98.1 F (36.7 C) 98.2 F (36.8 C) 98 F (36.7 C)   TempSrc: Oral Oral Oral   Resp: _0 Height:      Weight:      SpO2: 100% 100% 98%     Intake/Output Summary (Last 24 hours) at 01/25/15 1015 Last data filed at 01/25/15 1003  Gross per 24 hour  Intake    780 ml  Output      0 ml  Net    780 ml    Exam:   Riddle:  Pt is alert, no distress  Cardiovascular: Regular rate, appreciate S1-S2  Respiratory: No wheezing, no crackles  Abdomen: Nontender, nondistended abdomen, appreciate bowel sounds  Extremities: No leg swelling, pulses palpable  Neuro: Nonfocal  Data Reviewed: Basic Metabolic Panel:  Recent Labs Lab 01/23/15 1653 01/23/15 2110 01/24/15 0018 01/24/15 0540 01/25/15 0904  NA 136 136 139 136 138  K 4.9 4.4 4.1 4.3 3.9  CL 105 109 110 108 106  CO2 20* 20* 24 20* 25  GLUCOSE 474* 222* 108* 173* 136*  BUN 50* 44* 40* 36* 24*  CREATININE 2.07* 1.94* 1.73* 1.75* 1.52*  CALCIUM 8.1* 7.9* 8.0* 7.9* 8.6*  MG 2.1  --   --   --   --   PHOS 3.3  --   --   --   --    Liver Function Tests: No results for input(s): AST, ALT, ALKPHOS, BILITOT, PROT, ALBUMIN in the last 168 hours. No results for input(s): LIPASE, AMYLASE in the last 168 hours. No results for input(s): AMMONIA in the last 168 hours. CBC:  Recent Labs Lab 01/23/15 1400 01/23/15 1653  WBC 14.2* 13.2*  NEUTROABS 12.7*  --   HGB 13.4 12.0*  HCT 42.2 37.0*  MCV 96.1 92.3  PLT 370 354   Cardiac Enzymes: No results for input(s): CKTOTAL, CKMB, CKMBINDEX, TROPONINI in the last 168 hours. BNP: Invalid input(s): POCBNP CBG:  Recent Labs Lab 01/24/15 0427 01/24/15 0723 01/24/15 1202 01/24/15 1714 01/24/15 2056  GLUCAP 155* 143* 265* 196* 163*    Recent Results (from the past 240 hour(s))  MRSA  PCR Screening     Status: None   Collection Time: 01/23/15  5:04 PM  Result Value Ref Range Status   MRSA by PCR NEGATIVE NEGATIVE Final     Scheduled Meds: . cefTRIAXone (ROCEPHIN)  IV  1 g Intravenous Q24H  . cholecalciferol  1,000 Units Oral Daily  . enoxaparin (LOVENOX) injection  30 mg Subcutaneous Q24H  . insulin aspart  0-9 Units Subcutaneous TID WC  . insulin glargine  10 Units Subcutaneous QHS  . simvastatin  40 mg Oral Daily   Continuous Infusions: . sodium chloride Stopped (01/23/15 2000)  . dextrose 5 % and 0.45% NaCl Stopped (01/24/15 0430)

## 2015-01-25 NOTE — Progress Notes (Signed)
CBG rechecked, 68, pt asymptomatic, no c/o voiced

## 2015-01-25 NOTE — Progress Notes (Signed)
Inpatient Diabetes Program Recommendations  AACE/ADA: New Consensus Statement on Inpatient Glycemic Control (2013)  Target Ranges:  Prepandial:   less than 140 mg/dL      Peak postprandial:   less than 180 mg/dL (1-2 hours)      Critically ill patients:  140 - 180 mg/dL   Results for Benjamin Riddle, Benjamin Riddle (MRN [id number]) as of 01/25/2015 13:31  Ref. Range 01/24/2015 07:23 01/24/2015 12:02 01/24/2015 17:14 01/24/2015 20:56  Glucose-Capillary Latest Ref Range: 65-99 mg/dL 409143 (H) 811265 (H) 914196 (H) 163 (H)    Results for Benjamin Riddle, Benjamin Riddle (MRN [id number]) as of 01/25/2015 13:31  Ref. Range 01/25/2015 07:55 01/25/2015 08:23 01/25/2015 12:05  Glucose-Capillary Latest Ref Range: 65-99 mg/dL 43 (LL) 68 086247 (H)    Admit with: DKA  History: DM  Home DM Meds: Toujeo 14 units QHS  Humalog- 8 units breakfast/ 4 units lunch/ 8 units dinner   Humalog 1 unit for every 50 mg/dl above target CBG   Current DM Orders: Lantus 14 units QHS  Novolog Sensitive SSI (0-9 units) TID AC     -Patient with Asymptomatic Hypoglycemia this AM after receiving 10 units Lantus insulin the night prior.  -Eating 100% of meals.   MD- Please consider the following in-hospital insulin adjustments:  1. Reduce Lantus dose back to 10 units QHS if patient continues to have fasting Hypoglycemia- Patient may have had Hypoglycemia this AM b/c he received Lantus 10 units at 3AM yesterday to transition off IV insulin drip and then received another 10 units Lantus last night at 10PM (doses may have overlapped slightly)  2. Consider adding low dose Novolog Meal Coverage- Novolog 3 units tid with meals     Will follow Ambrose FinlandJeannine Johnston Ariyah Sedlack RN, MSN, CDE Diabetes Coordinator Inpatient Glycemic Control Team Team Pager: [phone] (8a-5p)

## 2015-01-26 DIAGNOSIS — B952 Enterococcus as the cause of diseases classified elsewhere: Secondary | ICD-10-CM

## 2015-01-26 LAB — URINE CULTURE: Culture: >=100000

## 2015-01-26 LAB — GLUCOSE, CAPILLARY
Glucose-Capillary: 53 mg/dL — ABNORMAL LOW (ref 65–99)
Glucose-Capillary: 113 mg/dL — ABNORMAL HIGH (ref 65–99)

## 2015-01-26 MED ORDER — FLUCONAZOLE 50 MG PO TABS: 50.0000 mg | ORAL_TABLET | Freq: Every day | ORAL | Status: DC

## 2015-01-26 MED ORDER — SACCHAROMYCES BOULARDII 250 MG PO CAPS: 250.0000 mg | ORAL_CAPSULE | Freq: Two times a day (BID) | ORAL | Status: DC

## 2015-01-26 MED ORDER — AMOXICILLIN 250 MG PO CAPS
500.0000 mg | ORAL_CAPSULE | Freq: Two times a day (BID) | ORAL | Status: DC
  Administered 2015-01-26: 500 mg via ORAL
  Filled 2015-01-26: qty 2

## 2015-01-26 MED ORDER — FLUCONAZOLE 100MG IVPB
50.0000 mg | Freq: Every day | INTRAVENOUS | Status: DC
  Filled 2015-01-26: qty 25

## 2015-01-26 MED ORDER — AMOXICILLIN 500 MG PO CAPS: 500.0000 mg | ORAL_CAPSULE | Freq: Two times a day (BID) | ORAL | Status: DC

## 2015-01-26 MED ORDER — FLUCONAZOLE 100MG IVPB
50.0000 mg | Freq: Every day | INTRAVENOUS | Status: DC
  Administered 2015-01-26: 50 mg via INTRAVENOUS
  Filled 2015-01-26: qty 25

## 2015-01-26 NOTE — Progress Notes (Signed)
Hypoglycemic Event  CBG: 53  Treatment: 15 GM carbohydrate snack  Symptoms: None  Follow-up CBG: [id number] CBG Result:113  Possible Reasons for Event: Unknown  Comments/MD notified:N/A    Kavian Peters Maureen RalphsCesar M  Remember to initiate Hypoglycemia Order Set & complete

## 2015-01-26 NOTE — Discharge Instructions (Signed)

## 2015-01-26 NOTE — Progress Notes (Signed)
Went over discharge summary with pt and wife.  All questions answered.  Prescriptions and AVS given.  Explained importance of taking medications as prescriped.  Unable to make follow up appointment- office closed on Saturday.  Pt wheeled out by NT.

## 2015-01-26 NOTE — Discharge Summary (Signed)
Physician Discharge Summary  Benjamin Riddle NMM:[id number] DOB: 1928-06-07 DOA: 01/23/2015  PCP: Jerlyn Ly, MD  Admit date: 01/23/2015 Discharge date: 01/26/2015  Recommendations for Outpatient Follow-up:  1. Continue diflucan 50 mg a day for 6 days on discharge for yeast UTI 2. Continue amoxicillin for 10 days on discharge for Enterococcus UTI  Discharge Diagnoses:  Principal Problem:   DKA (diabetic ketoacidoses) Active Problems:   Nausea vomiting and diarrhea   HLD (hyperlipidemia)   Asthmatic bronchitis , chronic   COPD GOLD III    Combined systolic and diastolic congestive heart failure   Malnutrition of moderate degree   CKD (chronic kidney disease), stage IV   Protein-calorie malnutrition, severe    Discharge Condition: stable   Diet recommendation: as tolerated   History of present illness:  81 year old male with past medical history of diabetes, CKD stage 4, dyslipidemia, recent hospitalization for UTI (was discharge home with few more days of bactrim). He presented back to [facility] ED with nausea, non bloody vomiting and diarrhea for past few days prior to this admission. Pt reproted poor po intake and associated fatigue. N  In ED, pt was hemodynamically stable. Blood work revealed glucose of 720, CBG more than 600, ketones of 40 in urine, elevated AG at 19, CO2 of 13. He was started on insulin drip. His UA also showed large leukocytes with TNTC WBC. Empiric rocephin started. Admitted to SDU due to DKA. Transferred out of SDU 01/24/2015.  Hospital Course:    Assessment/Plan:    Principal Problem:  DKA (diabetic ketoacidoses) - DKA criteria met on admission with presence of ketones in the urine as well as glucose more than 1000 on urinalysis. BMP revealed AG of 19, CO2 13, glucose of 720. - Patient was on insulin drip at the time of the admission per DKA protocol. He was in the stepdown unit and transferred to the floor 01/24/2015. - Diabetic coordinator has seen the  patient in consultation. Appreciate their recommendations. - Pt will continue insulin regimen as per prior to this admission.  Active Problems: HLD (hyperlipidemia) - Continue simvastatin 40 mg daily  Hyperkalemia - Likely from DKA - Normalized with insulin.  Asthmatic bronchitis , chronic / COPD GOLD III  - Stable - Not in acute exacerbation - Continue inhaler/neb tx as per prior to this admission   Urinary tract infection due to yeawst adn enterococcus / leukocytosis / nausea, vomiting and diarrhea  - Pt just recently admitted for staph coag neg UTI and was d/c'ed with bactrim - Patient's diarrhea completely resolved. GI pathogen panel is pending but C. difficile was not collected because patient did not have diarrhea anymore. - Urinalysis on admission revealed leukocytes and white blood cells. His urine culture is growing yeast and enterococcus species - Started first dose diflucan here and amoxicillin. - He will continue diflucan for 6 days and amoxicillin for 10 days on discharge   Chronic kidney disease, stage 4 - On recent admission creatinine around 2.4 and on this admission 2.56 - Probably secondary to Bactrim that he recently took for UTI. - Creatinine improved with fluids.   Diabetes mellitus type 2, uncontrolled with renal manifestations - On recent admission in early 12/2014 A1c around 9 indicating less than goal glycemic control - A1c on this admission is 9.8. Pt does not want change in medications at this time, he is a retired Engineer, drilling and says he monitors his CBG's regularly - He will continue insulin regimen as per prior to this admission  History of chronic combined systolic and diastolic congestive heart failure - Last 2-D echocardiogram in February 2013 showed ejection fraction of 45-50% and grade 1 diastolic dysfunction. - Compensated  Severe protein calorie malnutrition - Nutrition consulted  DVT prophylaxis:  - Lovenox subQ ordered while patient in  hospital   Code Status: Full.  Family Communication: plan of care discussed with the patient   IV access:  Peripheral IV  Procedures and diagnostic studies:   No results found.  Medical Consultants:  None   Other Consultants:  Physical therapy  IAnti-Infectives:   Rocephin 01/23/2015 --> 01/26/2015     Signed:  Leisa Lenz, MD  [facility] 01/26/2015, 8:28 AM  Pager #: [phone]  Time spent in minutes: more than 30 minutes   Discharge Exam: Filed Vitals:   01/26/15 0428  BP: 146/87  Pulse: 79  Temp: 98.3 F (36.8 C)  Resp: 18   Filed Vitals:   01/25/15 1426 01/25/15 1500 01/25/15 2058 01/26/15 0428  BP: 149/88  156/76 146/87  Pulse: 68  68 79  Temp: 98.9 F (37.2 C)  98.6 F (37 C) 98.3 F (36.8 C)  TempSrc: Oral  Oral Oral  Resp: _0 Height:      Weight:  61.871 kg (136 lb 6.4 oz)  61.825 kg (136 lb 4.8 oz)  SpO2: 98%  99% 99%    General: Pt is alert, follows commands appropriately, not in acute distress Cardiovascular: Regular rate and rhythm, S1/S2 appreciated  Respiratory: Clear to auscultation bilaterally, no wheezing, no crackles, no rhonchi Abdominal: Soft, non tender, non distended, bowel sounds +, no guarding Extremities: no edema, no cyanosis, pulses palpable bilaterally DP and PT Neuro: Grossly nonfocal  Discharge Instructions  Discharge Instructions    Call MD for:  difficulty breathing, headache or visual disturbances    Complete by:  As directed      Call MD for:  persistant nausea and vomiting    Complete by:  As directed      Call MD for:  severe uncontrolled pain    Complete by:  As directed      Diet - low sodium heart healthy    Complete by:  As directed      Discharge instructions    Complete by:  As directed   1. Continue diflucan 50 mg a day for 6 days on discharge for yeast UTI 2. Continue amoxicillin for 10 days on discharge for Enterococcus UTI     Increase activity slowly     Complete by:  As directed             Medication List    TAKE these medications        albuterol (2.5 MG/3ML) 0.083% NEBU 3 mL, albuterol (5 MG/ML) 0.5% NEBU 0.5 mL  Inhale 5 mg into the lungs every 6 (six) hours as needed (wheezing).     albuterol 90 MCG/ACT inhaler  Commonly known as:  PROVENTIL,VENTOLIN  Inhale 2 puffs into the lungs every 4 (four) hours as needed for wheezing or shortness of breath.     amoxicillin 500 MG capsule  Commonly known as:  AMOXIL  Take 1 capsule (500 mg total) by mouth every 12 (twelve) hours.     cholecalciferol 1000 UNITS tablet  Commonly known as:  VITAMIN D  Take 1,000 Units by mouth daily.     feeding supplement (PRO-STAT 64) Liqd  Take 30 mLs by mouth 3 (three) times daily with meals.  fluconazole 50 MG tablet  Commonly known as:  DIFLUCAN  Take 1 tablet (50 mg total) by mouth daily.     insulin lispro 100 UNIT/ML injection  Commonly known as:  HUMALOG  Inject 4-16 Units into the skin 3 (three) times daily as needed for high blood sugar (high blood sugar). Adjusted to blood glucose level. Patient adds an additional unit for every 34m/dL     mometasone-formoterol 100-5 MCG/ACT Aero  Commonly known as:  DULERA  Take 2 puffs first thing in am and then another 2 puffs about 12 hours later.     saccharomyces boulardii 250 MG capsule  Commonly known as:  FLORASTOR  Take 1 capsule (250 mg total) by mouth 2 (two) times daily.     simvastatin 40 MG tablet  Commonly known as:  ZOCOR  Take 40 mg by mouth daily.     Tiotropium Bromide Monohydrate 2.5 MCG/ACT Aers  Commonly known as:  SPIRIVA RESPIMAT  2 puffs each am     TOUJEO SOLOSTAR 300 UNIT/ML Sopn  Generic drug:  Insulin Glargine  Inject 14 Units into the skin at bedtime.     UNABLE TO FIND  Narrow rolling walker with 5 inch wheels. Diagnosis: Generalized weakness and functional limitations.     zolpidem 5 MG tablet  Commonly known as:  AMBIEN  Take 5 mg by mouth daily  as needed for sleep (sleep).           Follow-up Information    Follow up with PERINI,MARK A, MD. Schedule an appointment as soon as possible for a visit in 1 week.   Specialty:  Internal Medicine   Why:  Follow up appt after recent hospitalization   Contact information:   [place] [phone]       The results of significant diagnostics from this hospitalization (including imaging, microbiology, ancillary and laboratory) are listed below for reference.    Significant Diagnostic Studies: Ct Abdomen Pelvis Wo Contrast  01/04/2015   CLINICAL DATA:  81year old male with nausea vomiting weakness abdominal pain and weight loss. Current history of hepatitis C and chronic kidney disease. Initial encounter.  EXAM: CT ABDOMEN AND PELVIS WITHOUT CONTRAST  TECHNIQUE: Multidetector CT imaging of the abdomen and pelvis was performed following the standard protocol without IV contrast.  COMPARISON:  Renal ultrasound 07/01/2010.  Chest CT 05/31/2003  FINDINGS: Mild respiratory motion artifact at the lung bases. No pericardial or pleural effusion. No confluent lung base opacity.  Degenerative changes throughout the spine. No acute osseous abnormality identified.  Small right inguinal hernia, predominantly fat containing. This does not appear to contain bowel. Evidence of previous left inguinal hernia repair. Up to 4 cm right posterior bladder urinary bladder diverticulum. Small are 2 cm left posterior bladder diverticulum versus distal hydroureter. Largely decompressed bladder, with moderate bladder wall thickening. No perivesical stranding.  Gas in the rectum. Decompressed sigmoid colon. Redundant sigmoid. No distal colon inflammation. Negative left colon and splenic flexure aside from retained stool. Negative transverse colon and right colon aside from retained stool. Negative terminal ileum. Appendix not identified. No pericecal inflammation. No dilated small bowel. Oral  contrast administered but has not yet reached the distal small bowel. Decompressed stomach and duodenum.  Noncontrast liver, gallbladder, spleen, and adrenal glands are within normal limits. Pancreatic atrophy. No abdominal free fluid.  Extensive Aortoiliac calcified atherosclerosis noted.  Abnormal kidneys and renal collecting systems. Bilateral renal cortical atrophy with lobulation. Dilated right renal pelvis with mild  urothelial thickening (series 2, image 39). No adjacent inflammatory stranding. Rapid tapering of the right ureteral pelvic junction, although there is right hydroureter throughout much of its course, with mild urothelial thickening but no periureteral stranding. Smaller left renal pelvis dilatation. Left hydroureter and urothelial thickening similar to that on the right. No urologic calculus identified.  No lymphadenopathy identified.  No free air.  IMPRESSION: 1. No definite acute process identified in the abdomen or pelvis. 2. Chronic renal cortical atrophy and chronic/recurrent urinary tract inflammation suspected, with hydroureter an generalized urothelial thickening without associated inflammatory stranding. Associated urinary bladder wall thickening, bladder diverticula versus distal hydroureter. No urologic calculus identified. 3. Pancreatic atrophy.   Electronically Signed   By: Genevie Ann M.D.   On: 01/04/2015 19:47   Dg Chest 2 View  01/04/2015   CLINICAL DATA:  Weakness for 1 week, shortness of breath, cough on and off for 2 weeks.  EXAM: CHEST  2 VIEW  COMPARISON:  11/29/2014  FINDINGS: The heart size and mediastinal contours are within normal limits. Both lungs are clear. The visualized skeletal structures are unremarkable.  IMPRESSION: No active cardiopulmonary disease.   Electronically Signed   By: Kathreen Devoid   On: 01/04/2015 17:16    Microbiology: Recent Results (from the past 240 hour(s))  Urine culture     Status: None   Collection Time: 01/23/15  2:43 PM  Result Value  Ref Range Status   Specimen Description URINE, CLEAN CATCH  Final   Special Requests NONE  Final   Culture   Final    >=100,000 COLONIES/mL ENTEROCOCCUS SPECIES 60,000 COLONIES/ml YEAST Performed at [facility]    Report Status 01/26/2015 FINAL  Final   Organism ID, Bacteria ENTEROCOCCUS SPECIES  Final      Susceptibility   Enterococcus species - MIC*    AMPICILLIN <=2 SENSITIVE Sensitive     LEVOFLOXACIN 1 SENSITIVE Sensitive     NITROFURANTOIN <=16 SENSITIVE Sensitive     VANCOMYCIN <=0.5 SENSITIVE Sensitive     LINEZOLID 2 SENSITIVE Sensitive     * >=100,000 COLONIES/mL ENTEROCOCCUS SPECIES  MRSA PCR Screening     Status: None   Collection Time: 01/23/15  5:04 PM  Result Value Ref Range Status   MRSA by PCR NEGATIVE NEGATIVE Final    Comment:        The GeneXpert MRSA Assay (FDA approved for NASAL specimens only), is one component of a comprehensive MRSA colonization surveillance program. It is not intended to diagnose MRSA infection nor to guide or monitor treatment for MRSA infections.      Labs: Basic Metabolic Panel:  Recent Labs Lab 01/23/15 1653 01/23/15 2110 01/24/15 0018 01/24/15 0540 01/25/15 0904  NA 136 136 139 136 138  K 4.9 4.4 4.1 4.3 3.9  CL 105 109 110 108 106  CO2 20* 20* 24 20* 25  GLUCOSE 474* 222* 108* 173* 136*  BUN 50* 44* 40* 36* 24*  CREATININE 2.07* 1.94* 1.73* 1.75* 1.52*  CALCIUM 8.1* 7.9* 8.0* 7.9* 8.6*  MG 2.1  --   --   --   --   PHOS 3.3  --   --   --   --    Liver Function Tests: No results for input(s): AST, ALT, ALKPHOS, BILITOT, PROT, ALBUMIN in the last 168 hours. No results for input(s): LIPASE, AMYLASE in the last 168 hours. No results for input(s): AMMONIA in the last 168 hours. CBC:  Recent Labs Lab 01/23/15 1400 01/23/15 1653  WBC 14.2* 13.2*  NEUTROABS 12.7*  --   HGB 13.4 12.0*  HCT 42.2 37.0*  MCV 96.1 92.3  PLT 370 354   Cardiac Enzymes: No results for input(s): CKTOTAL, CKMB, CKMBINDEX,  TROPONINI in the last 168 hours. BNP: BNP (last 3 results) No results for input(s): BNP in the last 8760 hours.  ProBNP (last 3 results) No results for input(s): PROBNP in the last 8760 hours.  CBG:  Recent Labs Lab 01/25/15 1205 01/25/15 1650 01/25/15 2056 01/26/15 0610 01/26/15 0647  GLUCAP 247* 343* 112* 53* 113*

## 2015-01-28 DIAGNOSIS — Z961 Presence of intraocular lens: Secondary | ICD-10-CM | POA: Diagnosis not present

## 2015-01-28 DIAGNOSIS — E109 Type 1 diabetes mellitus without complications: Secondary | ICD-10-CM | POA: Diagnosis not present

## 2015-02-05 DIAGNOSIS — I700 Atherosclerosis of aorta: Secondary | ICD-10-CM | POA: Diagnosis not present

## 2015-02-05 DIAGNOSIS — Z681 Body mass index (BMI) 19 or less, adult: Secondary | ICD-10-CM | POA: Diagnosis not present

## 2015-02-05 DIAGNOSIS — N183 Chronic kidney disease, stage 3 (moderate): Secondary | ICD-10-CM | POA: Diagnosis not present

## 2015-02-05 DIAGNOSIS — I10 Essential (primary) hypertension: Secondary | ICD-10-CM | POA: Diagnosis not present

## 2015-02-05 DIAGNOSIS — E109 Type 1 diabetes mellitus without complications: Secondary | ICD-10-CM | POA: Diagnosis not present

## 2015-02-05 DIAGNOSIS — E46 Unspecified protein-calorie malnutrition: Secondary | ICD-10-CM | POA: Diagnosis not present

## 2015-02-05 DIAGNOSIS — J449 Chronic obstructive pulmonary disease, unspecified: Secondary | ICD-10-CM | POA: Diagnosis not present

## 2015-02-05 DIAGNOSIS — N390 Urinary tract infection, site not specified: Secondary | ICD-10-CM | POA: Diagnosis not present

## 2015-02-06 DIAGNOSIS — I10 Essential (primary) hypertension: Secondary | ICD-10-CM | POA: Diagnosis not present

## 2015-02-06 DIAGNOSIS — N319 Neuromuscular dysfunction of bladder, unspecified: Secondary | ICD-10-CM | POA: Diagnosis not present

## 2015-02-06 DIAGNOSIS — E109 Type 1 diabetes mellitus without complications: Secondary | ICD-10-CM | POA: Diagnosis not present

## 2015-02-06 DIAGNOSIS — J449 Chronic obstructive pulmonary disease, unspecified: Secondary | ICD-10-CM | POA: Diagnosis not present

## 2015-02-06 DIAGNOSIS — E875 Hyperkalemia: Secondary | ICD-10-CM | POA: Diagnosis not present

## 2015-02-06 DIAGNOSIS — M810 Age-related osteoporosis without current pathological fracture: Secondary | ICD-10-CM | POA: Diagnosis not present

## 2015-02-06 DIAGNOSIS — N390 Urinary tract infection, site not specified: Secondary | ICD-10-CM | POA: Diagnosis not present

## 2015-02-06 DIAGNOSIS — R269 Unspecified abnormalities of gait and mobility: Secondary | ICD-10-CM | POA: Diagnosis not present

## 2015-03-05 DIAGNOSIS — N390 Urinary tract infection, site not specified: Secondary | ICD-10-CM | POA: Diagnosis not present

## 2015-03-05 DIAGNOSIS — R8299 Other abnormal findings in urine: Secondary | ICD-10-CM | POA: Diagnosis not present

## 2015-03-11 DIAGNOSIS — E875 Hyperkalemia: Secondary | ICD-10-CM | POA: Diagnosis not present

## 2015-03-11 DIAGNOSIS — N390 Urinary tract infection, site not specified: Secondary | ICD-10-CM | POA: Diagnosis not present

## 2015-03-11 DIAGNOSIS — R8299 Other abnormal findings in urine: Secondary | ICD-10-CM | POA: Diagnosis not present

## 2015-03-19 DIAGNOSIS — R8299 Other abnormal findings in urine: Secondary | ICD-10-CM | POA: Diagnosis not present

## 2015-03-19 DIAGNOSIS — N390 Urinary tract infection, site not specified: Secondary | ICD-10-CM | POA: Diagnosis not present

## 2015-03-22 ENCOUNTER — Ambulatory Visit (HOSPITAL_COMMUNITY)
Admission: RE | Admit: 2015-03-22 | Discharge: 2015-03-22 | Disposition: A | Discharge status: Home / Self Care | Payer: Medicare Other | Source: Ambulatory Visit | Attending: Internal Medicine | Admitting: Internal Medicine

## 2015-03-22 ENCOUNTER — Other Ambulatory Visit (HOSPITAL_COMMUNITY): Payer: Self-pay | Admitting: Internal Medicine

## 2015-03-22 ENCOUNTER — Encounter (HOSPITAL_COMMUNITY): Payer: Self-pay

## 2015-03-22 DIAGNOSIS — M810 Age-related osteoporosis without current pathological fracture: Secondary | ICD-10-CM | POA: Diagnosis not present

## 2015-03-22 MED ORDER — DENOSUMAB 60 MG/ML ~~LOC~~ SOLN
60.0000 mg | Freq: Once | SUBCUTANEOUS | Status: AC
  Administered 2015-03-22: 60 mg via SUBCUTANEOUS
  Filled 2015-03-22: qty 1

## 2015-03-22 NOTE — BH Specialist Note (Signed)
Pt states he takes daily vitamin D, but does not take a calcium supplement, calcium comes from his diet.  Pt's calcium level is 9.2, within normal range.  prolia instructions given to pt.

## 2015-03-22 NOTE — Discharge Instructions (Signed)
Denosumab injection What is this medicine? DENOSUMAB (den oh sue mab) slows bone breakdown. Prolia is used to treat osteoporosis in women after menopause and in men. Xgeva is used to prevent bone fractures and other bone problems caused by cancer bone metastases. Xgeva is also used to treat giant cell tumor of the bone. This medicine may be used for other purposes; ask your health care provider or pharmacist if you have questions. COMMON BRAND NAME(S): Prolia, XGEVA What should I tell my health care provider before I take this medicine? They need to know if you have any of these conditions: -dental disease -eczema -infection or history of infections -kidney disease or on dialysis -low blood calcium or vitamin D -malabsorption syndrome -scheduled to have surgery or tooth extraction -taking medicine that contains denosumab -thyroid or parathyroid disease -an unusual reaction to denosumab, other medicines, foods, dyes, or preservatives -pregnant or trying to get pregnant -breast-feeding How should I use this medicine? This medicine is for injection under the skin. It is given by a health care professional in a hospital or clinic setting. If you are getting Prolia, a special MedGuide will be given to you by the pharmacist with each prescription and refill. Be sure to read this information carefully each time. For Prolia, talk to your pediatrician regarding the use of this medicine in children. Special care may be needed. For Xgeva, talk to your pediatrician regarding the use of this medicine in children. While this drug may be prescribed for children as young as 13 years for selected conditions, precautions do apply. Overdosage: If you think you've taken too much of this medicine contact a poison control center or emergency room at once. Overdosage: If you think you have taken too much of this medicine contact a poison control center or emergency room at once. NOTE: This medicine is only for  you. Do not share this medicine with others. What if I miss a dose? It is important not to miss your dose. Call your doctor or health care professional if you are unable to keep an appointment. What may interact with this medicine? Do not take this medicine with any of the following medications: -other medicines containing denosumab This medicine may also interact with the following medications: -medicines that suppress the immune system -medicines that treat cancer -steroid medicines like prednisone or cortisone This list may not describe all possible interactions. Give your health care provider a list of all the medicines, herbs, non-prescription drugs, or dietary supplements you use. Also tell them if you smoke, drink alcohol, or use illegal drugs. Some items may interact with your medicine. What should I watch for while using this medicine? Visit your doctor or health care professional for regular checks on your progress. Your doctor or health care professional may order blood tests and other tests to see how you are doing. Call your doctor or health care professional if you get a cold or other infection while receiving this medicine. Do not treat yourself. This medicine may decrease your body's ability to fight infection. You should make sure you get enough calcium and vitamin D while you are taking this medicine, unless your doctor tells you not to. Discuss the foods you eat and the vitamins you take with your health care professional. See your dentist regularly. Brush and floss your teeth as directed. Before you have any dental work done, tell your dentist you are receiving this medicine. Do not become pregnant while taking this medicine or for 5 months after stopping   it. Women should inform their doctor if they wish to become pregnant or think they might be pregnant. There is a potential for serious side effects to an unborn child. Talk to your health care professional or pharmacist for more  information. What side effects may I notice from receiving this medicine? Side effects that you should report to your doctor or health care professional as soon as possible: -allergic reactions like skin rash, itching or hives, swelling of the face, lips, or tongue -breathing problems -chest pain -fast, irregular heartbeat -feeling faint or lightheaded, falls -fever, chills, or any other sign of infection -muscle spasms, tightening, or twitches -numbness or tingling -skin blisters or bumps, or is dry, peels, or red -slow healing or unexplained pain in the mouth or jaw -unusual bleeding or bruising Side effects that usually do not require medical attention (Report these to your doctor or health care professional if they continue or are bothersome.): -muscle pain -stomach upset, gas This list may not describe all possible side effects. Call your doctor for medical advice about side effects. You may report side effects to FDA at 1-800-FDA-1088. Where should I keep my medicine? This medicine is only given in a clinic, doctor's office, or other health care setting and will not be stored at home. NOTE: This sheet is a summary. It may not cover all possible information. If you have questions about this medicine, talk to your doctor, pharmacist, or health care provider.  2015, [employer]/Gold Standard. (2012-01-04 12:37:47)  

## 2015-03-26 DIAGNOSIS — N390 Urinary tract infection, site not specified: Secondary | ICD-10-CM | POA: Diagnosis not present

## 2015-03-26 DIAGNOSIS — R8299 Other abnormal findings in urine: Secondary | ICD-10-CM | POA: Diagnosis not present

## 2015-03-27 ENCOUNTER — Ambulatory Visit (INDEPENDENT_AMBULATORY_CARE_PROVIDER_SITE_OTHER): Payer: Medicare Other | Admitting: Internal Medicine

## 2015-03-27 ENCOUNTER — Encounter: Payer: Self-pay | Admitting: Internal Medicine

## 2015-03-27 DIAGNOSIS — J449 Chronic obstructive pulmonary disease, unspecified: Secondary | ICD-10-CM

## 2015-03-27 MED ORDER — TIOTROPIUM BROMIDE-OLODATEROL 2.5-2.5 MCG/ACT IN AERS: 2.0000 | INHALATION_SPRAY | Freq: Every day | RESPIRATORY_TRACT | Status: DC

## 2015-03-27 NOTE — Progress Notes (Signed)
Subjective:    Patient ID: Benjamin Riddle, male    DOB: 1927/10/10    MRN: [id number]     Brief patient profile:  81 yom  Retired occupational physician cigar smoker until 2013 with croup age 81 and  with childhood allergies in [place] never really outgrew with fall issues with sinus infections/sneezing s/p sinus surgery in 1980's dx as eosinophil pna by Dr Sung Amabile around early 2000's referred to pulmonary clinic 08/31/2014 by Dr Waynard Edwards for sob with pfts c/w gold III copd 11/23/2014    History of Present Illness  08/31/2014 1st [facility] office visit/ Benjamin Riddle  / no better on advair/ spiriva Chief Complaint  Patient presents with  . Pulmonary Consult    Referred by Dr. Waynard Edwards. Pt c/o SOB for the past 2-3 months. He also c/o prod cough with yellow sputum. He states that he gets SOB with or without exertion.   w/in a year of stopping pipe/cigars noted intermittent sob dx as asthmatic bronchitis rx advair/spiriva / best result in past = rx with prednisone but only maybe 50% back to previous baseline   Uses albuterol neb only 50% better for up to a few hours. Assoc Cough is day > noct, minimally productive  rec Stop advair and stay off spiriva Start dulera 100 Take 2 puffs first thing in am and then another 2 puffs about 12 hours later.  Only use your albuterol nebulizer  As needed Prednisone 10 mg take  4 each am x 2 days,   2 each am x 2 days,  1 each am x 2 days and stop  Pantoprazole (protonix) 40 mg   Take 30-60 min before first meal of the day and Pepcid 20 mg one bedtime until return to office - this is the best way to tell whether stomach acid is contributing to your problem.   GERD  Diet       09/26/2014 f/u ov/Benjamin Riddle re: cough > sob Chief Complaint  Patient presents with  . Follow-up    Pt states his breathing has improved some. Cough is no better. No new co's today.     Not able to take ppi/ not consistent with dulera 100 but no longer needing any saba  Cough day > noct / variably  prod yellow mucus  Could not take ppi due to diarrhea, stopped pepcid also  rec Prednisone 10 mg take  4 each am x 2 days,   2 each am x 2 days,  1 each am x 2 days and stop  For cough mucinex dm up to 1200 mg every 12 hours best otc available Continue dulera 100 Take 2 puffs first thing in am and then another 2 puffs about 12 hours later   11/23/2014 f/u ov/Benjamin Riddle re: GOLD II copd  Chief Complaint  Patient presents with  . Follow-up    PFT done today.  Pt states his breathing is unchanged since last visit. Cough is some better. No new co's today. Has not needed albuterol inhaler or neb.   walking around neighborhood doe x 5 min /worse with inclines but nl pace = MMRC 1  rec Work on inhaler technique:  relax and gently blow all the way out then take a nice smooth deep breath back in, triggering the inhaler at same time you start breathing in.  Hold for up to 5 seconds if you can and out through the nose.  Rinse and gargle with water when done Add spiriva 2 puffs each am after  dulera and fill the rx if you note better activity tolerance/ breathing with exertion   03/27/2015 f/u ov/Benjamin Riddle re: copd GOLD II/ worse doe  Chief Complaint  Patient presents with  . Follow-up    Pt states his breathing is unchanged. He has noticed increased cough for the past few days- non prod. He also has had occ wheezing.   doe cannot do nl pace  = MMRC3 = can't walk 100 yards even at a slow pace at a flat grade s stopping due to sob  Rarely needing saba.  No obvious day to day or daytime variabilty or assoc cp or chest tightness, subjective wheeze overt sinus or hb symptoms. No unusual exp hx or h/o childhood pna/ asthma or knowledge of premature birth.  Sleeping ok without nocturnal  or early am exacerbation  of respiratory  c/o's or need for noct saba. Also denies any obvious fluctuation of symptoms with weather or environmental changes or other aggravating or alleviating factors except as outlined above   Current  Medications, Allergies, Complete Past Medical History, Past Surgical History, Family History, and Social History were reviewed in [employer] record.  ROS  The following are not active complaints unless bolded sore throat, dysphagia, dental problems, itching, sneezing,  nasal congestion or excess/ purulent secretions, ear ache,   fever, chills, sweats, unintended wt loss, pleuritic or exertional cp, hemoptysis,  orthopnea pnd or leg swelling, presyncope, palpitations, heartburn, abdominal pain, anorexia, nausea, vomiting, diarrhea  or change in bowel or urinary habits, change in stools or urine, dysuria,hematuria,  rash, arthralgias, visual complaints, headache, numbness weakness or ataxia or problems with walking or coordination,  change in mood/affect or memory.          Objective:   Physical Exam  amb  moderately hoarse male nad  09/27/14            143  > 11/23/2014 146  >   03/27/2015  132  Wt Readings from Last 3 Encounters:  08/31/14 143 lb 6.4 oz (65.046 kg)  11/23/13 149 lb (67.586 kg)  09/14/11 141 lb 6.4 oz (64.139 kg)    Vital signs reviewed   HEENT: nl dentition, turbinates, and orophanx. Nl external ear canals without cough reflex   NECK :  without JVD/Nodes/TM/ nl carotid upstrokes bilaterally   LUNGS: no acc muscle use,  Mild insp and exp rhonchi bilaterally      CV:  RRR  no s3 or murmur or increase in P2, no edema   ABD:  soft and nontender with nl excursion in the supine position. No bruits or organomegaly, bowel sounds nl  MS:  warm without deformities, calf tenderness, cyanosis or clubbing  SKIN: warm and dry without lesions    NEURO:  alert, approp, no deficits     CXR PA and Lateral:   09/26/2014 :     I personally reviewed images and agree with radiology impression as follows:    Hyper aeration. Peribronchial thickening may indicate bronchitis. No definite pneumonia or effusion.     Assessment & Plan:

## 2015-03-27 NOTE — Patient Instructions (Addendum)
Try off dulera and spiriva and just the stiolto 2 puffs each am   Work on inhaler technique:  relax and gently blow all the way out then take a nice smooth deep breath back in, triggering the inhaler at same time you start breathing in.  Hold for up to 5 seconds if you can. Blow out thru nose. Rinse and gargle with water when done      Only use your albuterol (ventolin) as  a rescue medication to be used if you can't catch your breath by resting or doing a relaxed purse lip breathing pattern.  - The less you use it, the better it will work when you need it. - Ok to use up to 2 puffs  every 4 hours if you must but call for immediate appointment if use goes up over your usual need - Don't leave home without it !!  (think of it like the spare tire for your car)   Please schedule a follow up visit in 3 months but call sooner if needed

## 2015-03-27 NOTE — Assessment & Plan Note (Signed)
PFTs 11/23/2014   FEV1  1.12 (42%) ratio 52 with no resp so saba p am dulera and dlco 50% corrects to 120  - 11/23/2014 p extensive coaching HFA effectiveness =    75% > added spiriva respimat - 03/27/2015 p extensive coaching HFA effectiveness =    50% hfa/ 75% respimat so rec change to stiolto which may make it more effective than "triple Rx" he's presently on but not breathing in effectively and also less adverse effect on upper airway     He has progressed from [facility] 1 to 3 with last HC03 =20 so I strongly suspect his CRI and deconditioning are the cause of his decline in ex tol so probably not a good candidate for pulmonary rehab at this point   I had an extended discussion with the patient reviewing all relevant studies completed to date and  lasting 15 to 20 minutes of a 25 minute visit    Each maintenance medication was reviewed in detail including most importantly the difference between maintenance and prns and under what circumstances the prns are to be triggered using an action plan format that is not reflected in the computer generated alphabetically organized AVS.    Please see instructions for details which were reviewed in writing and the patient given a copy highlighting the part that I personally wrote and discussed at today's ov.

## 2015-04-01 DIAGNOSIS — N3020 Other chronic cystitis without hematuria: Secondary | ICD-10-CM | POA: Diagnosis not present

## 2015-04-01 DIAGNOSIS — R310 Gross hematuria: Secondary | ICD-10-CM | POA: Diagnosis not present

## 2015-04-01 DIAGNOSIS — E780 Pure hypercholesterolemia: Secondary | ICD-10-CM | POA: Diagnosis not present

## 2015-04-01 DIAGNOSIS — E1065 Type 1 diabetes mellitus with hyperglycemia: Secondary | ICD-10-CM | POA: Diagnosis not present

## 2015-04-06 ENCOUNTER — Encounter (HOSPITAL_COMMUNITY): Payer: Self-pay | Admitting: Emergency Medicine

## 2015-04-06 ENCOUNTER — Inpatient Hospital Stay (HOSPITAL_COMMUNITY)
Admission: EM | Admit: 2015-04-06 | Discharge: 2015-04-08 | DRG: 637 | Disposition: A | Discharge status: Home Health | Payer: Medicare Other | Attending: Internal Medicine | Admitting: Internal Medicine

## 2015-04-06 DIAGNOSIS — Z825 Family history of asthma and other chronic lower respiratory diseases: Secondary | ICD-10-CM | POA: Diagnosis not present

## 2015-04-06 DIAGNOSIS — N179 Acute kidney failure, unspecified: Secondary | ICD-10-CM | POA: Diagnosis present

## 2015-04-06 DIAGNOSIS — M810 Age-related osteoporosis without current pathological fracture: Secondary | ICD-10-CM | POA: Diagnosis present

## 2015-04-06 DIAGNOSIS — I129 Hypertensive chronic kidney disease with stage 1 through stage 4 chronic kidney disease, or unspecified chronic kidney disease: Secondary | ICD-10-CM | POA: Diagnosis present

## 2015-04-06 DIAGNOSIS — R739 Hyperglycemia, unspecified: Secondary | ICD-10-CM | POA: Diagnosis present

## 2015-04-06 DIAGNOSIS — E871 Hypo-osmolality and hyponatremia: Secondary | ICD-10-CM | POA: Diagnosis present

## 2015-04-06 DIAGNOSIS — N184 Chronic kidney disease, stage 4 (severe): Secondary | ICD-10-CM | POA: Diagnosis not present

## 2015-04-06 DIAGNOSIS — E1310 Other specified diabetes mellitus with ketoacidosis without coma: Secondary | ICD-10-CM | POA: Diagnosis present

## 2015-04-06 DIAGNOSIS — Z87891 Personal history of nicotine dependence: Secondary | ICD-10-CM | POA: Diagnosis not present

## 2015-04-06 DIAGNOSIS — J449 Chronic obstructive pulmonary disease, unspecified: Secondary | ICD-10-CM | POA: Diagnosis present

## 2015-04-06 DIAGNOSIS — N320 Bladder-neck obstruction: Secondary | ICD-10-CM

## 2015-04-06 DIAGNOSIS — E1010 Type 1 diabetes mellitus with ketoacidosis without coma: Secondary | ICD-10-CM | POA: Diagnosis not present

## 2015-04-06 DIAGNOSIS — I5042 Chronic combined systolic (congestive) and diastolic (congestive) heart failure: Secondary | ICD-10-CM | POA: Diagnosis present

## 2015-04-06 DIAGNOSIS — E1169 Type 2 diabetes mellitus with other specified complication: Secondary | ICD-10-CM | POA: Diagnosis not present

## 2015-04-06 DIAGNOSIS — E43 Unspecified severe protein-calorie malnutrition: Secondary | ICD-10-CM | POA: Diagnosis not present

## 2015-04-06 DIAGNOSIS — E860 Dehydration: Secondary | ICD-10-CM

## 2015-04-06 DIAGNOSIS — E785 Hyperlipidemia, unspecified: Secondary | ICD-10-CM | POA: Diagnosis not present

## 2015-04-06 DIAGNOSIS — N390 Urinary tract infection, site not specified: Secondary | ICD-10-CM | POA: Diagnosis present

## 2015-04-06 DIAGNOSIS — E872 Acidosis: Secondary | ICD-10-CM | POA: Diagnosis not present

## 2015-04-06 DIAGNOSIS — E1110 Type 2 diabetes mellitus with ketoacidosis without coma: Secondary | ICD-10-CM | POA: Diagnosis present

## 2015-04-06 DIAGNOSIS — Z794 Long term (current) use of insulin: Secondary | ICD-10-CM

## 2015-04-06 DIAGNOSIS — N319 Neuromuscular dysfunction of bladder, unspecified: Secondary | ICD-10-CM | POA: Diagnosis present

## 2015-04-06 DIAGNOSIS — Z833 Family history of diabetes mellitus: Secondary | ICD-10-CM | POA: Diagnosis not present

## 2015-04-06 DIAGNOSIS — Z8249 Family history of ischemic heart disease and other diseases of the circulatory system: Secondary | ICD-10-CM

## 2015-04-06 DIAGNOSIS — Z809 Family history of malignant neoplasm, unspecified: Secondary | ICD-10-CM | POA: Diagnosis not present

## 2015-04-06 DIAGNOSIS — R7309 Other abnormal glucose: Secondary | ICD-10-CM | POA: Diagnosis not present

## 2015-04-06 LAB — URINE MICROSCOPIC-ADD ON

## 2015-04-06 LAB — CBC WITH DIFFERENTIAL/PLATELET
Basophils Absolute: 0 10*3/uL (ref 0.0–0.1)
Basophils Relative: 0 %
EOS PCT: 0 %
Eosinophils Absolute: 0 10*3/uL (ref 0.0–0.7)
HEMATOCRIT: 34.4 % — AB (ref 39.0–52.0)
Hemoglobin: 11.2 g/dL — ABNORMAL LOW (ref 13.0–17.0)
Lymphocytes Relative: 8 %
Lymphs Abs: 0.9 10*3/uL (ref 0.7–4.0)
MCH: 29.5 pg (ref 26.0–34.0)
MCHC: 32.6 g/dL (ref 30.0–36.0)
MCV: 90.5 fL (ref 78.0–100.0)
MONOS PCT: 11 %
Monocytes Absolute: 1.3 10*3/uL — ABNORMAL HIGH (ref 0.1–1.0)
NEUTROS ABS: 9.4 10*3/uL — AB (ref 1.7–7.7)
Neutrophils Relative %: 81 %
PLATELETS: 398 10*3/uL (ref 150–400)
RBC: 3.8 MIL/uL — ABNORMAL LOW (ref 4.22–5.81)
RDW: 14.3 % (ref 11.5–15.5)
WBC: 11.7 10*3/uL — ABNORMAL HIGH (ref 4.0–10.5)

## 2015-04-06 LAB — CBG MONITORING, ED
GLUCOSE-CAPILLARY: 343 mg/dL — AB (ref 65–99)
GLUCOSE-CAPILLARY: 425 mg/dL — AB (ref 65–99)
Glucose-Capillary: 471 mg/dL — ABNORMAL HIGH (ref 65–99)
Glucose-Capillary: 391 mg/dL — ABNORMAL HIGH (ref 65–99)
Glucose-Capillary: 395 mg/dL — ABNORMAL HIGH (ref 65–99)

## 2015-04-06 LAB — COMPREHENSIVE METABOLIC PANEL
ALK PHOS: 50 U/L (ref 38–126)
ALT: 13 U/L — AB (ref 17–63)
AST: 15 U/L (ref 15–41)
Albumin: 3.5 g/dL (ref 3.5–5.0)
Anion gap: 10 (ref 5–15)
BILIRUBIN TOTAL: 0.7 mg/dL (ref 0.3–1.2)
BUN: 81 mg/dL — AB (ref 6–20)
CALCIUM: 8.1 mg/dL — AB (ref 8.9–10.3)
CHLORIDE: 104 mmol/L (ref 101–111)
CO2: 17 mmol/L — ABNORMAL LOW (ref 22–32)
CREATININE: 2.65 mg/dL — AB (ref 0.61–1.24)
GFR calc Af Amer: 24 mL/min — ABNORMAL LOW (ref >60)
GFR, EST NON AFRICAN AMERICAN: 21 mL/min — AB (ref >60)
Glucose, Bld: 386 mg/dL — ABNORMAL HIGH (ref 65–99)
Potassium: 5 mmol/L (ref 3.5–5.1)
Sodium: 131 mmol/L — ABNORMAL LOW (ref 135–145)
Total Protein: 6.3 g/dL — ABNORMAL LOW (ref 6.5–8.1)

## 2015-04-06 LAB — BETA-HYDROXYBUTYRIC ACID: Beta-Hydroxybutyric Acid: 1.66 mmol/L — ABNORMAL HIGH (ref 0.05–0.27)

## 2015-04-06 LAB — URINALYSIS, ROUTINE W REFLEX MICROSCOPIC
Bilirubin Urine: NEGATIVE
GLUCOSE, UA: 500 mg/dL — AB
KETONES UR: NEGATIVE mg/dL
Nitrite: NEGATIVE
PH: 6 (ref 5.0–8.0)
Protein, ur: 100 mg/dL — AB
Specific Gravity, Urine: 1.012 (ref 1.005–1.030)
Urobilinogen, UA: 0.2 mg/dL (ref 0.0–1.0)

## 2015-04-06 LAB — LIPASE, BLOOD: LIPASE: 41 U/L (ref 22–51)

## 2015-04-06 MED ORDER — SODIUM CHLORIDE 0.9 % IV SOLN
INTRAVENOUS | Status: DC
  Administered 2015-04-06: 2.8 [IU]/h via INTRAVENOUS
  Administered 2015-04-06: 7.3 [IU]/h via INTRAVENOUS
  Administered 2015-04-06: 10.1 [IU]/h via INTRAVENOUS
  Administered 2015-04-07: 6.4 [IU]/h via INTRAVENOUS
  Administered 2015-04-07: 3.9 [IU]/h via INTRAVENOUS
  Administered 2015-04-07: 1.5 [IU]/h via INTRAVENOUS
  Filled 2015-04-06: qty 2.5

## 2015-04-06 MED ORDER — SODIUM CHLORIDE 0.9 % IV SOLN
Freq: Once | INTRAVENOUS | Status: AC
Start: 2015-04-06 — End: 2015-04-06
  Administered 2015-04-06: 125 mL/h via INTRAVENOUS

## 2015-04-06 MED ORDER — ONDANSETRON HCL 4 MG/2ML IJ SOLN
4.0000 mg | Freq: Once | INTRAMUSCULAR | Status: AC
  Administered 2015-04-06: 4 mg via INTRAVENOUS
  Filled 2015-04-06: qty 2

## 2015-04-06 MED ORDER — SODIUM CHLORIDE 0.9 % IV SOLN
Freq: Once | INTRAVENOUS | Status: AC
  Administered 2015-04-06: 17:00:00 via INTRAVENOUS

## 2015-04-06 MED ORDER — DEXTROSE-NACL 5-0.45 % IV SOLN
INTRAVENOUS | Status: DC
  Administered 2015-04-07: 100 mL/h via INTRAVENOUS

## 2015-04-06 NOTE — ED Notes (Signed)
Pt reported having hyperglycemia episodes x2 days with n/v, thirst, and polyuria.

## 2015-04-06 NOTE — ED Notes (Signed)
Awake. Verbally responsive. A/O x4. Resp even and unlabored. No audible adventitious breath sounds noted. ABC's intact. SR on monitor. IV saline lock patent and intact. Family at bedside. 

## 2015-04-06 NOTE — ED Notes (Signed)
Bed: [facility] Expected date:  Expected time:  Means of arrival:  Comments: Ems- hyperglycemia

## 2015-04-06 NOTE — ED Notes (Signed)
Pt BIB EMS. Pt is from home. Pt c/o n/v/d x 2 days with hyperglycemia. Pt did not take short acting insulin today because he did not eat. Denies pain. Pt has hx of UTI and recently finished abx. A/O x 4. Skin warm, day. No acute distress.

## 2015-04-06 NOTE — ED Notes (Addendum)
EKG handed to Dr. Blinda Leatherwood

## 2015-04-06 NOTE — ED Notes (Signed)
Pt normally self-caths two times a day because he normally cannot void on his own. He doesn't want to have an in & out cath done with the catheters we have here because they dont work for him. Wife said she is going home to get the catheters he normally uses so he can self cath here.

## 2015-04-06 NOTE — ED Notes (Signed)
Bed: [facility]24 Expected date:  Expected time:  Means of arrival:  Comments: Hold for triage 7 

## 2015-04-06 NOTE — H&P (Signed)
[facility]          History and Physical    PCP:   Jerlyn Ly, MD   EDP: Malachy Moan, MD  Chief Complaint:  High sugars  HPI: Patient is a very pleasant 81 year old who looks much younger than his stated age who presented to the hospital today because his sugars have been reading high on his machine for the last 3-4 days. They have been trying to control it by increasing his sliding scale at home, today he also had increased urgency and dysuria and decided to come into the hospital for evaluation. He has a history of insulin-dependent diabetes, chronic kidney disease, neurogenic bladder requiring in and out caths twice a day, history of combined chronic systolic CHF. The ED he has not yet been able to produce a urine sample and his wife has gone him to obtain the special catheters that he uses, however his symptoms are very reminiscent of his prior UTIs, he is afebrile, with stable vital signs. He has acute renal failure, mild hyponatremia and leukocytosis of 11.7. He is acidotic with a CO2 of 17, anion gap is normal at 10. We have been asked to admit him for further evaluation and management.  Allergies:   Allergies  Allergen Reactions  . Codeine Nausea And Vomiting  . Gentamicin     Becomes dizzy and weak  . Ciprofloxacin Itching and Rash      Past Medical History  Diagnosis Date  . Diabetes mellitus   . Asthma   . Hydronephrosis   . Kidney disease     Stage III chronic kidney disease  . Hypertension   . Neurogenic bladder   . Osteoporosis   . Hypogonadism male   . Recurrent urinary tract infection     With resistent Pseudomonas  . Allergic rhinitis   . Hepatitis C     due to blood transfusion  . Hyperlipidemia   . SOB (shortness of breath)   . CKD (chronic kidney disease), stage III   . Combined systolic and diastolic congestive heart failure   . COPD (chronic obstructive pulmonary disease)     Past Surgical History  Procedure  Laterality Date  . Hernia repair    . Tonsillectomy    . Bladder repair      obstruction surgery  . Frontal sinus obliteration      sinus repaired with a plate  . Cataract extraction  2015    Prior to Admission medications   Medication Sig Start Date End Date Taking? Authorizing Provider  albuterol (2.5 MG/3ML) 0.083% NEBU 3 mL, albuterol (5 MG/ML) 0.5% NEBU 0.5 mL Inhale 5 mg into the lungs every 6 (six) hours as needed (wheezing).   Yes Historical Provider, MD  albuterol (PROVENTIL,VENTOLIN) 90 MCG/ACT inhaler Inhale 2 puffs into the lungs every 4 (four) hours as needed for wheezing or shortness of breath.    Yes Historical Provider, MD  cholecalciferol (VITAMIN D) 1000 UNITS tablet Take 1,000 Units by mouth daily.   Yes Historical Provider, MD  Insulin Glargine (TOUJEO SOLOSTAR) 300 UNIT/ML SOPN Inject 14 Units into the skin at bedtime.   Yes Historical Provider, MD  insulin lispro (HUMALOG) 100 UNIT/ML injection Inject 4-16 Units into the skin 3 (three) times daily as needed for high blood sugar (high blood sugar). Injects 8 units at breakfast, 4 units lunch, 8 to 10 units at dinner  Adjusted to blood glucose  level. Patient adds an additional unit for every 22m/dL   Yes Historical Provider, MD  Meth-Hyo-M Bl-Na Phos-Ph Sal (URIBEL) 118 MG CAPS Take 1 capsule by mouth 2 (two) times daily.   Yes Historical Provider, MD  simvastatin (ZOCOR) 40 MG tablet Take 40 mg by mouth daily.     Yes Historical Provider, MD  Tiotropium Bromide-Olodaterol (STIOLTO RESPIMAT) 2.5-2.5 MCG/ACT AERS Inhale 2 puffs into the lungs daily. Patient taking differently: Inhale 1 puff into the lungs daily as needed (for shortness of breath).  03/27/15  Yes MTanda Rockers MD  zolpidem (AMBIEN) 5 MG tablet Take 2.5 mg by mouth daily as needed for sleep (sleep).  10/14/14  Yes Historical Provider, MD  saccharomyces boulardii (FLORASTOR) 250 MG capsule Take 1 capsule (250 mg total) by mouth 2 (two) times daily. Patient not  taking: Reported on 04/06/2015 01/26/15   ARobbie Lis MD  UNABLE TO FIND Narrow rolling walker with 5 inch wheels. Diagnosis: Generalized weakness and functional limitations. Patient not taking: Reported on 04/06/2015 01/07/15   AModena Jansky MD    Social History:  reports that he quit smoking about 3 years ago. His smoking use included Cigars and Cigarettes. He does not have any smokeless tobacco history on file. He reports that he does not drink alcohol or use illicit drugs.  Family History  Problem Relation Age of Onset  . Heart failure Mother   . Hypertension Mother   . COPD Father     smoked  . Diabetes Father   . Cancer Sister     Breast  . Diabetes Sister   . COPD Brother     smoked    Review of Systems:  Constitutional: Denies fever, chills, diaphoresis, appetite change and fatigue.  HEENT: Denies photophobia, eye pain, redness, hearing loss, ear pain, congestion, sore throat, rhinorrhea, sneezing, mouth sores, trouble swallowing, neck pain, neck stiffness and tinnitus.   Respiratory: Denies SOB, DOE, cough, chest tightness,  and wheezing.   Cardiovascular: Denies chest pain, palpitations and leg swelling.  Gastrointestinal: Denies nausea, vomiting, abdominal pain, diarrhea, constipation, blood in stool and abdominal distention.  Genitourinary: Denies hematuria, flank pain and difficulty urinating.  Endocrine: Denies: hot or cold intolerance, sweats, changes in hair or nails, polyuria, polydipsia. Musculoskeletal: Denies myalgias, back pain, joint swelling, arthralgias and gait problem.  Skin: Denies pallor, rash and wound.  Neurological: Denies dizziness, seizures, syncope, weakness, light-headedness, numbness and headaches.  Hematological: Denies adenopathy. Easy bruising, personal or family bleeding history  Psychiatric/Behavioral: Denies suicidal ideation, mood changes, confusion, nervousness, sleep disturbance and agitation   Physical Exam: Blood pressure 102/54,  pulse 61, temperature 97.8 F (36.6 C), temperature source Oral, resp. rate 18, SpO2 98 %. General: Alert, awake, oriented 3 in no acute distress, cachectic HEENT: Normocephalic, atraumatic, pupils equal round and reactive to light, extraocular movements intact, dry mucous membranes. Neck: Supple, no JVD, no lymphadenopathy, no bruits, no goiter.  Cardiovascular: Regular rate and rhythm, no murmurs, rubs or gallops. Lungs: Clear to auscultation bilaterally. Abdomen: Soft, nontender, nondistended, positive bowel sounds, no mass or organomegaly noted. Extremities: No clubbing, cyanosis or edema, positive pulses. Neurologic: Grossly intact and nonfocal, I have not ambulated him.  Labs on Admission:  Results for orders placed or performed during the hospital encounter of 04/06/15 (from the past 48 hour(s))  CBG monitoring, ED     Status: Abnormal   Collection Time: 04/06/15  4:16 PM  Result Value Ref Range   Glucose-Capillary 471 (H) 65 -  99 mg/dL  POC CBG, ED     Status: Abnormal   Collection Time: 04/06/15  5:25 PM  Result Value Ref Range   Glucose-Capillary 391 (H) 65 - 99 mg/dL  CBC with Differential/Platelet     Status: Abnormal   Collection Time: 04/06/15  5:45 PM  Result Value Ref Range   WBC 11.7 (H) 4.0 - 10.5 K/uL   RBC 3.80 (L) 4.22 - 5.81 MIL/uL   Hemoglobin 11.2 (L) 13.0 - 17.0 g/dL   HCT 34.4 (L) 39.0 - 52.0 %   MCV 90.5 78.0 - 100.0 fL   MCH 29.5 26.0 - 34.0 pg   MCHC 32.6 30.0 - 36.0 g/dL   RDW 14.3 11.5 - 15.5 %   Platelets 398 150 - 400 K/uL   Neutrophils Relative % 81 %   Neutro Abs 9.4 (H) 1.7 - 7.7 K/uL   Lymphocytes Relative 8 %   Lymphs Abs 0.9 0.7 - 4.0 K/uL   Monocytes Relative 11 %   Monocytes Absolute 1.3 (H) 0.1 - 1.0 K/uL   Eosinophils Relative 0 %   Eosinophils Absolute 0.0 0.0 - 0.7 K/uL   Basophils Relative 0 %   Basophils Absolute 0.0 0.0 - 0.1 K/uL  Comprehensive metabolic panel     Status: Abnormal   Collection Time: 04/06/15  5:45 PM    Result Value Ref Range   Sodium 131 (L) 135 - 145 mmol/L   Potassium 5.0 3.5 - 5.1 mmol/L   Chloride 104 101 - 111 mmol/L   CO2 17 (L) 22 - 32 mmol/L   Glucose, Bld 386 (H) 65 - 99 mg/dL   BUN 81 (H) 6 - 20 mg/dL   Creatinine, Ser 2.65 (H) 0.61 - 1.24 mg/dL   Calcium 8.1 (L) 8.9 - 10.3 mg/dL   Total Protein 6.3 (L) 6.5 - 8.1 g/dL   Albumin 3.5 3.5 - 5.0 g/dL   AST 15 15 - 41 U/L   ALT 13 (L) 17 - 63 U/L   Alkaline Phosphatase 50 38 - 126 U/L   Total Bilirubin 0.7 0.3 - 1.2 mg/dL   GFR calc non Af Amer 21 (L) >60 mL/min   GFR calc Af Amer 24 (L) >60 mL/min    Comment: (NOTE) The eGFR has been calculated using the CKD EPI equation. This calculation has not been validated in all clinical situations. eGFR's persistently <60 mL/min signify possible Chronic Kidney Disease.    Anion gap 10 5 - 15  Lipase, blood     Status: None   Collection Time: 04/06/15  5:45 PM  Result Value Ref Range   Lipase 41 22 - 51 U/L  Beta-hydroxybutyric acid     Status: Abnormal   Collection Time: 04/06/15  5:45 PM  Result Value Ref Range   Beta-Hydroxybutyric Acid 1.66 (H) 0.05 - 0.27 mmol/L  CBG monitoring, ED     Status: Abnormal   Collection Time: 04/06/15  8:04 PM  Result Value Ref Range   Glucose-Capillary 343 (H) 65 - 99 mg/dL   Comment 1 Notify RN    Comment 2 Document in Chart     Radiological Exams on Admission: No results found.  Assessment/Plan Active Problems:   HLD (hyperlipidemia)   DKA (diabetic ketoacidoses)   CKD (chronic kidney disease), stage IV   Protein-calorie malnutrition, severe   Bladder outlet obstruction   Early DKA -He is acidotic with positive serum ketones, however his anion gap is not elevated. -While in the ED his CBGs have  been in the 500 range. -I believe that this will be difficult to control unless we start him on an IV drip. -We'll admit to stepdown unit and placed on glucose stabilizer protocol. -Check hemoglobin A1c. -Suspect we will be able to  quickly transition him off the drip over the next 8-12 hours and place him back on subcutaneous insulin regimen. -Although his urinalysis is still pending due to lack of proper equipment for his self catheterizations, suspect this is the reason for his DKA.  Presumed UTI -We'll give a dose of Rocephin, await results of urinalysis and culture to determine if further antibiotics are needed.  Acute on chronic kidney disease stage IV -Baseline creatinine appears to be between 1.5 and 1.9. -Creatinine on admission is 2.65. -Has history of bladder outlet obstruction and requires self catheterizations. -Should improve with IV fluids, follow renal function.  Hyperlipidemia -Continue statin.  Severe protein caloric malnutrition -We'll request dietitian evaluation.  DVT prophylaxis -Lovenox.  CODE STATUS -Full code as discussed with patient and wife at bedside     Time Spent on Admission: 85 minutes  HERNANDEZ ACOSTA,ESTELA [facility] Pager: [phone] 04/06/2015, 8:30 PM

## 2015-04-06 NOTE — ED Provider Notes (Signed)
CSN: [id number]     Arrival date & time 04/06/15  1600 History   First MD Initiated Contact with Patient 04/06/15 1606     Chief Complaint  Patient presents with  . N/V/D   . Hyperglycemia     (Consider location/radiation/quality/duration/timing/severity/associated sxs/prior Treatment) HPI Comments: Patient presents to the ER for evaluation of nausea and vomiting with elevated blood sugars. Patient is a diabetic. His blood sugar has been running in the 400-500 range since yesterday. He does not come down with insulin. Patient has developed nausea and vomiting last night. He thinks that it is because of a new medication that he started. He was prescribed Uribel for dysuria. He was recently treated for urinary tract infection, but he reports that the culture did not show any bacteria. Patient denies abdominal pain. There is no chest pain or shortness of breath.  Patient is a 81 y.o. male presenting with hyperglycemia.  Hyperglycemia Associated symptoms: nausea and vomiting     Past Medical History  Diagnosis Date  . Diabetes mellitus   . Asthma   . Hydronephrosis   . Kidney disease     Stage III chronic kidney disease  . Hypertension   . Neurogenic bladder   . Osteoporosis   . Hypogonadism male   . Recurrent urinary tract infection     With resistent Pseudomonas  . Allergic rhinitis   . Hepatitis C     due to blood transfusion  . Hyperlipidemia   . SOB (shortness of breath)   . CKD (chronic kidney disease), stage III   . Combined systolic and diastolic congestive heart failure   . COPD (chronic obstructive pulmonary disease)    Past Surgical History  Procedure Laterality Date  . Hernia repair    . Tonsillectomy    . Bladder repair      obstruction surgery  . Frontal sinus obliteration      sinus repaired with a plate  . Cataract extraction  2015   Family History  Problem Relation Age of Onset  . Heart failure Mother   . Hypertension Mother   . COPD Father    smoked  . Diabetes Father   . Cancer Sister     Breast  . Diabetes Sister   . COPD Brother     smoked   Social History  Substance Use Topics  . Smoking status: Former Smoker    Types: Cigars, Cigarettes    Quit date: 07/21/2011  . Smokeless tobacco: None     Comment: smoked occ cigar and pipe  . Alcohol Use: No    Review of Systems  Gastrointestinal: Positive for nausea and vomiting.  All other systems reviewed and are negative.     Allergies  Codeine; Gentamicin; and Ciprofloxacin  Home Medications   Prior to Admission medications   Medication Sig Start Date End Date Taking? Authorizing Provider  albuterol (2.5 MG/3ML) 0.083% NEBU 3 mL, albuterol (5 MG/ML) 0.5% NEBU 0.5 mL Inhale 5 mg into the lungs every 6 (six) hours as needed (wheezing).   Yes Historical Provider, MD  albuterol (PROVENTIL,VENTOLIN) 90 MCG/ACT inhaler Inhale 2 puffs into the lungs every 4 (four) hours as needed for wheezing or shortness of breath.    Yes Historical Provider, MD  cholecalciferol (VITAMIN D) 1000 UNITS tablet Take 1,000 Units by mouth daily.   Yes Historical Provider, MD  Insulin Glargine (TOUJEO SOLOSTAR) 300 UNIT/ML SOPN Inject 14 Units into the skin at bedtime.   Yes Historical Provider, MD  insulin lispro (HUMALOG) 100 UNIT/ML injection Inject 4-16 Units into the skin 3 (three) times daily as needed for high blood sugar (high blood sugar). Injects 8 units at breakfast, 4 units lunch, 8 to 10 units at dinner  Adjusted to blood glucose level. Patient adds an additional unit for every 50mg /dL   Yes Historical Provider, MD  Meth-Hyo-M Bl-Na Phos-Ph Sal (URIBEL) 118 MG CAPS Take 1 capsule by mouth 2 (two) times daily.   Yes Historical Provider, MD  simvastatin (ZOCOR) 40 MG tablet Take 40 mg by mouth daily.     Yes Historical Provider, MD  Tiotropium Bromide-Olodaterol (STIOLTO RESPIMAT) 2.5-2.5 MCG/ACT AERS Inhale 2 puffs into the lungs daily. Patient taking differently: Inhale 1 puff into  the lungs daily as needed (for shortness of breath).  03/27/15  Yes Nyoka Cowden, MD  zolpidem (AMBIEN) 5 MG tablet Take 2.5 mg by mouth daily as needed for sleep (sleep).  10/14/14  Yes Historical Provider, MD  saccharomyces boulardii (FLORASTOR) 250 MG capsule Take 1 capsule (250 mg total) by mouth 2 (two) times daily. Patient not taking: Reported on 04/06/2015 01/26/15   Alison Murray, MD  UNABLE TO FIND Narrow rolling walker with 5 inch wheels. Diagnosis: Generalized weakness and functional limitations. Patient not taking: Reported on 04/06/2015 01/07/15   Elease Etienne, MD   BP 102/54 mmHg  Pulse 61  Temp(Src) 97.8 F (36.6 C) (Oral)  Resp 18  SpO2 98% Physical Exam  Constitutional: He is oriented to person, place, and time. He appears well-developed and well-nourished. No distress.  HENT:  Head: Normocephalic and atraumatic.  Right Ear: Hearing normal.  Left Ear: Hearing normal.  Nose: Nose normal.  Mouth/Throat: Oropharynx is clear and moist and mucous membranes are normal.  Eyes: Conjunctivae and EOM are normal. Pupils are equal, round, and reactive to light.  Neck: Normal range of motion. Neck supple.  Cardiovascular: Regular rhythm, S1 normal and S2 normal.  Exam reveals no gallop and no friction rub.   No murmur heard. Pulmonary/Chest: Effort normal and breath sounds normal. No respiratory distress. He exhibits no tenderness.  Abdominal: Soft. Normal appearance and bowel sounds are normal. There is no hepatosplenomegaly. There is no tenderness. There is no rebound, no guarding, no tenderness at McBurney's point and negative Murphy's sign. No hernia.  Musculoskeletal: Normal range of motion.  Neurological: He is alert and oriented to person, place, and time. He has normal strength. No cranial nerve deficit or sensory deficit. Coordination normal. GCS eye subscore is 4. GCS verbal subscore is 5. GCS motor subscore is 6.  Skin: Skin is warm, dry and intact. No rash noted. No  cyanosis.  Psychiatric: He has a normal mood and affect. His speech is normal and behavior is normal. Thought content normal.  Nursing note and vitals reviewed.   ED Course  Procedures (including critical care time) Labs Review Labs Reviewed  CBC WITH DIFFERENTIAL/PLATELET - Abnormal; Notable for the following:    WBC 11.7 (*)    RBC 3.80 (*)    Hemoglobin 11.2 (*)    HCT 34.4 (*)    Neutro Abs 9.4 (*)    Monocytes Absolute 1.3 (*)    All other components within normal limits  COMPREHENSIVE METABOLIC PANEL - Abnormal; Notable for the following:    Sodium 131 (*)    CO2 17 (*)    Glucose, Bld 386 (*)    BUN 81 (*)    Creatinine, Ser 2.65 (*)    Calcium  8.1 (*)    Total Protein 6.3 (*)    ALT 13 (*)    GFR calc non Af Amer 21 (*)    GFR calc Af Amer 24 (*)    All other components within normal limits  URINALYSIS, ROUTINE W REFLEX MICROSCOPIC (NOT AT [facility]) - Abnormal; Notable for the following:    Color, Urine GREEN (*)    APPearance TURBID (*)    Glucose, UA 500 (*)    Hgb urine dipstick MODERATE (*)    Protein, ur 100 (*)    Leukocytes, UA LARGE (*)    All other components within normal limits  BETA-HYDROXYBUTYRIC ACID - Abnormal; Notable for the following:    Beta-Hydroxybutyric Acid 1.66 (*)    All other components within normal limits  URINE MICROSCOPIC-ADD ON - Abnormal; Notable for the following:    Bacteria, UA FIELD OBSCURED BY WBC'S (*)    All other components within normal limits  CBG MONITORING, ED - Abnormal; Notable for the following:    Glucose-Capillary 471 (*)    All other components within normal limits  CBG MONITORING, ED - Abnormal; Notable for the following:    Glucose-Capillary 391 (*)    All other components within normal limits  CBG MONITORING, ED - Abnormal; Notable for the following:    Glucose-Capillary 343 (*)    All other components within normal limits  URINE CULTURE  LIPASE, BLOOD    Imaging Review No results found. I have  personally reviewed and evaluated these images and lab results as part of my medical decision-making.   EKG Interpretation None       Date: 04/06/2015  Rate: 67  Rhythm: normal sinus rhythm  QRS Axis: normal  Intervals: normal  ST/T Wave abnormalities: normal  Conduction Disutrbances: none  Narrative Interpretation: unremarkable     MDM   Final diagnoses:  None  DKA UTI  Presents to the ER for evaluation of 2 days of elevated blood sugars with onset of nausea and vomiting yesterday. Patient continues to have symptoms today. He is without other complaints. He is not expressing chest pain or shortness of breath. His blood sugars did not respond to insulin corrections at home. He does have evidence of mild diabetic ketoacidosis here in the ER. He is also likely dehydrated. Patient initiated on glucose stabilizer. He will be admitted by the hospitalist service.    Gilda Crease, MD 04/06/15 2123

## 2015-04-07 DIAGNOSIS — E43 Unspecified severe protein-calorie malnutrition: Secondary | ICD-10-CM

## 2015-04-07 DIAGNOSIS — N3000 Acute cystitis without hematuria: Secondary | ICD-10-CM

## 2015-04-07 DIAGNOSIS — N184 Chronic kidney disease, stage 4 (severe): Secondary | ICD-10-CM

## 2015-04-07 DIAGNOSIS — E1010 Type 1 diabetes mellitus with ketoacidosis without coma: Secondary | ICD-10-CM

## 2015-04-07 DIAGNOSIS — N179 Acute kidney failure, unspecified: Secondary | ICD-10-CM

## 2015-04-07 LAB — BASIC METABOLIC PANEL
ANION GAP: 9 (ref 5–15)
ANION GAP: 7 (ref 5–15)
ANION GAP: 7 (ref 5–15)
Anion gap: 8 (ref 5–15)
Anion gap: 8 (ref 5–15)
BUN: 69 mg/dL — ABNORMAL HIGH (ref 6–20)
BUN: 62 mg/dL — ABNORMAL HIGH (ref 6–20)
BUN: 55 mg/dL — ABNORMAL HIGH (ref 6–20)
BUN: 53 mg/dL — ABNORMAL HIGH (ref 6–20)
BUN: 51 mg/dL — ABNORMAL HIGH (ref 6–20)
CALCIUM: 8 mg/dL — AB (ref 8.9–10.3)
CALCIUM: 7.7 mg/dL — AB (ref 8.9–10.3)
CALCIUM: 7.6 mg/dL — AB (ref 8.9–10.3)
CHLORIDE: 109 mmol/L (ref 101–111)
CHLORIDE: 108 mmol/L (ref 101–111)
CHLORIDE: 108 mmol/L (ref 101–111)
CHLORIDE: 110 mmol/L (ref 101–111)
CO2: 18 mmol/L — ABNORMAL LOW (ref 22–32)
CO2: 18 mmol/L — AB (ref 22–32)
CO2: 19 mmol/L — AB (ref 22–32)
CO2: 20 mmol/L — AB (ref 22–32)
CO2: 19 mmol/L — AB (ref 22–32)
CREATININE: 2.23 mg/dL — AB (ref 0.61–1.24)
CREATININE: 2.06 mg/dL — AB (ref 0.61–1.24)
Calcium: 8 mg/dL — ABNORMAL LOW (ref 8.9–10.3)
Calcium: 7.8 mg/dL — ABNORMAL LOW (ref 8.9–10.3)
Chloride: 109 mmol/L (ref 101–111)
Creatinine, Ser: 2.25 mg/dL — ABNORMAL HIGH (ref 0.61–1.24)
Creatinine, Ser: 1.98 mg/dL — ABNORMAL HIGH (ref 0.61–1.24)
Creatinine, Ser: 1.88 mg/dL — ABNORMAL HIGH (ref 0.61–1.24)
GFR calc Af Amer: 29 mL/min — ABNORMAL LOW (ref >60)
GFR calc non Af Amer: 25 mL/min — ABNORMAL LOW (ref >60)
GFR calc non Af Amer: 28 mL/min — ABNORMAL LOW (ref >60)
GFR calc non Af Amer: 29 mL/min — ABNORMAL LOW (ref >60)
GFR calc non Af Amer: 31 mL/min — ABNORMAL LOW (ref >60)
GFR, EST AFRICAN AMERICAN: 29 mL/min — AB (ref >60)
GFR, EST AFRICAN AMERICAN: 32 mL/min — AB (ref >60)
GFR, EST AFRICAN AMERICAN: 34 mL/min — AB (ref >60)
GFR, EST AFRICAN AMERICAN: 36 mL/min — AB (ref >60)
GFR, EST NON AFRICAN AMERICAN: 25 mL/min — AB (ref >60)
GLUCOSE: 139 mg/dL — AB (ref 65–99)
GLUCOSE: 229 mg/dL — AB (ref 65–99)
GLUCOSE: 228 mg/dL — AB (ref 65–99)
GLUCOSE: 239 mg/dL — AB (ref 65–99)
GLUCOSE: 218 mg/dL — AB (ref 65–99)
Potassium: 4.5 mmol/L (ref 3.5–5.1)
Potassium: 4.5 mmol/L (ref 3.5–5.1)
Potassium: 4.5 mmol/L (ref 3.5–5.1)
Potassium: 4.9 mmol/L (ref 3.5–5.1)
Potassium: 5 mmol/L (ref 3.5–5.1)
Sodium: 136 mmol/L (ref 135–145)
Sodium: 135 mmol/L (ref 135–145)
Sodium: 135 mmol/L (ref 135–145)
Sodium: 135 mmol/L (ref 135–145)
Sodium: 136 mmol/L (ref 135–145)

## 2015-04-07 LAB — CBC
HEMATOCRIT: 33.3 % — AB (ref 39.0–52.0)
HEMOGLOBIN: 11 g/dL — AB (ref 13.0–17.0)
MCH: 29.9 pg (ref 26.0–34.0)
MCHC: 33 g/dL (ref 30.0–36.0)
MCV: 90.5 fL (ref 78.0–100.0)
Platelets: 387 10*3/uL (ref 150–400)
RBC: 3.68 MIL/uL — AB (ref 4.22–5.81)
RDW: 14.6 % (ref 11.5–15.5)
WBC: 10.8 10*3/uL — ABNORMAL HIGH (ref 4.0–10.5)

## 2015-04-07 LAB — CBG MONITORING, ED
GLUCOSE-CAPILLARY: 109 mg/dL — AB (ref 65–99)
GLUCOSE-CAPILLARY: 136 mg/dL — AB (ref 65–99)
GLUCOSE-CAPILLARY: 273 mg/dL — AB (ref 65–99)
Glucose-Capillary: 191 mg/dL — ABNORMAL HIGH (ref 65–99)

## 2015-04-07 LAB — GLUCOSE, CAPILLARY
GLUCOSE-CAPILLARY: 144 mg/dL — AB (ref 65–99)
GLUCOSE-CAPILLARY: 158 mg/dL — AB (ref 65–99)
Glucose-Capillary: 127 mg/dL — ABNORMAL HIGH (ref 65–99)
Glucose-Capillary: 123 mg/dL — ABNORMAL HIGH (ref 65–99)
Glucose-Capillary: 210 mg/dL — ABNORMAL HIGH (ref 65–99)
Glucose-Capillary: 192 mg/dL — ABNORMAL HIGH (ref 65–99)

## 2015-04-07 MED ORDER — ALBUTEROL SULFATE (2.5 MG/3ML) 0.083% IN NEBU: 5.0000 mg | INHALATION_SOLUTION | Freq: Four times a day (QID) | RESPIRATORY_TRACT | Status: DC | PRN

## 2015-04-07 MED ORDER — SIMVASTATIN 40 MG PO TABS
40.0000 mg | ORAL_TABLET | Freq: Every day | ORAL | Status: DC
  Administered 2015-04-07: 40 mg via ORAL
  Filled 2015-04-07: qty 1

## 2015-04-07 MED ORDER — INSULIN ASPART 100 UNIT/ML ~~LOC~~ SOLN
0.0000 [IU] | Freq: Three times a day (TID) | SUBCUTANEOUS | Status: DC
Start: 2015-04-07 — End: 2015-04-08
  Administered 2015-04-07: 5 [IU] via SUBCUTANEOUS
  Administered 2015-04-07: 3 [IU] via SUBCUTANEOUS
  Administered 2015-04-07: 2 [IU] via SUBCUTANEOUS
  Administered 2015-04-08: 3 [IU] via SUBCUTANEOUS

## 2015-04-07 MED ORDER — INSULIN GLARGINE 100 UNIT/ML ~~LOC~~ SOLN
14.0000 [IU] | SUBCUTANEOUS | Status: AC
  Administered 2015-04-07: 14 [IU] via SUBCUTANEOUS
  Filled 2015-04-07: qty 0.14

## 2015-04-07 MED ORDER — DEXTROSE 5 % IV SOLN
1.0000 g | INTRAVENOUS | Status: DC
  Administered 2015-04-07 – 2015-04-08 (×2): 1 g via INTRAVENOUS
  Filled 2015-04-07 (×3): qty 10

## 2015-04-07 MED ORDER — ALBUTEROL SULFATE (5 MG/ML) 0.5% IN NEBU: 5.0000 mg | INHALATION_SOLUTION | Freq: Four times a day (QID) | RESPIRATORY_TRACT | Status: DC | PRN

## 2015-04-07 MED ORDER — VITAMIN D 1000 UNITS PO TABS
1000.0000 [IU] | ORAL_TABLET | Freq: Every day | ORAL | Status: DC
  Administered 2015-04-07 – 2015-04-08 (×2): 1000 [IU] via ORAL
  Filled 2015-04-07 (×2): qty 1

## 2015-04-07 MED ORDER — SODIUM CHLORIDE 0.9 % IV SOLN
INTRAVENOUS | Status: AC
  Administered 2015-04-07: 06:00:00 via INTRAVENOUS

## 2015-04-07 MED ORDER — DEXTROSE-NACL 5-0.45 % IV SOLN: INTRAVENOUS | Status: DC

## 2015-04-07 MED ORDER — INSULIN ASPART 100 UNIT/ML ~~LOC~~ SOLN
0.0000 [IU] | Freq: Every day | SUBCUTANEOUS | Status: DC
Start: 2015-04-07 — End: 2015-04-08

## 2015-04-07 MED ORDER — INSULIN ASPART 100 UNIT/ML ~~LOC~~ SOLN
3.0000 [IU] | Freq: Three times a day (TID) | SUBCUTANEOUS | Status: DC
  Administered 2015-04-07 – 2015-04-08 (×4): 3 [IU] via SUBCUTANEOUS

## 2015-04-07 MED ORDER — SODIUM CHLORIDE 0.9 % IV SOLN
INTRAVENOUS | Status: DC
  Administered 2015-04-07: 06:00:00 via INTRAVENOUS

## 2015-04-07 MED ORDER — INSULIN GLARGINE 100 UNIT/ML ~~LOC~~ SOLN
14.0000 [IU] | Freq: Every day | SUBCUTANEOUS | Status: DC
Start: 2015-04-07 — End: 2015-04-08
  Administered 2015-04-07: 14 [IU] via SUBCUTANEOUS
  Filled 2015-04-07 (×2): qty 0.14

## 2015-04-07 MED ORDER — SODIUM CHLORIDE 0.9 % IV SOLN: INTRAVENOUS | Status: AC

## 2015-04-07 MED ORDER — ONDANSETRON HCL 4 MG/2ML IJ SOLN: 4.0000 mg | Freq: Four times a day (QID) | INTRAMUSCULAR | Status: DC | PRN

## 2015-04-07 MED ORDER — ZOLPIDEM TARTRATE 5 MG PO TABS: 2.5000 mg | ORAL_TABLET | Freq: Every day | ORAL | Status: DC | PRN

## 2015-04-07 MED ORDER — ENOXAPARIN SODIUM 30 MG/0.3ML ~~LOC~~ SOLN
30.0000 mg | SUBCUTANEOUS | Status: DC
  Administered 2015-04-07: 30 mg via SUBCUTANEOUS
  Filled 2015-04-07 (×2): qty 0.3

## 2015-04-07 NOTE — Progress Notes (Signed)
[facility] PROGRESS NOTE  Benjamin Riddle NFA:[id number] DOB: 23-Jan-1928 DOA: 04/06/2015 PCP: Ezequiel Kayser, MD  Assessment/Plan: 1. Probable early DKA -Patient with history of insulin dependent diabetes mellitus, had been on 14 units of Lantus at home, presented with signs and symptoms of hyperglycemia. Lab work revealing blood sugar of 386 with bicarbonate of 17. An hour gap was 10. -He was started on IV insulin placed on DKA protocol. -This morning he was transitioned to his basal insulin with discontinuation of insulin drip. -Blood sugars much improved. -Will continue treating underlying infectious process.  2.  Urinary tract infection. -I think infectious process likely precipitated hyperglycemia and early DKA. -Urinalysis revealed a turbid urine with pyuria  -He was started on empiric IV ceftriaxone -Follow-up on urine cultures.  3.  Acute on chronic kidney disease stage IV -Likely related from dehydration -His creatinine trended down from 2.65 on admission to 2.23 on this mornings labs with IV fluid resuscitation  4.  Chronic combined systolic and diastolic congestive heart failure -His last transthoracic echocardiogram performed on 09/08/2011 that showed an EF of 45-50% with diffuse hypokinesis and a grade 1 diastolic dysfunction -He received IV fluids overnight, does not appear volume overloaded this morning. -Will encourage oral intake, stop IV fluids for now   Code Status: full code Family Communication:  Disposition Plan: Advancing his diet today, anticipate discharge in the next 14 hours remained stable.   Consultants:  Physical therapy  Antibiotics: Ceftriaxone 1 g IV every 24 hours  HPI/Subjective: Patient is a pleasant 81 year old gentleman with a past medical history of diabetes mellitus, history of chronic combined systolic and diastolic congestive heart failure, admitted to the medicine service on 04/06/2015 when he presented with hyperglycemia and  possibly early diabetic ketoacidosis. Urinalysis also revealed the presence of bacteria and leukocyte esterase, suspect infectious process precipitating hyperglycemia. He was placed on IV insulin, overnight remained stable, this morning IV insulin was discontinued as he was transitioned to Lantus.   Objective: Filed Vitals:   04/07/15 1124  BP: 91/35  Pulse: 61  Temp: 98.2 F (36.8 C)  Resp: 18    Intake/Output Summary (Last 24 hours) at 04/07/15 1213 Last data filed at 04/07/15 4696  Gross per 24 hour  Intake 617.92 ml  Output    900 ml  Net -282.08 ml   Filed Weights   04/07/15 0324  Weight: 59.149 kg (130 lb 6.4 oz)    Exam:   General:  Patient is in no acute distress, he was assisted out of bed and ambulated down the hallway and back, appears on study on his feet  Cardiovascular: regular rate and rhythm normal S1-S2  Respiratory: normal respiratory effort, lungs are clear to auscultation bilaterally  Abdomen: soft nontender nondistended  Musculoskeletal: sarcopenic, lower extremity muscle weakness  Data Reviewed: Basic Metabolic Panel:  Recent Labs Lab 04/06/15 1745 04/07/15 0605 04/07/15 1003  NA 131* 136 135  K 5.0 4.5 4.5  CL 104 109 109  CO2 17* 18* 18*  GLUCOSE 386* 139* 229*  BUN 81* 69* 62*  CREATININE 2.65* 2.25* 2.23*  CALCIUM 8.1* 8.0* 7.7*   Liver Function Tests:  Recent Labs Lab 04/06/15 1745  AST 15  ALT 13*  ALKPHOS 50  BILITOT 0.7  PROT 6.3*  ALBUMIN 3.5    Recent Labs Lab 04/06/15 1745  LIPASE 41   No results for input(s): AMMONIA in the last 168 hours. CBC:  Recent Labs Lab 04/06/15 1745 04/07/15 0605  WBC 11.7* 10.8*  NEUTROABS  9.4*  --   HGB 11.2* 11.0*  HCT 34.4* 33.3*  MCV 90.5 90.5  PLT 398 387   Cardiac Enzymes: No results for input(s): CKTOTAL, CKMB, CKMBINDEX, TROPONINI in the last 168 hours. BNP (last 3 results) No results for input(s): BNP in the last 8760 hours.  ProBNP (last 3 results) No  results for input(s): PROBNP in the last 8760 hours.  CBG:  Recent Labs Lab 04/07/15 0302 04/07/15 0432 04/07/15 0626 04/07/15 0822 04/07/15 1123  GLUCAP 109* 127* 123* 210* 144*    No results found for this or any previous visit (from the past 240 hour(s)).   Studies: No results found.  Scheduled Meds: . cefTRIAXone (ROCEPHIN)  IV  1 g Intravenous Q24H  . cholecalciferol  1,000 Units Oral Daily  . enoxaparin (LOVENOX) injection  30 mg Subcutaneous Q24H  . insulin aspart  0-15 Units Subcutaneous TID WC  . insulin aspart  0-5 Units Subcutaneous QHS  . insulin aspart  3 Units Subcutaneous TID WC  . insulin glargine  14 Units Subcutaneous QHS  . simvastatin  40 mg Oral q1800   Continuous Infusions: . dextrose 5 % and 0.45% NaCl 75 mL/hr at 04/07/15 0335    Active Problems:   HLD (hyperlipidemia)   DKA (diabetic ketoacidoses)   CKD (chronic kidney disease), stage IV   Protein-calorie malnutrition, severe   Bladder outlet obstruction    Time spent: 30 min    Jeralyn Bennett  [facility] Pager [phone]. If 7PM-7AM, please contact night-coverage at [email or web address], password [facility] 04/07/2015, 12:13 PM  LOS: 1 day

## 2015-04-07 NOTE — Progress Notes (Signed)
Notified Dr. Vanessa Barbara of patient's BP in the 90s, patient denies any distress/dizziness, no new order given, will continue to assess patient.

## 2015-04-07 NOTE — Evaluation (Signed)
Physical Therapy Evaluation Patient Details Name: Benjamin Riddle MRN: [id number] DOB: 1928-03-21 Today's Date: 04/07/2015   History of Present Illness  81 y.o. male with PMH of IDDM, asthma, COPD, CKD-III, DM, GERD, HTN, neurogenic bladder (doing self cath), osteoporosis, recurrent UTI, Hep C, combined systolic and diastolic congestive heart failure, who was admitted with DKA.  Clinical Impression  Pt admitted with above diagnosis. Pt currently with functional limitations due to the deficits listed below (see PT Problem List). Pt walked 160' with min/guard assist for safety/balance with RW. HHPT recommended.  Pt will benefit from skilled PT to increase their independence and safety with mobility to allow discharge to the venue listed below.       Follow Up Recommendations Home health PT    Equipment Recommendations  None recommended by PT    Recommendations for Other Services       Precautions / Restrictions Precautions Precautions: Fall Precaution Comments: pt/wife deny falls      Mobility  Bed Mobility Overal bed mobility: Independent                Transfers Overall transfer level: Needs assistance Equipment used: Rolling walker (2 wheeled) Transfers: Sit to/from Stand Sit to Stand: Supervision         General transfer comment: cues for hand placement  Ambulation/Gait Ambulation/Gait assistance: Min guard Ambulation Distance (Feet): 160 Feet Assistive device: Rolling walker (2 wheeled) Gait Pattern/deviations: Step-to pattern;Decreased step length - left;Decreased stride length;Trunk flexed   Gait velocity interpretation: at or above normal speed for age/gender General Gait Details: verbal cues for positioning in RW, especially with turns as he walks outside and behind frame of RW  Stairs            Wheelchair Mobility    Modified Rankin (Stroke Patients Only)       Balance Overall balance assessment: Needs assistance   Sitting  balance-Leahy Scale: Good       Standing balance-Leahy Scale: Fair                               Pertinent Vitals/Pain Pain Assessment: No/denies pain    Home Living Family/patient expects to be discharged to:: Private residence Living Arrangements: Spouse/significant other Available Help at Discharge: Available 24 hours/day   Home Access: Stairs to enter   [employer] of Steps: 1 Home Layout: One level Home Equipment: Cane - single point;Walker - 2 wheels;Walker - 4 wheels      Prior Function Level of Independence: Independent with assistive device(s)         Comments: uses RW or cane     Hand Dominance        Extremity/Trunk Assessment   Upper Extremity Assessment: Overall WFL for tasks assessed           Lower Extremity Assessment: Overall WFL for tasks assessed      Cervical / Trunk Assessment: Kyphotic  Communication   Communication: No difficulties  Cognition Arousal/Alertness: Awake/alert Behavior During Therapy: WFL for tasks assessed/performed Overall Cognitive Status: Within Functional Limits for tasks assessed                      General Comments      Exercises        Assessment/Plan    PT Assessment Patient needs continued PT services  PT Diagnosis Difficulty walking;Generalized weakness   PT Problem List Decreased balance;Decreased knowledge of use of DME;Decreased  mobility  PT Treatment Interventions Gait training;Functional mobility training;Therapeutic activities;Patient/family education;Therapeutic exercise   PT Goals (Current goals can be found in the Care Plan section) Acute Rehab PT Goals Patient Stated Goal: to watch sports on tv PT Goal Formulation: With patient Time For Goal Achievement: 04/21/15 Potential to Achieve Goals: Good    Frequency Min 3X/week   Barriers to discharge        Co-evaluation               End of Session Equipment Utilized During Treatment: Gait  belt Activity Tolerance: Patient tolerated treatment well Patient left: in chair;with call bell/phone within reach;with family/visitor present Nurse Communication: Mobility status         Time: 1300-1316 PT Time Calculation (min) (ACUTE ONLY): 16 min   Charges:   PT Evaluation $Initial PT Evaluation Tier I: 1 Procedure     PT G CodesTamala Ser 04/07/2015, 1:22 PM [phone]

## 2015-04-08 LAB — BASIC METABOLIC PANEL
ANION GAP: 5 (ref 5–15)
BUN: 52 mg/dL — ABNORMAL HIGH (ref 6–20)
CALCIUM: 8 mg/dL — AB (ref 8.9–10.3)
CO2: 21 mmol/L — ABNORMAL LOW (ref 22–32)
CREATININE: 1.7 mg/dL — AB (ref 0.61–1.24)
Chloride: 113 mmol/L — ABNORMAL HIGH (ref 101–111)
GFR, EST AFRICAN AMERICAN: 41 mL/min — AB (ref >60)
GFR, EST NON AFRICAN AMERICAN: 35 mL/min — AB (ref >60)
Glucose, Bld: 105 mg/dL — ABNORMAL HIGH (ref 65–99)
Potassium: 4.6 mmol/L (ref 3.5–5.1)
SODIUM: 139 mmol/L (ref 135–145)

## 2015-04-08 LAB — CBC
HEMATOCRIT: 32 % — AB (ref 39.0–52.0)
HEMOGLOBIN: 10.6 g/dL — AB (ref 13.0–17.0)
MCH: 29.9 pg (ref 26.0–34.0)
MCHC: 33.1 g/dL (ref 30.0–36.0)
MCV: 90.4 fL (ref 78.0–100.0)
Platelets: 357 10*3/uL (ref 150–400)
RBC: 3.54 MIL/uL — ABNORMAL LOW (ref 4.22–5.81)
RDW: 14.9 % (ref 11.5–15.5)
WBC: 7.5 10*3/uL (ref 4.0–10.5)

## 2015-04-08 LAB — URINE CULTURE: Culture: >=100000

## 2015-04-08 LAB — HEMOGLOBIN A1C
HEMOGLOBIN A1C: 10.1 % — AB (ref 4.8–5.6)
MEAN PLASMA GLUCOSE: 243 mg/dL

## 2015-04-08 LAB — GLUCOSE, CAPILLARY
GLUCOSE-CAPILLARY: 93 mg/dL (ref 65–99)
Glucose-Capillary: 62 mg/dL — ABNORMAL LOW (ref 65–99)
Glucose-Capillary: 193 mg/dL — ABNORMAL HIGH (ref 65–99)

## 2015-04-08 MED ORDER — AMOXICILLIN-POT CLAVULANATE 875-125 MG PO TABS
1.0000 | ORAL_TABLET | Freq: Two times a day (BID) | ORAL | Status: DC
  Administered 2015-04-08: 1 via ORAL
  Filled 2015-04-08: qty 1

## 2015-04-08 MED ORDER — ACETAMINOPHEN 325 MG PO TABS
650.0000 mg | ORAL_TABLET | ORAL | Status: DC | PRN
  Administered 2015-04-08: 650 mg via ORAL
  Filled 2015-04-08: qty 2

## 2015-04-08 MED ORDER — AMOXICILLIN-POT CLAVULANATE 875-125 MG PO TABS: 1.0000 | ORAL_TABLET | Freq: Two times a day (BID) | ORAL | Status: DC

## 2015-04-08 NOTE — Discharge Summary (Signed)
Physician Discharge Summary  Benjamin Riddle YNW:[id number] DOB: 1928-02-28 DOA: 04/06/2015  PCP: Ezequiel Kayser, MD  Admit date: 04/06/2015 Discharge date: 04/08/2015  Time spent: 35 minutes  Recommendations for Outpatient Follow-up:  1. Please follow-up on patient's blood sugars, he presented with hyperglycemia and probable early DKA 2. He was diagnosed with urinary tract infection and discharged on a seven-day course of Augmentin, instructed to follow-up with his primary care provider and urologist 3. His wife is concerned with his weight loss, could be related to his chronic medical conditions or depression. I instructed him to follow up with his PCP to discuss possible underlying depression.  4. Prior to discharge home health services consulted for PT/OT   Discharge Diagnoses:  Active Problems:   HLD (hyperlipidemia)   DKA (diabetic ketoacidoses)   CKD (chronic kidney disease), stage IV   Protein-calorie malnutrition, severe   Bladder outlet obstruction   Discharge Condition: Stable  Diet recommendation: Carb Mod Diet  Filed Weights   04/07/15 0324  Weight: 59.149 kg (130 lb 6.4 oz)    History of present illness:  Patient is a very pleasant 81 year old who looks much younger than his stated age who presented to the hospital today because his sugars have been reading high on his machine for the last 3-4 days. They have been trying to control it by increasing his sliding scale at home, today he also had increased urgency and dysuria and decided to come into the hospital for evaluation. He has a history of insulin-dependent diabetes, chronic kidney disease, neurogenic bladder requiring in and out caths twice a day, history of combined chronic systolic CHF. The ED he has not yet been able to produce a urine sample and his wife has gone him to obtain the special catheters that he uses, however his symptoms are very reminiscent of his prior UTIs, he is afebrile, with stable vital signs.  He has acute renal failure, mild hyponatremia and leukocytosis of 11.7. He is acidotic with a CO2 of 17, anion gap is normal at 10. We have been asked to admit him for further evaluation and management.  Hospital Course:  Patient is a pleasant 81 year old gentleman with a past medical history of diabetes mellitus, history of chronic combined systolic and diastolic congestive heart failure, admitted to the medicine service on 04/06/2015 when he presented with hyperglycemia and possibly early diabetic ketoacidosis. Urinalysis also revealed the presence of bacteria and leukocyte esterase, suspect infectious process precipitating hyperglycemia. He was placed on IV insulin, overnight remained stable, this morning IV insulin was discontinued as he was transitioned to Lantus. Previous urine cultures from 01/23/2015 growing enterococcus species. IV ceftriaxone was discontinued as he was transitioned to Augmentin 875 by mouth twice a day. By 04/08/2015 he reported doing well, was back to his baseline, with lab work showing improvement to his kidney function, with creatinine trending down from 2.65 on 04/07/2015 to 1.7. He appeared unsteady on ambulation /physical therapy was consulted. Prior to discharge he was set up with home health services for PT.    Consultations:  Physical therapy  Discharge Exam: Filed Vitals:   04/08/15 0622  BP: 124/78  Pulse: 64  Temp: 98 F (36.7 C)  Resp: 16     General: Patient is in no acute distress, looks better, awake and alert  Cardiovascular: regular rate and rhythm normal S1-S2  Respiratory: normal respiratory effort, lungs are clear to auscultation bilaterally  Abdomen: soft nontender nondistended  Musculoskeletal: sarcopenic, lower extremity muscle weakness  Discharge Instructions  Discharge Instructions    Call MD for:  difficulty breathing, headache or visual disturbances    Complete by:  As directed      Call MD for:  hives    Complete by:  As  directed      Call MD for:  persistant dizziness or light-headedness    Complete by:  As directed      Call MD for:  persistant nausea and vomiting    Complete by:  As directed      Call MD for:  redness, tenderness, or signs of infection (pain, swelling, redness, odor or green/yellow discharge around incision site)    Complete by:  As directed      Call MD for:  severe uncontrolled pain    Complete by:  As directed      Call MD for:  temperature >100.4    Complete by:  As directed      Call MD for:    Complete by:  As directed      Diet - low sodium heart healthy    Complete by:  As directed      Increase activity slowly    Complete by:  As directed           Current Discharge Medication List    START taking these medications   Details  amoxicillin-clavulanate (AUGMENTIN) 875-125 MG per tablet Take 1 tablet by mouth every 12 (twelve) hours. Qty: 14 tablet, Refills: 0      CONTINUE these medications which have NOT CHANGED   Details  albuterol (2.5 MG/3ML) 0.083% NEBU 3 mL, albuterol (5 MG/ML) 0.5% NEBU 0.5 mL Inhale 5 mg into the lungs every 6 (six) hours as needed (wheezing).    albuterol (PROVENTIL,VENTOLIN) 90 MCG/ACT inhaler Inhale 2 puffs into the lungs every 4 (four) hours as needed for wheezing or shortness of breath.     cholecalciferol (VITAMIN D) 1000 UNITS tablet Take 1,000 Units by mouth daily.    Insulin Glargine (TOUJEO SOLOSTAR) 300 UNIT/ML SOPN Inject 14 Units into the skin at bedtime.    insulin lispro (HUMALOG) 100 UNIT/ML injection Inject 4-16 Units into the skin 3 (three) times daily as needed for high blood sugar (high blood sugar). Injects 8 units at breakfast, 4 units lunch, 8 to 10 units at dinner  Adjusted to blood glucose level. Patient adds an additional unit for every /dL    Meth-Hyo-M Bl-Na Phos-Ph Sal (URIBEL) 118 MG CAPS Take 1 capsule by mouth 2 (two) times daily.    simvastatin (ZOCOR) 40 MG tablet Take 40 mg by mouth daily.       Tiotropium Bromide-Olodaterol (STIOLTO RESPIMAT) 2.5-2.5 MCG/ACT AERS Inhale 2 puffs into the lungs daily. Qty: 1 Inhaler, Refills: 11   Associated Diagnoses: COPD mixed type    zolpidem (AMBIEN) 5 MG tablet Take 2.5 mg by mouth daily as needed for sleep (sleep).  Refills: 0    saccharomyces boulardii (FLORASTOR) 250 MG capsule Take 1 capsule (250 mg total) by mouth 2 (two) times daily. Qty: 60 capsule, Refills: 0      STOP taking these medications     UNABLE TO FIND        Allergies  Allergen Reactions  . Codeine Nausea And Vomiting  . Gentamicin     Becomes dizzy and weak  . Ciprofloxacin Itching and Rash   Follow-up Information    Follow up with PERINI,MARK A, MD In 1 week.   Specialty:  Internal Medicine   Contact  information:   [address] [place] [address] [phone]        The results of significant diagnostics from this hospitalization (including imaging, microbiology, ancillary and laboratory) are listed below for reference.    Significant Diagnostic Studies: No results found.  Microbiology: No results found for this or any previous visit (from the past 240 hour(s)).   Labs: Basic Metabolic Panel:  Recent Labs Lab 04/07/15 1003 04/07/15 1353 04/07/15 1820 04/07/15 2207 04/08/15 0154  NA 135 135 135 136 139  K 4.5 4.5 4.9 5.0 4.6  CL 109 108 108 110 113*  CO2 18* 19* 20* 19* 21*  GLUCOSE 229* 228* 239* 218* 105*  BUN 62* 55* 53* 51* 52*  CREATININE 2.23* 2.06* 1.98* 1.88* 1.70*  CALCIUM 7.7* 7.6* 8.0* 7.8* 8.0*   Liver Function Tests:  Recent Labs Lab 04/06/15 1745  AST 15  ALT 13*  ALKPHOS 50  BILITOT 0.7  PROT 6.3*  ALBUMIN 3.5    Recent Labs Lab 04/06/15 1745  LIPASE 41   No results for input(s): AMMONIA in the last 168 hours. CBC:  Recent Labs Lab 04/06/15 1745 04/07/15 0605 04/08/15 0154  WBC 11.7* 10.8* 7.5  NEUTROABS 9.4*  --   --   HGB 11.2* 11.0* 10.6*  HCT 34.4* 33.3* 32.0*  MCV 90.5 90.5 90.4   PLT 398 387 357   Cardiac Enzymes: No results for input(s): CKTOTAL, CKMB, CKMBINDEX, TROPONINI in the last 168 hours. BNP: BNP (last 3 results) No results for input(s): BNP in the last 8760 hours.  ProBNP (last 3 results) No results for input(s): PROBNP in the last 8760 hours.  CBG:  Recent Labs Lab 04/07/15 0822 04/07/15 1123 04/07/15 1651 04/07/15 2145 04/08/15 0654  GLUCAP 210* 144* 158* 192* 62*       Signed:  ZAMORA, EZEQUIEL  [facility] 04/08/2015, 8:08 AM

## 2015-04-08 NOTE — Progress Notes (Signed)
Hypoglycemic Event  CBG: 62  Treatment: orange juice  Symptoms: none  Follow-up CBG: Time:0738 CBG Result:93  Possible Reasons for Event: Inadequate meal intake  Comments/MD notified:Zamora    Lamb, Sonya A  Remember to initiate Hypoglycemia Order Set & complete

## 2015-04-08 NOTE — Care Management Important Message (Signed)
Important Message  Patient Details  Name: Benjamin Riddle MRN: [id number] Date of Birth: Sep 16, 1927   Medicare Important Message Given:  Yes-second notification given    Haskell Flirt 04/08/2015, 11:03 AMImportant Message  Patient Details  Name: Benjamin Riddle MRN: [id number] Date of Birth: 10-Dec-1927   Medicare Important Message Given:  Yes-second notification given    Haskell Flirt 04/08/2015, 11:03 AM

## 2015-04-08 NOTE — Progress Notes (Signed)
Pt and wife at bedside selected [facility] for HHPT. Referral given to in house rep with [facility].

## 2015-04-08 NOTE — Progress Notes (Signed)
ANTIBIOTIC CONSULT NOTE - INITIAL  Pharmacy Consult for Augmentin Indication: UTI--plan D/C home  Allergies  Allergen Reactions  . Codeine Nausea And Vomiting  . Gentamicin     Becomes dizzy and weak  . Ciprofloxacin Itching and Rash    Patient Measurements: Height:  (175.3 cm) Weight: 130 lb 6.4 oz (59.149 kg) IBW/kg (Calculated) : 70.7 Adjusted Body Weight:   Vital Signs: Temp: 98 F (36.7 C) (09/19 0622) Temp Source: Oral (09/19 0622) BP: 124/78 mmHg (09/19 0622) Pulse Rate: 64 (09/19 0622) Intake/Output from previous day: 09/18 0701 - 09/19 0700 In: 720 [P.O.:720] Out: 1150 [Urine:1150] Intake/Output from this shift: Total I/O In: -  Out: 1150 [Urine:1150]  Labs:  Recent Labs  04/06/15 1745 04/07/15 0605  04/07/15 1820 04/07/15 2207 04/08/15 0154  WBC 11.7* 10.8*  --   --   --  7.5  HGB 11.2* 11.0*  --   --   --  10.6*  PLT 398 387  --   --   --  357  CREATININE 2.65* 2.25*  < > 1.98* 1.88* 1.70*  < > = values in this interval not displayed. Estimated Creatinine Clearance: 27 mL/min (by C-G formula based on Cr of 1.7). No results for input(s): VANCOTROUGH, VANCOPEAK, VANCORANDOM, GENTTROUGH, GENTPEAK, GENTRANDOM, TOBRATROUGH, TOBRAPEAK, TOBRARND, AMIKACINPEAK, AMIKACINTROU, AMIKACIN in the last 72 hours.   Microbiology: No results found for this or any previous visit (from the past 720 hour(s)).  Medical History: Past Medical History  Diagnosis Date  . Diabetes mellitus   . Asthma   . Hydronephrosis   . Kidney disease     Stage III chronic kidney disease  . Hypertension   . Neurogenic bladder   . Osteoporosis   . Hypogonadism male   . Recurrent urinary tract infection     With resistent Pseudomonas  . Allergic rhinitis   . Hepatitis C     due to blood transfusion  . Hyperlipidemia   . SOB (shortness of breath)   . CKD (chronic kidney disease), stage III   . Combined systolic and diastolic congestive heart failure   . COPD (chronic  obstructive pulmonary disease)     Medications:  Anti-infectives    Start     Dose/Rate Route Frequency Ordered Stop   04/08/15 0800  amoxicillin-clavulanate (AUGMENTIN) 875-125 MG per tablet 1 tablet     1 tablet Oral Every 12 hours 04/08/15 0636     04/07/15 0309  cefTRIAXone (ROCEPHIN) 1 g in dextrose 5 % 50 mL IVPB  Status:  Discontinued     1 g 100 mL/hr over 30 Minutes Intravenous Every 24 hours 04/07/15 0309 04/08/15 1610     Assessment: Patient was being treated for UTI with IV ceftriaxone, MD wishes to change to Augmentin for d/c home.  Patient with reduced renal function, but still q12hr dosing.  Last ceftriaxone dose noted 9/19 AM  Goal of Therapy:  Augmentin dosed based on patient weight and renal function   Plan:  Augment 875-125mg  PO bid, with 1st dose now.  Darlina Guys, Jacquenette Shone Crowford 04/08/2015,6:39 AM

## 2015-04-08 NOTE — Progress Notes (Signed)
Discharge teaching given to pt including medications, and follow-up appt. Pt verbalized understanding of all discharge instructions. IV access was d/c'd. Vitals are stable. Skin is intact except as charted in most recent assessments. Pt to be escorted out by RN, driven home by wife. Benjamin Riddle

## 2015-04-09 DIAGNOSIS — I10 Essential (primary) hypertension: Secondary | ICD-10-CM | POA: Diagnosis not present

## 2015-04-09 DIAGNOSIS — E780 Pure hypercholesterolemia: Secondary | ICD-10-CM | POA: Diagnosis not present

## 2015-04-09 DIAGNOSIS — E1022 Type 1 diabetes mellitus with diabetic chronic kidney disease: Secondary | ICD-10-CM | POA: Diagnosis not present

## 2015-04-09 DIAGNOSIS — R5382 Chronic fatigue, unspecified: Secondary | ICD-10-CM | POA: Diagnosis not present

## 2015-04-09 DIAGNOSIS — E1065 Type 1 diabetes mellitus with hyperglycemia: Secondary | ICD-10-CM | POA: Diagnosis not present

## 2015-04-10 DIAGNOSIS — Z87891 Personal history of nicotine dependence: Secondary | ICD-10-CM | POA: Diagnosis not present

## 2015-04-10 DIAGNOSIS — N390 Urinary tract infection, site not specified: Secondary | ICD-10-CM | POA: Diagnosis not present

## 2015-04-10 DIAGNOSIS — Z794 Long term (current) use of insulin: Secondary | ICD-10-CM | POA: Diagnosis not present

## 2015-04-10 DIAGNOSIS — E1122 Type 2 diabetes mellitus with diabetic chronic kidney disease: Secondary | ICD-10-CM | POA: Diagnosis not present

## 2015-04-10 DIAGNOSIS — E1165 Type 2 diabetes mellitus with hyperglycemia: Secondary | ICD-10-CM | POA: Diagnosis not present

## 2015-04-10 DIAGNOSIS — I5042 Chronic combined systolic (congestive) and diastolic (congestive) heart failure: Secondary | ICD-10-CM | POA: Diagnosis not present

## 2015-04-10 DIAGNOSIS — N184 Chronic kidney disease, stage 4 (severe): Secondary | ICD-10-CM | POA: Diagnosis not present

## 2015-04-10 DIAGNOSIS — J449 Chronic obstructive pulmonary disease, unspecified: Secondary | ICD-10-CM | POA: Diagnosis not present

## 2015-04-10 DIAGNOSIS — B1920 Unspecified viral hepatitis C without hepatic coma: Secondary | ICD-10-CM | POA: Diagnosis not present

## 2015-04-10 DIAGNOSIS — I129 Hypertensive chronic kidney disease with stage 1 through stage 4 chronic kidney disease, or unspecified chronic kidney disease: Secondary | ICD-10-CM | POA: Diagnosis not present

## 2015-04-10 DIAGNOSIS — M810 Age-related osteoporosis without current pathological fracture: Secondary | ICD-10-CM | POA: Diagnosis not present

## 2015-04-10 DIAGNOSIS — E43 Unspecified severe protein-calorie malnutrition: Secondary | ICD-10-CM | POA: Diagnosis not present

## 2015-04-12 DIAGNOSIS — E1165 Type 2 diabetes mellitus with hyperglycemia: Secondary | ICD-10-CM | POA: Diagnosis not present

## 2015-04-12 DIAGNOSIS — I129 Hypertensive chronic kidney disease with stage 1 through stage 4 chronic kidney disease, or unspecified chronic kidney disease: Secondary | ICD-10-CM | POA: Diagnosis not present

## 2015-04-12 DIAGNOSIS — E1122 Type 2 diabetes mellitus with diabetic chronic kidney disease: Secondary | ICD-10-CM | POA: Diagnosis not present

## 2015-04-12 DIAGNOSIS — I5042 Chronic combined systolic (congestive) and diastolic (congestive) heart failure: Secondary | ICD-10-CM | POA: Diagnosis not present

## 2015-04-12 DIAGNOSIS — N184 Chronic kidney disease, stage 4 (severe): Secondary | ICD-10-CM | POA: Diagnosis not present

## 2015-04-12 DIAGNOSIS — N390 Urinary tract infection, site not specified: Secondary | ICD-10-CM | POA: Diagnosis not present

## 2015-04-16 DIAGNOSIS — E1122 Type 2 diabetes mellitus with diabetic chronic kidney disease: Secondary | ICD-10-CM | POA: Diagnosis not present

## 2015-04-16 DIAGNOSIS — E1165 Type 2 diabetes mellitus with hyperglycemia: Secondary | ICD-10-CM | POA: Diagnosis not present

## 2015-04-16 DIAGNOSIS — N184 Chronic kidney disease, stage 4 (severe): Secondary | ICD-10-CM | POA: Diagnosis not present

## 2015-04-16 DIAGNOSIS — I129 Hypertensive chronic kidney disease with stage 1 through stage 4 chronic kidney disease, or unspecified chronic kidney disease: Secondary | ICD-10-CM | POA: Diagnosis not present

## 2015-04-16 DIAGNOSIS — N390 Urinary tract infection, site not specified: Secondary | ICD-10-CM | POA: Diagnosis not present

## 2015-04-16 DIAGNOSIS — I5042 Chronic combined systolic (congestive) and diastolic (congestive) heart failure: Secondary | ICD-10-CM | POA: Diagnosis not present

## 2015-04-17 DIAGNOSIS — E109 Type 1 diabetes mellitus without complications: Secondary | ICD-10-CM | POA: Diagnosis not present

## 2015-04-17 DIAGNOSIS — N390 Urinary tract infection, site not specified: Secondary | ICD-10-CM | POA: Diagnosis not present

## 2015-04-18 DIAGNOSIS — N184 Chronic kidney disease, stage 4 (severe): Secondary | ICD-10-CM | POA: Diagnosis not present

## 2015-04-18 DIAGNOSIS — E1122 Type 2 diabetes mellitus with diabetic chronic kidney disease: Secondary | ICD-10-CM | POA: Diagnosis not present

## 2015-04-18 DIAGNOSIS — N390 Urinary tract infection, site not specified: Secondary | ICD-10-CM | POA: Diagnosis not present

## 2015-04-18 DIAGNOSIS — E1165 Type 2 diabetes mellitus with hyperglycemia: Secondary | ICD-10-CM | POA: Diagnosis not present

## 2015-04-18 DIAGNOSIS — I129 Hypertensive chronic kidney disease with stage 1 through stage 4 chronic kidney disease, or unspecified chronic kidney disease: Secondary | ICD-10-CM | POA: Diagnosis not present

## 2015-04-18 DIAGNOSIS — I5042 Chronic combined systolic (congestive) and diastolic (congestive) heart failure: Secondary | ICD-10-CM | POA: Diagnosis not present

## 2015-04-23 ENCOUNTER — Ambulatory Visit (INDEPENDENT_AMBULATORY_CARE_PROVIDER_SITE_OTHER): Payer: Medicare Other

## 2015-04-23 ENCOUNTER — Ambulatory Visit (INDEPENDENT_AMBULATORY_CARE_PROVIDER_SITE_OTHER): Payer: Medicare Other | Admitting: Family Medicine

## 2015-04-23 VITALS — BP 128/70 | HR 62 | Temp 98.4°F | Resp 18 | Ht 68.0 in | Wt 131.0 lb

## 2015-04-23 DIAGNOSIS — R0789 Other chest pain: Secondary | ICD-10-CM

## 2015-04-23 DIAGNOSIS — W19XXXA Unspecified fall, initial encounter: Secondary | ICD-10-CM

## 2015-04-23 DIAGNOSIS — Y92099 Unspecified place in other non-institutional residence as the place of occurrence of the external cause: Secondary | ICD-10-CM | POA: Diagnosis not present

## 2015-04-23 DIAGNOSIS — Y92009 Unspecified place in unspecified non-institutional (private) residence as the place of occurrence of the external cause: Secondary | ICD-10-CM

## 2015-04-23 NOTE — Progress Notes (Signed)
Patient ID: Benjamin Riddle, male    DOB: [birth year]  Age: 81 y.o. MRN: [id number]  Chief Complaint  Patient presents with  . Fall    3 days ago on Sunday  . 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 He sat down on something and fell backwards against the wall Sunday. They had to get a neighbor to come help get him up. Since then he has been hurting is left flank. He has a history of urinary retention and has to self catheterize him self, however has not seen any new bleeding. He does intermittently get some bleeding. He has not had any problems with coughing up blood or shortness of breath. He has a history of frequent falls. He has several physician to manage them regularly, but he came in here to make sure nothing was broken.  Current allergies, medications, problem list, past/family and social histories reviewed.  Objective:  BP 128/70 mmHg  Pulse 62  Temp(Src) 98.4 F (36.9 C) (Oral)  Resp 18  Ht  (1.727 m)  Wt 131 lb (59.421 kg)  BMI 19.92 kg/m2  SpO2 95%  He is cold in the exam room, otherwise no major distress. He has a great deal of pain when he tries to move. He has chest is clear to auscultation. Heart sounds regular. He is very tender in the left posterior chest wall in the rib cage. The spine seems to be nontender. Pelvis and hip are nontender.  Assessment & Plan:   Assessment: 1. Left-sided chest wall pain   2. Fall at home, initial encounter       Plan: X-ray left posterior ribs  [facility] reading (PRIMARY) by  Dr. Alwyn Ren Normal ribs.  LUQ colonic gas. Suggestive of constipation..    Orders Placed This Encounter  Procedures  . DG Ribs Unilateral W/Chest Left    Order Specific Question:  Reason for Exam (SYMPTOM  OR DIAGNOSIS REQUIRED)    Answer:  fell sunday, left posterior chest wall tenderness    Order Specific Question:  Preferred imaging location?    Answer:   External     Patient Instructions  Take Tylenol as needed for pain. Maximum 3000 mg in 24 hours.  Can take ibuprofen in addition to that on occasion, but it can increase constipation and irritate the stomach of elderly.  If getting worse at anytime get rechecked  Wear the rib belt as needed  Take caution to avoid falls  Take some MiraLAX to get the bowels moving better. If necessary use a glycerin suppository or 2:00 suppository or Fleet's enema.  If worse abdominal pain at anytime go straight to the emergency room     Return if symptoms worsen or fail to improve.   Allysson Rinehimer, MD 04/23/2015

## 2015-04-23 NOTE — Patient Instructions (Signed)
Take Tylenol as needed for pain. Maximum 3000 mg in 24 hours.  Can take ibuprofen in addition to that on occasion, but it can increase constipation and irritate the stomach of elderly.  If getting worse at anytime get rechecked  Wear the rib belt as needed  Take caution to avoid falls  Take some MiraLAX to get the bowels moving better. If necessary use a glycerin suppository or 2:00 suppository or Fleet's enema.  If worse abdominal pain at anytime go straight to the emergency room

## 2015-04-28 DIAGNOSIS — I5042 Chronic combined systolic (congestive) and diastolic (congestive) heart failure: Secondary | ICD-10-CM | POA: Diagnosis not present

## 2015-04-28 DIAGNOSIS — N390 Urinary tract infection, site not specified: Secondary | ICD-10-CM | POA: Diagnosis not present

## 2015-04-28 DIAGNOSIS — I129 Hypertensive chronic kidney disease with stage 1 through stage 4 chronic kidney disease, or unspecified chronic kidney disease: Secondary | ICD-10-CM | POA: Diagnosis not present

## 2015-04-28 DIAGNOSIS — N184 Chronic kidney disease, stage 4 (severe): Secondary | ICD-10-CM | POA: Diagnosis not present

## 2015-04-28 DIAGNOSIS — E1165 Type 2 diabetes mellitus with hyperglycemia: Secondary | ICD-10-CM | POA: Diagnosis not present

## 2015-04-28 DIAGNOSIS — E1122 Type 2 diabetes mellitus with diabetic chronic kidney disease: Secondary | ICD-10-CM | POA: Diagnosis not present

## 2015-05-03 DIAGNOSIS — I129 Hypertensive chronic kidney disease with stage 1 through stage 4 chronic kidney disease, or unspecified chronic kidney disease: Secondary | ICD-10-CM | POA: Diagnosis not present

## 2015-05-03 DIAGNOSIS — N390 Urinary tract infection, site not specified: Secondary | ICD-10-CM | POA: Diagnosis not present

## 2015-05-03 DIAGNOSIS — I5042 Chronic combined systolic (congestive) and diastolic (congestive) heart failure: Secondary | ICD-10-CM | POA: Diagnosis not present

## 2015-05-03 DIAGNOSIS — E1122 Type 2 diabetes mellitus with diabetic chronic kidney disease: Secondary | ICD-10-CM | POA: Diagnosis not present

## 2015-05-03 DIAGNOSIS — N184 Chronic kidney disease, stage 4 (severe): Secondary | ICD-10-CM | POA: Diagnosis not present

## 2015-05-03 DIAGNOSIS — E1165 Type 2 diabetes mellitus with hyperglycemia: Secondary | ICD-10-CM | POA: Diagnosis not present

## 2015-05-06 DIAGNOSIS — E1122 Type 2 diabetes mellitus with diabetic chronic kidney disease: Secondary | ICD-10-CM | POA: Diagnosis not present

## 2015-05-06 DIAGNOSIS — I129 Hypertensive chronic kidney disease with stage 1 through stage 4 chronic kidney disease, or unspecified chronic kidney disease: Secondary | ICD-10-CM | POA: Diagnosis not present

## 2015-05-06 DIAGNOSIS — N184 Chronic kidney disease, stage 4 (severe): Secondary | ICD-10-CM | POA: Diagnosis not present

## 2015-05-06 DIAGNOSIS — I5042 Chronic combined systolic (congestive) and diastolic (congestive) heart failure: Secondary | ICD-10-CM | POA: Diagnosis not present

## 2015-05-06 DIAGNOSIS — E1165 Type 2 diabetes mellitus with hyperglycemia: Secondary | ICD-10-CM | POA: Diagnosis not present

## 2015-05-06 DIAGNOSIS — N390 Urinary tract infection, site not specified: Secondary | ICD-10-CM | POA: Diagnosis not present

## 2015-05-07 DIAGNOSIS — R269 Unspecified abnormalities of gait and mobility: Secondary | ICD-10-CM | POA: Diagnosis not present

## 2015-05-07 DIAGNOSIS — E1165 Type 2 diabetes mellitus with hyperglycemia: Secondary | ICD-10-CM | POA: Diagnosis not present

## 2015-05-07 DIAGNOSIS — I5042 Chronic combined systolic (congestive) and diastolic (congestive) heart failure: Secondary | ICD-10-CM | POA: Diagnosis not present

## 2015-05-07 DIAGNOSIS — N390 Urinary tract infection, site not specified: Secondary | ICD-10-CM | POA: Diagnosis not present

## 2015-05-07 DIAGNOSIS — E1010 Type 1 diabetes mellitus with ketoacidosis without coma: Secondary | ICD-10-CM | POA: Diagnosis not present

## 2015-05-07 DIAGNOSIS — R5381 Other malaise: Secondary | ICD-10-CM | POA: Diagnosis not present

## 2015-05-07 DIAGNOSIS — E109 Type 1 diabetes mellitus without complications: Secondary | ICD-10-CM | POA: Diagnosis not present

## 2015-05-07 DIAGNOSIS — E1122 Type 2 diabetes mellitus with diabetic chronic kidney disease: Secondary | ICD-10-CM | POA: Diagnosis not present

## 2015-05-07 DIAGNOSIS — Z681 Body mass index (BMI) 19 or less, adult: Secondary | ICD-10-CM | POA: Diagnosis not present

## 2015-05-07 DIAGNOSIS — E46 Unspecified protein-calorie malnutrition: Secondary | ICD-10-CM | POA: Diagnosis not present

## 2015-05-07 DIAGNOSIS — F329 Major depressive disorder, single episode, unspecified: Secondary | ICD-10-CM | POA: Diagnosis not present

## 2015-05-07 DIAGNOSIS — I129 Hypertensive chronic kidney disease with stage 1 through stage 4 chronic kidney disease, or unspecified chronic kidney disease: Secondary | ICD-10-CM | POA: Diagnosis not present

## 2015-05-07 DIAGNOSIS — N184 Chronic kidney disease, stage 4 (severe): Secondary | ICD-10-CM | POA: Diagnosis not present

## 2015-05-09 DIAGNOSIS — N319 Neuromuscular dysfunction of bladder, unspecified: Secondary | ICD-10-CM | POA: Diagnosis not present

## 2015-05-09 DIAGNOSIS — Z681 Body mass index (BMI) 19 or less, adult: Secondary | ICD-10-CM | POA: Diagnosis not present

## 2015-05-09 DIAGNOSIS — J449 Chronic obstructive pulmonary disease, unspecified: Secondary | ICD-10-CM | POA: Diagnosis not present

## 2015-05-09 DIAGNOSIS — E109 Type 1 diabetes mellitus without complications: Secondary | ICD-10-CM | POA: Diagnosis not present

## 2015-05-09 DIAGNOSIS — I10 Essential (primary) hypertension: Secondary | ICD-10-CM | POA: Diagnosis not present

## 2015-05-09 DIAGNOSIS — N390 Urinary tract infection, site not specified: Secondary | ICD-10-CM | POA: Diagnosis not present

## 2015-05-09 DIAGNOSIS — Z23 Encounter for immunization: Secondary | ICD-10-CM | POA: Diagnosis not present

## 2015-05-16 DIAGNOSIS — I129 Hypertensive chronic kidney disease with stage 1 through stage 4 chronic kidney disease, or unspecified chronic kidney disease: Secondary | ICD-10-CM | POA: Diagnosis not present

## 2015-05-16 DIAGNOSIS — E1122 Type 2 diabetes mellitus with diabetic chronic kidney disease: Secondary | ICD-10-CM | POA: Diagnosis not present

## 2015-05-16 DIAGNOSIS — I5042 Chronic combined systolic (congestive) and diastolic (congestive) heart failure: Secondary | ICD-10-CM | POA: Diagnosis not present

## 2015-05-16 DIAGNOSIS — E1165 Type 2 diabetes mellitus with hyperglycemia: Secondary | ICD-10-CM | POA: Diagnosis not present

## 2015-05-16 DIAGNOSIS — N390 Urinary tract infection, site not specified: Secondary | ICD-10-CM | POA: Diagnosis not present

## 2015-05-16 DIAGNOSIS — N184 Chronic kidney disease, stage 4 (severe): Secondary | ICD-10-CM | POA: Diagnosis not present

## 2015-05-21 DIAGNOSIS — N390 Urinary tract infection, site not specified: Secondary | ICD-10-CM | POA: Diagnosis not present

## 2015-05-21 DIAGNOSIS — E1122 Type 2 diabetes mellitus with diabetic chronic kidney disease: Secondary | ICD-10-CM | POA: Diagnosis not present

## 2015-05-21 DIAGNOSIS — I129 Hypertensive chronic kidney disease with stage 1 through stage 4 chronic kidney disease, or unspecified chronic kidney disease: Secondary | ICD-10-CM | POA: Diagnosis not present

## 2015-05-21 DIAGNOSIS — N184 Chronic kidney disease, stage 4 (severe): Secondary | ICD-10-CM | POA: Diagnosis not present

## 2015-05-21 DIAGNOSIS — E1165 Type 2 diabetes mellitus with hyperglycemia: Secondary | ICD-10-CM | POA: Diagnosis not present

## 2015-05-21 DIAGNOSIS — I5042 Chronic combined systolic (congestive) and diastolic (congestive) heart failure: Secondary | ICD-10-CM | POA: Diagnosis not present

## 2015-05-22 DIAGNOSIS — K625 Hemorrhage of anus and rectum: Secondary | ICD-10-CM | POA: Diagnosis not present

## 2015-05-22 DIAGNOSIS — K641 Second degree hemorrhoids: Secondary | ICD-10-CM | POA: Diagnosis not present

## 2015-05-22 DIAGNOSIS — R634 Abnormal weight loss: Secondary | ICD-10-CM | POA: Diagnosis not present

## 2015-05-29 DIAGNOSIS — R944 Abnormal results of kidney function studies: Secondary | ICD-10-CM | POA: Diagnosis not present

## 2015-05-29 DIAGNOSIS — E1010 Type 1 diabetes mellitus with ketoacidosis without coma: Secondary | ICD-10-CM | POA: Diagnosis not present

## 2015-05-29 DIAGNOSIS — R634 Abnormal weight loss: Secondary | ICD-10-CM | POA: Diagnosis not present

## 2015-05-29 DIAGNOSIS — E1165 Type 2 diabetes mellitus with hyperglycemia: Secondary | ICD-10-CM | POA: Diagnosis not present

## 2015-05-29 DIAGNOSIS — N184 Chronic kidney disease, stage 4 (severe): Secondary | ICD-10-CM | POA: Diagnosis not present

## 2015-05-29 DIAGNOSIS — I5042 Chronic combined systolic (congestive) and diastolic (congestive) heart failure: Secondary | ICD-10-CM | POA: Diagnosis not present

## 2015-05-29 DIAGNOSIS — N390 Urinary tract infection, site not specified: Secondary | ICD-10-CM | POA: Diagnosis not present

## 2015-05-29 DIAGNOSIS — I129 Hypertensive chronic kidney disease with stage 1 through stage 4 chronic kidney disease, or unspecified chronic kidney disease: Secondary | ICD-10-CM | POA: Diagnosis not present

## 2015-05-29 DIAGNOSIS — Z681 Body mass index (BMI) 19 or less, adult: Secondary | ICD-10-CM | POA: Diagnosis not present

## 2015-05-29 DIAGNOSIS — E1122 Type 2 diabetes mellitus with diabetic chronic kidney disease: Secondary | ICD-10-CM | POA: Diagnosis not present

## 2015-05-30 DIAGNOSIS — N184 Chronic kidney disease, stage 4 (severe): Secondary | ICD-10-CM | POA: Diagnosis not present

## 2015-05-30 DIAGNOSIS — N390 Urinary tract infection, site not specified: Secondary | ICD-10-CM | POA: Diagnosis not present

## 2015-05-30 DIAGNOSIS — E1165 Type 2 diabetes mellitus with hyperglycemia: Secondary | ICD-10-CM | POA: Diagnosis not present

## 2015-05-30 DIAGNOSIS — E1122 Type 2 diabetes mellitus with diabetic chronic kidney disease: Secondary | ICD-10-CM | POA: Diagnosis not present

## 2015-05-30 DIAGNOSIS — I129 Hypertensive chronic kidney disease with stage 1 through stage 4 chronic kidney disease, or unspecified chronic kidney disease: Secondary | ICD-10-CM | POA: Diagnosis not present

## 2015-05-30 DIAGNOSIS — I5042 Chronic combined systolic (congestive) and diastolic (congestive) heart failure: Secondary | ICD-10-CM | POA: Diagnosis not present

## 2015-06-03 DIAGNOSIS — N390 Urinary tract infection, site not specified: Secondary | ICD-10-CM | POA: Diagnosis not present

## 2015-06-03 DIAGNOSIS — R8299 Other abnormal findings in urine: Secondary | ICD-10-CM | POA: Diagnosis not present

## 2015-06-03 DIAGNOSIS — E1029 Type 1 diabetes mellitus with other diabetic kidney complication: Secondary | ICD-10-CM | POA: Diagnosis not present

## 2015-06-05 DIAGNOSIS — I129 Hypertensive chronic kidney disease with stage 1 through stage 4 chronic kidney disease, or unspecified chronic kidney disease: Secondary | ICD-10-CM | POA: Diagnosis not present

## 2015-06-05 DIAGNOSIS — E1122 Type 2 diabetes mellitus with diabetic chronic kidney disease: Secondary | ICD-10-CM | POA: Diagnosis not present

## 2015-06-05 DIAGNOSIS — E1165 Type 2 diabetes mellitus with hyperglycemia: Secondary | ICD-10-CM | POA: Diagnosis not present

## 2015-06-05 DIAGNOSIS — N390 Urinary tract infection, site not specified: Secondary | ICD-10-CM | POA: Diagnosis not present

## 2015-06-05 DIAGNOSIS — I5042 Chronic combined systolic (congestive) and diastolic (congestive) heart failure: Secondary | ICD-10-CM | POA: Diagnosis not present

## 2015-06-05 DIAGNOSIS — N184 Chronic kidney disease, stage 4 (severe): Secondary | ICD-10-CM | POA: Diagnosis not present

## 2015-06-12 DIAGNOSIS — K641 Second degree hemorrhoids: Secondary | ICD-10-CM | POA: Diagnosis not present

## 2015-06-18 ENCOUNTER — Inpatient Hospital Stay (HOSPITAL_COMMUNITY)
Admission: EM | Admit: 2015-06-18 | Discharge: 2015-06-22 | DRG: 871 | Disposition: A | Discharge status: Home Health | Payer: Medicare Other | Attending: Internal Medicine | Admitting: Internal Medicine

## 2015-06-18 ENCOUNTER — Encounter (HOSPITAL_COMMUNITY): Payer: Self-pay | Admitting: Emergency Medicine

## 2015-06-18 ENCOUNTER — Inpatient Hospital Stay (HOSPITAL_COMMUNITY): Payer: Medicare Other

## 2015-06-18 ENCOUNTER — Emergency Department (HOSPITAL_COMMUNITY): Payer: Medicare Other

## 2015-06-18 DIAGNOSIS — J069 Acute upper respiratory infection, unspecified: Secondary | ICD-10-CM | POA: Diagnosis present

## 2015-06-18 DIAGNOSIS — E860 Dehydration: Secondary | ICD-10-CM | POA: Diagnosis present

## 2015-06-18 DIAGNOSIS — J40 Bronchitis, not specified as acute or chronic: Secondary | ICD-10-CM | POA: Diagnosis not present

## 2015-06-18 DIAGNOSIS — N390 Urinary tract infection, site not specified: Secondary | ICD-10-CM

## 2015-06-18 DIAGNOSIS — R102 Pelvic and perineal pain: Secondary | ICD-10-CM | POA: Diagnosis not present

## 2015-06-18 DIAGNOSIS — N320 Bladder-neck obstruction: Secondary | ICD-10-CM | POA: Diagnosis present

## 2015-06-18 DIAGNOSIS — E1122 Type 2 diabetes mellitus with diabetic chronic kidney disease: Secondary | ICD-10-CM | POA: Diagnosis present

## 2015-06-18 DIAGNOSIS — A419 Sepsis, unspecified organism: Principal | ICD-10-CM

## 2015-06-18 DIAGNOSIS — B1920 Unspecified viral hepatitis C without hepatic coma: Secondary | ICD-10-CM | POA: Diagnosis present

## 2015-06-18 DIAGNOSIS — G9341 Metabolic encephalopathy: Secondary | ICD-10-CM | POA: Diagnosis present

## 2015-06-18 DIAGNOSIS — E785 Hyperlipidemia, unspecified: Secondary | ICD-10-CM | POA: Diagnosis present

## 2015-06-18 DIAGNOSIS — I130 Hypertensive heart and chronic kidney disease with heart failure and stage 1 through stage 4 chronic kidney disease, or unspecified chronic kidney disease: Secondary | ICD-10-CM | POA: Diagnosis present

## 2015-06-18 DIAGNOSIS — J45909 Unspecified asthma, uncomplicated: Secondary | ICD-10-CM | POA: Diagnosis present

## 2015-06-18 DIAGNOSIS — R609 Edema, unspecified: Secondary | ICD-10-CM | POA: Diagnosis not present

## 2015-06-18 DIAGNOSIS — M810 Age-related osteoporosis without current pathological fracture: Secondary | ICD-10-CM | POA: Diagnosis present

## 2015-06-18 DIAGNOSIS — R404 Transient alteration of awareness: Secondary | ICD-10-CM | POA: Diagnosis not present

## 2015-06-18 DIAGNOSIS — N179 Acute kidney failure, unspecified: Secondary | ICD-10-CM

## 2015-06-18 DIAGNOSIS — Z87891 Personal history of nicotine dependence: Secondary | ICD-10-CM | POA: Diagnosis not present

## 2015-06-18 DIAGNOSIS — Z681 Body mass index (BMI) 19 or less, adult: Secondary | ICD-10-CM

## 2015-06-18 DIAGNOSIS — R410 Disorientation, unspecified: Secondary | ICD-10-CM

## 2015-06-18 DIAGNOSIS — J189 Pneumonia, unspecified organism: Secondary | ICD-10-CM | POA: Diagnosis present

## 2015-06-18 DIAGNOSIS — Z794 Long term (current) use of insulin: Secondary | ICD-10-CM | POA: Diagnosis not present

## 2015-06-18 DIAGNOSIS — K5900 Constipation, unspecified: Secondary | ICD-10-CM | POA: Diagnosis present

## 2015-06-18 DIAGNOSIS — R627 Adult failure to thrive: Secondary | ICD-10-CM | POA: Diagnosis present

## 2015-06-18 DIAGNOSIS — I5042 Chronic combined systolic (congestive) and diastolic (congestive) heart failure: Secondary | ICD-10-CM | POA: Diagnosis present

## 2015-06-18 DIAGNOSIS — E291 Testicular hypofunction: Secondary | ICD-10-CM | POA: Diagnosis present

## 2015-06-18 DIAGNOSIS — J449 Chronic obstructive pulmonary disease, unspecified: Secondary | ICD-10-CM | POA: Diagnosis not present

## 2015-06-18 DIAGNOSIS — W19XXXA Unspecified fall, initial encounter: Secondary | ICD-10-CM

## 2015-06-18 DIAGNOSIS — Z881 Allergy status to other antibiotic agents status: Secondary | ICD-10-CM | POA: Diagnosis not present

## 2015-06-18 DIAGNOSIS — E1165 Type 2 diabetes mellitus with hyperglycemia: Secondary | ICD-10-CM | POA: Diagnosis present

## 2015-06-18 DIAGNOSIS — N184 Chronic kidney disease, stage 4 (severe): Secondary | ICD-10-CM

## 2015-06-18 DIAGNOSIS — Z885 Allergy status to narcotic agent status: Secondary | ICD-10-CM

## 2015-06-18 DIAGNOSIS — F17210 Nicotine dependence, cigarettes, uncomplicated: Secondary | ICD-10-CM | POA: Diagnosis not present

## 2015-06-18 DIAGNOSIS — J440 Chronic obstructive pulmonary disease with acute lower respiratory infection: Secondary | ICD-10-CM | POA: Diagnosis present

## 2015-06-18 DIAGNOSIS — N319 Neuromuscular dysfunction of bladder, unspecified: Secondary | ICD-10-CM | POA: Diagnosis present

## 2015-06-18 DIAGNOSIS — R011 Cardiac murmur, unspecified: Secondary | ICD-10-CM | POA: Diagnosis not present

## 2015-06-18 DIAGNOSIS — I7090 Unspecified atherosclerosis: Secondary | ICD-10-CM | POA: Diagnosis not present

## 2015-06-18 DIAGNOSIS — R531 Weakness: Secondary | ICD-10-CM | POA: Diagnosis not present

## 2015-06-18 DIAGNOSIS — E440 Moderate protein-calorie malnutrition: Secondary | ICD-10-CM

## 2015-06-18 DIAGNOSIS — E43 Unspecified severe protein-calorie malnutrition: Secondary | ICD-10-CM

## 2015-06-18 DIAGNOSIS — I5040 Unspecified combined systolic (congestive) and diastolic (congestive) heart failure: Secondary | ICD-10-CM | POA: Diagnosis present

## 2015-06-18 LAB — CBC WITH DIFFERENTIAL/PLATELET
BASOS PCT: 1 %
Basophils Absolute: 0 10*3/uL (ref 0.0–0.1)
Eosinophils Absolute: 0 10*3/uL (ref 0.0–0.7)
Eosinophils Relative: 1 %
HEMATOCRIT: 39.5 % (ref 39.0–52.0)
HEMOGLOBIN: 13.1 g/dL (ref 13.0–17.0)
LYMPHS ABS: 0.9 10*3/uL (ref 0.7–4.0)
Lymphocytes Relative: 19 %
MCH: 31.3 pg (ref 26.0–34.0)
MCHC: 33.2 g/dL (ref 30.0–36.0)
MCV: 94.5 fL (ref 78.0–100.0)
MONOS PCT: 32 %
Monocytes Absolute: 1.5 10*3/uL — ABNORMAL HIGH (ref 0.1–1.0)
NEUTROS ABS: 2.3 10*3/uL (ref 1.7–7.7)
NEUTROS PCT: 47 %
Platelets: 357 10*3/uL (ref 150–400)
RBC: 4.18 MIL/uL — AB (ref 4.22–5.81)
RDW: 14.3 % (ref 11.5–15.5)
WBC: 4.7 10*3/uL (ref 4.0–10.5)

## 2015-06-18 LAB — URINE MICROSCOPIC-ADD ON: RBC / HPF: NONE SEEN RBC/hpf (ref 0–5)

## 2015-06-18 LAB — I-STAT CG4 LACTIC ACID, ED
LACTIC ACID, VENOUS: 0.63 mmol/L (ref 0.5–2.0)
Lactic Acid, Venous: 2 mmol/L (ref 0.5–2.0)

## 2015-06-18 LAB — CREATININE, URINE, RANDOM: CREATININE, URINE: 32.05 mg/dL

## 2015-06-18 LAB — SODIUM, URINE, RANDOM: Sodium, Ur: 69 mmol/L

## 2015-06-18 LAB — PROTIME-INR
INR: 1.08 (ref 0.00–1.49)
PROTHROMBIN TIME: 14.2 s (ref 11.6–15.2)

## 2015-06-18 LAB — URINALYSIS, ROUTINE W REFLEX MICROSCOPIC
BILIRUBIN URINE: NEGATIVE
GLUCOSE, UA: NEGATIVE mg/dL
KETONES UR: NEGATIVE mg/dL
Nitrite: NEGATIVE
PROTEIN: 100 mg/dL — AB
Specific Gravity, Urine: 1.013 (ref 1.005–1.030)
pH: 7.5 (ref 5.0–8.0)

## 2015-06-18 LAB — COMPREHENSIVE METABOLIC PANEL
ALT: 18 U/L (ref 17–63)
ANION GAP: 13 (ref 5–15)
AST: 50 U/L — ABNORMAL HIGH (ref 15–41)
Albumin: 3.9 g/dL (ref 3.5–5.0)
Alkaline Phosphatase: 51 U/L (ref 38–126)
BUN: 59 mg/dL — ABNORMAL HIGH (ref 6–20)
CALCIUM: 8.9 mg/dL (ref 8.9–10.3)
CHLORIDE: 105 mmol/L (ref 101–111)
CO2: 20 mmol/L — AB (ref 22–32)
Creatinine, Ser: 2.59 mg/dL — ABNORMAL HIGH (ref 0.61–1.24)
GFR, EST AFRICAN AMERICAN: 25 mL/min — AB (ref >60)
GFR, EST NON AFRICAN AMERICAN: 21 mL/min — AB (ref >60)
Glucose, Bld: 71 mg/dL (ref 65–99)
Potassium: 4.6 mmol/L (ref 3.5–5.1)
SODIUM: 138 mmol/L (ref 135–145)
Total Bilirubin: 0.6 mg/dL (ref 0.3–1.2)
Total Protein: 7.6 g/dL (ref 6.5–8.1)

## 2015-06-18 MED ORDER — SODIUM CHLORIDE 0.9 % IV BOLUS (SEPSIS)
1000.0000 mL | Freq: Once | INTRAVENOUS | Status: AC
  Administered 2015-06-18: 1000 mL via INTRAVENOUS

## 2015-06-18 MED ORDER — SODIUM CHLORIDE 0.9 % IV BOLUS (SEPSIS)
500.0000 mL | Freq: Once | INTRAVENOUS | Status: AC
  Administered 2015-06-18: 500 mL via INTRAVENOUS

## 2015-06-18 MED ORDER — VITAMIN D3 25 MCG (1000 UNIT) PO TABS
1000.0000 [IU] | ORAL_TABLET | Freq: Every day | ORAL | Status: DC
  Administered 2015-06-18 – 2015-06-22 (×5): 1000 [IU] via ORAL
  Filled 2015-06-18 (×5): qty 1

## 2015-06-18 MED ORDER — SODIUM CHLORIDE 0.9 % IV SOLN
INTRAVENOUS | Status: AC
  Administered 2015-06-18: 23:00:00 via INTRAVENOUS

## 2015-06-18 MED ORDER — HEPARIN SODIUM (PORCINE) 5000 UNIT/ML IJ SOLN
5000.0000 [IU] | Freq: Three times a day (TID) | INTRAMUSCULAR | Status: DC
  Administered 2015-06-18 – 2015-06-22 (×11): 5000 [IU] via SUBCUTANEOUS
  Filled 2015-06-18 (×12): qty 1

## 2015-06-18 MED ORDER — INSULIN ASPART 100 UNIT/ML ~~LOC~~ SOLN
0.0000 [IU] | Freq: Three times a day (TID) | SUBCUTANEOUS | Status: DC
  Administered 2015-06-19: 9 [IU] via SUBCUTANEOUS

## 2015-06-18 MED ORDER — ALBUTEROL SULFATE (5 MG/ML) 0.5% IN NEBU: 5.0000 mg | INHALATION_SOLUTION | RESPIRATORY_TRACT | Status: DC | PRN

## 2015-06-18 MED ORDER — DEXTROSE 5 % IV SOLN
1.0000 g | Freq: Once | INTRAVENOUS | Status: AC
  Administered 2015-06-18: 1 g via INTRAVENOUS
  Filled 2015-06-18: qty 10

## 2015-06-18 MED ORDER — ACETAMINOPHEN 500 MG PO TABS
1000.0000 mg | ORAL_TABLET | Freq: Once | ORAL | Status: DC
  Filled 2015-06-18: qty 2

## 2015-06-18 MED ORDER — ONDANSETRON HCL 4 MG/2ML IJ SOLN: 4.0000 mg | Freq: Three times a day (TID) | INTRAMUSCULAR | Status: AC | PRN

## 2015-06-18 MED ORDER — TIOTROPIUM BROMIDE-OLODATEROL 2.5-2.5 MCG/ACT IN AERS: 1.0000 | INHALATION_SPRAY | Freq: Every day | RESPIRATORY_TRACT | Status: DC | PRN

## 2015-06-18 MED ORDER — ALBUTEROL SULFATE (2.5 MG/3ML) 0.083% IN NEBU
2.5000 mg | INHALATION_SOLUTION | RESPIRATORY_TRACT | Status: DC | PRN
  Administered 2015-06-22: 2.5 mg via RESPIRATORY_TRACT
  Filled 2015-06-18: qty 3

## 2015-06-18 MED ORDER — SIMVASTATIN 40 MG PO TABS
40.0000 mg | ORAL_TABLET | Freq: Every day | ORAL | Status: DC
  Administered 2015-06-18 – 2015-06-21 (×4): 40 mg via ORAL
  Filled 2015-06-18 (×5): qty 1

## 2015-06-18 MED ORDER — DEXTROSE 5 % IV SOLN
500.0000 mg | INTRAVENOUS | Status: DC
  Administered 2015-06-18 – 2015-06-20 (×3): 500 mg via INTRAVENOUS
  Filled 2015-06-18 (×3): qty 500

## 2015-06-18 MED ORDER — CEFTRIAXONE SODIUM 1 G IJ SOLR
1.0000 g | INTRAMUSCULAR | Status: DC
  Administered 2015-06-19 – 2015-06-20 (×2): 1 g via INTRAVENOUS
  Filled 2015-06-18 (×2): qty 10

## 2015-06-18 MED ORDER — ENSURE ENLIVE PO LIQD
237.0000 mL | Freq: Two times a day (BID) | ORAL | Status: DC
  Administered 2015-06-19: 237 mL via ORAL

## 2015-06-18 MED ORDER — ACETAMINOPHEN 325 MG PO TABS
650.0000 mg | ORAL_TABLET | Freq: Once | ORAL | Status: AC
  Administered 2015-06-18: 650 mg via ORAL
  Filled 2015-06-18: qty 2

## 2015-06-18 MED ORDER — VANCOMYCIN HCL IN DEXTROSE 1-5 GM/200ML-% IV SOLN
1000.0000 mg | Freq: Once | INTRAVENOUS | Status: AC
  Administered 2015-06-18: 1000 mg via INTRAVENOUS
  Filled 2015-06-18: qty 200

## 2015-06-18 MED ORDER — URELLE 81 MG PO TABS
1.0000 | ORAL_TABLET | Freq: Two times a day (BID) | ORAL | Status: DC
  Administered 2015-06-18 – 2015-06-22 (×8): 81 mg via ORAL
  Filled 2015-06-18 (×8): qty 1

## 2015-06-18 MED ORDER — INSULIN GLARGINE 100 UNIT/ML ~~LOC~~ SOLN
10.0000 [IU] | Freq: Every day | SUBCUTANEOUS | Status: DC
  Administered 2015-06-18 – 2015-06-19 (×2): 10 [IU] via SUBCUTANEOUS
  Filled 2015-06-18 (×2): qty 0.1

## 2015-06-18 MED ORDER — URIBEL 118 MG PO CAPS: 1.0000 | ORAL_CAPSULE | Freq: Two times a day (BID) | ORAL | Status: DC

## 2015-06-18 MED ORDER — ZOLPIDEM TARTRATE 10 MG PO TABS: 10.0000 mg | ORAL_TABLET | Freq: Every evening | ORAL | Status: DC | PRN

## 2015-06-18 NOTE — ED Provider Notes (Signed)
CSN: [id number]     Arrival date & time 06/18/15  1604 History   First MD Initiated Contact with Patient 06/18/15 1610     Chief Complaint  Patient presents with  . fall/ weakness      (Consider location/radiation/quality/duration/timing/severity/associated sxs/prior Treatment) HPI Comments: The patient is an 81 year old male, he has a history of diabetes, asthma, hydronephrosis and stage III chronic kidney disease, he has a neurogenic bladder, recurrent urinary tract infections with known ongoing pseudomonas, hepatitis C, COPD and combined heart failure. He presents from home after he had a fall, he was walking with his walker, he does not know exactly why he fell but states it is due to generalized weakness. He was unable to get up off the floor. Paramedics report that this fall was at home, he was ambulating to the kitchen, the patient does report that he self caths and reports that today he had some bright red blood in his urine. The patient denies any pain after the fall including head injury, neck pain, back pain or extremity pain. The patient denies any other symptoms other than feeling generally weak. This was acute in onset, just prior to arrival, severe  The history is provided by the patient.    Past Medical History  Diagnosis Date  . Diabetes mellitus   . Asthma   . Hydronephrosis   . Kidney disease     Stage III chronic kidney disease  . Hypertension   . Neurogenic bladder   . Osteoporosis   . Hypogonadism male   . Recurrent urinary tract infection     With resistent Pseudomonas  . Allergic rhinitis   . Hepatitis C     due to blood transfusion  . Hyperlipidemia   . SOB (shortness of breath)   . CKD (chronic kidney disease), stage III   . Combined systolic and diastolic congestive heart failure (HCC)   . COPD (chronic obstructive pulmonary disease) (HCC)   . Allergy   . Cataract   . Heart murmur    Past Surgical History  Procedure Laterality Date  . Hernia  repair    . Tonsillectomy    . Bladder repair      obstruction surgery  . Frontal sinus obliteration      sinus repaired with a plate  . Cataract extraction  2015  . Eye surgery    . Fracture surgery    . Prostate surgery    . Spine surgery     Family History  Problem Relation Age of Onset  . Heart failure Mother   . Hypertension Mother   . COPD Father     smoked  . Diabetes Father   . Cancer Sister     Breast  . Diabetes Sister   . COPD Brother     smoked   Social History  Substance Use Topics  . Smoking status: Former Smoker    Types: Cigars, Cigarettes    Quit date: 07/21/2011  . Smokeless tobacco: None     Comment: smoked occ cigar and pipe  . Alcohol Use: No    Review of Systems  All other systems reviewed and are negative.     Allergies  Codeine; Gentamicin; and Ciprofloxacin  Home Medications   Prior to Admission medications   Medication Sig Start Date End Date Taking? Authorizing Provider  albuterol (2.5 MG/3ML) 0.083% NEBU 3 mL, albuterol (5 MG/ML) 0.5% NEBU 0.5 mL Inhale 5 mg into the lungs every 6 (six) hours as needed (wheezing).  Yes Historical Provider, MD  albuterol (PROVENTIL,VENTOLIN) 90 MCG/ACT inhaler Inhale 2 puffs into the lungs every 4 (four) hours as needed for wheezing or shortness of breath.    Yes Historical Provider, MD  cholecalciferol (VITAMIN D) 1000 UNITS tablet Take 1,000 Units by mouth daily.   Yes Historical Provider, MD  Insulin Glargine (TOUJEO SOLOSTAR) 300 UNIT/ML SOPN Inject 14 Units into the skin at bedtime.   Yes Historical Provider, MD  insulin lispro (HUMALOG) 100 UNIT/ML injection Inject 4-16 Units into the skin 3 (three) times daily as needed for high blood sugar (high blood sugar). Injects 8 units at breakfast, 4 units lunch, 8 to 10 units at dinner  Adjusted to blood glucose level. Patient adds an additional unit for every /dL   Yes Historical Provider, MD  Meth-Hyo-M Bl-Na Phos-Ph Sal (URIBEL) 118 MG CAPS Take  1 capsule by mouth 2 (two) times daily.   Yes Historical Provider, MD  simvastatin (ZOCOR) 40 MG tablet Take 40 mg by mouth daily.     Yes Historical Provider, MD  Tiotropium Bromide-Olodaterol (STIOLTO RESPIMAT) 2.5-2.5 MCG/ACT AERS Inhale 2 puffs into the lungs daily. Patient taking differently: Inhale 1 puff into the lungs daily as needed (for shortness of breath).  03/27/15  Yes Nyoka Cowden, MD  zolpidem (AMBIEN) 10 MG tablet Take 1 tablet by mouth at bedtime as needed. 05/09/15  Yes Historical Provider, MD  saccharomyces boulardii (FLORASTOR) 250 MG capsule Take 1 capsule (250 mg total) by mouth 2 (two) times daily. Patient not taking: Reported on 04/06/2015 01/26/15   Alison Murray, MD   BP 121/71 mmHg  Pulse 72  Temp(Src) 98.2 F (36.8 C) (Oral)  Resp 20  Ht  (1.727 m)  Wt 128 lb (58.06 kg)  BMI 19.47 kg/m2  SpO2 100% Physical Exam  Constitutional: He appears well-developed and well-nourished. No distress.  HENT:  Head: Normocephalic and atraumatic.  Mouth/Throat: Oropharynx is clear and moist. No oropharyngeal exudate.  Eyes: Conjunctivae and EOM are normal. Pupils are equal, round, and reactive to light. Right eye exhibits no discharge. Left eye exhibits no discharge. No scleral icterus.  Neck: Normal range of motion. Neck supple. No JVD present. No thyromegaly present.  Cardiovascular: Normal rate, regular rhythm, normal heart sounds and intact distal pulses.  Exam reveals no gallop and no friction rub.   No murmur heard. Weak pulses at the radial arteries but no tachycardia, no JVD, no peripheral edema  Pulmonary/Chest: Effort normal and breath sounds normal. No respiratory distress. He has no wheezes. He has no rales.  Normal work of breathing, no respiratory distress, speaks in full sentences  Abdominal: Soft. Bowel sounds are normal. He exhibits no distension and no mass. There is no tenderness.  No abdominal tenderness to palpation, no masses  Musculoskeletal: Normal  range of motion. He exhibits no edema or tenderness.  Moves all 4 extremities to command, able to straight leg raise, soft compartments joints diffusely, no spinal tenderness  Lymphadenopathy:    He has no cervical adenopathy.  Neurological: He is alert. Coordination normal.  Speech is clear, the patient is alert and oriented, he follows commands without difficulty with normal range of motion but has decreased strength globally. Coordination appears grossly normal  Skin: Skin is warm and dry. No rash noted. No erythema.  Abrasion along the left forearm  Psychiatric: He has a normal mood and affect. His behavior is normal.  Nursing note and vitals reviewed.   ED Course  Procedures (including  critical care time) Labs Review Labs Reviewed  COMPREHENSIVE METABOLIC PANEL - Abnormal; Notable for the following:    CO2 20 (*)    BUN 59 (*)    Creatinine, Ser 2.59 (*)    AST 50 (*)    GFR calc non Af Amer 21 (*)    GFR calc Af Amer 25 (*)    All other components within normal limits  CBC WITH DIFFERENTIAL/PLATELET - Abnormal; Notable for the following:    RBC 4.18 (*)    Monocytes Absolute 1.5 (*)    All other components within normal limits  URINALYSIS, ROUTINE W REFLEX MICROSCOPIC (NOT AT [facility]) - Abnormal; Notable for the following:    APPearance TURBID (*)    Hgb urine dipstick LARGE (*)    Protein, ur 100 (*)    Leukocytes, UA MODERATE (*)    All other components within normal limits  URINE MICROSCOPIC-ADD ON - Abnormal; Notable for the following:    Squamous Epithelial / LPF 0-5 (*)    Bacteria, UA MANY (*)    All other components within normal limits  CULTURE, BLOOD (ROUTINE X 2)  CULTURE, BLOOD (ROUTINE X 2)  URINE CULTURE  I-STAT CG4 LACTIC ACID, ED  I-STAT CG4 LACTIC ACID, ED    Imaging Review Dg Pelvis Portable  06/18/2015  CLINICAL DATA:  Larey Seat today, no pelvic pain EXAM: PORTABLE PELVIS 1-2 VIEWS COMPARISON:  None. FINDINGS: There is no evidence of pelvic fracture or  diastasis. No pelvic bone lesions are seen. IMPRESSION: Negative. Electronically Signed   By: Esperanza Heir M.D.   On: 06/18/2015 17:41   Dg Chest Port 1 View  06/18/2015  CLINICAL DATA:  81 year old male who presenting with sepsis. History of asthma. EXAM: PORTABLE CHEST 1 VIEW COMPARISON:  Chest x-ray 04/23/2015. FINDINGS: Diffuse peribronchial cuffing and hazy opacities most evident in the mid to upper lungs bilaterally. No pleural effusions. No evidence of pulmonary edema. Heart size is normal. Upper mediastinal contours are within normal limits. Atherosclerotic calcifications in the thoracic aorta. IMPRESSION: 1. Appearance of the chest suggests bronchitis, potentially with developing multilobar bronchopneumonia most evident the mid to upper lungs, as above. 2. Atherosclerosis. Electronically Signed   By: Trudie Reed M.D.   On: 06/18/2015 17:48   I have personally reviewed and evaluated these images and lab results as part of my medical decision-making.    MDM   Final diagnoses:  UTI (lower urinary tract infection)  CAP (community acquired pneumonia)  Acute renal failure, unspecified acute renal failure type (HCC)    The patient has vital signs which are concerning for sepsis, heart rate is normal, blood pressure appears normal, temperature of 102.2 by rectal route. With recurrent urinary infections, neurogenic bladder suspect this is the source. Labs, fluids, anticipate admission. I do not see any signs of trauma to suggest need for imaging at this time.  The patient has multifocal pneumonia, urinary tract infection and acute on chronic renal failure. His vital signs remained in a normal range despite the fever, it has defervesced. Antibiotics ordered, discussed with the hospitalist who will admit.  Meds given in ED:  Medications  acetaminophen (TYLENOL) tablet 1,000 mg (0 mg Oral Hold 06/18/15 1653)  vancomycin (VANCOCIN) IVPB 1000 mg/200 mL premix (1,000 mg Intravenous New  Bag/Given 06/18/15 2003)  acetaminophen (TYLENOL) tablet 650 mg (650 mg Oral Given 06/18/15 1623)  sodium chloride 0.9 % bolus 1,000 mL (0 mLs Intravenous Stopped 06/18/15 1833)  sodium chloride 0.9 % bolus 500 mL (  0 mLs Intravenous Stopped 06/18/15 1833)  cefTRIAXone (ROCEPHIN) 1 g in dextrose 5 % 50 mL IVPB (0 g Intravenous Stopped 06/18/15 1913)      Eber HongBrian Allen Egerton, MD 06/18/15 2010

## 2015-06-18 NOTE — ED Notes (Signed)
MD at bedside. 

## 2015-06-18 NOTE — ED Notes (Addendum)
Per [facility] pt from home had a fall today when ambulating to kitchen with walker assistance , co gl weakness. Pt self cath and reports that today he noticed bright blood with clots coming out . Pt denies abd pain nor dysuria. Pt alert and oriented x 4. Pt denies injury post fall, no neck pain nor back tenderness. Pt denies near syncope nor dizziness prior to fall. Per ems pt had unremarkable EKG .

## 2015-06-18 NOTE — ED Notes (Signed)
Pt's wife is concerned about the amount of urine that was obtained from the self-cath.  Will evaluate w/ bladder scanner.

## 2015-06-18 NOTE — ED Notes (Signed)
Notified Dr. Hyacinth MeekerMiller of bladder scan. Nurse aware. Pt. bladder scan was 872 ml.

## 2015-06-18 NOTE — H&P (Addendum)
[facility] History and Physical  Mathieu Schloemer WUJ:[id number] DOB: [birth year] DOA: 06/18/2015  Referring physician: ED physician PCP: Ezequiel Kayser, MD  Specialists:   Chief Complaint: Generalized weakness, fall and fever  HPI: Benjamin Riddle is a 81 y.o. male with PMH of I's and but had often late obstruction, neurogenic bladder with self cath, hypertension, hyperlipidemia, diabetes mellitus, COPD, asthma, osteoporosis, hypogonadism, HCV, combined systolic and diastolic congestive heart failure, CKD-IV, who presents with generalized weakness, fever and fall.  Patient reports that he has been having generalized weakness in the past several days. He has poor appetite and decreased energy. He lost approximately 20-25 pounds over 3 month period of time. He had fall today when ambulating to kitchen with walker assistance. No loss of consciousness, chest pain, dizziness, lightheadedness. No injury. Patient reports that he noticed blood clot in his urinary history. He is doing self cath, denies dysuria, burning on urination. Patient does not have cough, shortness of breath, abdominal pain, diarrhea, unilateral weakness, rashes, hematochezia, hematemesis.  In ED, patient was found to have positive urinalysis with moderate amount of leukocytes, WBC 4.7, lactate 2.0 --> 0.63, temperature 102.2, no tachycardia, mildly tachypnea, worsening renal function. Chest x-ray showed possible bronchitis, potentially with developing multilobar bronchopneumonia most evident the mid to upper lungs.   here does patient live?   At home    Can patient participate in ADLs?   Little  Review of Systems:   General: has fevers, chills, no changes in body weight, has poor appetite, has fatigue HEENT: no blurry vision, hearing changes or sore throat Pulm: no dyspnea, coughing, wheezing CV: no chest pain, palpitations Abd: no nausea, vomiting, abdominal pain, diarrhea, constipation GU: no dysuria, burning on  urination, increased urinary frequency, has hematuria  Ext: no leg edema Neuro: no unilateral weakness, numbness, or tingling, no vision change or hearing loss Skin: no rash MSK: No muscle spasm, no deformity, no limitation of range of movement in spin Heme: No easy bruising.  Travel history: No recent long distant travel.  Allergy:  Allergies  Allergen Reactions  . Codeine Nausea And Vomiting  . Gentamicin     Becomes dizzy and weak  . Ciprofloxacin Itching and Rash    Past Medical History  Diagnosis Date  . Diabetes mellitus   . Asthma   . Hydronephrosis   . Kidney disease     Stage III chronic kidney disease  . Hypertension   . Neurogenic bladder   . Osteoporosis   . Hypogonadism male   . Recurrent urinary tract infection     With resistent Pseudomonas  . Allergic rhinitis   . Hepatitis C     due to blood transfusion  . Hyperlipidemia   . SOB (shortness of breath)   . CKD (chronic kidney disease), stage III   . Combined systolic and diastolic congestive heart failure (HCC)   . COPD (chronic obstructive pulmonary disease) (HCC)   . Allergy   . Cataract   . Heart murmur     Past Surgical History  Procedure Laterality Date  . Hernia repair    . Tonsillectomy    . Bladder repair      obstruction surgery  . Frontal sinus obliteration      sinus repaired with a plate  . Cataract extraction  2015  . Eye surgery    . Fracture surgery    . Prostate surgery    . Spine surgery      Social History:  reports that he quit  smoking about 3 years ago. His smoking use included Cigars and Cigarettes. He does not have any smokeless tobacco history on file. He reports that he does not drink alcohol or use illicit drugs.  Family History:  Family History  Problem Relation Age of Onset  . Heart failure Mother   . Hypertension Mother   . COPD Father     smoked  . Diabetes Father   . Cancer Sister     Breast  . Diabetes Sister   . COPD Brother     smoked     Prior  to Admission medications   Medication Sig Start Date End Date Taking? Authorizing Provider  albuterol (2.5 MG/3ML) 0.083% NEBU 3 mL, albuterol (5 MG/ML) 0.5% NEBU 0.5 mL Inhale 5 mg into the lungs every 6 (six) hours as needed (wheezing).   Yes Historical Provider, MD  albuterol (PROVENTIL,VENTOLIN) 90 MCG/ACT inhaler Inhale 2 puffs into the lungs every 4 (four) hours as needed for wheezing or shortness of breath.    Yes Historical Provider, MD  cholecalciferol (VITAMIN D) 1000 UNITS tablet Take 1,000 Units by mouth daily.   Yes Historical Provider, MD  Insulin Glargine (TOUJEO SOLOSTAR) 300 UNIT/ML SOPN Inject 14 Units into the skin at bedtime.   Yes Historical Provider, MD  insulin lispro (HUMALOG) 100 UNIT/ML injection Inject 4-16 Units into the skin 3 (three) times daily as needed for high blood sugar (high blood sugar). Injects 8 units at breakfast, 4 units lunch, 8 to 10 units at dinner  Adjusted to blood glucose level. Patient adds an additional unit for every 50mg /dL   Yes Historical Provider, MD  Meth-Hyo-M Bl-Na Phos-Ph Sal (URIBEL) 118 MG CAPS Take 1 capsule by mouth 2 (two) times daily.   Yes Historical Provider, MD  simvastatin (ZOCOR) 40 MG tablet Take 40 mg by mouth daily.     Yes Historical Provider, MD  Tiotropium Bromide-Olodaterol (STIOLTO RESPIMAT) 2.5-2.5 MCG/ACT AERS Inhale 2 puffs into the lungs daily. Patient taking differently: Inhale 1 puff into the lungs daily as needed (for shortness of breath).  03/27/15  Yes Nyoka CowdenMichael B Wert, MD  zolpidem (AMBIEN) 10 MG tablet Take 1 tablet by mouth at bedtime as needed. 05/09/15  Yes Historical Provider, MD  saccharomyces boulardii (FLORASTOR) 250 MG capsule Take 1 capsule (250 mg total) by mouth 2 (two) times daily. Patient not taking: Reported on 04/06/2015 01/26/15   Alison MurrayAlma M Devine, MD    Physical Exam: Filed Vitals:   06/18/15 1619 06/18/15 1620 06/18/15 1721 06/18/15 1827  BP:    121/71  Pulse:    72  Temp: 102.2 F (39 C) 102.2 F  (39 C)  98.2 F (36.8 C)  TempSrc: Rectal Rectal  Oral  Resp:    20  Height:   5\' 8"  (1.727 m)   Weight:   58.06 kg (128 lb)   SpO2:    100%   General: Not in acute distress. Cachectic appearance  HEENT:       Eyes: PERRL, EOMI, no scleral icterus.       ENT: No discharge from the ears and nose, no pharynx injection, no tonsillar enlargement.        Neck: No JVD, no bruit, no mass felt. Heme: No neck lymph node enlargement. Cardiac: S1/S2, RRR, No murmurs, No gallops or rubs. Pulm: No rales, wheezing, rhonchi or rubs. Abd: Soft, nondistended, nontender, no rebound pain, no organomegaly, BS present. Ext: No pitting leg edema bilaterally. 2+DP/PT pulse bilaterally. Musculoskeletal: No joint  deformities, No joint redness or warmth, no limitation of ROM in spin. Skin: No rashes.  Neuro: Alert, oriented X3, cranial nerves II-XII grossly intact, muscle strength 4/5 in all extremities, sensation to light touch intact.  Psych: Patient is not psychotic, no suicidal or hemocidal ideation.  Labs on Admission:  Basic Metabolic Panel:  Recent Labs Lab 06/18/15 1635  NA 138  K 4.6  CL 105  CO2 20*  GLUCOSE 71  BUN 59*  CREATININE 2.59*  CALCIUM 8.9   Liver Function Tests:  Recent Labs Lab 06/18/15 1635  AST 50*  ALT 18  ALKPHOS 51  BILITOT 0.6  PROT 7.6  ALBUMIN 3.9   No results for input(s): LIPASE, AMYLASE in the last 168 hours. No results for input(s): AMMONIA in the last 168 hours. CBC:  Recent Labs Lab 06/18/15 1635  WBC 4.7  NEUTROABS 2.3  HGB 13.1  HCT 39.5  MCV 94.5  PLT 357   Cardiac Enzymes: No results for input(s): CKTOTAL, CKMB, CKMBINDEX, TROPONINI in the last 168 hours.  BNP (last 3 results) No results for input(s): BNP in the last 8760 hours.  ProBNP (last 3 results) No results for input(s): PROBNP in the last 8760 hours.  CBG: No results for input(s): GLUCAP in the last 168 hours.  Radiological Exams on Admission: Dg Pelvis  Portable  06/18/2015  CLINICAL DATA:  Larey Seat today, no pelvic pain EXAM: PORTABLE PELVIS 1-2 VIEWS COMPARISON:  None. FINDINGS: There is no evidence of pelvic fracture or diastasis. No pelvic bone lesions are seen. IMPRESSION: Negative. Electronically Signed   By: Esperanza Heir M.D.   On: 06/18/2015 17:41   Dg Chest Port 1 View  06/18/2015  CLINICAL DATA:  81 year old male who presenting with sepsis. History of asthma. EXAM: PORTABLE CHEST 1 VIEW COMPARISON:  Chest x-ray 04/23/2015. FINDINGS: Diffuse peribronchial cuffing and hazy opacities most evident in the mid to upper lungs bilaterally. No pleural effusions. No evidence of pulmonary edema. Heart size is normal. Upper mediastinal contours are within normal limits. Atherosclerotic calcifications in the thoracic aorta. IMPRESSION: 1. Appearance of the chest suggests bronchitis, potentially with developing multilobar bronchopneumonia most evident the mid to upper lungs, as above. 2. Atherosclerosis. Electronically Signed   By: Trudie Reed M.D.   On: 06/18/2015 17:48    EKG: Not done in ED, will get one.   Assessment/Plan Principal Problem:   UTI (lower urinary tract infection) Active Problems:   HLD (hyperlipidemia)   Asthmatic bronchitis , chronic (HCC)   COPD GOLD III    Combined systolic and diastolic congestive heart failure (HCC)   Acute renal failure superimposed on stage 4 chronic kidney disease (HCC)   Protein-calorie malnutrition, severe (HCC)   Bladder outlet obstruction   URI (upper respiratory infection)   Bronchitis   Sepsis (HCC)   CAP (community acquired pneumonia)  UTI and sepsis: UA is positive for UTI. Patient meets criteria for sepsis with fever and tachypnea. His lactate 2.00-->0.63. Hemodynamically stable. Self cath is the triggering factor. This is recurrent issue. Previously urine culture on 01/23/15 showed enterococcus which was pansensitive.  - will admit to tele bed - will start IV Ceftriaxone and  azithromycin to cover both UTI and bronchitis. - Follow up results of urine and blood cx and amend antibiotic regimen if needed per sensitivity results - prn Zofran for nausea - will get Procalcitonin and trend lactic acid levels per sepsis protocol. - IVF: 1.5L of NS bolus in ED, followed by 100 cc/h (patient has  a congestive heart failure, limiting aggressive IV fluids treatment).  AoCKD-III: Baseline Cre is 1.7, his Cre 2.59 and a BUN 59  is on admission. Likely due to prerenal secondary to dehydration. He has risk for possible hydroureter due to urinary retention from neurogenic bladder. -IVF as above -Check FeNa -Follow up renal function by BMP  Generalized weakness: most likely multifactorial in etiology, including recurrent UTI, multiple comorbidities and deconditioning. -Treat UTI as above -PT/OT  DM-II: Last A1c 10. 1 on 04/06/13, not controled. Patient is taking glargine insulin and Humalog at home -will decrease glargine insulin from 14 units to 10 units daily -SSI  HLD: LDL was 59 on 01/05/15 -Continue home medications: Zocor  Essential hypertension: Not on medications. Blood pressure is normal on admission. -Observed blood pressure closely.  Possible bronchitis, Asthma and CODP: Patient does not have chest pain, shortness of breath. No acute exacerbation. Chest x-ray showed possible bronchitis. -Albuterol Nebs when necessary -continue home Tiotropium Bromide-Olodaterol inhaler -On Rocephin and azithromycin as above -f/u sputum culture  Neurogenic bladder:  -self cath prn -Rocephin IV for UTI  Combined systolic and diastolic congestive heart failure: 2-D echo on 09/08/11 showed EF of 45-2% with grade 1 diastolic dysfunction. Patient is clinically dry. He is not taking any diuretics at home. -watch volume status closely.  Protein-calorie malnutrition, severe (HCC): -Ensure -Counseled to nutrition team  DVT ppx: SQ Heparin  Code Status: Full code Family  Communication:  Yes, patient's wife at bed side Disposition Plan: Admit to inpatient   Date of Service 06/18/2015    Lorretta Harp [facility] Pager [phone]  If 7PM-7AM, please contact night-coverage [email or web address] Password [facility] 06/18/2015, 8:44 PM

## 2015-06-18 NOTE — ED Notes (Signed)
Per MD, Pt provided cheese, crackers, and Diet Ginger Ale.

## 2015-06-18 NOTE — ED Notes (Signed)
US at bedside

## 2015-06-18 NOTE — ED Notes (Signed)
Bed: [id number] Expected date:  Expected time:  Means of arrival:  Comments: EMS- elderly, septic?

## 2015-06-19 ENCOUNTER — Inpatient Hospital Stay (HOSPITAL_COMMUNITY): Payer: Medicare Other

## 2015-06-19 ENCOUNTER — Encounter (HOSPITAL_COMMUNITY): Payer: Self-pay | Admitting: Radiology

## 2015-06-19 DIAGNOSIS — R609 Edema, unspecified: Secondary | ICD-10-CM

## 2015-06-19 DIAGNOSIS — J069 Acute upper respiratory infection, unspecified: Secondary | ICD-10-CM

## 2015-06-19 DIAGNOSIS — N320 Bladder-neck obstruction: Secondary | ICD-10-CM

## 2015-06-19 DIAGNOSIS — J189 Pneumonia, unspecified organism: Secondary | ICD-10-CM

## 2015-06-19 DIAGNOSIS — N184 Chronic kidney disease, stage 4 (severe): Secondary | ICD-10-CM

## 2015-06-19 DIAGNOSIS — N390 Urinary tract infection, site not specified: Secondary | ICD-10-CM

## 2015-06-19 DIAGNOSIS — E43 Unspecified severe protein-calorie malnutrition: Secondary | ICD-10-CM

## 2015-06-19 DIAGNOSIS — N179 Acute kidney failure, unspecified: Secondary | ICD-10-CM

## 2015-06-19 LAB — GLUCOSE, CAPILLARY
GLUCOSE-CAPILLARY: 419 mg/dL — AB (ref 65–99)
GLUCOSE-CAPILLARY: 286 mg/dL — AB (ref 65–99)
GLUCOSE-CAPILLARY: 195 mg/dL — AB (ref 65–99)
Glucose-Capillary: 387 mg/dL — ABNORMAL HIGH (ref 65–99)
Glucose-Capillary: 411 mg/dL — ABNORMAL HIGH (ref 65–99)
Glucose-Capillary: 143 mg/dL — ABNORMAL HIGH (ref 65–99)

## 2015-06-19 LAB — STREP PNEUMONIAE URINARY ANTIGEN: STREP PNEUMO URINARY ANTIGEN: NEGATIVE

## 2015-06-19 MED ORDER — INSULIN ASPART 100 UNIT/ML ~~LOC~~ SOLN
0.0000 [IU] | Freq: Three times a day (TID) | SUBCUTANEOUS | Status: DC
  Administered 2015-06-19: 3 [IU] via SUBCUTANEOUS
  Administered 2015-06-19: 20 [IU] via SUBCUTANEOUS
  Administered 2015-06-20: 3 [IU] via SUBCUTANEOUS
  Administered 2015-06-20: 4 [IU] via SUBCUTANEOUS
  Administered 2015-06-20: 11 [IU] via SUBCUTANEOUS
  Administered 2015-06-21: 4 [IU] via SUBCUTANEOUS
  Administered 2015-06-21: 3 [IU] via SUBCUTANEOUS
  Administered 2015-06-22: 4 [IU] via SUBCUTANEOUS
  Administered 2015-06-22: 7 [IU] via SUBCUTANEOUS

## 2015-06-19 MED ORDER — IPRATROPIUM-ALBUTEROL 0.5-2.5 (3) MG/3ML IN SOLN
3.0000 mL | Freq: Two times a day (BID) | RESPIRATORY_TRACT | Status: DC
  Administered 2015-06-20 – 2015-06-22 (×2): 3 mL via RESPIRATORY_TRACT
  Filled 2015-06-19 (×4): qty 3

## 2015-06-19 MED ORDER — DEXTROSE 5 % IV SOLN: 500.0000 mg | INTRAVENOUS | Status: DC

## 2015-06-19 MED ORDER — PRO-STAT SUGAR FREE PO LIQD
30.0000 mL | Freq: Every morning | ORAL | Status: DC
  Administered 2015-06-19 – 2015-06-22 (×4): 30 mL via ORAL
  Filled 2015-06-19 (×4): qty 30

## 2015-06-19 MED ORDER — SENNOSIDES-DOCUSATE SODIUM 8.6-50 MG PO TABS
1.0000 | ORAL_TABLET | Freq: Two times a day (BID) | ORAL | Status: DC
  Administered 2015-06-19 – 2015-06-20 (×3): 1 via ORAL
  Filled 2015-06-19 (×3): qty 1

## 2015-06-19 MED ORDER — GLUCERNA SHAKE PO LIQD
237.0000 mL | Freq: Three times a day (TID) | ORAL | Status: DC
  Administered 2015-06-19 – 2015-06-22 (×7): 237 mL via ORAL
  Filled 2015-06-19 (×11): qty 237

## 2015-06-19 MED ORDER — GUAIFENESIN ER 600 MG PO TB12
600.0000 mg | ORAL_TABLET | Freq: Two times a day (BID) | ORAL | Status: DC
  Administered 2015-06-19 – 2015-06-22 (×7): 600 mg via ORAL
  Filled 2015-06-19 (×7): qty 1

## 2015-06-19 MED ORDER — INSULIN ASPART 100 UNIT/ML ~~LOC~~ SOLN
3.0000 [IU] | Freq: Three times a day (TID) | SUBCUTANEOUS | Status: DC
Start: 2015-06-19 — End: 2015-06-20
  Administered 2015-06-19 – 2015-06-20 (×3): 3 [IU] via SUBCUTANEOUS

## 2015-06-19 MED ORDER — IPRATROPIUM-ALBUTEROL 0.5-2.5 (3) MG/3ML IN SOLN
3.0000 mL | Freq: Three times a day (TID) | RESPIRATORY_TRACT | Status: DC
  Administered 2015-06-19: 3 mL via RESPIRATORY_TRACT

## 2015-06-19 NOTE — Care Management Note (Signed)
Case Management Note  Patient Details  Name: Benjamin Riddle MRN: [id number] Date of Birth: 01/17/1928  Subjective/Objective:  81 y/o m admitted w/UTI. From home. PT/OT-HH.Will offer HHC agency list.Await choice.                  Action/Plan:d/c plan home.   Expected Discharge Date:   (unknown)               Expected Discharge Plan:  Home w Home Health Services  In-House Referral:     Discharge planning Services  CM Consult  Post Acute Care Choice:    Choice offered to:     DME Arranged:    DME Agency:     HH Arranged:    HH Agency:     Status of Service:  In process, will continue to follow  Medicare Important Message Given:    Date Medicare IM Given:    Medicare IM give by:    Date Additional Medicare IM Given:    Additional Medicare Important Message give by:     If discussed at Long Length of Stay Meetings, dates discussed:    Additional Comments:  Lanier ClamMahabir, Olander Friedl, RN 06/19/2015, 2:09 PM

## 2015-06-19 NOTE — Progress Notes (Signed)
Initial Nutrition Assessment  DOCUMENTATION CODES:   Severe malnutrition in context of chronic illness  INTERVENTION:  - Continue Glucerna Shake TID, each supplement provides 220 kcal and 10 grams of protein - Will order 30 mL Prostat once/day, each supplement provides 100 kcal and 15 grams of protein. - Encourage PO intakes of meals and supplements - D/c Heart Healthy diet restriction to promote increased intakes - RD will continue to monitor for needs  NUTRITION DIAGNOSIS:   Inadequate oral intake related to poor appetite as evidenced by per patient/family report.  GOAL:   Patient will meet greater than or equal to 90% of their needs  MONITOR:   PO intake, Supplement acceptance, Weight trends, Labs, Skin, I & O's  REASON FOR ASSESSMENT:   Malnutrition Screening Tool  ASSESSMENT:   81 y.o. male with PMH of I's and but had often late obstruction, neurogenic bladder with self cath, hypertension, hyperlipidemia, diabetes mellitus, COPD, asthma, osteoporosis, hypogonadism, HCV, combined systolic and diastolic congestive heart failure, CKD-IV, who presents with generalized weakness, fever and fall.  Pt seen for MST. BMI indicates normal weight status. Pt states he ate 50% of egg white omelet and hashbrowns for breakfast this AM. He states that he does not like the taste of the food and he and wife, who is at bedside, are requesting d/c of Heart Healthy restriction; will page MD concerning this.   Pt's wife presents most of the information. She states that pt controls DM well at home and that recent spike in CBGs is related to inadequate insulin administration as well as infection. She states that due to elevated CBGs pt has had some confusion. Pt states that PTA he had a good appetite. Wife states that pt was still eating but that his portions have become smaller and smaller and that over the past 1.5 years he has lost ~20 lbs.   Per chart review, pt has lost 21 lbs (14% body weight)  in the past 6.5 months which is significant for time frame. Moderate and severe muscle and severe fat wasting noted during physical assessment.  Pt's wife states that she was buying a nutrition supplement for home use but does not recall the name of this supplement. She states that she reads labels and this particular supplement had 350 kcal and only 4 grams of carbohydrate. MD has already [facility] pt [facility] Ensure Enlive [facility]; will also add Prostat once/day.   Likely not meeting needs. Medications reviewed. Labs reviewed; CBGs: 387 and 419 mg/dL, BUN/creatinine elevated, GFR: 21.   Diet Order:  Diet heart healthy/carb modified Room service appropriate?: Yes; Fluid consistency:: Thin  Skin:  Reviewed, no issues  Last BM:  11/27  Height:   Ht Readings from Last 1 Encounters:  06/18/15 5\' 8"  (1.727 m)    Weight:   Wt Readings from Last 1 Encounters:  06/18/15 125 lb 10.6 oz (57 kg)    Ideal Body Weight:  70 kg (kg)  BMI:  Body mass index is 19.11 kg/(m^2).  Estimated Nutritional Needs:   Kcal:  1610-96041425-1625  Protein:  60-70 grams  Fluid:  2 L/day  EDUCATION NEEDS:   No education needs identified at this time     Trenton GammonJessica Elease Swarm, RD, LDN Inpatient Clinical Dietitian Pager # [phone] After hours/weekend pager # [phone]

## 2015-06-19 NOTE — Progress Notes (Signed)
Not[facility]  Gram neg rods

## 2015-06-19 NOTE — Evaluation (Signed)
Occupational Therapy Evaluation Patient Details Name: Benjamin Riddle MRN: [id number] DOB: 02-19-1928 Today's Date: 06/19/2015    History of Present Illness 81 y.o. male with PMH of IDDM, asthma, COPD, CKD-III, DM, GERD, HTN, neurogenic bladder (doing self cath), osteoporosis, recurrent UTI, Hep C, combined systolic and diastolic congestive heart failure, who was admitted with UTI, sepsis, and a fall just PTA.    Clinical Impression   Pt admitted with the above diagnosis and has the deficits listed below. Pt would benefit from cont OT to increase independence and efficiency with all adls so he can be more self sufficient at home and take the burden of care off of his wife.      Follow Up Recommendations  Home health OT;Supervision/Assistance - 24 hour    Equipment Recommendations  None recommended by OT    Recommendations for Other Services       Precautions / Restrictions Precautions Precautions: Fall Precaution Comments: fell PTA Restrictions Weight Bearing Restrictions: No      Mobility Bed Mobility Overal bed mobility: Modified Independent             General bed mobility comments: HOB up 20*, increased time  Transfers Overall transfer level: Needs assistance Equipment used: Rolling walker (2 wheeled) Transfers: Sit to/from Stand Sit to Stand: Min assist;From elevated surface         General transfer comment: min A to rise and steady    Balance Overall balance assessment: Needs assistance Sitting-balance support: Feet supported Sitting balance-Leahy Scale: Good     Standing balance support: During functional activity;Bilateral upper extremity supported Standing balance-Leahy Scale: Poor Standing balance comment: Pt requires outside assist to stand from person or from walker.                            ADL Overall ADL's : Needs assistance/impaired Eating/Feeding: Set up;Sitting   Grooming: Wash/dry face;Wash/dry hands;Min  guard;Standing   Upper Body Bathing: Set up;Sitting   Lower Body Bathing: Minimal assistance;Sit to/from stand Lower Body Bathing Details (indicate cue type and reason): min assist to maintain balance in standing while bathing. Upper Body Dressing : Set up;Sitting   Lower Body Dressing: Minimal assistance;Sit to/from stand Lower Body Dressing Details (indicate cue type and reason): min assist for shoes.  Has shoe horn he uses at home.  Donned and doffed socks Ily.   Toilet Transfer: Minimal assistance;Ambulation;RW;Comfort height toilet Toilet Transfer Details (indicate cue type and reason): min guard for balance. Toileting- ArchitectClothing Manipulation and Hygiene: Min guard;Sit to/from stand Toileting - Clothing Manipulation Details (indicate cue type and reason): min guard for balance.     Functional mobility during ADLs: Minimal assistance;Rolling walker General ADL Comments: Pt very weak and malnourished looking. Pt fatigues quickly which affects participation in therapy.  Pt with very flat affect during therapy session.  Wonder if there is a depression component as pt really not really interested in activites presented.      Vision Vision Assessment?: No apparent visual deficits   Perception     Praxis      Pertinent Vitals/Pain Pain Assessment: No/denies pain     Hand Dominance Right   Extremity/Trunk Assessment Upper Extremity Assessment Upper Extremity Assessment: Generalized weakness   Lower Extremity Assessment Lower Extremity Assessment: Defer to PT evaluation   Cervical / Trunk Assessment Cervical / Trunk Assessment: Kyphotic   Communication Communication Communication: No difficulties   Cognition Arousal/Alertness: Awake/alert Behavior During Therapy: Flat affect Overall Cognitive  Status: Within Functional Limits for tasks assessed                     General Comments       Exercises       Shoulder Instructions      Home Living Family/patient  expects to be discharged to:: Private residence Living Arrangements: Spouse/significant other Available Help at Discharge: Available 24 hours/day Type of Home: House Home Access: Stairs to enter [employer] of Steps: 1   Home Layout: One level     Bathroom Shower/Tub: Walk-in shower;Door   Bathroom Toilet: Handicapped height     Home Equipment: Environmental consultant - 2 wheels;Cane - single point;Walker - 4 wheels;Shower seat          Prior Functioning/Environment Level of Independence: Needs assistance    ADL's / Homemaking Assistance Needed: has needed more assist recently with home making and donning and doffiing socks.   Comments: uses RW or cane    OT Diagnosis: Generalized weakness   OT Problem List: Decreased strength;Decreased activity tolerance;Impaired balance (sitting and/or standing);Decreased knowledge of use of DME or AE   OT Treatment/Interventions: Self-care/ADL training;Therapeutic activities;DME and/or AE instruction    OT Goals(Current goals can be found in the care plan section) Acute Rehab OT Goals Patient Stated Goal: to build up more strength OT Goal Formulation: With patient Time For Goal Achievement: 07/03/15 Potential to Achieve Goals: Good ADL Goals Pt Will Perform Lower Body Bathing: with supervision;sit to/from stand Pt Will Perform Lower Body Dressing: with supervision;sit to/from stand Pt Will Perform Tub/Shower Transfer: Shower transfer;with supervision;ambulating;shower seat;rolling walker Additional ADL Goal #1: Pt will walk to comfort commode and toilet with s.  OT Frequency: Min 2X/week   Barriers to D/C:            Co-evaluation              End of Session Equipment Utilized During Treatment: Rolling walker Nurse Communication: Mobility status  Activity Tolerance: Patient limited by fatigue Patient left: in chair;with call bell/phone within reach;with chair alarm set   Time: 1206-1223 OT Time Calculation (min): 17  min Charges:  OT General Charges $OT Visit: 1 Procedure OT Evaluation $Initial OT Evaluation Tier I: 1 Procedure G-Codes:    Hope Budds 07-12-15, 12:34 PM  [phone]

## 2015-06-19 NOTE — Evaluation (Signed)
Physical Therapy Evaluation Patient Details Name: Benjamin Riddle MRN: [id number] DOB: 07-May-1928 Today's Date: 06/19/2015   History of Present Illness  81 y.o. male with PMH of IDDM, asthma, COPD, CKD-III, DM, GERD, HTN, neurogenic bladder (doing self cath), osteoporosis, recurrent UTI, Hep C, combined systolic and diastolic congestive heart failure, who was admitted with UTI, sepsis, and a fall just PTA.   Clinical Impression  Pt admitted with above diagnosis. Pt currently with functional limitations due to the deficits listed below (see PT Problem List). Pt ambulated 130' with RW and Min A for balance. Pt's wife notes pt has had a decline in strength recently, they agree to HHPT upon acute DC.  Pt will benefit from skilled PT to increase their independence and safety with mobility to allow discharge to the venue listed below.       Follow Up Recommendations Supervision for mobility/OOB;Home health PT (family not satisfied with previous [facility] agency, wants to try a different one)    Equipment Recommendations  None recommended by PT    Recommendations for Other Services OT consult     Precautions / Restrictions Precautions Precautions: Fall Restrictions Weight Bearing Restrictions: No      Mobility  Bed Mobility Overal bed mobility: Modified Independent             General bed mobility comments: HOB up 20*, increased time  Transfers Overall transfer level: Needs assistance Equipment used: Rolling walker (2 wheeled) Transfers: Sit to/from Stand Sit to Stand: Min assist;From elevated surface         General transfer comment: min A to rise and steady  Ambulation/Gait Ambulation/Gait assistance: Min assist Ambulation Distance (Feet): 130 Feet Assistive device: Rolling walker (2 wheeled) Gait Pattern/deviations: Step-to pattern;Decreased step length - left;Wide base of support   Gait velocity interpretation: Below normal speed for age/gender General Gait Details: pt  tends to drag LLE outside frame of RW (stated this is baseline), verbal cues to increase step length and to keep RW closer, min A to manage RW and negotiate turns  Careers information officer    Modified Rankin (Stroke Patients Only)       Balance Overall balance assessment: Needs assistance   Sitting balance-Leahy Scale: Good       Standing balance-Leahy Scale: Poor                               Pertinent Vitals/Pain Pain Assessment: No/denies pain    Home Living Family/patient expects to be discharged to:: Private residence Living Arrangements: Spouse/significant other Available Help at Discharge: Available 24 hours/day   Home Access: Stairs to enter   [employer] of Steps: 1 Home Layout: One level Home Equipment: Cane - single point;Walker - 2 wheels;Walker - 4 wheels      Prior Function           Comments: uses RW or cane     Hand Dominance        Extremity/Trunk Assessment   Upper Extremity Assessment: Defer to OT evaluation           Lower Extremity Assessment: Generalized weakness (B knee extension 4/5, sensation intact to light touch BLEs)      Cervical / Trunk Assessment: Kyphotic  Communication      Cognition Arousal/Alertness: Awake/alert Behavior During Therapy: Flat affect Overall Cognitive Status: Within Functional Limits for tasks assessed  General Comments      Exercises        Assessment/Plan    PT Assessment Patient needs continued PT services  PT Diagnosis Difficulty walking;Abnormality of gait;Generalized weakness   PT Problem List Decreased strength;Decreased activity tolerance;Decreased balance;Decreased safety awareness;Decreased mobility  PT Treatment Interventions DME instruction;Gait training;Functional mobility training;Stair training;Therapeutic activities;Patient/family education;Therapeutic exercise;Balance training   PT Goals (Current  goals can be found in the Care Plan section) Acute Rehab PT Goals Patient Stated Goal: to build up more strength PT Goal Formulation: With patient/family Time For Goal Achievement: 07/03/15 Potential to Achieve Goals: Good    Frequency Min 3X/week   Barriers to discharge        Co-evaluation               End of Session Equipment Utilized During Treatment: Gait belt Activity Tolerance: Patient limited by fatigue Patient left: in chair;with call bell/phone within reach;with family/visitor present;with chair alarm set Nurse Communication: Mobility status         Time: [id number] PT Time Calculation (min) (ACUTE ONLY): 18 min   Charges:   PT Evaluation $Initial PT Evaluation Tier I: 1 Procedure     PT G CodesTamala Riddle:        Benjamin Riddle 06/19/2015, 10:51 AM [phone]

## 2015-06-19 NOTE — Progress Notes (Signed)
PROGRESS NOTE  Benjamin Riddle YSA:[id number] DOB: April 06, 1928 DOA: 06/18/2015 PCP: Ezequiel KayserPERINI,MARK A, MD  HPI/Recap of past 24 hours:  Elevated blood sugar, drowsy, denies pain or sob, reported no bm for the last three days, wife in room  Assessment/Plan: Principal Problem:   UTI (lower urinary tract infection) Active Problems:   HLD (hyperlipidemia)   Asthmatic bronchitis , chronic (HCC)   COPD GOLD III    Combined systolic and diastolic congestive heart failure (HCC)   Acute renal failure superimposed on stage 4 chronic kidney disease (HCC)   Protein-calorie malnutrition, severe (HCC)   Bladder outlet obstruction   URI (upper respiratory infection)   Bronchitis   Sepsis (HCC)   CAP (community acquired pneumonia)  Drowsy: likely metabolic encephalopathy, will check ct head  UTI/g-rod bacteremia:  Continue current abx since  Fever subsided, f/u on final culture result  H/o copd, cxr with bronchitis/pna changes, continue rocephin/zithro, add duoneb, mild wheezing on exam, not in respiratory distress  Insulin dependent diabetes: adjust insulin dose  ARf on CKDIII, likely from uti, continue treat uti, renal dosing meds  Bilateral pedal edema: from malnutrition? Will check venous us to r/o DVT, will  Check echocardiogram  Constipation: stool softener  FTT/progressive weakness/weight loss: PT/OT, home health vs SNF  Code Status: full  Family Communication: patient and wife  Disposition Plan: home vs snf   Consultants:  none  Procedures:  none  Antibiotics:  Rocephin/zithro   Objective: BP 115/56 mmHg  Pulse 87  Temp(Src) 98.3 F (36.8 C) (Oral)  Resp 20  Ht 5\' 8"  (1.727 m)  Wt 125 lb 10.6 oz (57 kg)  BMI 19.11 kg/m2  SpO2 96%  Intake/Output Summary (Last 24 hours) at 06/19/15 1149 Last data filed at 06/19/15 0700  Gross per 24 hour  Intake 1708.33 ml  Output   1925 ml  Net -216.67 ml   Filed Weights   06/18/15 1721 06/18/15 2138  Weight: 128 lb  (58.06 kg) 125 lb 10.6 oz (57 kg)    Exam:   General:  drowsy  Cardiovascular: RRR  Respiratory: CTABL  Abdomen: Soft/ND/NT, positive BS  Musculoskeletal: bilateral pedal edema  Neuro: no focal definite, drowsy, but oriented x3  Data Reviewed: Basic Metabolic Panel:  Recent Labs Lab 06/18/15 1635  NA 138  K 4.6  CL 105  CO2 20*  GLUCOSE 71  BUN 59*  CREATININE 2.59*  CALCIUM 8.9   Liver Function Tests:  Recent Labs Lab 06/18/15 1635  AST 50*  ALT 18  ALKPHOS 51  BILITOT 0.6  PROT 7.6  ALBUMIN 3.9   No results for input(s): LIPASE, AMYLASE in the last 168 hours. No results for input(s): AMMONIA in the last 168 hours. CBC:  Recent Labs Lab 06/18/15 1635  WBC 4.7  NEUTROABS 2.3  HGB 13.1  HCT 39.5  MCV 94.5  PLT 357   Cardiac Enzymes:   No results for input(s): CKTOTAL, CKMB, CKMBINDEX, TROPONINI in the last 168 hours. BNP (last 3 results) No results for input(s): BNP in the last 8760 hours.  ProBNP (last 3 results) No results for input(s): PROBNP in the last 8760 hours.  CBG:  Recent Labs Lab 06/18/15 2328 06/19/15 0730  GLUCAP 387* 419*    Recent Results (from the past 240 hour(s))  Culture, blood (routine x 2)     Status: None (Preliminary result)   Collection Time: 06/18/15  4:38 PM  Result Value Ref Range Status   Specimen Description BLOOD RIGHT ANTECUBITAL  Final  Special Requests BOTTLES DRAWN AEROBIC AND ANAEROBIC 5CC  Final   Culture  Setup Time   Final    GRAM NEGATIVE RODS IN BOTH AEROBIC AND ANAEROBIC BOTTLES CRITICAL RESULT CALLED TO, READ BACK BY AND VERIFIED WITH: AMilana Obey AT 0903 ON [id number] BY Lucienne Capers Performed at [facility]    Culture PENDING  Incomplete   Report Status PENDING  Incomplete  Urine culture     Status: None (Preliminary result)   Collection Time: 06/18/15  6:22 PM  Result Value Ref Range Status   Specimen Description URINE, CATHETERIZED  Final   Special Requests NONE  Final    Culture   Final    >=100,000 COLONIES/mL GRAM NEGATIVE RODS CULTURE REINCUBATED FOR BETTER GROWTH Performed at [facility]    Report Status PENDING  Incomplete     Studies: US Renal  06/18/2015  CLINICAL DATA:  Acute kidney injury. EXAM: RENAL / URINARY TRACT ULTRASOUND COMPLETE COMPARISON:  CT 01/04/2015 FINDINGS: Right Kidney: Length: 12.2 cm. Generalized parenchymal echogenicity consistent with chronic medical renal disease. Cortical thinning. Moderate pelvic caliectasis and large extrarenal pelvis, unchanged from the 01/04/2015 CT. Left Kidney: Length: 10.1 cm. Generalized parenchymal echogenicity consistent with chronic medical renal disease. No hydronephrosis. Cortical thinning and irregularity, unchanged from the CT of 01/04/2015. Bladder: Collapsed around a Foley catheter. IMPRESSION: Both kidneys are atrophic and irregular with increased echogenicity consistent with chronic medical renal disease. No convincing interval change from 01/04/2015 CT. Electronically Signed   By: Ellery Plunk M.D.   On: 06/18/2015 21:50   Dg Pelvis Portable  06/18/2015  CLINICAL DATA:  Larey Seat today, no pelvic pain EXAM: PORTABLE PELVIS 1-2 VIEWS COMPARISON:  None. FINDINGS: There is no evidence of pelvic fracture or diastasis. No pelvic bone lesions are seen. IMPRESSION: Negative. Electronically Signed   By: Esperanza Heir M.D.   On: 06/18/2015 17:41   Dg Chest Port 1 View  06/18/2015  CLINICAL DATA:  81 year old male who presenting with sepsis. History of asthma. EXAM: PORTABLE CHEST 1 VIEW COMPARISON:  Chest x-ray 04/23/2015. FINDINGS: Diffuse peribronchial cuffing and hazy opacities most evident in the mid to upper lungs bilaterally. No pleural effusions. No evidence of pulmonary edema. Heart size is normal. Upper mediastinal contours are within normal limits. Atherosclerotic calcifications in the thoracic aorta. IMPRESSION: 1. Appearance of the chest suggests bronchitis, potentially with  developing multilobar bronchopneumonia most evident the mid to upper lungs, as above. 2. Atherosclerosis. Electronically Signed   By: Trudie Reed M.D.   On: 06/18/2015 17:48    Scheduled Meds: . acetaminophen  1,000 mg Oral Once  . azithromycin  500 mg Intravenous Q24H  . cefTRIAXone (ROCEPHIN)  IV  1 g Intravenous Q24H  . cholecalciferol  1,000 Units Oral Daily  . feeding supplement (GLUCERNA SHAKE)  237 mL Oral TID BM  . heparin  5,000 Units Subcutaneous 3 times per day  . insulin aspart  0-20 Units Subcutaneous TID WC  . insulin aspart  3 Units Subcutaneous TID WC  . insulin glargine  10 Units Subcutaneous QHS  . simvastatin  40 mg Oral q1800  . URELLE  1 tablet Oral BID    Continuous Infusions:    Time spent: 35ns  Wandalene Abrams MD, PhD  [facility] Pager [phone]. If 7PM-7AM, please contact night-coverage at [email or web address], password [facility] 06/19/2015, 11:49 AM  LOS: 1 day

## 2015-06-19 NOTE — Progress Notes (Signed)
Dr Roda ShuttersXu made aware of BS=[id number] no new orders

## 2015-06-19 NOTE — Progress Notes (Signed)
VASCULAR LAB PRELIMINARY  PRELIMINARY  PRELIMINARY  PRELIMINARY  Bilateral lower extremity venous duplex  completed.    Preliminary report:  Bilateral:  No evidence of DVT, superficial thrombosis, or Baker's Cyst.    Afnan Cadiente, RVT 06/19/2015, 2:36 PM

## 2015-06-20 ENCOUNTER — Inpatient Hospital Stay (HOSPITAL_COMMUNITY): Payer: Medicare Other

## 2015-06-20 DIAGNOSIS — A419 Sepsis, unspecified organism: Principal | ICD-10-CM

## 2015-06-20 LAB — COMPREHENSIVE METABOLIC PANEL
ALT: 14 U/L — AB (ref 17–63)
AST: 22 U/L (ref 15–41)
Albumin: 2.5 g/dL — ABNORMAL LOW (ref 3.5–5.0)
Alkaline Phosphatase: 39 U/L (ref 38–126)
Anion gap: 7 (ref 5–15)
BILIRUBIN TOTAL: 0.1 mg/dL — AB (ref 0.3–1.2)
BUN: 50 mg/dL — ABNORMAL HIGH (ref 6–20)
CHLORIDE: 111 mmol/L (ref 101–111)
CO2: 19 mmol/L — ABNORMAL LOW (ref 22–32)
CREATININE: 2.04 mg/dL — AB (ref 0.61–1.24)
Calcium: 7.8 mg/dL — ABNORMAL LOW (ref 8.9–10.3)
GFR, EST AFRICAN AMERICAN: 33 mL/min — AB (ref >60)
GFR, EST NON AFRICAN AMERICAN: 28 mL/min — AB (ref >60)
Glucose, Bld: 318 mg/dL — ABNORMAL HIGH (ref 65–99)
POTASSIUM: 4.4 mmol/L (ref 3.5–5.1)
Sodium: 137 mmol/L (ref 135–145)
TOTAL PROTEIN: 5.4 g/dL — AB (ref 6.5–8.1)

## 2015-06-20 LAB — GLUCOSE, CAPILLARY
GLUCOSE-CAPILLARY: 148 mg/dL — AB (ref 65–99)
Glucose-Capillary: 267 mg/dL — ABNORMAL HIGH (ref 65–99)
Glucose-Capillary: 180 mg/dL — ABNORMAL HIGH (ref 65–99)
Glucose-Capillary: 261 mg/dL — ABNORMAL HIGH (ref 65–99)

## 2015-06-20 LAB — PSA: PSA: 1.35 ng/mL (ref 0.00–4.00)

## 2015-06-20 LAB — LACTIC ACID, PLASMA: LACTIC ACID, VENOUS: 0.9 mmol/L (ref 0.5–2.0)

## 2015-06-20 MED ORDER — INSULIN GLARGINE 100 UNIT/ML ~~LOC~~ SOLN
20.0000 [IU] | Freq: Every day | SUBCUTANEOUS | Status: DC
  Administered 2015-06-20 – 2015-06-21 (×2): 20 [IU] via SUBCUTANEOUS
  Filled 2015-06-20 (×3): qty 0.2

## 2015-06-20 MED ORDER — SENNOSIDES-DOCUSATE SODIUM 8.6-50 MG PO TABS
2.0000 | ORAL_TABLET | Freq: Two times a day (BID) | ORAL | Status: DC
  Administered 2015-06-21 – 2015-06-22 (×3): 2 via ORAL
  Filled 2015-06-20 (×4): qty 2

## 2015-06-20 MED ORDER — POLYETHYLENE GLYCOL 3350 17 G PO PACK
17.0000 g | PACK | Freq: Every day | ORAL | Status: DC
  Administered 2015-06-20 – 2015-06-22 (×2): 17 g via ORAL
  Filled 2015-06-20 (×2): qty 1

## 2015-06-20 MED ORDER — INSULIN ASPART 100 UNIT/ML ~~LOC~~ SOLN
5.0000 [IU] | Freq: Three times a day (TID) | SUBCUTANEOUS | Status: DC
  Administered 2015-06-20 – 2015-06-22 (×7): 5 [IU] via SUBCUTANEOUS

## 2015-06-20 NOTE — Progress Notes (Signed)
  Echocardiogram 2D Echocardiogram has been performed.  Janalyn HarderWest, Tamanika Heiney R 06/20/2015, 1:52 PM

## 2015-06-20 NOTE — Progress Notes (Signed)
PROGRESS NOTE  Benjamin Riddle WJX:[id number] DOB: [birth year] DOA: 06/18/2015 PCP: Ezequiel Kayser, MD  HPI/Recap of past 24 hours:  Much more alert, reports feeling better, denies pain, does not want heart health diet, reported still no bm, wife in room  Assessment/Plan: Principal Problem:   UTI (lower urinary tract infection) Active Problems:   HLD (hyperlipidemia)   Asthmatic bronchitis , chronic (HCC)   COPD GOLD III    Combined systolic and diastolic congestive heart failure (HCC)   Acute renal failure superimposed on stage 4 chronic kidney disease (HCC)   Protein-calorie malnutrition, severe (HCC)   Bladder outlet obstruction   URI (upper respiratory infection)   Bronchitis   Sepsis (HCC)   CAP (community acquired pneumonia)  Drowsy: likely metabolic encephalopathy,  ct head no acute findings, better.  UTI/g-rod bacteremia:  Continue current abx since  Fever subsided, f/u on final culture result  H/o copd, cxr with bronchitis/pna changes, continue rocephin/zithro, add duoneb, mild wheezing on exam, not in respiratory distress  Insulin dependent diabetes: adjust insulin dose  ARf on CKDIII, likely from uti, continue treat uti, renal dosing meds  Bilateral pedal edema: from malnutrition?  venous US no DVT, echocardiogram pending  Constipation: stool softener  FTT/progressive weakness/weight loss: PT/OT, home health vs SNF  Code Status: full  Family Communication: patient and wife  Disposition Plan: home vs snf   Consultants:  none  Procedures:  none  Antibiotics:  Rocephin/zithro   Objective: BP 134/72 mmHg  Pulse 72  Temp(Src) 98.9 F (37.2 C) (Oral)  Resp 16  Ht  (1.727 m)  Wt 128 lb 4.9 oz (58.2 kg)  BMI 19.51 kg/m2  SpO2 97%  Intake/Output Summary (Last 24 hours) at 06/20/15 1051 Last data filed at 06/20/15 0501  Gross per 24 hour  Intake   1020 ml  Output   2950 ml  Net  -1930 ml   Filed Weights   06/18/15 1721 06/18/15 2138  06/20/15 0457  Weight: 128 lb (58.06 kg) 125 lb 10.6 oz (57 kg) 128 lb 4.9 oz (58.2 kg)    Exam:   General:  More alert, aaox3  Cardiovascular: RRR  Respiratory: CTABL  Abdomen: Soft/ND/NT, positive BS  Musculoskeletal: bilateral pedal edema  Neuro: no focal definite, fully alert, oriented x3  Data Reviewed: Basic Metabolic Panel:  Recent Labs Lab 06/18/15 1635 06/20/15 0446  NA 138 137  K 4.6 4.4  CL 105 111  CO2 20* 19*  GLUCOSE 71 318*  BUN 59* 50*  CREATININE 2.59* 2.04*  CALCIUM 8.9 7.8*   Liver Function Tests:  Recent Labs Lab 06/18/15 1635 06/20/15 0446  AST 50* 22  ALT 18 14*  ALKPHOS 51 39  BILITOT 0.6 0.1*  PROT 7.6 5.4*  ALBUMIN 3.9 2.5*   No results for input(s): LIPASE, AMYLASE in the last 168 hours. No results for input(s): AMMONIA in the last 168 hours. CBC:  Recent Labs Lab 06/18/15 1635  WBC 4.7  NEUTROABS 2.3  HGB 13.1  HCT 39.5  MCV 94.5  PLT 357   Cardiac Enzymes:   No results for input(s): CKTOTAL, CKMB, CKMBINDEX, TROPONINI in the last 168 hours. BNP (last 3 results) No results for input(s): BNP in the last 8760 hours.  ProBNP (last 3 results) No results for input(s): PROBNP in the last 8760 hours.  CBG:  Recent Labs Lab 06/19/15 1131 06/19/15 1343 06/19/15 1625 06/19/15 2101 06/20/15 0716  GLUCAP 411* 286* 143* 195* 267*    Recent Results (from  the past 240 hour(s))  Culture, blood (routine x 2)     Status: None (Preliminary result)   Collection Time: 06/18/15  4:38 PM  Result Value Ref Range Status   Specimen Description BLOOD RIGHT ANTECUBITAL  Final   Special Requests BOTTLES DRAWN AEROBIC AND ANAEROBIC 5CC  Final   Culture  Setup Time   Final    GRAM NEGATIVE RODS IN BOTH AEROBIC AND ANAEROBIC BOTTLES CRITICAL RESULT CALLED TO, READ BACK BY AND VERIFIED WITH: A. JOHNSON,RN AT 1610 ON 113016 BY Lucienne Capers    Culture   Final    NO GROWTH < 24 HOURS Performed at [facility]    Report  Status PENDING  Incomplete  Urine culture     Status: None (Preliminary result)   Collection Time: 06/18/15  6:22 PM  Result Value Ref Range Status   Specimen Description URINE, CATHETERIZED  Final   Special Requests NONE  Final   Culture   Final    >=100,000 COLONIES/mL GRAM NEGATIVE RODS CULTURE REINCUBATED FOR BETTER GROWTH Performed at [facility]    Report Status PENDING  Incomplete     Studies: Ct Head Wo Contrast  06/19/2015  CLINICAL DATA:  Confusion EXAM: CT HEAD WITHOUT CONTRAST TECHNIQUE: Contiguous axial images were obtained from the base of the skull through the vertex without intravenous contrast. COMPARISON:  Head CT June 14, 2005 and brain MRI November 04 2013 FINDINGS: There is generalized ventricular enlargement, stable compared to prior studies. The sulci are mildly prominent, stable. There is no intracranial mass, hemorrhage, or extra-axial fluid collection. No midline shift. There is a prior small lacunar infarct in the head of the caudate nucleus on the right. Elsewhere gray-white compartments appear normal. No acute infarct is evident. The bony calvarium shows evidence of an old right frontal bone fracture, healed. There is evidence of prior anterior facial trauma with remodeling and packing with fat. No intraorbital lesions are appreciated. Mastoid air cells are clear. IMPRESSION: Diffuse ventricular enlargement is a stable finding. This degree of ventricular enlargement, disproportionate to the sulci, raises question of normal pressure hydrocephalus. The chronicity of this finding is uncertain with respect to potential normal pressure hydrocephalus. Prior small lacunar infarct in the head of the caudate nucleus on the right. No hemorrhage, intra-axial mass, or acute appearing infarct. Extensive postoperative change in the upper facial region. Old healed fracture right frontal bone. No acute fracture evident. Mastoid air cells are clear. Electronically Signed   By:  Bretta Bang III M.D.   On: 06/19/2015 15:01    Scheduled Meds: . acetaminophen  1,000 mg Oral Once  . azithromycin  500 mg Intravenous Q24H  . cefTRIAXone (ROCEPHIN)  IV  1 g Intravenous Q24H  . cholecalciferol  1,000 Units Oral Daily  . feeding supplement (GLUCERNA SHAKE)  237 mL Oral TID BM  . feeding supplement (PRO-STAT SUGAR FREE 64)  30 mL Oral q morning - 10a  . guaiFENesin  600 mg Oral BID  . heparin  5,000 Units Subcutaneous 3 times per day  . insulin aspart  0-20 Units Subcutaneous TID WC  . insulin aspart  3 Units Subcutaneous TID WC  . insulin glargine  10 Units Subcutaneous QHS  . ipratropium-albuterol  3 mL Nebulization BID  . polyethylene glycol  17 g Oral Daily  . senna-docusate  2 tablet Oral BID  . simvastatin  40 mg Oral q1800  . URELLE  1 tablet Oral BID    Continuous Infusions:  Time spent: 25mins  Bronda Alfred MD, PhD  [facility] Pager [phone]. If 7PM-7AM, please contact night-coverage at [email or web address], password [email or web address] 06/20/2015, 10:51 AM  LOS: 2 days

## 2015-06-21 LAB — CULTURE, BLOOD (ROUTINE X 2)

## 2015-06-21 LAB — CBC
HCT: 32.2 % — ABNORMAL LOW (ref 39.0–52.0)
Hemoglobin: 10.5 g/dL — ABNORMAL LOW (ref 13.0–17.0)
MCH: 31.2 pg (ref 26.0–34.0)
MCHC: 32.6 g/dL (ref 30.0–36.0)
MCV: 95.5 fL (ref 78.0–100.0)
PLATELETS: 333 10*3/uL (ref 150–400)
RBC: 3.37 MIL/uL — AB (ref 4.22–5.81)
RDW: 14.7 % (ref 11.5–15.5)
WBC: 8.2 10*3/uL (ref 4.0–10.5)

## 2015-06-21 LAB — BASIC METABOLIC PANEL
ANION GAP: 5 (ref 5–15)
BUN: 43 mg/dL — ABNORMAL HIGH (ref 6–20)
CO2: 24 mmol/L (ref 22–32)
Calcium: 8.5 mg/dL — ABNORMAL LOW (ref 8.9–10.3)
Chloride: 111 mmol/L (ref 101–111)
Creatinine, Ser: 1.76 mg/dL — ABNORMAL HIGH (ref 0.61–1.24)
GFR calc non Af Amer: 34 mL/min — ABNORMAL LOW (ref >60)
GFR, EST AFRICAN AMERICAN: 39 mL/min — AB (ref >60)
GLUCOSE: 240 mg/dL — AB (ref 65–99)
POTASSIUM: 4.8 mmol/L (ref 3.5–5.1)
Sodium: 140 mmol/L (ref 135–145)

## 2015-06-21 LAB — URINE CULTURE: Culture: >=100000

## 2015-06-21 LAB — GLUCOSE, CAPILLARY
GLUCOSE-CAPILLARY: 89 mg/dL (ref 65–99)
GLUCOSE-CAPILLARY: 162 mg/dL — AB (ref 65–99)
Glucose-Capillary: 131 mg/dL — ABNORMAL HIGH (ref 65–99)

## 2015-06-21 LAB — TSH: TSH: 1.241 u[IU]/mL (ref 0.350–4.500)

## 2015-06-21 MED ORDER — DEXTROSE 5 % IV SOLN
2.0000 g | INTRAVENOUS | Status: DC
  Filled 2015-06-21: qty 2

## 2015-06-21 MED ORDER — AZITHROMYCIN 500 MG PO TABS: 500.0000 mg | ORAL_TABLET | Freq: Every day | ORAL | Status: DC

## 2015-06-21 MED ORDER — CEPHALEXIN 500 MG PO CAPS
500.0000 mg | ORAL_CAPSULE | Freq: Two times a day (BID) | ORAL | Status: DC
  Administered 2015-06-21 – 2015-06-22 (×3): 500 mg via ORAL
  Filled 2015-06-21 (×3): qty 1

## 2015-06-21 MED ORDER — PROMETHAZINE HCL 25 MG/ML IJ SOLN
12.5000 mg | Freq: Four times a day (QID) | INTRAMUSCULAR | Status: DC | PRN
  Administered 2015-06-21: 12.5 mg via INTRAVENOUS
  Filled 2015-06-21: qty 1

## 2015-06-21 NOTE — Care Management Note (Signed)
Case Management Note  Patient Details  Name: Arrie AranJerry Majid MRN: [id number] Date of Birth: [birth year]  Subjective/Objective:faxed face sheet,h&p w/confirmation to [facility] rep LaShawnda fax#[phone]/tel#[phone].Await HHPT,[id number] order.                    Action/Plan:d/c plan home w/HHC.   Expected Discharge Date:   (unknown)               Expected Discharge Plan:  Home w Home Health Services  In-House Referral:     Discharge planning Services  CM Consult  Post Acute Care Choice:    Choice offered to:  Patient  DME Arranged:    DME Agency:     HH Arranged:    HH Agency:  [facility]  Status of Service:  In process, will continue to follow  Medicare Important Message Given:  Yes Date Medicare IM Given:    Medicare IM give by:    Date Additional Medicare IM Given:    Additional Medicare Important Message give by:     If discussed at Long Length of Stay Meetings, dates discussed:    Additional Comments:  Lanier ClamMahabir, Olamide Carattini, RN 06/21/2015, 3:55 PM

## 2015-06-21 NOTE — Progress Notes (Signed)
PHARMACIST - PHYSICIAN COMMUNICATION DR:   Roda ShuttersXu CONCERNING: Antibiotic IV to Oral Route Change Policy  RECOMMENDATION: This patient is receiving Zithromax by the intravenous route.  Based on criteria approved by the Pharmacy and Therapeutics Committee, the antibiotic(s) is/are being converted to the equivalent oral dose form(s).   DESCRIPTION: These criteria include:  Patient being treated for a respiratory tract infection, urinary tract infection, cellulitis or clostridium difficile associated diarrhea if on metronidazole  The patient is not neutropenic and does not exhibit a GI malabsorption state  The patient is eating (either orally or via tube) and/or has been taking other orally administered medications for a least 24 hours  The patient is improving clinically and has a Tmax < 100.5  If you have questions about this conversion, please contact the Pharmacy Department  []   [phone]( [phone] )  [facility] []   [phone]( [phone] )  [facility] []   [phone]( [phone] )  [facility] []   [phone]( [phone] )  [facility] [x]   [phone]( [phone] )  [facility]    Hessie KnowsJustin M Britney Newstrom, PharmD, [place] Pager [phone] 06/21/2015 12:09 PM

## 2015-06-21 NOTE — Progress Notes (Signed)
Physical Therapy Treatment Patient Details Name: Benjamin Riddle MRN: [id number] DOB: 05-22-1928 Today's Date: 06/21/2015    History of Present Illness 81 y.o. male with PMH of IDDM, asthma, COPD, CKD-III, DM, GERD, HTN, neurogenic bladder (doing self cath), osteoporosis, recurrent UTI, Hep C, combined systolic and diastolic congestive heart failure, who was admitted with UTI, sepsis, and a fall just PTA.     PT Comments    Pt. C/o nausea limiting participation/mobility today - RN notified; required min assist for supine to sit to bring trunk upright and BLE OOB; ambualted 25' stated c/o nausea - seated rest break - ambulated 25' back to room; pt. Presents with shuffle/festinating, narrow BOS, pt tends to drag LLE outside frame of RW with turns, 50% VC to increase step length and to keep RW closer, min A to manage RW and negotiate turns; for STS pt. Requires min assist to steady and 75% VC for proper hand placement on seated surface; requires min-mod assist for sit to supine for navigation of trunk and BLE as well as use of bed pad to scoot pt. Up in bed. HIGH FALL RISK.      Follow Up Recommendations  Supervision for mobility/OOB;Home health PT     Equipment Recommendations  None recommended by PT    Recommendations for Other Services OT consult     Precautions / Restrictions Precautions Precautions: Fall Precaution Comments: fell PTA Restrictions Weight Bearing Restrictions: No    Mobility  Bed Mobility Overal bed mobility: Modified Independent;Needs Assistance Bed Mobility: Supine to Sit;Sit to Supine     Supine to sit: Min guard;Min assist;HOB elevated Sit to supine: Min assist;Mod assist;HOB elevated   General bed mobility comments: supine to sit min assist to bring trunk upright and legs OOB; min-mod assist for sit to supine to bring BLE into bed and use of bed pad to scoot up in bed   Transfers Overall transfer level: Needs assistance Equipment used: Rolling walker  (2 wheeled) Transfers: Sit to/from Stand Sit to Stand: Min assist         General transfer comment: steadying assistance; 75% VC for correct hand placement on seated surface prior to ascend/descend   Ambulation/Gait Ambulation/Gait assistance: Min assist Ambulation Distance (Feet): 50 Feet (25 and 25) Assistive device: Rolling walker (2 wheeled) Gait Pattern/deviations: Step-to pattern;Shuffle;Festinating;Narrow base of support;Decreased dorsiflexion - right;Decreased dorsiflexion - left Gait velocity: decreased   General Gait Details: pt tends to drag LLE outside frame of RW with turns, 50% VC to increase step length and to keep RW closer, min A to manage RW and negotiate turns   Information systems manager [place] (Stroke Patients Only)       Balance                                    Cognition Arousal/Alertness: Awake/alert Behavior During Therapy: WFL for tasks assessed/performed Overall Cognitive Status: Within Functional Limits for tasks assessed                      Exercises      General Comments        Pertinent Vitals/Pain Pain Assessment: No/denies pain    Home Living                      Prior Function  PT Goals (current goals can now be found in the care plan section) Acute Rehab PT Goals Time For Goal Achievement: 07/03/15 Potential to Achieve Goals: Good Progress towards PT goals: Progressing toward goals    Frequency  Min 3X/week    PT Plan      Co-evaluation             End of Session Equipment Utilized During Treatment: Gait belt Activity Tolerance: Other (comment) (patient limited by nausea) Patient left: in bed;with call bell/phone within reach     Time: 1355-1415 PT Time Calculation (min) (ACUTE ONLY): 20 min  Charges:  $Gait Training: 8-22 mins                    G CodesMarin Comment:      Morgan Salzler-Albaugh, SPTA   06/21/2015 2:31 PM   Pager:  [phone]   Reviewed and agree with above Felecia ShellingLori Bhavin Monjaraz  PTA [facility]  Acute  Rehab Pager      [phone]

## 2015-06-21 NOTE — Consult Note (Signed)
   [facility]   06/21/2015  Arrie AranJerry Khader 1927/12/08 [id number]     [facility] based on number of admissions. Went to speak with patient at bedside to discuss and explain [facility]. He pleasantly declines and his wife states "he (patient) is a retired Development worker, communityphysician" and is able to manage at home regarding medical conditions. They do not feel that patient needs any additional support. He will have home health arranged post discharge. Left [facility] with contact information at bedside if needed in the future. Spoke with inpatient RNCM about the above.   Raiford NobleAtika Arneda Sappington, MSN-Ed, RN,BSN [facility] Care Management Hospital Liaison [phone]

## 2015-06-21 NOTE — Care Management Note (Signed)
Case Management Note  Patient Details  Name: Benjamin Riddle MRN: [id number] Date of Birth: [birth year]  Subjective/Objective:  PT-HH. Patient chose [facility]-TC rep [facility] LaShawnda aware of referral.Await HHPT, [id number] order.                  Action/Plan:d/c plan home w/HHC.   Expected Discharge Date:   (unknown)               Expected Discharge Plan:  Home w Home Health Services  In-House Referral:     Discharge planning Services  CM Consult  Post Acute Care Choice:    Choice offered to:  Patient  DME Arranged:    DME Agency:     HH Arranged:    HH Agency:  [facility]  Status of Service:  In process, will continue to follow  Medicare Important Message Given:  Yes Date Medicare IM Given:    Medicare IM give by:    Date Additional Medicare IM Given:    Additional Medicare Important Message give by:     If discussed at Long Length of Stay Meetings, dates discussed:    Additional Comments:  Lanier ClamMahabir, Haroldine Redler, RN 06/21/2015, 10:51 AM

## 2015-06-21 NOTE — Progress Notes (Signed)
Occupational Therapy Treatment Patient Details Name: Frankie Scipio MRN: [id number] DOB: [birth year] Today's Date: 06/21/2015    History of present illness 81 y.o. male with PMH of IDDM, asthma, COPD, CKD-III, DM, GERD, HTN, neurogenic bladder (doing self cath), osteoporosis, recurrent UTI, Hep C, combined systolic and diastolic congestive heart failure, who was admitted with UTI, sepsis, and a fall just PTA.    OT comments  Pt willing to participate; fatiques easily and rest breaks given.  Follow Up Recommendations  Home health OT;Supervision/Assistance - 24 hour    Equipment Recommendations  None recommended by OT    Recommendations for Other Services      Precautions / Restrictions Precautions Precautions: Fall Precaution Comments: fell PTA       Mobility Bed Mobility Overal bed mobility: Modified Independent             General bed mobility comments: HOB raised,  increased time, used siderail  Transfers   Equipment used: Rolling walker (2 wheeled) Transfers: Sit to/from Stand Sit to Stand: Min assist;From elevated surface         General transfer comment: steadying assistance    Balance                                   ADL       Grooming: Oral care;Min guard;Standing   Upper Body Bathing: Sitting;Minimal assitance   Lower Body Bathing: Minimal assistance;Sit to/from stand           Toilet Transfer: Minimal assistance;Ambulation;RW;Comfort height toilet             General ADL Comments: pt ambulated to bathroom with min A and completed bathing from 3:1 over commode.  Assis to hand washcloths to him and for legs. cues to sidestep through tight space      Vision                     Perception     Praxis      Cognition   Behavior During Therapy: [facility] for tasks assessed/performed Overall Cognitive Status: Within Functional Limits for tasks assessed                       Extremity/Trunk Assessment                Exercises     Shoulder Instructions       General Comments      Pertinent Vitals/ Pain       Pain Assessment: No/denies pain  Home Living                                          Prior Functioning/Environment              Frequency Min 2X/week     Progress Toward Goals  OT Goals(current goals can now be found in the care plan section)  Progress towards OT goals: Progressing toward goals (slowly)     Plan      Co-evaluation                 End of Session     Activity Tolerance Patient limited by fatigue   Patient Left in chair;with call bell/phone within reach;with chair alarm set;with family/visitor present   Nurse Communication  Time: [id number] OT Time Calculation (min): 23 min  Charges: OT General Charges $OT Visit: 1 Procedure OT Treatments $Self Care/Home Management : 23-37 mins  Romon Devereux 06/21/2015, 10:10 AM   Marica OtterMaryellen Demetris Meinhardt, OTR/L [phone] 06/21/2015

## 2015-06-21 NOTE — Care Management Important Message (Signed)
Important Message  Patient Details  Name: Benjamin Riddle MRN: [id number] Date of Birth: 06-Nov-1927   Medicare Important Message Given:  Yes    Haskell FlirtJamison, Kya Mayfield 06/21/2015, 9:24 AMImportant Message  Patient Details  Name: Benjamin Riddle MRN: [id number] Date of Birth: 06-Nov-1927   Medicare Important Message Given:  Yes    Haskell FlirtJamison, Ellenora Talton 06/21/2015, 9:24 AM

## 2015-06-21 NOTE — Progress Notes (Signed)
PROGRESS NOTE  Benjamin Riddle MWU:[id number] DOB: Jul 20, 1928 DOA: 06/18/2015 PCP: Ezequiel Kayser, MD  HPI/Recap of past 24 hours:   feeling better, now aaox3, request foley to be removed,   Assessment/Plan: Principal Problem:   UTI (lower urinary tract infection) Active Problems:   HLD (hyperlipidemia)   Asthmatic bronchitis , chronic (HCC)   COPD GOLD III    Combined systolic and diastolic congestive heart failure (HCC)   Acute renal failure superimposed on stage 4 chronic kidney disease (HCC)   Protein-calorie malnutrition, severe (HCC)   Bladder outlet obstruction   URI (upper respiratory infection)   Bronchitis   Sepsis (HCC)   CAP (community acquired pneumonia)  Drowsy: likely metabolic encephalopathy,  ct head no acute findings, resolved  UTI/g-rod bacteremia:     Fever subsided,  Leukocytosis resolved, final culture result + citrobacter, sensitive to cephalosporin, change rocephin to oral kflex.  H/o copd, cxr with bronchitis/pna changes, treated with rocephin/zithro, add duoneb, intermittent mild wheezing on exam, not in respiratory distress, abx changed to oral.  Insulin dependent diabetes: adjust insulin dose, a1c 10   ARf on CKDIII, likely from uti, continue treat uti, renal dosing meds, cr better  Bilateral pedal edema: from malnutrition?  venous US no DVT, echocardiogram unremarkable  Constipation: stool softener  FTT/progressive weakness/weight loss: PT/OT, home health   Code Status: full  Family Communication: patient and wife  Disposition Plan: home with home health 12/3   Consultants:  none  Procedures:  none  Antibiotics:  Rocephin/zithro from admission to 12/2  kflex from 12/2   Objective: BP 143/73 mmHg  Pulse 72  Temp(Src) 97.3 F (36.3 C) (Oral)  Resp 18  Ht  (1.727 m)  Wt 130 lb 8.2 oz (59.2 kg)  BMI 19.85 kg/m2  SpO2 98%  Intake/Output Summary (Last 24 hours) at 06/21/15 1947 Last data filed at 06/21/15 1853   Gross per 24 hour  Intake   1020 ml  Output   2650 ml  Net  -1630 ml   Filed Weights   06/18/15 2138 06/20/15 0457 06/21/15 0508  Weight: 125 lb 10.6 oz (57 kg) 128 lb 4.9 oz (58.2 kg) 130 lb 8.2 oz (59.2 kg)    Exam:   General:  Fully alert, aaox3  Cardiovascular: RRR  Respiratory: CTABL  Abdomen: Soft/ND/NT, positive BS  Musculoskeletal: bilateral pedal edema  Neuro: no focal deficit, fully alert, oriented x3  Data Reviewed: Basic Metabolic Panel:  Recent Labs Lab 06/18/15 1635 06/20/15 0446 06/21/15 0400  NA 138 137 140  K 4.6 4.4 4.8  CL 105 111 111  CO2 20* 19* 24  GLUCOSE 71 318* 240*  BUN 59* 50* 43*  CREATININE 2.59* 2.04* 1.76*  CALCIUM 8.9 7.8* 8.5*   Liver Function Tests:  Recent Labs Lab 06/18/15 1635 06/20/15 0446  AST 50* 22  ALT 18 14*  ALKPHOS 51 39  BILITOT 0.6 0.1*  PROT 7.6 5.4*  ALBUMIN 3.9 2.5*   No results for input(s): LIPASE, AMYLASE in the last 168 hours. No results for input(s): AMMONIA in the last 168 hours. CBC:  Recent Labs Lab 06/18/15 1635 06/21/15 0400  WBC 4.7 8.2  NEUTROABS 2.3  --   HGB 13.1 10.5*  HCT 39.5 32.2*  MCV 94.5 95.5  PLT 357 333   Cardiac Enzymes:   No results for input(s): CKTOTAL, CKMB, CKMBINDEX, TROPONINI in the last 168 hours. BNP (last 3 results) No results for input(s): BNP in the last 8760 hours.  ProBNP (last  3 results) No results for input(s): PROBNP in the last 8760 hours.  CBG:  Recent Labs Lab 06/20/15 1647 06/20/15 2146 06/21/15 0723 06/21/15 1150 06/21/15 1700  GLUCAP 148* 261* 131* 89 162*    Recent Results (from the past 240 hour(s))  Culture, blood (routine x 2)     Status: None   Collection Time: 06/18/15  4:38 PM  Result Value Ref Range Status   Specimen Description BLOOD RIGHT ANTECUBITAL  Final   Special Requests BOTTLES DRAWN AEROBIC AND ANAEROBIC 5CC  Final   Culture  Setup Time   Final    GRAM NEGATIVE RODS IN BOTH AEROBIC AND ANAEROBIC  BOTTLES CRITICAL RESULT CALLED TO, READ BACK BY AND VERIFIED WITH: AMilana Obey AT 0903 ON [id number] BY Lucienne Capers    Culture   Final    CITROBACTER KOSERI Performed at [facility]    Report Status 06/21/2015 FINAL  Final   Organism ID, Bacteria CITROBACTER KOSERI  Final      Susceptibility   Citrobacter koseri - MIC*    CEFAZOLIN <=4 SENSITIVE Sensitive     CEFEPIME <=1 SENSITIVE Sensitive     CEFTAZIDIME <=1 SENSITIVE Sensitive     CEFTRIAXONE <=1 SENSITIVE Sensitive     CIPROFLOXACIN <=0.25 SENSITIVE Sensitive     GENTAMICIN <=1 SENSITIVE Sensitive     IMIPENEM <=0.25 SENSITIVE Sensitive     TRIMETH/SULFA <=20 SENSITIVE Sensitive     PIP/TAZO <=4 SENSITIVE Sensitive     * CITROBACTER KOSERI  Urine culture     Status: None   Collection Time: 06/18/15  6:22 PM  Result Value Ref Range Status   Specimen Description URINE, CATHETERIZED  Final   Special Requests NONE  Final   Culture   Final    >=100,000 COLONIES/mL CITROBACTER KOSERI Performed at [facility]    Report Status 06/21/2015 FINAL  Final   Organism ID, Bacteria CITROBACTER KOSERI  Final      Susceptibility   Citrobacter koseri - MIC*    CEFAZOLIN <=4 SENSITIVE Sensitive     CEFTRIAXONE <=1 SENSITIVE Sensitive     CIPROFLOXACIN <=0.25 SENSITIVE Sensitive     GENTAMICIN <=1 SENSITIVE Sensitive     IMIPENEM <=0.25 SENSITIVE Sensitive     NITROFURANTOIN <=16 SENSITIVE Sensitive     TRIMETH/SULFA <=20 SENSITIVE Sensitive     PIP/TAZO <=4 SENSITIVE Sensitive     * >=100,000 COLONIES/mL CITROBACTER KOSERI  Culture, blood (routine x 2)     Status: None (Preliminary result)   Collection Time: 06/19/15  5:25 AM  Result Value Ref Range Status   Specimen Description BLOOD LEFT ARM  Final   Special Requests BOTTLES DRAWN AEROBIC AND ANAEROBIC 7CC  Final   Culture   Final    NO GROWTH 2 DAYS Performed at [facility]    Report Status PENDING  Incomplete  Culture, blood (routine x 2)     Status:  None (Preliminary result)   Collection Time: 06/20/15 11:20 AM  Result Value Ref Range Status   Specimen Description BLOOD LEFT ARM  Final   Special Requests BOTTLES DRAWN AEROBIC AND ANAEROBIC 10CC EACH  Final   Culture   Final    NO GROWTH < 24 HOURS Performed at [facility]    Report Status PENDING  Incomplete  Culture, blood (routine x 2)     Status: None (Preliminary result)   Collection Time: 06/20/15 11:23 AM  Result Value Ref Range Status   Specimen Description BLOOD  LEFT HAND  Final   Special Requests BOTTLES DRAWN AEROBIC AND ANAEROBIC 5CC EACH  Final   Culture   Final    NO GROWTH < 24 HOURS [facility]    Report Status PENDING  Incomplete     Studies: No results found.  Scheduled Meds: . acetaminophen  1,000 mg Oral Once  . cephALEXin  500 mg Oral Q12H  . cholecalciferol  1,000 Units Oral Daily  . feeding supplement (GLUCERNA SHAKE)  237 mL Oral TID BM  . feeding supplement (PRO-STAT SUGAR FREE 64)  30 mL Oral q morning - 10a  . guaiFENesin  600 mg Oral BID  . heparin  5,000 Units Subcutaneous 3 times per day  . insulin aspart  0-20 Units Subcutaneous TID WC  . insulin aspart  5 Units Subcutaneous TID WC  . insulin glargine  20 Units Subcutaneous QHS  . ipratropium-albuterol  3 mL Nebulization BID  . polyethylene glycol  17 g Oral Daily  . senna-docusate  2 tablet Oral BID  . simvastatin  40 mg Oral q1800  . URELLE  1 tablet Oral BID    Continuous Infusions:    Time spent: 25mins  Beverlie Kurihara MD, PhD  [facility] Pager [phone]. If 7PM-7AM, please contact night-coverage at [email or web address], password [email or web address] 06/21/2015, 7:47 PM  LOS: 3 days

## 2015-06-22 LAB — GLUCOSE, CAPILLARY
GLUCOSE-CAPILLARY: 251 mg/dL — AB (ref 65–99)
Glucose-Capillary: 187 mg/dL — ABNORMAL HIGH (ref 65–99)
Glucose-Capillary: 226 mg/dL — ABNORMAL HIGH (ref 65–99)

## 2015-06-22 LAB — BASIC METABOLIC PANEL
Anion gap: 9 (ref 5–15)
BUN: 40 mg/dL — AB (ref 6–20)
CALCIUM: 9.1 mg/dL (ref 8.9–10.3)
CO2: 21 mmol/L — AB (ref 22–32)
Chloride: 109 mmol/L (ref 101–111)
Creatinine, Ser: 1.71 mg/dL — ABNORMAL HIGH (ref 0.61–1.24)
GFR calc Af Amer: 40 mL/min — ABNORMAL LOW (ref >60)
GFR, EST NON AFRICAN AMERICAN: 35 mL/min — AB (ref >60)
GLUCOSE: 221 mg/dL — AB (ref 65–99)
Potassium: 4.5 mmol/L (ref 3.5–5.1)
Sodium: 139 mmol/L (ref 135–145)

## 2015-06-22 MED ORDER — GLUCERNA SHAKE PO LIQD: 237.0000 mL | Freq: Three times a day (TID) | ORAL | Status: DC

## 2015-06-22 MED ORDER — ZOLPIDEM TARTRATE 5 MG PO TABS: 5.0000 mg | ORAL_TABLET | Freq: Every evening | ORAL | Status: DC | PRN

## 2015-06-22 MED ORDER — CEPHALEXIN 500 MG PO CAPS
500.0000 mg | ORAL_CAPSULE | Freq: Two times a day (BID) | ORAL | Status: DC
Start: 2015-06-22 — End: 2015-08-06

## 2015-06-22 NOTE — Discharge Summary (Signed)
Discharge Summary  Benjamin Riddle ZHY:[id number] DOB: 1927/07/26  PCP: Ezequiel Kayser, MD  Admit date: 06/18/2015 Discharge date: 06/22/2015  Time spent: >56mins  Recommendations for Outpatient Follow-up:  1. F/u with PMD within a week for hospital discharge follow up, repeat cbc/bmp at follow up.  Discharge Diagnoses:  Active Hospital Problems   Diagnosis Date Noted  . UTI (lower urinary tract infection)   . URI (upper respiratory infection) 06/18/2015  . Bronchitis 06/18/2015  . Sepsis (HCC) 06/18/2015  . CAP (community acquired pneumonia) 06/18/2015  . Bladder outlet obstruction 04/06/2015  . Protein-calorie malnutrition, severe (HCC) 01/25/2015  . Acute renal failure superimposed on stage 4 chronic kidney disease (HCC) 01/23/2015  . Combined systolic and diastolic congestive heart failure (HCC)   . COPD GOLD III  11/23/2014  . Asthmatic bronchitis , chronic (HCC) 08/31/2014  . HLD (hyperlipidemia) 12/09/2009    Resolved Hospital Problems   Diagnosis Date Noted Date Resolved  No resolved problems to display.    Discharge Condition: stable  Diet recommendation: heart healthy/carb modified  Filed Weights   06/20/15 0457 06/21/15 0508 06/22/15 0549  Weight: 128 lb 4.9 oz (58.2 kg) 130 lb 8.2 oz (59.2 kg) 132 lb 7.9 oz (60.1 kg)    History of present illness:  Benjamin Riddle is a 81 y.o. male with PMH of I's and but had often late obstruction, neurogenic bladder with self cath, hypertension, hyperlipidemia, diabetes mellitus, COPD, asthma, osteoporosis, hypogonadism, HCV, combined systolic and diastolic congestive heart failure, CKD-IV, who presents with generalized weakness, fever and fall.  Patient reports that he has been having generalized weakness in the past several days. He has poor appetite and decreased energy. He lost approximately 20-25 pounds over 3 month period of time. He had fall today when ambulating to kitchen with walker assistance. No loss of  consciousness, chest pain, dizziness, lightheadedness. No injury. Patient reports that he noticed blood clot in his urinary history. He is doing self cath, denies dysuria, burning on urination. Patient does not have cough, shortness of breath, abdominal pain, diarrhea, unilateral weakness, rashes, hematochezia, hematemesis.  In ED, patient was found to have positive urinalysis with moderate amount of leukocytes, WBC 4.7, lactate 2.0 --> 0.63, temperature 102.2, no tachycardia, mildly tachypnea, worsening renal function. Chest x-ray showed possible bronchitis, potentially with developing multilobar bronchopneumonia most evident the mid to upper lungs.    Hospital Course:  Principal Problem:   UTI (lower urinary tract infection) Active Problems:   HLD (hyperlipidemia)   Asthmatic bronchitis , chronic (HCC)   COPD GOLD III    Combined systolic and diastolic congestive heart failure (HCC)   Acute renal failure superimposed on stage 4 chronic kidney disease (HCC)   Protein-calorie malnutrition, severe (HCC)   Bladder outlet obstruction   URI (upper respiratory infection)   Bronchitis   Sepsis (HCC)   CAP (community acquired pneumonia)  metabolic encephalopathy with drowsiness, ct head no acute findings, resolved  UTI/g-rod bacteremia:   Fever subsided, Leukocytosis resolved, final culture result + citrobacter, sensitive to cephalosporin, change rocephin to oral kflex.  H/o copd, cxr with bronchitis/pna changes, treated with rocephin/zithro, add duoneb, intermittent mild wheezing on exam, not in respiratory distress, abx changed to oral. On room air.  Insulin dependent diabetes: adjust insulin dose, a1c 10   ARf on CKDIII, likely from uti, continue treat uti, renal dosing meds, cr better, repeat labs in a week at pmd follow up.  Bilateral pedal edema: from malnutrition? venous US no DVT, echocardiogram unremarkable  Constipation:  stool softener  FTT/progressive weakness/weight  loss: PT/OT, home health   Neurogenic bladder, self catheterization.  Code Status: full  Family Communication: patient and wife  Disposition Plan: home with home health 12/3   Consultants:  none  Procedures:  none  Antibiotics:  Rocephin/zithro from admission to 12/2  kflex from 12/2  Discharge Exam: BP 135/69 mmHg  Pulse 82  Temp(Src) 98.4 F (36.9 C) (Oral)  Resp 16  Ht  (1.727 m)  Wt 132 lb 7.9 oz (60.1 kg)  BMI 20.15 kg/m2  SpO2 94%   General: Fully alert, aaox3  Cardiovascular: RRR  Respiratory: CTABL  Abdomen: Soft/ND/NT, positive BS  Musculoskeletal: bilateral pedal edema  Neuro: no focal deficit, fully alert, oriented x3   Discharge Instructions You were cared for by a hospitalist during your hospital stay. If you have any questions about your discharge medications or the care you received while you were in the hospital after you are discharged, you can call the unit and asked to speak with the hospitalist on call if the hospitalist that took care of you is not available. Once you are discharged, your primary care physician will handle any further medical issues. Please note that NO REFILLS for any discharge medications will be authorized once you are discharged, as it is imperative that you return to your primary care physician (or establish a relationship with a primary care physician if you do not have one) for your aftercare needs so that they can reassess your need for medications and monitor your lab values.  Discharge Instructions    Diet - low sodium heart healthy    Complete by:  As directed   Carb modified     Face-to-face encounter (required for Medicare/Medicaid patients)    Complete by:  As directed   I Lilas Diefendorf certify that this patient is under my care and that I, or a nurse practitioner or physician's assistant working with me, had a face-to-face encounter that meets the physician face-to-face encounter requirements with this  patient on 06/21/2015. The encounter with the patient was in whole, or in part for the following medical condition(s) which is the primary reason for home health care (List medical condition): FTT  The encounter with the patient was in whole, or in part, for the following medical condition, which is the primary reason for home health care:  FTt  I certify that, based on my findings, the following services are medically necessary home health services:  Nursing  Reason for Medically Necessary Home Health Services:  Skilled Nursing- Change/Decline in Patient Status  My clinical findings support the need for the above services:  Unsafe ambulation due to balance issues  Further, I certify that my clinical findings support that this patient is homebound due to:  Unsafe ambulation due to balance issues     Home Health    Complete by:  As directed   To provide the following care/treatments:  PT     Increase activity slowly    Complete by:  As directed             Medication List    TAKE these medications        albuterol (2.5 MG/3ML) 0.083% NEBU 3 mL, albuterol (5 MG/ML) 0.5% NEBU 0.5 mL  Inhale 5 mg into the lungs every 6 (six) hours as needed (wheezing).     albuterol 90 MCG/ACT inhaler  Commonly known as:  PROVENTIL,VENTOLIN  Inhale 2 puffs into the lungs every 4 (four) hours as  needed for wheezing or shortness of breath.     cephALEXin 500 MG capsule  Commonly known as:  KEFLEX  Take 1 capsule (500 mg total) by mouth every 12 (twelve) hours.     cholecalciferol 1000 UNITS tablet  Commonly known as:  VITAMIN D  Take 1,000 Units by mouth daily.     feeding supplement (GLUCERNA SHAKE) Liqd  Take 237 mLs by mouth 3 (three) times daily between meals.     insulin lispro 100 UNIT/ML injection  Commonly known as:  HUMALOG  Inject 4-16 Units into the skin 3 (three) times daily as needed for high blood sugar (high blood sugar). Injects 8 units at breakfast, 4 units lunch, 8 to 10 units at  dinner  Adjusted to blood glucose level. Patient adds an additional unit for every /dL     saccharomyces boulardii 250 MG capsule  Commonly known as:  FLORASTOR  Take 1 capsule (250 mg total) by mouth 2 (two) times daily.     simvastatin 40 MG tablet  Commonly known as:  ZOCOR  Take 40 mg by mouth daily.     Tiotropium Bromide-Olodaterol 2.5-2.5 MCG/ACT Aers  Commonly known as:  STIOLTO RESPIMAT  Inhale 2 puffs into the lungs daily.     TOUJEO SOLOSTAR 300 UNIT/ML Sopn  Generic drug:  Insulin Glargine  Inject 14 Units into the skin at bedtime.     URIBEL 118 MG Caps  Take 1 capsule by mouth 2 (two) times daily.     zolpidem 5 MG tablet  Commonly known as:  AMBIEN  Take 1 tablet (5 mg total) by mouth at bedtime as needed for sleep.       Allergies  Allergen Reactions  . Codeine Nausea And Vomiting  . Gentamicin     Becomes dizzy and weak  . Ciprofloxacin Itching and Rash       Follow-up Information    Follow up with PERINI,MARK A, MD In 2 weeks.   Specialty:  Internal Medicine   Why:  hospital discharge follow up, repeat cbc /bmp at follow up   Contact information:   [address] [place] [address] [phone]        The results of significant diagnostics from this hospitalization (including imaging, microbiology, ancillary and laboratory) are listed below for reference.    Significant Diagnostic Studies: Ct Head Wo Contrast  06/19/2015  CLINICAL DATA:  Confusion EXAM: CT HEAD WITHOUT CONTRAST TECHNIQUE: Contiguous axial images were obtained from the base of the skull through the vertex without intravenous contrast. COMPARISON:  Head CT June 14, 2005 and brain MRI November 04 2013 FINDINGS: There is generalized ventricular enlargement, stable compared to prior studies. The sulci are mildly prominent, stable. There is no intracranial mass, hemorrhage, or extra-axial fluid collection. No midline shift. There is a prior small lacunar infarct in the head  of the caudate nucleus on the right. Elsewhere gray-white compartments appear normal. No acute infarct is evident. The bony calvarium shows evidence of an old right frontal bone fracture, healed. There is evidence of prior anterior facial trauma with remodeling and packing with fat. No intraorbital lesions are appreciated. Mastoid air cells are clear. IMPRESSION: Diffuse ventricular enlargement is a stable finding. This degree of ventricular enlargement, disproportionate to the sulci, raises question of normal pressure hydrocephalus. The chronicity of this finding is uncertain with respect to potential normal pressure hydrocephalus. Prior small lacunar infarct in the head of the caudate nucleus on the right. No hemorrhage, intra-axial mass,  or acute appearing infarct. Extensive postoperative change in the upper facial region. Old healed fracture right frontal bone. No acute fracture evident. Mastoid air cells are clear. Electronically Signed   By: Bretta BangWilliam  Woodruff III M.D.   On: 06/19/2015 15:01   [place] Renal  06/18/2015  CLINICAL DATA:  Acute kidney injury. EXAM: RENAL / URINARY TRACT ULTRASOUND COMPLETE COMPARISON:  CT 01/04/2015 FINDINGS: Right Kidney: Length: 12.2 cm. Generalized parenchymal echogenicity consistent with chronic medical renal disease. Cortical thinning. Moderate pelvic caliectasis and large extrarenal pelvis, unchanged from the 01/04/2015 CT. Left Kidney: Length: 10.1 cm. Generalized parenchymal echogenicity consistent with chronic medical renal disease. No hydronephrosis. Cortical thinning and irregularity, unchanged from the CT of 01/04/2015. Bladder: Collapsed around a Foley catheter. IMPRESSION: Both kidneys are atrophic and irregular with increased echogenicity consistent with chronic medical renal disease. No convincing interval change from 01/04/2015 CT. Electronically Signed   By: Ellery Plunkaniel R Mitchell M.D.   On: 06/18/2015 21:50   Dg Pelvis Portable  06/18/2015  CLINICAL DATA:  Larey SeatFell  today, no pelvic pain EXAM: PORTABLE PELVIS 1-2 VIEWS COMPARISON:  None. FINDINGS: There is no evidence of pelvic fracture or diastasis. No pelvic bone lesions are seen. IMPRESSION: Negative. Electronically Signed   By: Esperanza Heiraymond  Rubner M.D.   On: 06/18/2015 17:41   Dg Chest Port 1 View  06/18/2015  CLINICAL DATA:  81 year old male who presenting with sepsis. History of asthma. EXAM: PORTABLE CHEST 1 VIEW COMPARISON:  Chest x-ray 04/23/2015. FINDINGS: Diffuse peribronchial cuffing and hazy opacities most evident in the mid to upper lungs bilaterally. No pleural effusions. No evidence of pulmonary edema. Heart size is normal. Upper mediastinal contours are within normal limits. Atherosclerotic calcifications in the thoracic aorta. IMPRESSION: 1. Appearance of the chest suggests bronchitis, potentially with developing multilobar bronchopneumonia most evident the mid to upper lungs, as above. 2. Atherosclerosis. Electronically Signed   By: Trudie Reedaniel  Entrikin M.D.   On: 06/18/2015 17:48    Microbiology: Recent Results (from the past 240 hour(s))  Culture, blood (routine x 2)     Status: None   Collection Time: 06/18/15  4:38 PM  Result Value Ref Range Status   Specimen Description BLOOD RIGHT ANTECUBITAL  Final   Special Requests BOTTLES DRAWN AEROBIC AND ANAEROBIC 5CC  Final   Culture  Setup Time   Final    GRAM NEGATIVE RODS IN BOTH AEROBIC AND ANAEROBIC BOTTLES CRITICAL RESULT CALLED TO, READ BACK BY AND VERIFIED WITH: AMilana Obey. JOHNSON,RN AT 0903 ON [phone] BY Lucienne CapersS. YARBROUGH    Culture   Final    CITROBACTER KOSERI [facility]    Report Status 06/21/2015 FINAL  Final   Organism ID, Bacteria CITROBACTER KOSERI  Final      Susceptibility   Citrobacter koseri - MIC*    CEFAZOLIN <=4 SENSITIVE Sensitive     CEFEPIME <=1 SENSITIVE Sensitive     CEFTAZIDIME <=1 SENSITIVE Sensitive     CEFTRIAXONE <=1 SENSITIVE Sensitive     CIPROFLOXACIN <=0.25 SENSITIVE Sensitive     GENTAMICIN <=1  SENSITIVE Sensitive     IMIPENEM <=0.25 SENSITIVE Sensitive     TRIMETH/SULFA <=20 SENSITIVE Sensitive     PIP/TAZO <=4 SENSITIVE Sensitive     * CITROBACTER KOSERI  Urine culture     Status: None   Collection Time: 06/18/15  6:22 PM  Result Value Ref Range Status   Specimen Description URINE, CATHETERIZED  Final   Special Requests NONE  Final   Culture   Final    >=  100,000 COLONIES/mL CITROBACTER KOSERI Performed at [facility]    Report Status 06/21/2015 FINAL  Final   Organism ID, Bacteria CITROBACTER KOSERI  Final      Susceptibility   Citrobacter koseri - MIC*    CEFAZOLIN <=4 SENSITIVE Sensitive     CEFTRIAXONE <=1 SENSITIVE Sensitive     CIPROFLOXACIN <=0.25 SENSITIVE Sensitive     GENTAMICIN <=1 SENSITIVE Sensitive     IMIPENEM <=0.25 SENSITIVE Sensitive     NITROFURANTOIN <=16 SENSITIVE Sensitive     TRIMETH/SULFA <=20 SENSITIVE Sensitive     PIP/TAZO <=4 SENSITIVE Sensitive     * >=100,000 COLONIES/mL CITROBACTER KOSERI  Culture, blood (routine x 2)     Status: None (Preliminary result)   Collection Time: 06/19/15  5:25 AM  Result Value Ref Range Status   Specimen Description BLOOD LEFT ARM  Final   Special Requests BOTTLES DRAWN AEROBIC AND ANAEROBIC 7CC  Final   Culture   Final    NO GROWTH 2 DAYS Performed at [facility]    Report Status PENDING  Incomplete  Culture, blood (routine x 2)     Status: None (Preliminary result)   Collection Time: 06/20/15 11:20 AM  Result Value Ref Range Status   Specimen Description BLOOD LEFT ARM  Final   Special Requests BOTTLES DRAWN AEROBIC AND ANAEROBIC 10CC EACH  Final   Culture   Final    NO GROWTH < 24 HOURS Performed at [facility]    Report Status PENDING  Incomplete  Culture, blood (routine x 2)     Status: None (Preliminary result)   Collection Time: 06/20/15 11:23 AM  Result Value Ref Range Status   Specimen Description BLOOD LEFT HAND  Final   Special Requests BOTTLES DRAWN AEROBIC AND  ANAEROBIC 5CC EACH  Final   Culture   Final    NO GROWTH < 24 HOURS Performed at [facility]    Report Status PENDING  Incomplete     Labs: Basic Metabolic Panel:  Recent Labs Lab 06/18/15 1635 06/20/15 0446 06/21/15 0400 06/22/15 0525  NA 138 137 140 139  K 4.6 4.4 4.8 4.5  CL 105 111 111 109  CO2 20* 19* 24 21*  GLUCOSE 71 318* 240* 221*  BUN 59* 50* 43* 40*  CREATININE 2.59* 2.04* 1.76* 1.71*  CALCIUM 8.9 7.8* 8.5* 9.1   Liver Function Tests:  Recent Labs Lab 06/18/15 1635 06/20/15 0446  AST 50* 22  ALT 18 14*  ALKPHOS 51 39  BILITOT 0.6 0.1*  PROT 7.6 5.4*  ALBUMIN 3.9 2.5*   No results for input(s): LIPASE, AMYLASE in the last 168 hours. No results for input(s): AMMONIA in the last 168 hours. CBC:  Recent Labs Lab 06/18/15 1635 06/21/15 0400  WBC 4.7 8.2  NEUTROABS 2.3  --   HGB 13.1 10.5*  HCT 39.5 32.2*  MCV 94.5 95.5  PLT 357 333   Cardiac Enzymes: No results for input(s): CKTOTAL, CKMB, CKMBINDEX, TROPONINI in the last 168 hours. BNP: BNP (last 3 results) No results for input(s): BNP in the last 8760 hours.  ProBNP (last 3 results) No results for input(s): PROBNP in the last 8760 hours.  CBG:  Recent Labs Lab 06/20/15 2146 06/21/15 0723 06/21/15 1150 06/21/15 1700 06/21/15 2110  GLUCAP 261* 131* 89 162* 251*       Signed:  Woodroe Vogan MD, PhD  [facility] 06/22/2015, 11:40 AM

## 2015-06-24 LAB — CULTURE, BLOOD (ROUTINE X 2): Culture: NO GROWTH

## 2015-06-24 NOTE — Care Management Note (Addendum)
Case Management Note  Patient Details  Name: Arrie AranJerry Asato MRN: [id number] Date of Birth: 1928/04/21  Subjective/Objective:    UTI              Action/Plan: HH arranged with [facility], faxed orders to [facility] on 06/23/2015 and contacted oncall RN, requested NCM follow up on 06/24/2015. Contacted office today and fax with orders, facesheet, [id number] and discharge summary were received. [facility]   Expected Discharge Date:   (unknown)               Expected Discharge Plan:  Home w Home Health Services  In-House Referral:     Discharge planning Services  CM Consult  Post Acute Care Choice:  Home Health Choice offered to:  Patient   HH [facility]  [facility]  [facility]  Status of Service:  Completed, signed off  Medicare Important Message Given:  Yes Date Medicare IM Given:    Medicare IM give by:    Date Additional Medicare IM Given:    Additional Medicare Important Message give by:     If discussed at Long Length of Stay Meetings, dates discussed:    Additional Comments:  Elliot CousinShavis, Ayyub Krall Ellen, RN 06/24/2015, 9:34 AM

## 2015-06-25 LAB — CULTURE, BLOOD (ROUTINE X 2)
CULTURE: NO GROWTH
CULTURE: NO GROWTH

## 2015-06-26 ENCOUNTER — Telehealth: Payer: Self-pay | Admitting: Internal Medicine

## 2015-06-26 DIAGNOSIS — K641 Second degree hemorrhoids: Secondary | ICD-10-CM | POA: Diagnosis not present

## 2015-06-26 MED ORDER — CEFUROXIME AXETIL 500 MG PO TABS: 500.0000 mg | ORAL_TABLET | Freq: Two times a day (BID) | ORAL | Status: DC

## 2015-06-26 NOTE — Telephone Encounter (Signed)
Mr. Benjamin Riddle called the office as he has UTI symptoms still. I spoke with him he still have cloudy urine and frequency and pain when he inserts the catheter. I spoke with the pharmacist, recommended either to increase Keflex to 500 mg 4 times a day or switched to Ceftin. I switched him to Ceftin 500 mg twice a day, the prescription faxed and phoned to write a [address].  Benjamin Riddle Pager: [phone] 06/26/2015, 10:41 AM

## 2015-07-02 ENCOUNTER — Ambulatory Visit: Payer: Medicare Other | Admitting: Internal Medicine

## 2015-07-02 DIAGNOSIS — R8299 Other abnormal findings in urine: Secondary | ICD-10-CM | POA: Diagnosis not present

## 2015-07-02 DIAGNOSIS — N390 Urinary tract infection, site not specified: Secondary | ICD-10-CM | POA: Diagnosis not present

## 2015-07-08 DIAGNOSIS — E1065 Type 1 diabetes mellitus with hyperglycemia: Secondary | ICD-10-CM | POA: Diagnosis not present

## 2015-07-08 DIAGNOSIS — E7800 Pure hypercholesterolemia, unspecified: Secondary | ICD-10-CM | POA: Diagnosis not present

## 2015-07-09 DIAGNOSIS — E7800 Pure hypercholesterolemia, unspecified: Secondary | ICD-10-CM | POA: Diagnosis not present

## 2015-07-09 DIAGNOSIS — E1065 Type 1 diabetes mellitus with hyperglycemia: Secondary | ICD-10-CM | POA: Diagnosis not present

## 2015-07-09 DIAGNOSIS — I10 Essential (primary) hypertension: Secondary | ICD-10-CM | POA: Diagnosis not present

## 2015-07-09 DIAGNOSIS — R5382 Chronic fatigue, unspecified: Secondary | ICD-10-CM | POA: Diagnosis not present

## 2015-07-09 DIAGNOSIS — E1022 Type 1 diabetes mellitus with diabetic chronic kidney disease: Secondary | ICD-10-CM | POA: Diagnosis not present

## 2015-07-09 DIAGNOSIS — E440 Moderate protein-calorie malnutrition: Secondary | ICD-10-CM | POA: Diagnosis not present

## 2015-07-19 DIAGNOSIS — Z125 Encounter for screening for malignant neoplasm of prostate: Secondary | ICD-10-CM | POA: Diagnosis not present

## 2015-07-19 DIAGNOSIS — N390 Urinary tract infection, site not specified: Secondary | ICD-10-CM | POA: Diagnosis not present

## 2015-07-19 DIAGNOSIS — R8299 Other abnormal findings in urine: Secondary | ICD-10-CM | POA: Diagnosis not present

## 2015-07-19 DIAGNOSIS — E1010 Type 1 diabetes mellitus with ketoacidosis without coma: Secondary | ICD-10-CM | POA: Diagnosis not present

## 2015-07-19 DIAGNOSIS — N183 Chronic kidney disease, stage 3 (moderate): Secondary | ICD-10-CM | POA: Diagnosis not present

## 2015-07-19 DIAGNOSIS — E784 Other hyperlipidemia: Secondary | ICD-10-CM | POA: Diagnosis not present

## 2015-07-26 LAB — HEMOGLOBIN A1C: Hemoglobin A1C: 9.5

## 2015-08-02 ENCOUNTER — Ambulatory Visit: Payer: Medicare Other | Admitting: Internal Medicine

## 2015-08-06 ENCOUNTER — Encounter (HOSPITAL_COMMUNITY): Payer: Self-pay

## 2015-08-06 ENCOUNTER — Emergency Department (HOSPITAL_COMMUNITY)
Admission: EM | Admit: 2015-08-06 | Discharge: 2015-08-06 | Disposition: A | Discharge status: Home / Self Care | Payer: Medicare Other | Attending: Emergency Medicine | Admitting: Emergency Medicine

## 2015-08-06 DIAGNOSIS — Z792 Long term (current) use of antibiotics: Secondary | ICD-10-CM | POA: Insufficient documentation

## 2015-08-06 DIAGNOSIS — Z8619 Personal history of other infectious and parasitic diseases: Secondary | ICD-10-CM | POA: Insufficient documentation

## 2015-08-06 DIAGNOSIS — E162 Hypoglycemia, unspecified: Secondary | ICD-10-CM

## 2015-08-06 DIAGNOSIS — R011 Cardiac murmur, unspecified: Secondary | ICD-10-CM | POA: Diagnosis not present

## 2015-08-06 DIAGNOSIS — Z794 Long term (current) use of insulin: Secondary | ICD-10-CM | POA: Diagnosis not present

## 2015-08-06 DIAGNOSIS — I5040 Unspecified combined systolic (congestive) and diastolic (congestive) heart failure: Secondary | ICD-10-CM | POA: Insufficient documentation

## 2015-08-06 DIAGNOSIS — N183 Chronic kidney disease, stage 3 (moderate): Secondary | ICD-10-CM | POA: Diagnosis not present

## 2015-08-06 DIAGNOSIS — Z79899 Other long term (current) drug therapy: Secondary | ICD-10-CM | POA: Diagnosis not present

## 2015-08-06 DIAGNOSIS — E785 Hyperlipidemia, unspecified: Secondary | ICD-10-CM | POA: Insufficient documentation

## 2015-08-06 DIAGNOSIS — I129 Hypertensive chronic kidney disease with stage 1 through stage 4 chronic kidney disease, or unspecified chronic kidney disease: Secondary | ICD-10-CM | POA: Diagnosis not present

## 2015-08-06 DIAGNOSIS — Z87440 Personal history of urinary (tract) infections: Secondary | ICD-10-CM | POA: Insufficient documentation

## 2015-08-06 DIAGNOSIS — Z87891 Personal history of nicotine dependence: Secondary | ICD-10-CM | POA: Diagnosis not present

## 2015-08-06 DIAGNOSIS — Z8669 Personal history of other diseases of the nervous system and sense organs: Secondary | ICD-10-CM | POA: Diagnosis not present

## 2015-08-06 DIAGNOSIS — E11649 Type 2 diabetes mellitus with hypoglycemia without coma: Secondary | ICD-10-CM | POA: Insufficient documentation

## 2015-08-06 DIAGNOSIS — J449 Chronic obstructive pulmonary disease, unspecified: Secondary | ICD-10-CM | POA: Diagnosis not present

## 2015-08-06 DIAGNOSIS — M810 Age-related osteoporosis without current pathological fracture: Secondary | ICD-10-CM | POA: Diagnosis not present

## 2015-08-06 LAB — CBG MONITORING, ED
Glucose-Capillary: 84 mg/dL (ref 65–99)
Glucose-Capillary: 84 mg/dL (ref 65–99)

## 2015-08-06 NOTE — ED Notes (Signed)
Bed: ZO10 Expected date:  Expected time:  Means of arrival:  Comments: EMS- 82yo M, hypoglycemia/current CBG

## 2015-08-06 NOTE — ED Notes (Signed)
Per EMS, Pt, from home, c/o hypoglycemia.  Denies pain.  EMS reports Pt was found to have CBG 42.  Sts that they were previously called out around 0400, due to Pt having CBG 40.  25G D50 given this morning and en route.  CBG 252.  Pt reports eating breakfast.  Hx of DM Type 2.

## 2015-08-06 NOTE — Discharge Instructions (Signed)
Hypoglycemia Low blood sugar (hypoglycemia) means that the level of sugar in your blood is lower than it should be. Signs of low blood sugar include:  Getting sweaty.  Feeling hungry.  Feeling dizzy or weak.  Feeling sleepier than normal.  Feeling nervous.  Headaches.  Having a fast heartbeat. Low blood sugar can happen fast and can be an emergency. Your doctor can do tests to check your blood sugar level. You can have low blood sugar and not have diabetes. HOME CARE  Check your blood sugar as told by your doctor. If it is less than 70 mg/dl or as told by your doctor, take 1 of the following:  3 to 4 glucose tablets.   cup clear juice.   cup soda pop, not diet.  1 cup milk.  5 to 6 hard candies.  Recheck blood sugar after 15 minutes. Repeat until it is at the right level.  Eat a snack if it is more than 1 hour until the next meal.  Only take medicine as told by your doctor.  Do not skip meals. Eat on time.  Do not drink alcohol except with meals.  Check your blood glucose before driving.  Check your blood glucose before and after exercise.  Always carry treatment with you, such as glucose pills.  Always wear a medical alert bracelet if you have diabetes. GET HELP RIGHT AWAY IF:   Your blood glucose goes below 70 mg/dl or as told by your doctor, and you:  Are confused.  Are not able to swallow.  Pass out (faint).  You cannot treat yourself. You may need someone to help you.  You have low blood sugar problems often.  You have problems from your medicines.  You are not feeling better after 3 to 4 days.  You have vision changes. MAKE SURE YOU:   Understand these instructions.  Will watch this condition.  Will get help right away if you are not doing well or get worse.   This information is not intended to replace advice given to you by your health care provider. Make sure you discuss any questions you have with your health care provider.     Document Released: 09/30/2009 Document Revised: 07/27/2014 Document Reviewed: 03/12/2015 Elsevier Interactive Patient Education 2016 [employer]  

## 2015-08-06 NOTE — ED Provider Notes (Signed)
CSN: [id number]     Arrival date & time 08/06/15  1416 History   First MD Initiated Contact with Patient 08/06/15 1503     Chief Complaint  Patient presents with  . Hypoglycemia     (Consider location/radiation/quality/duration/timing/severity/associated sxs/prior Treatment) Patient is a 82 y.o. male presenting with hypoglycemia.  Hypoglycemia Severity:  Moderate Onset quality:  Sudden Duration:  2 hours Timing:  Constant Progression:  Resolved Chronicity:  New Diabetic status:  Controlled with insulin Time since last antidiabetic medication:  6 hours Relieved by:  Nothing Ineffective treatments:  None tried Associated symptoms: weakness   Associated symptoms: no shortness of breath, no tremors and no vomiting     82 yo M with a chief complaint of hypoglycemia. Patient states he took his Humalog and then did not eat for a couple hours. After eating felt much better. Initial blood sugar was 40. Patient denies any symptoms currently. Think he may have double doses Humalog today. Denies any oral hypoglycemics. Denies any injury from having low blood sugar. Denies any other complaints at this time. States this was not a suicide attempt.  Past Medical History  Diagnosis Date  . Diabetes mellitus   . Asthma   . Hydronephrosis   . Kidney disease     Stage III chronic kidney disease  . Hypertension   . Neurogenic bladder   . Osteoporosis   . Hypogonadism male   . Recurrent urinary tract infection     With resistent Pseudomonas  . Allergic rhinitis   . Hepatitis C     due to blood transfusion  . Hyperlipidemia   . SOB (shortness of breath)   . CKD (chronic kidney disease), stage III   . Combined systolic and diastolic congestive heart failure (HCC)   . COPD (chronic obstructive pulmonary disease) (HCC)   . Allergy   . Cataract   . Heart murmur    Past Surgical History  Procedure Laterality Date  . Hernia repair    . Tonsillectomy    . Bladder repair      obstruction  surgery  . Frontal sinus obliteration      sinus repaired with a plate  . Cataract extraction  2015  . Eye surgery    . Fracture surgery    . Prostate surgery    . Spine surgery     Family History  Problem Relation Age of Onset  . Heart failure Mother   . Hypertension Mother   . COPD Father     smoked  . Diabetes Father   . Cancer Sister     Breast  . Diabetes Sister   . COPD Brother     smoked   Social History  Substance Use Topics  . Smoking status: Former Smoker    Types: Cigars, Cigarettes    Quit date: 07/21/2011  . Smokeless tobacco: None     Comment: smoked occ cigar and pipe  . Alcohol Use: No    Review of Systems  Constitutional: Negative for fever and chills.  HENT: Negative for congestion and facial swelling.   Eyes: Negative for discharge and visual disturbance.  Respiratory: Negative for shortness of breath.   Cardiovascular: Negative for chest pain and palpitations.  Gastrointestinal: Negative for vomiting, abdominal pain and diarrhea.  Musculoskeletal: Negative for myalgias and arthralgias.  Skin: Negative for color change and rash.  Neurological: Positive for weakness. Negative for tremors, syncope and headaches.  Psychiatric/Behavioral: Negative for confusion and dysphoric mood.  Allergies  Codeine; Gentamicin; and Ciprofloxacin  Home Medications   Prior to Admission medications   Medication Sig Start Date End Date Taking? Authorizing Provider  albuterol (2.5 MG/3ML) 0.083% NEBU 3 mL, albuterol (5 MG/ML) 0.5% NEBU 0.5 mL Inhale 5 mg into the lungs every 6 (six) hours as needed (wheezing).   Yes Historical Provider, MD  albuterol (PROVENTIL,VENTOLIN) 90 MCG/ACT inhaler Inhale 2 puffs into the lungs every 4 (four) hours as needed for wheezing or shortness of breath.    Yes Historical Provider, MD  cephALEXin (KEFLEX) 250 MG capsule Take 250 mg by mouth daily.   Yes Historical Provider, MD  cholecalciferol (VITAMIN D) 1000 UNITS tablet Take  1,000 Units by mouth daily.   Yes Historical Provider, MD  feeding supplement, GLUCERNA SHAKE, (GLUCERNA SHAKE) LIQD Take 237 mLs by mouth 3 (three) times daily between meals. 06/22/15  Yes Albertine Grates, MD  Insulin Glargine (TOUJEO SOLOSTAR) 300 UNIT/ML SOPN Inject 14 Units into the skin at bedtime.   Yes Historical Provider, MD  insulin lispro (HUMALOG) 100 UNIT/ML injection Inject 4-16 Units into the skin 3 (three) times daily as needed for high blood sugar (high blood sugar). Injects 8 units at breakfast, 4 units lunch, 8 to 10 units at dinner  Adjusted to blood glucose level. Patient adds an additional unit for every /dL   Yes Historical Provider, MD  saccharomyces boulardii (FLORASTOR) 250 MG capsule Take 1 capsule (250 mg total) by mouth 2 (two) times daily. 01/26/15  Yes Alison Murray, MD  simvastatin (ZOCOR) 40 MG tablet Take 40 mg by mouth daily.     Yes Historical Provider, MD  Tiotropium Bromide-Olodaterol (STIOLTO RESPIMAT) 2.5-2.5 MCG/ACT AERS Inhale 2 puffs into the lungs daily. Patient taking differently: Inhale 1 puff into the lungs daily as needed (for shortness of breath).  03/27/15  Yes Nyoka Cowden, MD  zolpidem (AMBIEN) 5 MG tablet Take 1 tablet (5 mg total) by mouth at bedtime as needed for sleep. 06/22/15  Yes Albertine Grates, MD   BP 154/85 mmHg  Pulse 60  Temp(Src) 98.9 F (37.2 C) (Oral)  Resp 18  SpO2 100% Physical Exam  Constitutional: He is oriented to person, place, and time. He appears well-developed and well-nourished.  HENT:  Head: Normocephalic and atraumatic.  Eyes: EOM are normal. Pupils are equal, round, and reactive to light.  Neck: Normal range of motion. Neck supple. No JVD present.  Cardiovascular: Normal rate and regular rhythm.  Exam reveals no gallop and no friction rub.   No murmur heard. Pulmonary/Chest: No respiratory distress. He has no wheezes. He has no rales.  Abdominal: He exhibits no distension. There is no tenderness. There is no rebound and no  guarding.  Musculoskeletal: Normal range of motion.  Neurological: He is alert and oriented to person, place, and time.  Skin: No rash noted. No pallor.  Psychiatric: He has a normal mood and affect. His behavior is normal.  Nursing note and vitals reviewed.   ED Course  Procedures (including critical care time) Labs Review Labs Reviewed  CBG MONITORING, ED  CBG MONITORING, ED  CBG MONITORING, ED    Imaging Review No results found. I have personally reviewed and evaluated these images and lab results as part of my medical decision-making.   EKG Interpretation None      MDM   Final diagnoses:  Hypoglycemia    82 yo M with a chief complaint of hypoglycemia. Blood sugar 84 on arrival here. His insulin about  9 AM. Is about 5 hours prior weight as the half-life of Humalog. Will recheck blood sugar and discharge home.  Repeat blood sugar normal. Discharge home.    I have discussed the diagnosis/risks/treatment options with the patient and believe the pt to be eligible for discharge home to follow-up with PCP. We also discussed returning to the ED immediately if new or worsening sx occur. We discussed the sx which are most concerning (e.g., sudden worsening, hypoglycemia) that necessitate immediate return. Medications administered to the patient during their visit and any new prescriptions provided to the patient are listed below.  Medications given during this visit Medications - No data to display  Discharge Medication List as of 08/06/2015  5:13 PM      The patient appears reasonably screen and/or stabilized for discharge and I doubt any other medical condition or other [facility] requiring further screening, evaluation, or treatment in the ED at this time prior to discharge.    Melene Plan, DO 08/06/15 [place]

## 2015-09-17 ENCOUNTER — Ambulatory Visit
Admission: RE | Admit: 2015-09-17 | Discharge: 2015-09-17 | Disposition: A | Discharge status: Home / Self Care | Payer: Medicare Other | Source: Ambulatory Visit | Attending: Internal Medicine | Admitting: Internal Medicine

## 2015-09-17 ENCOUNTER — Other Ambulatory Visit: Payer: Self-pay | Admitting: Internal Medicine

## 2015-09-17 DIAGNOSIS — R0781 Pleurodynia: Secondary | ICD-10-CM

## 2015-09-17 DIAGNOSIS — W19XXXA Unspecified fall, initial encounter: Secondary | ICD-10-CM

## 2015-11-06 ENCOUNTER — Observation Stay (HOSPITAL_COMMUNITY)
Admission: EM | Admit: 2015-11-06 | Discharge: 2015-11-09 | Disposition: A | Discharge status: Skilled Nursing Facility | Payer: Medicare Other | Attending: Internal Medicine | Admitting: Internal Medicine

## 2015-11-06 ENCOUNTER — Encounter (HOSPITAL_COMMUNITY): Payer: Self-pay | Admitting: Emergency Medicine

## 2015-11-06 DIAGNOSIS — Z885 Allergy status to narcotic agent status: Secondary | ICD-10-CM | POA: Insufficient documentation

## 2015-11-06 DIAGNOSIS — R9389 Abnormal findings on diagnostic imaging of other specified body structures: Secondary | ICD-10-CM

## 2015-11-06 DIAGNOSIS — R531 Weakness: Secondary | ICD-10-CM | POA: Diagnosis not present

## 2015-11-06 DIAGNOSIS — Z87891 Personal history of nicotine dependence: Secondary | ICD-10-CM | POA: Diagnosis not present

## 2015-11-06 DIAGNOSIS — J449 Chronic obstructive pulmonary disease, unspecified: Secondary | ICD-10-CM | POA: Insufficient documentation

## 2015-11-06 DIAGNOSIS — I700 Atherosclerosis of aorta: Secondary | ICD-10-CM | POA: Diagnosis not present

## 2015-11-06 DIAGNOSIS — I5040 Unspecified combined systolic (congestive) and diastolic (congestive) heart failure: Secondary | ICD-10-CM | POA: Insufficient documentation

## 2015-11-06 DIAGNOSIS — I130 Hypertensive heart and chronic kidney disease with heart failure and stage 1 through stage 4 chronic kidney disease, or unspecified chronic kidney disease: Secondary | ICD-10-CM | POA: Diagnosis not present

## 2015-11-06 DIAGNOSIS — N390 Urinary tract infection, site not specified: Principal | ICD-10-CM | POA: Diagnosis present

## 2015-11-06 DIAGNOSIS — E1165 Type 2 diabetes mellitus with hyperglycemia: Secondary | ICD-10-CM | POA: Diagnosis not present

## 2015-11-06 DIAGNOSIS — N319 Neuromuscular dysfunction of bladder, unspecified: Secondary | ICD-10-CM | POA: Insufficient documentation

## 2015-11-06 DIAGNOSIS — E291 Testicular hypofunction: Secondary | ICD-10-CM | POA: Insufficient documentation

## 2015-11-06 DIAGNOSIS — Z794 Long term (current) use of insulin: Secondary | ICD-10-CM | POA: Insufficient documentation

## 2015-11-06 DIAGNOSIS — Z881 Allergy status to other antibiotic agents status: Secondary | ICD-10-CM | POA: Diagnosis not present

## 2015-11-06 DIAGNOSIS — J45909 Unspecified asthma, uncomplicated: Secondary | ICD-10-CM | POA: Diagnosis not present

## 2015-11-06 DIAGNOSIS — R739 Hyperglycemia, unspecified: Secondary | ICD-10-CM

## 2015-11-06 DIAGNOSIS — B1920 Unspecified viral hepatitis C without hepatic coma: Secondary | ICD-10-CM | POA: Diagnosis not present

## 2015-11-06 DIAGNOSIS — N179 Acute kidney failure, unspecified: Secondary | ICD-10-CM | POA: Diagnosis not present

## 2015-11-06 DIAGNOSIS — Z9181 History of falling: Secondary | ICD-10-CM | POA: Insufficient documentation

## 2015-11-06 DIAGNOSIS — N184 Chronic kidney disease, stage 4 (severe): Secondary | ICD-10-CM | POA: Diagnosis present

## 2015-11-06 DIAGNOSIS — J189 Pneumonia, unspecified organism: Secondary | ICD-10-CM

## 2015-11-06 DIAGNOSIS — M810 Age-related osteoporosis without current pathological fracture: Secondary | ICD-10-CM | POA: Diagnosis not present

## 2015-11-06 DIAGNOSIS — Z87440 Personal history of urinary (tract) infections: Secondary | ICD-10-CM | POA: Insufficient documentation

## 2015-11-06 DIAGNOSIS — R011 Cardiac murmur, unspecified: Secondary | ICD-10-CM | POA: Diagnosis not present

## 2015-11-06 DIAGNOSIS — E1122 Type 2 diabetes mellitus with diabetic chronic kidney disease: Secondary | ICD-10-CM | POA: Diagnosis not present

## 2015-11-06 LAB — URINE MICROSCOPIC-ADD ON

## 2015-11-06 LAB — URINALYSIS, ROUTINE W REFLEX MICROSCOPIC
Bilirubin Urine: NEGATIVE
Glucose, UA: >1000 mg/dL — AB
KETONES UR: NEGATIVE mg/dL
NITRITE: NEGATIVE
PROTEIN: 30 mg/dL — AB
Specific Gravity, Urine: 1.015 (ref 1.005–1.030)
pH: 6.5 (ref 5.0–8.0)

## 2015-11-06 LAB — CBG MONITORING, ED: GLUCOSE-CAPILLARY: 376 mg/dL — AB (ref 65–99)

## 2015-11-06 MED ORDER — SODIUM CHLORIDE 0.9 % IV BOLUS (SEPSIS)
500.0000 mL | Freq: Once | INTRAVENOUS | Status: AC
  Administered 2015-11-07: 500 mL via INTRAVENOUS

## 2015-11-06 NOTE — ED Notes (Signed)
Pt presents to ED via EMS for hyperglycemia that read high/above 600 per EMS. Pt reports that took 20units about and hour ago but no change. Pt also reports that he is been feeling weak for few weeks and had frequent falls. Laceration noted on left elbow. Pt alerts and oriented x4 at this time.

## 2015-11-06 NOTE — ED Provider Notes (Signed)
CSN: [id number]     Arrival date & time 11/06/15  2315 History  By signing my name below, I, Marisue Humble, attest that this documentation has been prepared under the direction and in the presence of Jaryd Drew, MD . Electronically Signed: Marisue Humble, Scribe. 11/06/2015. 12:25 AM.   Chief Complaint  Patient presents with  . Hyperglycemia   Patient is a 82 y.o. male presenting with hyperglycemia. The history is provided by the patient. No language interpreter was used.  Hyperglycemia Blood sugar level PTA:  High Severity:  Severe Onset quality:  Gradual Duration:  1 day Timing:  Constant Progression:  Unchanged Chronicity:  New Diabetes status:  Controlled with insulin Context: not change in medication   Relieved by:  Nothing Ineffective treatments:  None tried Associated symptoms: no abdominal pain, no fever and no vomiting   Risk factors: no obesity    HPI Comments:  Benjamin Riddle is a 82 y.o. male with PMHx of DM, CKD, HTN, HLD and COPD who presents to the Emergency Department via EMS complaining of chronic hyperglycemia reading over 600 ~6-7 hours ago. Pt took 20 units of Humalog without relief. Pt reports associated frequency today and chronic diarrhea for the past few months since starting cephelex. Pt states he ate rice crispies for breakfast and has had nothing else to eat since. Pt denies recent diet change, recent steroid use, cough, congestion, or vomiting.   Past Medical History  Diagnosis Date  . Diabetes mellitus   . Asthma   . Hydronephrosis   . Kidney disease     Stage III chronic kidney disease  . Hypertension   . Neurogenic bladder   . Osteoporosis   . Hypogonadism male   . Recurrent urinary tract infection     With resistent Pseudomonas  . Allergic rhinitis   . Hepatitis C     due to blood transfusion  . Hyperlipidemia   . SOB (shortness of breath)   . CKD (chronic kidney disease), stage III   . Combined systolic and diastolic congestive  heart failure (HCC)   . COPD (chronic obstructive pulmonary disease) (HCC)   . Allergy   . Cataract   . Heart murmur    Past Surgical History  Procedure Laterality Date  . Hernia repair    . Tonsillectomy    . Bladder repair      obstruction surgery  . Frontal sinus obliteration      sinus repaired with a plate  . Cataract extraction  2015  . Eye surgery    . Fracture surgery    . Prostate surgery    . Spine surgery     Family History  Problem Relation Age of Onset  . Heart failure Mother   . Hypertension Mother   . COPD Father     smoked  . Diabetes Father   . Cancer Sister     Breast  . Diabetes Sister   . COPD Brother     smoked   Social History  Substance Use Topics  . Smoking status: Former Smoker    Types: Cigars, Cigarettes    Quit date: 07/21/2011  . Smokeless tobacco: None     Comment: smoked occ cigar and pipe  . Alcohol Use: No    Review of Systems  Constitutional: Negative for fever.  HENT: Negative for congestion.   Respiratory: Negative for cough.   Cardiovascular:       Hyperglycemia  Gastrointestinal: Positive for diarrhea. Negative for vomiting and abdominal  pain.  Genitourinary: Positive for frequency. Negative for flank pain.  All other systems reviewed and are negative.  Allergies  Codeine; Gentamicin; and Ciprofloxacin  Home Medications   Prior to Admission medications   Medication Sig Start Date End Date Taking? Authorizing Provider  albuterol (2.5 MG/3ML) 0.083% NEBU 3 mL, albuterol (5 MG/ML) 0.5% NEBU 0.5 mL Inhale 5 mg into the lungs every 6 (six) hours as needed (wheezing).   Yes Historical Provider, MD  albuterol (PROVENTIL,VENTOLIN) 90 MCG/ACT inhaler Inhale 2 puffs into the lungs every 4 (four) hours as needed for wheezing or shortness of breath.    Yes Historical Provider, MD  bismuth subsalicylate (PEPTO BISMOL) 262 MG/15ML suspension Take 30 mLs by mouth every 6 (six) hours as needed for indigestion or diarrhea or loose  stools.   Yes Historical Provider, MD  cephALEXin (KEFLEX) 250 MG capsule Take 250 mg by mouth daily.   Yes Historical Provider, MD  cholecalciferol (VITAMIN D) 1000 UNITS tablet Take 1,000 Units by mouth daily.   Yes Historical Provider, MD  Insulin Glargine (TOUJEO SOLOSTAR) 300 UNIT/ML SOPN Inject 10-14 Units into the skin at bedtime.    Yes Historical Provider, MD  insulin lispro (HUMALOG) 100 UNIT/ML injection Inject 4-16 Units into the skin 3 (three) times daily as needed for high blood sugar (high blood sugar). Injects 8 units at breakfast, 4 units lunch, 8 to 10 units at dinner  Adjusted to blood glucose level. Patient adds an additional unit for every /dL   Yes Historical Provider, MD  mometasone-formoterol (DULERA) 100-5 MCG/ACT AERO Inhale 2 puffs into the lungs 2 (two) times daily.   Yes Historical Provider, MD  saccharomyces boulardii (FLORASTOR) 250 MG capsule Take 1 capsule (250 mg total) by mouth 2 (two) times daily. 01/26/15  Yes Alison Murray, MD  simvastatin (ZOCOR) 40 MG tablet Take 40 mg by mouth daily.     Yes Historical Provider, MD  zolpidem (AMBIEN) 5 MG tablet Take 1 tablet (5 mg total) by mouth at bedtime as needed for sleep. 06/22/15  Yes Albertine Grates, MD  feeding supplement, GLUCERNA SHAKE, (GLUCERNA SHAKE) LIQD Take 237 mLs by mouth 3 (three) times daily between meals. Patient not taking: Reported on 11/06/2015 06/22/15   Albertine Grates, MD  Tiotropium Bromide-Olodaterol (STIOLTO RESPIMAT) 2.5-2.5 MCG/ACT AERS Inhale 2 puffs into the lungs daily. Patient not taking: Reported on 11/06/2015 03/27/15   Nyoka Cowden, MD   BP 148/85 mmHg  Pulse 74  Temp(Src) 98.5 F (36.9 C) (Oral)  Resp 19  Ht  (1.753 m)  Wt 134 lb (60.782 kg)  BMI 19.78 kg/m2  SpO2 97% Physical Exam  Constitutional: He appears well-developed and well-nourished.  HENT:  Head: Normocephalic and atraumatic.  Mouth/Throat: Oropharynx is clear and moist. No oropharyngeal exudate.  no smell of ketones  Eyes:  Pupils are equal, round, and reactive to light.  Neck: Neck supple. Carotid bruit is not present. No tracheal deviation present.  Trachea midline  Cardiovascular: Normal rate, regular rhythm, normal heart sounds and intact distal pulses.   Pulses:      Dorsalis pedis pulses are 2+ on the right side, and 2+ on the left side.  Pulmonary/Chest: Effort normal. No stridor. He has decreased breath sounds. He has no wheezes. He has no rhonchi. He has no rales.  Diminished breath sounds  Abdominal: Soft. Bowel sounds are normal. There is no rebound and no guarding.  Musculoskeletal: Normal range of motion. He exhibits no edema or tenderness.  Neurological: He is alert. He has normal reflexes.  Skin: Skin is warm and dry. No lesion noted. He is not diaphoretic.  Nursing note and vitals reviewed.  ED Course  Procedures  DIAGNOSTIC STUDIES:  Oxygen Saturation is 97% on RA, normal by my interpretation.    COORDINATION OF CARE:  11:28 PM Will administer rocephin and fluids. Will order chest x-ray and CMP. Discussed treatment plan with pt at bedside and pt agreed to plan.  Labs Review Labs Reviewed  CBC WITH DIFFERENTIAL/PLATELET - Abnormal; Notable for the following:    HCT 38.2 (*)    Monocytes Absolute 1.2 (*)    All other components within normal limits  URINALYSIS, ROUTINE W REFLEX MICROSCOPIC (NOT AT [facility]) - Abnormal; Notable for the following:    APPearance TURBID (*)    Glucose, UA >1000 (*)    Hgb urine dipstick SMALL (*)    Protein, ur 30 (*)    Leukocytes, UA LARGE (*)    All other components within normal limits  COMPREHENSIVE METABOLIC PANEL - Abnormal; Notable for the following:    Glucose, Bld 423 (*)    BUN 57 (*)    Creatinine, Ser 2.45 (*)    GFR calc non Af Amer 22 (*)    GFR calc Af Amer 26 (*)    All other components within normal limits  URINE MICROSCOPIC-ADD ON - Abnormal; Notable for the following:    Squamous Epithelial / LPF 0-5 (*)    Bacteria, UA FEW (*)     All other components within normal limits  I-STAT CHEM 8, ED - Abnormal; Notable for the following:    BUN 56 (*)    Creatinine, Ser 2.20 (*)    Glucose, Bld 406 (*)    All other components within normal limits  CBG MONITORING, ED - Abnormal; Notable for the following:    Glucose-Capillary 376 (*)    All other components within normal limits  I-STAT TROPOININ, ED    Imaging Review Dg Chest 2 View  11/07/2015  CLINICAL DATA:  Elevated blood sugar. Weakness and shortness of breath. EXAM: CHEST  2 VIEW COMPARISON:  09/17/2015 FINDINGS: Mild emphysematous changes in the lungs. Central interstitial pattern with bronchial thickening suggesting bronchitis. Streaky infiltrates in the right upper lung may represent early bronchopneumonia. No blunting of costophrenic angles. No pneumothorax. Mediastinal contours appear intact. Normal heart size and pulmonary vascularity. Degenerative changes in the spine. Calcification of the aorta. IMPRESSION: Emphysematous changes with bronchial wall thickening an perihilar interstitial changes suggesting acute or chronic bronchitis. Possible early infiltration in the right upper lung. Electronically Signed   By: Burman NievesWilliam  Stevens M.D.   On: 11/07/2015 01:03   I have personally reviewed and evaluated these images and lab results as part of my medical decision-making.   EKG Interpretation None      MDM   Final diagnoses:  None   Results for orders placed or performed during the hospital encounter of 11/06/15  CBC with Differential/Platelet  Result Value Ref Range   WBC 10.0 4.0 - 10.5 K/uL   RBC 4.26 4.22 - 5.81 MIL/uL   Hemoglobin 13.3 13.0 - 17.0 g/dL   HCT 16.138.2 (L) 09.639.0 - 04.552.0 %   MCV 89.7 78.0 - 100.0 fL   MCH 31.2 26.0 - 34.0 pg   MCHC 34.8 30.0 - 36.0 g/dL   RDW 40.913.4 81.111.5 - 91.415.5 %   Platelets 327 150 - 400 K/uL   Neutrophils Relative % 71 %  Neutro Abs 7.3 1.7 - 7.7 K/uL   Lymphocytes Relative 12 %   Lymphs Abs 1.2 0.7 - 4.0 K/uL    Monocytes Relative 12 %   Monocytes Absolute 1.2 (H) 0.1 - 1.0 K/uL   Eosinophils Relative 4 %   Eosinophils Absolute 0.4 0.0 - 0.7 K/uL   Basophils Relative 1 %   Basophils Absolute 0.1 0.0 - 0.1 K/uL  Urinalysis, Routine w reflex microscopic (not at [facility])  Result Value Ref Range   Color, Urine YELLOW YELLOW   APPearance TURBID (A) CLEAR   Specific Gravity, Urine 1.015 1.005 - 1.030   pH 6.5 5.0 - 8.0   Glucose, UA >1000 (A) NEGATIVE mg/dL   Hgb urine dipstick SMALL (A) NEGATIVE   Bilirubin Urine NEGATIVE NEGATIVE   Ketones, ur NEGATIVE NEGATIVE mg/dL   Protein, ur 30 (A) NEGATIVE mg/dL   Nitrite NEGATIVE NEGATIVE   Leukocytes, UA LARGE (A) NEGATIVE  Comprehensive metabolic panel  Result Value Ref Range   Sodium 137 135 - 145 mmol/L   Potassium 4.7 3.5 - 5.1 mmol/L   Chloride 106 101 - 111 mmol/L   CO2 23 22 - 32 mmol/L   Glucose, Bld 423 (H) 65 - 99 mg/dL   BUN 57 (H) 6 - 20 mg/dL   Creatinine, Ser 1.61 (H) 0.61 - 1.24 mg/dL   Calcium 9.0 8.9 - 09.6 mg/dL   Total Protein 7.5 6.5 - 8.1 g/dL   Albumin 4.0 3.5 - 5.0 g/dL   AST 29 15 - 41 U/L   ALT 18 17 - 63 U/L   Alkaline Phosphatase 69 38 - 126 U/L   Total Bilirubin 0.5 0.3 - 1.2 mg/dL   GFR calc non Af Amer 22 (L) >60 mL/min   GFR calc Af Amer 26 (L) >60 mL/min   Anion gap 8 5 - 15  Urine microscopic-add on  Result Value Ref Range   Squamous Epithelial / LPF 0-5 (A) NONE SEEN   WBC, UA TOO NUMEROUS TO COUNT 0 - 5 WBC/hpf   RBC / HPF 0-5 0 - 5 RBC/hpf   Bacteria, UA FEW (A) NONE SEEN   Urine-Other YEAST PRESENT   I-Stat Chem 8, ED  Result Value Ref Range   Sodium 139 135 - 145 mmol/L   Potassium 4.7 3.5 - 5.1 mmol/L   Chloride 103 101 - 111 mmol/L   BUN 56 (H) 6 - 20 mg/dL   Creatinine, Ser 0.45 (H) 0.61 - 1.24 mg/dL   Glucose, Bld 409 (H) 65 - 99 mg/dL   Calcium, Ion 8.11 9.14 - 1.30 mmol/L   TCO2 22 0 - 100 mmol/L   Hemoglobin 15.0 13.0 - 17.0 g/dL   HCT 78.2 95.6 - 21.3 %  POC CBG, ED  Result Value Ref  Range   Glucose-Capillary 376 (H) 65 - 99 mg/dL  I-stat troponin, ED  Result Value Ref Range   Troponin i, poc 0.01 0.00 - 0.08 ng/mL   Comment 3           Dg Chest 2 View  11/07/2015  CLINICAL DATA:  Elevated blood sugar. Weakness and shortness of breath. EXAM: CHEST  2 VIEW COMPARISON:  09/17/2015 FINDINGS: Mild emphysematous changes in the lungs. Central interstitial pattern with bronchial thickening suggesting bronchitis. Streaky infiltrates in the right upper lung may represent early bronchopneumonia. No blunting of costophrenic angles. No pneumothorax. Mediastinal contours appear intact. Normal heart size and pulmonary vascularity. Degenerative changes in the spine. Calcification of the aorta. IMPRESSION:  Emphysematous changes with bronchial wall thickening an perihilar interstitial changes suggesting acute or chronic bronchitis. Possible early infiltration in the right upper lung. Electronically Signed   By: Burman Nieves M.D.   On: 11/07/2015 01:03    Filed Vitals:   11/07/15 0030 11/07/15 0100  BP: 159/94 148/85  Pulse: 79 74  Temp:    Resp: 18 19    Results for orders placed or performed during the hospital encounter of 11/06/15  CBC with Differential/Platelet  Result Value Ref Range   WBC 10.0 4.0 - 10.5 K/uL   RBC 4.26 4.22 - 5.81 MIL/uL   Hemoglobin 13.3 13.0 - 17.0 g/dL   HCT 16.1 (L) 09.6 - 04.5 %   MCV 89.7 78.0 - 100.0 fL   MCH 31.2 26.0 - 34.0 pg   MCHC 34.8 30.0 - 36.0 g/dL   RDW 40.9 81.1 - 91.4 %   Platelets 327 150 - 400 K/uL   Neutrophils Relative % 71 %   Neutro Abs 7.3 1.7 - 7.7 K/uL   Lymphocytes Relative 12 %   Lymphs Abs 1.2 0.7 - 4.0 K/uL   Monocytes Relative 12 %   Monocytes Absolute 1.2 (H) 0.1 - 1.0 K/uL   Eosinophils Relative 4 %   Eosinophils Absolute 0.4 0.0 - 0.7 K/uL   Basophils Relative 1 %   Basophils Absolute 0.1 0.0 - 0.1 K/uL  Urinalysis, Routine w reflex microscopic (not at [facility])  Result Value Ref Range   Color, Urine YELLOW  YELLOW   APPearance TURBID (A) CLEAR   Specific Gravity, Urine 1.015 1.005 - 1.030   pH 6.5 5.0 - 8.0   Glucose, UA >1000 (A) NEGATIVE mg/dL   Hgb urine dipstick SMALL (A) NEGATIVE   Bilirubin Urine NEGATIVE NEGATIVE   Ketones, ur NEGATIVE NEGATIVE mg/dL   Protein, ur 30 (A) NEGATIVE mg/dL   Nitrite NEGATIVE NEGATIVE   Leukocytes, UA LARGE (A) NEGATIVE  Comprehensive metabolic panel  Result Value Ref Range   Sodium 137 135 - 145 mmol/L   Potassium 4.7 3.5 - 5.1 mmol/L   Chloride 106 101 - 111 mmol/L   CO2 23 22 - 32 mmol/L   Glucose, Bld 423 (H) 65 - 99 mg/dL   BUN 57 (H) 6 - 20 mg/dL   Creatinine, Ser 7.82 (H) 0.61 - 1.24 mg/dL   Calcium 9.0 8.9 - 95.6 mg/dL   Total Protein 7.5 6.5 - 8.1 g/dL   Albumin 4.0 3.5 - 5.0 g/dL   AST 29 15 - 41 U/L   ALT 18 17 - 63 U/L   Alkaline Phosphatase 69 38 - 126 U/L   Total Bilirubin 0.5 0.3 - 1.2 mg/dL   GFR calc non Af Amer 22 (L) >60 mL/min   GFR calc Af Amer 26 (L) >60 mL/min   Anion gap 8 5 - 15  Urine microscopic-add on  Result Value Ref Range   Squamous Epithelial / LPF 0-5 (A) NONE SEEN   WBC, UA TOO NUMEROUS TO COUNT 0 - 5 WBC/hpf   RBC / HPF 0-5 0 - 5 RBC/hpf   Bacteria, UA FEW (A) NONE SEEN   Urine-Other YEAST PRESENT   I-Stat Chem 8, ED  Result Value Ref Range   Sodium 139 135 - 145 mmol/L   Potassium 4.7 3.5 - 5.1 mmol/L   Chloride 103 101 - 111 mmol/L   BUN 56 (H) 6 - 20 mg/dL   Creatinine, Ser 2.13 (H) 0.61 - 1.24 mg/dL   Glucose, Bld  406 (H) 65 - 99 mg/dL   Calcium, Ion 4.09 8.11 - 1.30 mmol/L   TCO2 22 0 - 100 mmol/L   Hemoglobin 15.0 13.0 - 17.0 g/dL   HCT 91.4 78.2 - 95.6 %  POC CBG, ED  Result Value Ref Range   Glucose-Capillary 376 (H) 65 - 99 mg/dL  I-stat troponin, ED  Result Value Ref Range   Troponin i, poc 0.01 0.00 - 0.08 ng/mL   Comment 3           Dg Chest 2 View  11/07/2015  CLINICAL DATA:  Elevated blood sugar. Weakness and shortness of breath. EXAM: CHEST  2 VIEW COMPARISON:  09/17/2015  FINDINGS: Mild emphysematous changes in the lungs. Central interstitial pattern with bronchial thickening suggesting bronchitis. Streaky infiltrates in the right upper lung may represent early bronchopneumonia. No blunting of costophrenic angles. No pneumothorax. Mediastinal contours appear intact. Normal heart size and pulmonary vascularity. Degenerative changes in the spine. Calcification of the aorta. IMPRESSION: Emphysematous changes with bronchial wall thickening an perihilar interstitial changes suggesting acute or chronic bronchitis. Possible early infiltration in the right upper lung. Electronically Signed   By: Burman Nieves M.D.   On: 11/07/2015 01:03    Medications  cefTRIAXone (ROCEPHIN) 1 g in dextrose 5 % 50 mL IVPB (1 g Intravenous New Bag/Given 11/07/15 0125)  0.9 %  sodium chloride infusion ( Intravenous New Bag/Given 11/07/15 0123)  fluconazole (DIFLUCAN) IVPB 200 mg (not administered)  dextrose 5 %-0.45 % sodium chloride infusion (not administered)  insulin regular bolus via infusion 0-10 Units (not administered)  insulin regular (NOVOLIN R,HUMULIN R) 250 Units in sodium chloride 0.9 % 250 mL (1 Units/mL) infusion (not administered)  dextrose 50 % solution 25 mL (not administered)  0.9 %  sodium chloride infusion (not administered)  albuterol (PROVENTIL) 3.5 mL aerosol nebs (not administered)  albuterol (PROVENTIL,VENTOLIN) inhaler 2 puff (not administered)  bismuth subsalicylate (PEPTO BISMOL) 262 MG/15ML suspension 30 mL (not administered)  saccharomyces boulardii (FLORASTOR) capsule 250 mg (not administered)  mometasone-formoterol (DULERA) 100-5 MCG/ACT inhaler 2 puff (not administered)  simvastatin (ZOCOR) tablet 40 mg (not administered)  zolpidem (AMBIEN) tablet 5 mg (not administered)  cefTRIAXone (ROCEPHIN) 1 g in dextrose 5 % 50 mL IVPB (not administered)  heparin injection 5,000 Units (not administered)  sodium chloride 0.9 % bolus 500 mL (500 mLs Intravenous New  Bag/Given 11/07/15 0123)  insulin aspart (novoLOG) injection 5 Units (5 Units Subcutaneous Given 11/07/15 0124)   Will admit to inpatient for failure of outpatient management of UTI  I personally performed the services described in this documentation, which was scribed in my presence. The recorded information has been reviewed and is accurate.      Cy Blamer, MD 11/07/15 [phone]

## 2015-11-06 NOTE — ED Notes (Signed)
Bed: WA03 Expected date:  Expected time:  Means of arrival:  Comments: EMS 82 yo male hyperglycemia

## 2015-11-07 ENCOUNTER — Emergency Department (HOSPITAL_COMMUNITY): Payer: Medicare Other

## 2015-11-07 ENCOUNTER — Inpatient Hospital Stay (HOSPITAL_COMMUNITY): Payer: Medicare Other

## 2015-11-07 ENCOUNTER — Encounter (HOSPITAL_COMMUNITY): Payer: Self-pay | Admitting: Emergency Medicine

## 2015-11-07 DIAGNOSIS — N184 Chronic kidney disease, stage 4 (severe): Secondary | ICD-10-CM | POA: Diagnosis not present

## 2015-11-07 DIAGNOSIS — E1122 Type 2 diabetes mellitus with diabetic chronic kidney disease: Secondary | ICD-10-CM | POA: Diagnosis not present

## 2015-11-07 DIAGNOSIS — N390 Urinary tract infection, site not specified: Secondary | ICD-10-CM

## 2015-11-07 DIAGNOSIS — N179 Acute kidney failure, unspecified: Secondary | ICD-10-CM | POA: Diagnosis not present

## 2015-11-07 LAB — CBC WITH DIFFERENTIAL/PLATELET
BASOS ABS: 0.1 10*3/uL (ref 0.0–0.1)
Basophils Relative: 1 %
EOS ABS: 0.4 10*3/uL (ref 0.0–0.7)
EOS PCT: 4 %
HCT: 38.2 % — ABNORMAL LOW (ref 39.0–52.0)
Hemoglobin: 13.3 g/dL (ref 13.0–17.0)
LYMPHS PCT: 12 %
Lymphs Abs: 1.2 10*3/uL (ref 0.7–4.0)
MCH: 31.2 pg (ref 26.0–34.0)
MCHC: 34.8 g/dL (ref 30.0–36.0)
MCV: 89.7 fL (ref 78.0–100.0)
MONO ABS: 1.2 10*3/uL — AB (ref 0.1–1.0)
Monocytes Relative: 12 %
Neutro Abs: 7.3 10*3/uL (ref 1.7–7.7)
Neutrophils Relative %: 71 %
PLATELETS: 327 10*3/uL (ref 150–400)
RBC: 4.26 MIL/uL (ref 4.22–5.81)
RDW: 13.4 % (ref 11.5–15.5)
WBC: 10 10*3/uL (ref 4.0–10.5)

## 2015-11-07 LAB — GLUCOSE, CAPILLARY
Glucose-Capillary: 159 mg/dL — ABNORMAL HIGH (ref 65–99)
Glucose-Capillary: 110 mg/dL — ABNORMAL HIGH (ref 65–99)
Glucose-Capillary: 377 mg/dL — ABNORMAL HIGH (ref 65–99)
Glucose-Capillary: 397 mg/dL — ABNORMAL HIGH (ref 65–99)
Glucose-Capillary: 450 mg/dL — ABNORMAL HIGH (ref 65–99)

## 2015-11-07 LAB — I-STAT CHEM 8, ED
BUN: 56 mg/dL — ABNORMAL HIGH (ref 6–20)
CALCIUM ION: 1.13 mmol/L (ref 1.13–1.30)
Chloride: 103 mmol/L (ref 101–111)
Creatinine, Ser: 2.2 mg/dL — ABNORMAL HIGH (ref 0.61–1.24)
GLUCOSE: 406 mg/dL — AB (ref 65–99)
HCT: 44 % (ref 39.0–52.0)
HEMOGLOBIN: 15 g/dL (ref 13.0–17.0)
POTASSIUM: 4.7 mmol/L (ref 3.5–5.1)
SODIUM: 139 mmol/L (ref 135–145)
TCO2: 22 mmol/L (ref 0–100)

## 2015-11-07 LAB — BASIC METABOLIC PANEL
ANION GAP: 12 (ref 5–15)
BUN: 53 mg/dL — ABNORMAL HIGH (ref 6–20)
CALCIUM: 9.2 mg/dL (ref 8.9–10.3)
CO2: 22 mmol/L (ref 22–32)
CREATININE: 2.21 mg/dL — AB (ref 0.61–1.24)
Chloride: 107 mmol/L (ref 101–111)
GFR, EST AFRICAN AMERICAN: 30 mL/min — AB (ref >60)
GFR, EST NON AFRICAN AMERICAN: 25 mL/min — AB (ref >60)
Glucose, Bld: 84 mg/dL (ref 65–99)
Potassium: 5.1 mmol/L (ref 3.5–5.1)
SODIUM: 141 mmol/L (ref 135–145)

## 2015-11-07 LAB — CBG MONITORING, ED: GLUCOSE-CAPILLARY: 205 mg/dL — AB (ref 65–99)

## 2015-11-07 LAB — COMPREHENSIVE METABOLIC PANEL
ALT: 18 U/L (ref 17–63)
ANION GAP: 8 (ref 5–15)
AST: 29 U/L (ref 15–41)
Albumin: 4 g/dL (ref 3.5–5.0)
Alkaline Phosphatase: 69 U/L (ref 38–126)
BUN: 57 mg/dL — ABNORMAL HIGH (ref 6–20)
CHLORIDE: 106 mmol/L (ref 101–111)
CO2: 23 mmol/L (ref 22–32)
Calcium: 9 mg/dL (ref 8.9–10.3)
Creatinine, Ser: 2.45 mg/dL — ABNORMAL HIGH (ref 0.61–1.24)
GFR calc non Af Amer: 22 mL/min — ABNORMAL LOW (ref >60)
GFR, EST AFRICAN AMERICAN: 26 mL/min — AB (ref >60)
Glucose, Bld: 423 mg/dL — ABNORMAL HIGH (ref 65–99)
Potassium: 4.7 mmol/L (ref 3.5–5.1)
SODIUM: 137 mmol/L (ref 135–145)
Total Bilirubin: 0.5 mg/dL (ref 0.3–1.2)
Total Protein: 7.5 g/dL (ref 6.5–8.1)

## 2015-11-07 LAB — CBC
HEMATOCRIT: 41.3 % (ref 39.0–52.0)
HEMOGLOBIN: 13.8 g/dL (ref 13.0–17.0)
MCH: 30.5 pg (ref 26.0–34.0)
MCHC: 33.4 g/dL (ref 30.0–36.0)
MCV: 91.4 fL (ref 78.0–100.0)
Platelets: 348 10*3/uL (ref 150–400)
RBC: 4.52 MIL/uL (ref 4.22–5.81)
RDW: 13.7 % (ref 11.5–15.5)
WBC: 9.8 10*3/uL (ref 4.0–10.5)

## 2015-11-07 LAB — I-STAT TROPONIN, ED: Troponin i, poc: 0.01 ng/mL (ref 0.00–0.08)

## 2015-11-07 MED ORDER — FLUCONAZOLE IN SODIUM CHLORIDE 200-0.9 MG/100ML-% IV SOLN
200.0000 mg | Freq: Once | INTRAVENOUS | Status: AC
  Administered 2015-11-07: 200 mg via INTRAVENOUS
  Filled 2015-11-07: qty 100

## 2015-11-07 MED ORDER — INSULIN REGULAR BOLUS VIA INFUSION
0.0000 [IU] | Freq: Three times a day (TID) | INTRAVENOUS | Status: DC
  Filled 2015-11-07: qty 10

## 2015-11-07 MED ORDER — INSULIN ASPART 100 UNIT/ML ~~LOC~~ SOLN
0.0000 [IU] | Freq: Three times a day (TID) | SUBCUTANEOUS | Status: DC
  Administered 2015-11-07 (×2): 9 [IU] via SUBCUTANEOUS
  Administered 2015-11-08: 3 [IU] via SUBCUTANEOUS
  Administered 2015-11-08: 9 [IU] via SUBCUTANEOUS
  Administered 2015-11-09: 7 [IU] via SUBCUTANEOUS
  Administered 2015-11-09: 2 [IU] via SUBCUTANEOUS

## 2015-11-07 MED ORDER — SODIUM CHLORIDE 0.9 % IV SOLN
INTRAVENOUS | Status: DC
  Filled 2015-11-07: qty 2.5

## 2015-11-07 MED ORDER — ALBUTEROL SULFATE (5 MG/ML) 0.5% IN NEBU: 5.0000 mg | INHALATION_SOLUTION | Freq: Four times a day (QID) | RESPIRATORY_TRACT | Status: DC | PRN

## 2015-11-07 MED ORDER — SODIUM CHLORIDE 0.9 % IV SOLN: INTRAVENOUS | Status: DC

## 2015-11-07 MED ORDER — INSULIN GLARGINE 100 UNIT/ML ~~LOC~~ SOLN
12.0000 [IU] | Freq: Every day | SUBCUTANEOUS | Status: DC
  Filled 2015-11-07: qty 0.12

## 2015-11-07 MED ORDER — ZOLPIDEM TARTRATE 5 MG PO TABS: 5.0000 mg | ORAL_TABLET | Freq: Every evening | ORAL | Status: DC | PRN

## 2015-11-07 MED ORDER — SIMVASTATIN 40 MG PO TABS
40.0000 mg | ORAL_TABLET | Freq: Every day | ORAL | Status: DC
  Administered 2015-11-07 – 2015-11-08 (×2): 40 mg via ORAL
  Filled 2015-11-07 (×2): qty 1

## 2015-11-07 MED ORDER — MOMETASONE FURO-FORMOTEROL FUM 100-5 MCG/ACT IN AERO
2.0000 | INHALATION_SPRAY | Freq: Two times a day (BID) | RESPIRATORY_TRACT | Status: DC
  Administered 2015-11-07 – 2015-11-09 (×5): 2 via RESPIRATORY_TRACT
  Filled 2015-11-07: qty 8.8

## 2015-11-07 MED ORDER — DEXTROSE 5 % IV SOLN: 500.0000 mg | INTRAVENOUS | Status: DC

## 2015-11-07 MED ORDER — ENSURE ENLIVE PO LIQD
237.0000 mL | Freq: Two times a day (BID) | ORAL | Status: DC
  Administered 2015-11-07 – 2015-11-09 (×2): 237 mL via ORAL

## 2015-11-07 MED ORDER — INSULIN ASPART 100 UNIT/ML ~~LOC~~ SOLN
5.0000 [IU] | Freq: Once | SUBCUTANEOUS | Status: AC
  Administered 2015-11-07: 5 [IU] via SUBCUTANEOUS
  Filled 2015-11-07: qty 1

## 2015-11-07 MED ORDER — ALBUTEROL SULFATE (2.5 MG/3ML) 0.083% IN NEBU: 3.0000 mL | INHALATION_SOLUTION | RESPIRATORY_TRACT | Status: DC | PRN

## 2015-11-07 MED ORDER — HEPARIN SODIUM (PORCINE) 5000 UNIT/ML IJ SOLN
5000.0000 [IU] | Freq: Three times a day (TID) | INTRAMUSCULAR | Status: DC
  Administered 2015-11-07 – 2015-11-09 (×6): 5000 [IU] via SUBCUTANEOUS
  Filled 2015-11-07 (×6): qty 1

## 2015-11-07 MED ORDER — DEXTROSE 5 % IV SOLN
1.0000 g | Freq: Once | INTRAVENOUS | Status: AC
  Administered 2015-11-07: 1 g via INTRAVENOUS
  Filled 2015-11-07: qty 10

## 2015-11-07 MED ORDER — INSULIN GLARGINE 100 UNIT/ML ~~LOC~~ SOLN
15.0000 [IU] | Freq: Every day | SUBCUTANEOUS | Status: DC
  Administered 2015-11-07 – 2015-11-08 (×2): 15 [IU] via SUBCUTANEOUS
  Filled 2015-11-07 (×2): qty 0.15

## 2015-11-07 MED ORDER — INSULIN GLARGINE 100 UNIT/ML ~~LOC~~ SOLN
8.0000 [IU] | Freq: Every day | SUBCUTANEOUS | Status: DC
  Filled 2015-11-07: qty 0.08

## 2015-11-07 MED ORDER — DEXTROSE 5 % IV SOLN
1.0000 g | INTRAVENOUS | Status: DC
Start: 2015-11-08 — End: 2015-11-09
  Administered 2015-11-08 – 2015-11-09 (×2): 1 g via INTRAVENOUS
  Filled 2015-11-07 (×3): qty 10

## 2015-11-07 MED ORDER — FLUCONAZOLE IN SODIUM CHLORIDE 100-0.9 MG/50ML-% IV SOLN
100.0000 mg | INTRAVENOUS | Status: DC
  Administered 2015-11-08 – 2015-11-09 (×2): 100 mg via INTRAVENOUS
  Filled 2015-11-07 (×2): qty 50

## 2015-11-07 MED ORDER — AZITHROMYCIN 500 MG IV SOLR: 500.0000 mg | Freq: Once | INTRAVENOUS | Status: DC

## 2015-11-07 MED ORDER — DEXTROSE 50 % IV SOLN: 25.0000 mL | INTRAVENOUS | Status: DC | PRN

## 2015-11-07 MED ORDER — SODIUM CHLORIDE 0.9 % IV SOLN
INTRAVENOUS | Status: DC
  Administered 2015-11-07 (×3): via INTRAVENOUS
  Administered 2015-11-08: 1000 mL via INTRAVENOUS
  Administered 2015-11-08 – 2015-11-09 (×2): via INTRAVENOUS

## 2015-11-07 MED ORDER — DEXTROSE-NACL 5-0.45 % IV SOLN: INTRAVENOUS | Status: DC

## 2015-11-07 MED ORDER — INSULIN ASPART 100 UNIT/ML ~~LOC~~ SOLN
10.0000 [IU] | Freq: Once | SUBCUTANEOUS | Status: AC
  Administered 2015-11-07: 10 [IU] via SUBCUTANEOUS

## 2015-11-07 MED ORDER — BISMUTH SUBSALICYLATE 262 MG/15ML PO SUSP: 30.0000 mL | Freq: Four times a day (QID) | ORAL | Status: DC | PRN

## 2015-11-07 MED ORDER — INSULIN ASPART 100 UNIT/ML ~~LOC~~ SOLN
0.0000 [IU] | Freq: Every day | SUBCUTANEOUS | Status: DC
  Administered 2015-11-08: 4 [IU] via SUBCUTANEOUS

## 2015-11-07 MED ORDER — SACCHAROMYCES BOULARDII 250 MG PO CAPS
250.0000 mg | ORAL_CAPSULE | Freq: Two times a day (BID) | ORAL | Status: DC
  Administered 2015-11-07 – 2015-11-09 (×6): 250 mg via ORAL
  Filled 2015-11-07 (×6): qty 1

## 2015-11-07 NOTE — Progress Notes (Signed)
Pt admitted from ED to 1332. A&Ox4, incont of bowel and bladder on admission. Condom cath placed due to frequency. Pt with orders to begin glucostabilizer; checked CBG and it was 159 (205 2 hrs prior). Paged on call NP and glucostabilizer d/c'd, orders for CBG ACHS and SSi placed. Continue to monitor. Mick SellShannon Alethia Melendrez RN

## 2015-11-07 NOTE — Progress Notes (Signed)
Pt seen and examined admitted earlier this am by Dr.Gardner, pls see H&P for details with, 87/M admitted w/ Hyperglycemia, UTI, h/o neurogenic bladder with intermittent self cath, AKi on CKD  1. DM/hyperglycemia -CBGs improved, continue lantus, SSI -likely triggered by infection  2. UTI, h/o same, self caths due to Neurogenic bladder, h/o bladder neck Sx -continue Ceftriaxone, FU Urine Cx  3. AKi on CKD4 -baseline creatinine around 1.7 in 12/16 -continue IVF today, hydrate, monitor  4. Possible Pneumonia on CXR -no symptoms, repeat CXR  5. COPD -stable , nebs PRN  Zannie CovePreetha Kamdon Reisig, MD

## 2015-11-07 NOTE — Progress Notes (Signed)
Pharmacy Antibiotic Note  Benjamin AranJerry Riddle is a 82 y.o. male admitted on 11/06/2015 with UTI.  Pharmacy has been consulted for fluconazole dosing.  Plan: Fluconazole 200mg  x1 then 100mg  IV q24h  Height: 5\' 9"  (175.3 cm) Weight: 134 lb (60.782 kg) IBW/kg (Calculated) : 70.7  Temp (24hrs), Avg:98.5 F (36.9 C), Min:98.5 F (36.9 C), Max:98.5 F (36.9 C)   Recent Labs Lab 11/06/15 2358 11/07/15 0032  WBC 10.0  --   CREATININE 2.45* 2.20*    Estimated Creatinine Clearance: 21.1 mL/min (by C-G formula based on Cr of 2.2).    Allergies  Allergen Reactions  . Codeine Nausea And Vomiting  . Gentamicin     Becomes dizzy and weak  . Ciprofloxacin Itching and Rash    Antimicrobials this admission: 4/20 fluconazole >>    >>   Dose adjustments this admission:   Microbiology results:  BCx:   UCx:    Sputum:    MRSA PCR:   Thank you for allowing pharmacy to be a part of this patient's care.  Lorenza EvangelistGreen, Meena Barrantes R 11/07/2015 12:45 AM

## 2015-11-07 NOTE — ED Notes (Signed)
CBG-205, start insulin drip regardless per Dr. Beacher MayGardener.

## 2015-11-07 NOTE — ED Notes (Signed)
Contact wife at Home phone: [phone] or cell: [phone] for updates or question per pt request.

## 2015-11-07 NOTE — Progress Notes (Signed)
Initial Nutrition Assessment  DOCUMENTATION CODES:   Severe malnutrition in context of chronic illness  INTERVENTION:  -Ensure Enlive po BID, each supplement provides 350 kcal and 20 grams of protein -RD continue to monitor  NUTRITION DIAGNOSIS:   Malnutrition related to chronic illness as evidenced by severe depletion of muscle mass, severe depletion of body fat.  GOAL:   Patient will meet greater than or equal to 90% of their needs  MONITOR:   PO intake, Labs, I & O's, Supplement acceptance, Skin  REASON FOR ASSESSMENT:   Malnutrition Screening Tool    ASSESSMENT:   Benjamin Riddle is a 82 y.o. male with h/o DM, CKD, recurrent UTIs. Patient presents to the ED with c/o hyperglycemia with BGL of over 600 6-7 hours ago. This is accompanied by generalized weakness.  Spoke with Mr. Benjamin Riddle at bedside; He reports severely poor appetite for approximately 1 week PTA. Prior to that, patient reports ok appetite, states "I just don't eat as much as I used to." Endorses weight loss during that time span but unsure of how much. Per chart pt was at 132# as of 06/2015, he is now 125# exhibiting a 7#/5% insignificant wt loss over 4 months.  Pt declines chewing/swallowing problems. Had some nausea PTA but has cleared up now.  Will provide Ensure during stay.  Nutrition-Focused physical exam completed. Findings are severe fat depletion, severe muscle depletion, and no edema.   Labs: CBG 110-377; Medications reviewed.   Diet Order:  Diet Carb Modified Fluid consistency:: Thin; Room service appropriate?: Yes  Skin:  Reviewed, no issues  Last BM:  4/20  Height:   Ht Readings from Last 1 Encounters:  11/07/15 5\' 9"  (1.753 m)    Weight:   Wt Readings from Last 1 Encounters:  11/07/15 125 lb 7.1 oz (56.9 kg)    Ideal Body Weight:  72.72 kg  BMI:  Body mass index is 18.52 kg/(m^2).  Estimated Nutritional Needs:   Kcal:  1700-1900  Protein:  70-80 grams  Fluid:  >/=  1.7L  EDUCATION NEEDS:   No education needs identified at this time  Dionne AnoWilliam M. Kelliann Pendergraph, MS, RD LDN After Hours/Weekend Pager [phone]

## 2015-11-07 NOTE — Progress Notes (Signed)
Nursing Note: Pt has h/o neurogenic bladder and does self caths @ home BID, pt on IVFs and no cath yet today.A; Pt in and out cathed for 1400 cc. Slow drain and pt tolerated well."Feels much better".wbb

## 2015-11-07 NOTE — H&P (Signed)
[facility] History and Physical  Benjamin Riddle ZOX:[id number] DOB: [birth year] DOA: 11/06/2015  Referring physician: EDP PCP: Ezequiel Kayser, MD   Chief Complaint: Hyperglycemia, generalized weakness   HPI: Benjamin Riddle is a 82 y.o. male with h/o DM, CKD, recurrent UTIs.  Patient presents to the ED with c/o hyperglycemia with BGL of over 600 6-7 hours ago.  This is accompanied by generalized weakness.  Despite being on chronic daily keflex to prevent UTI he also has had urinary frequency today.  This is severe enough that he did have a fall at home today.  There also intermittent diarrhea over the past few months since starting keflex.  He denies any SOB, cough, congestion, or other new respiratory symptoms.  Review of Systems: Systems reviewed.  As above, otherwise negative  Past Medical History  Diagnosis Date  . Diabetes mellitus   . Asthma   . Hydronephrosis   . Kidney disease     Stage III chronic kidney disease  . Hypertension   . Neurogenic bladder   . Osteoporosis   . Hypogonadism male   . Recurrent urinary tract infection     With resistent Pseudomonas  . Allergic rhinitis   . Hepatitis C     due to blood transfusion  . Hyperlipidemia   . SOB (shortness of breath)   . CKD (chronic kidney disease), stage III   . Combined systolic and diastolic congestive heart failure (HCC)   . COPD (chronic obstructive pulmonary disease) (HCC)   . Allergy   . Cataract   . Heart murmur    Past Surgical History  Procedure Laterality Date  . Hernia repair    . Tonsillectomy    . Bladder repair      obstruction surgery  . Frontal sinus obliteration      sinus repaired with a plate  . Cataract extraction  2015  . Eye surgery    . Fracture surgery    . Prostate surgery    . Spine surgery     Social History:  reports that he quit smoking about 4 years ago. His smoking use included Cigars and Cigarettes. He does not have any smokeless tobacco history on file. He  reports that he does not drink alcohol or use illicit drugs.  Allergies  Allergen Reactions  . Codeine Nausea And Vomiting  . Gentamicin     Becomes dizzy and weak  . Ciprofloxacin Itching and Rash    Family History  Problem Relation Age of Onset  . Heart failure Mother   . Hypertension Mother   . COPD Father     smoked  . Diabetes Father   . Cancer Sister     Breast  . Diabetes Sister   . COPD Brother     smoked     Prior to Admission medications   Medication Sig Start Date End Date Taking? Authorizing Provider  albuterol (2.5 MG/3ML) 0.083% NEBU 3 mL, albuterol (5 MG/ML) 0.5% NEBU 0.5 mL Inhale 5 mg into the lungs every 6 (six) hours as needed (wheezing).   Yes Historical Provider, MD  albuterol (PROVENTIL,VENTOLIN) 90 MCG/ACT inhaler Inhale 2 puffs into the lungs every 4 (four) hours as needed for wheezing or shortness of breath.    Yes Historical Provider, MD  bismuth subsalicylate (PEPTO BISMOL) 262 MG/15ML suspension Take 30 mLs by mouth every 6 (six) hours as needed for indigestion or diarrhea or loose stools.   Yes Historical Provider, MD  cholecalciferol (VITAMIN D) 1000 UNITS tablet Take  1,000 Units by mouth daily.   Yes Historical Provider, MD  Insulin Glargine (TOUJEO SOLOSTAR) 300 UNIT/ML SOPN Inject 10-14 Units into the skin at bedtime.    Yes Historical Provider, MD  insulin lispro (HUMALOG) 100 UNIT/ML injection Inject 4-16 Units into the skin 3 (three) times daily as needed for high blood sugar (high blood sugar). Injects 8 units at breakfast, 4 units lunch, 8 to 10 units at dinner  Adjusted to blood glucose level. Patient adds an additional unit for every /dL   Yes Historical Provider, MD  mometasone-formoterol (DULERA) 100-5 MCG/ACT AERO Inhale 2 puffs into the lungs 2 (two) times daily.   Yes Historical Provider, MD  saccharomyces boulardii (FLORASTOR) 250 MG capsule Take 1 capsule (250 mg total) by mouth 2 (two) times daily. 01/26/15  Yes Alison Murray, MD   simvastatin (ZOCOR) 40 MG tablet Take 40 mg by mouth daily.     Yes Historical Provider, MD  zolpidem (AMBIEN) 5 MG tablet Take 1 tablet (5 mg total) by mouth at bedtime as needed for sleep. 06/22/15  Yes Albertine Grates, MD   Physical Exam: Filed Vitals:   11/07/15 0100 11/07/15 0130  BP: 148/85 151/82  Pulse: 74 73  Temp:    Resp: 19 19    BP 151/82 mmHg  Pulse 73  Temp(Src) 98.5 F (36.9 C) (Oral)  Resp 19  Ht  (1.753 m)  Wt 60.782 kg (134 lb)  BMI 19.78 kg/m2  SpO2 100%  General Appearance:    Alert, oriented, no distress, appears stated age  Head:    Normocephalic, atraumatic  Eyes:    PERRL, EOMI, sclera non-icteric        Nose:   Nares without drainage or epistaxis. Mucosa, turbinates normal  Throat:   Moist mucous membranes. Oropharynx without erythema or exudate.  Neck:   Supple. No carotid bruits.  No thyromegaly.  No lymphadenopathy.   Back:     No CVA tenderness, no spinal tenderness  Lungs:     Clear to auscultation bilaterally, without wheezes, rhonchi or rales  Chest wall:    No tenderness to palpitation  Heart:    Regular rate and rhythm without murmurs, gallops, rubs  Abdomen:     Soft, non-tender, nondistended, normal bowel sounds, no organomegaly  Genitalia:    deferred  Rectal:    deferred  Extremities:   No clubbing, cyanosis or edema.  Pulses:   2+ and symmetric all extremities  Skin:   Skin color, texture, turgor normal, no rashes or lesions  Lymph nodes:   Cervical, supraclavicular, and axillary nodes normal  Neurologic:   CNII-XII intact. Normal strength, sensation and reflexes      throughout    Labs on Admission:  Basic Metabolic Panel:  Recent Labs Lab 11/06/15 2358 11/07/15 0032  NA 137 139  K 4.7 4.7  CL 106 103  CO2 23  --   GLUCOSE 423* 406*  BUN 57* 56*  CREATININE 2.45* 2.20*  CALCIUM 9.0  --    Liver Function Tests:  Recent Labs Lab 11/06/15 2358  AST 29  ALT 18  ALKPHOS 69  BILITOT 0.5  PROT 7.5  ALBUMIN 4.0    No results for input(s): LIPASE, AMYLASE in the last 168 hours. No results for input(s): AMMONIA in the last 168 hours. CBC:  Recent Labs Lab 11/06/15 2358 11/07/15 0032  WBC 10.0  --   NEUTROABS 7.3  --   HGB 13.3 15.0  HCT 38.2* 44.0  MCV 89.7  --   PLT 327  --    Cardiac Enzymes: No results for input(s): CKTOTAL, CKMB, CKMBINDEX, TROPONINI in the last 168 hours.  BNP (last 3 results) No results for input(s): PROBNP in the last 8760 hours. CBG:  Recent Labs Lab 11/06/15 2358  GLUCAP 376*    Radiological Exams on Admission: Dg Chest 2 View  11/07/2015  CLINICAL DATA:  Elevated blood sugar. Weakness and shortness of breath. EXAM: CHEST  2 VIEW COMPARISON:  09/17/2015 FINDINGS: Mild emphysematous changes in the lungs. Central interstitial pattern with bronchial thickening suggesting bronchitis. Streaky infiltrates in the right upper lung may represent early bronchopneumonia. No blunting of costophrenic angles. No pneumothorax. Mediastinal contours appear intact. Normal heart size and pulmonary vascularity. Degenerative changes in the spine. Calcification of the aorta. IMPRESSION: Emphysematous changes with bronchial wall thickening an perihilar interstitial changes suggesting acute or chronic bronchitis. Possible early infiltration in the right upper lung. Electronically Signed   By: Burman NievesWilliam  Stevens M.D.   On: 11/07/2015 01:03    EKG: Independently reviewed.  Assessment/Plan Principal Problem:   UTI (lower urinary tract infection) Active Problems:   Acute renal failure superimposed on stage 4 chronic kidney disease (HCC)   DM2 (diabetes mellitus, type 2) (HCC)   1. UTI - 1. Rocephin 2. Getting C.Diff scan as well due to intermittent diarrhea for several months 3. Culture pending 4. Resistant pseudomonas history noted but this appears to have been 9 years ago by our culture records, most recently was a pan sensitive citrobacter back in Nov last year. 2. DM2  - 1. glucostabilizer to get BGLs under control, currently over 400 in ED 3. AKI on CKD stage 4 - 1. IVF 2. Repeat BMP in AM    Code Status: Full  Family Communication: Family at bedside Disposition Plan: Admit to inpatient   Time spent: 70 min  Dent Plantz M. [facility] Pager [phone]  If 7AM-7PM, please contact the day team taking care of the patient [email or web address] Password [email or web address] 11/07/2015, 1:42 AM

## 2015-11-08 DIAGNOSIS — E1122 Type 2 diabetes mellitus with diabetic chronic kidney disease: Secondary | ICD-10-CM | POA: Diagnosis not present

## 2015-11-08 DIAGNOSIS — N179 Acute kidney failure, unspecified: Secondary | ICD-10-CM | POA: Diagnosis not present

## 2015-11-08 DIAGNOSIS — N184 Chronic kidney disease, stage 4 (severe): Secondary | ICD-10-CM

## 2015-11-08 DIAGNOSIS — N390 Urinary tract infection, site not specified: Secondary | ICD-10-CM | POA: Diagnosis present

## 2015-11-08 DIAGNOSIS — Z794 Long term (current) use of insulin: Secondary | ICD-10-CM

## 2015-11-08 LAB — GLUCOSE, CAPILLARY
GLUCOSE-CAPILLARY: 283 mg/dL — AB (ref 65–99)
GLUCOSE-CAPILLARY: 114 mg/dL — AB (ref 65–99)
GLUCOSE-CAPILLARY: 243 mg/dL — AB (ref 65–99)
Glucose-Capillary: 385 mg/dL — ABNORMAL HIGH (ref 65–99)
Glucose-Capillary: 322 mg/dL — ABNORMAL HIGH (ref 65–99)

## 2015-11-08 LAB — BASIC METABOLIC PANEL
ANION GAP: 13 (ref 5–15)
BUN: 44 mg/dL — ABNORMAL HIGH (ref 6–20)
CHLORIDE: 107 mmol/L (ref 101–111)
CO2: 23 mmol/L (ref 22–32)
Calcium: 9 mg/dL (ref 8.9–10.3)
Creatinine, Ser: 1.97 mg/dL — ABNORMAL HIGH (ref 0.61–1.24)
GFR calc non Af Amer: 29 mL/min — ABNORMAL LOW (ref >60)
GFR, EST AFRICAN AMERICAN: 34 mL/min — AB (ref >60)
GLUCOSE: 214 mg/dL — AB (ref 65–99)
POTASSIUM: 4.3 mmol/L (ref 3.5–5.1)
Sodium: 143 mmol/L (ref 135–145)

## 2015-11-08 LAB — CBC
HEMATOCRIT: 36.6 % — AB (ref 39.0–52.0)
HEMOGLOBIN: 12.4 g/dL — AB (ref 13.0–17.0)
MCH: 30.8 pg (ref 26.0–34.0)
MCHC: 33.9 g/dL (ref 30.0–36.0)
MCV: 91 fL (ref 78.0–100.0)
Platelets: 331 10*3/uL (ref 150–400)
RBC: 4.02 MIL/uL — ABNORMAL LOW (ref 4.22–5.81)
RDW: 13.8 % (ref 11.5–15.5)
WBC: 6.3 10*3/uL (ref 4.0–10.5)

## 2015-11-08 LAB — URINE CULTURE: CULTURE: NO GROWTH

## 2015-11-08 NOTE — Clinical Social Work Placement (Signed)
   CLINICAL SOCIAL WORK PLACEMENT  NOTE  Date:  11/08/2015  Patient Details  Name: Arrie AranJerry Popwell MRN: [id number] Date of Birth: [birth year]  Clinical Social Work is seeking post-discharge placement for this patient at the Skilled  Nursing Facility level of care (*CSW will initial, date and re-position this form in  chart as items are completed):  No (pt from [facility] and will d/c to [facility] for rehab)   Patient/family provided with [facility] Clinical Social Work Department's list of facilities offering this level of care within the geographic area requested by the patient (or if unable, by the patient's family).      Patient/family informed of their freedom to choose among providers that offer the needed level of care, that participate in Medicare, Medicaid or managed care program needed by the patient, have an available bed and are willing to accept the patient.      Patient/family informed of [facility] ownership interest in [facility] and [facility], as well as of the fact that they are under no obligation to receive care at these facilities.  PASRR submitted to EDS on  (n/a for [facility])     PASRR number received on       Existing PASRR number confirmed on       FL2 transmitted to all facilities in geographic area requested by pt/family on 11/08/15     FL2 transmitted to all facilities within larger geographic area on       Patient informed that his/her managed care company has contracts with or will negotiate with certain facilities, including the following:        Yes   Patient/family informed of bed offers received.  Patient chooses bed at [facility]     Physician recommends and patient chooses bed at      Patient to be transferred to [facility] on  .  Patient to be transferred to facility by       Patient family notified on   of transfer.  Name of family member notified:        PHYSICIAN Please sign FL2     Additional Comment:     _______________________________________________ Orson EvaKIDD, SUZANNA A, LCSW 11/08/2015, 2:35 PM

## 2015-11-08 NOTE — Progress Notes (Signed)
Nursing Note: Pt in and out cathed for 800 cc.Pt tolerated well.Cathed by NT each time.wbb

## 2015-11-08 NOTE — Clinical Social Work Note (Signed)
Clinical Social Work Assessment  Patient Details  Name: Benjamin Riddle MRN: [id number] Date of Birth: 12/09/1927  Date of referral:  11/08/15               Reason for consult:  Discharge Planning                Permission sought to share information with:  Family Supports Permission granted to share information::  Yes, Verbal Permission Granted  Name::     Kelvyn Schunk  Agency::     Relationship::  wife  Contact Information:  [phone]  Housing/Transportation Living arrangements for the past 2 months:  [place] of Information:  Patient Patient Interpreter Needed:  None Criminal Activity/Legal Involvement Pertinent to Current Situation/Hospitalization:  No - Comment as needed Significant Relationships:  Spouse Lives with:  Spouse Do you feel safe going back to the place where you live?  No Need for family participation in patient care:  No (Coment)  Care giving concerns:  Pt admitted from [facility]. PT recommending rehab at SNF section of [facility].   Social Worker assessment / plan:    CSW received referral for New SNF.  Pt admitted from [facility] with wife and PT recommending rehab at [facility] at [facility].   CSW met with pt at bedside. Pt wife not present. CSW discussed recommendation for rehab upon discharge at [facility]. Pt is agreeable. Pt states that pt wife will be at hospital later today.  CSW contacted [facility] to notify that pt needing higher level of care. [facility] can accept pt to their rehab upon discharge. Per MD, pt anticipated d/c tomorrow. CSW confirmed with [facility] that facility can accept pt over the weekend.  CSW sent clinical information to [facility].   CSW to continue to follow to assist with pt d/c to [facility].    Employment status:  Retired Nurse, adult PT Recommendations:  [place] / Referral to community resources:  [place]  Patient/Family's Response to care:  Pt alert and oriented x 3. Pt pleasant and calm throughout assessment. Pt agreeable to rehab at [facility]   Patient/Family's Understanding of and Emotional Response to Diagnosis, Current Treatment, and Prognosis:  Pt displayed understanding re: diagnosis and treatment plan.   Emotional Assessment Appearance:  Appears stated age Attitude/Demeanor/Rapport:  Other (cooperative) Affect (typically observed):  Appropriate Orientation:  Oriented to Self, Oriented to Place, Oriented to Situation Alcohol / Substance use:  Not Applicable Psych involvement (Current and /or in the community):  No (Comment)  Discharge Needs  Concerns to be addressed:  Discharge Planning Concerns Readmission within the last 30 days:  No Current discharge risk:  None Barriers to Discharge:  Continued Medical Work up   Ladell Pier, [place] 11/08/2015, 11:34 AM  [phone]

## 2015-11-08 NOTE — Progress Notes (Signed)
PROGRESS NOTE    Benjamin Riddle  GNF:[id number] DOB: [birth year] DOA: 11/06/2015 PCP: Benjamin Kayser, MD  Outpatient Specialists: Brief Narrative: Benjamin Riddle is a 82 y.o. male with h/o DM, CKD, recurrent UTIs. Patient presents to the ED with c/o hyperglycemia with BGL of over 600 6-7 hours ago. This was accompanied by generalized weakness. Despite being on chronic daily keflex to prevent UTI he also has had urinary frequency , weakness was severe enough that he did have a fall at home  Assessment & Plan:  1. DM/hyperglycemia -CBGs improved, continue lantus, SSI -likely triggered by infection -CBG last pm of 400, now improved  2. UTI, h/o same, self caths due to Neurogenic bladder, h/o bladder neck Sx -continue Ceftriaxone, FU Urine Cx-unfortunately wasnt sent from ER before Abx  3. AKi on CKD4 -baseline creatinine around 1.7 in 12/16 -on admission was 2.4, now 1.9, cut down IVF - monitor, bmet in am  4. Possible Pneumonia on CXR -no symptoms, repeat CXR without any acute findings  5. COPD -stable , nebs PRN  6. Weakness/debility/falls -PT eval completed, SNF recommended  DVT prophylaxis:Lovenox Code Status:Full Code Family Communication:wife at bedside Disposition Plan:SNF tomorrow    Consultants:   Procedures:   Antimicrobials:Ceftriaxone 4/20  Subjective: feels weak, wants to go home, better overall  Objective: Filed Vitals:   11/07/15 1421 11/07/15 2158 11/08/15 0611 11/08/15 0824  BP: 149/81 145/68 147/70   Pulse: 80 84 71   Temp: 98.3 F (36.8 C) 98.5 F (36.9 C) 98.1 F (36.7 C)   TempSrc: Oral Oral Oral   Resp: Height:      Weight:      SpO2: 100% 98% 100% 97%    Intake/Output Summary (Last 24 hours) at 11/08/15 1137 Last data filed at 11/08/15 0854  Gross per 24 hour  Intake   1800 ml  Output   3450 ml  Net  -1650 ml   Filed Weights   11/06/15 2317 11/07/15 0325  Weight: 60.782 kg (134 lb) 56.9 kg (125 lb 7.1 oz)     Examination:  General exam: Appears calm and comfortable  Respiratory system: Clear to auscultation. Respiratory effort normal. Cardiovascular system: S1 & S2 heard, RRR. No JVD, murmurs, rubs, gallops or clicks. No pedal edema. Gastrointestinal system: Abdomen is nondistended, soft and nontender. No organomegaly or masses felt. Normal bowel sounds heard. Central nervous system: Alert and oriented. No focal neurological deficits. Extremities: Symmetric 5 x 5 power. Skin: No rashes, lesions or ulcers Psychiatry: Judgement and insight appear normal. Mood & affect appropriate.     Data Reviewed: I have personally reviewed following labs and imaging studies  CBC:  Recent Labs Lab 11/06/15 2358 11/07/15 0032 11/07/15 0400 11/08/15 0422  WBC 10.0  --  9.8 6.3  NEUTROABS 7.3  --   --   --   HGB 13.3 15.0 13.8 12.4*  HCT 38.2* 44.0 41.3 36.6*  MCV 89.7  --  91.4 91.0  PLT 327  --  348 331   Basic Metabolic Panel:  Recent Labs Lab 11/06/15 2358 11/07/15 0032 11/07/15 0400 11/08/15 0422  NA 137 139 141 143  K 4.7 4.7 5.1 4.3  CL 106 103 107 107  CO2 23  --  22 23  GLUCOSE 423* 406* 84 214*  BUN 57* 56* 53* 44*  CREATININE 2.45* 2.20* 2.21* 1.97*  CALCIUM 9.0  --  9.2 9.0   GFR: Estimated Creatinine Clearance: 22.1 mL/min (by C-G formula based on  Cr of 1.97). Liver Function Tests:  Recent Labs Lab 11/06/15 2358  AST 29  ALT 18  ALKPHOS 69  BILITOT 0.5  PROT 7.5  ALBUMIN 4.0   No results for input(s): LIPASE, AMYLASE in the last 168 hours. No results for input(s): AMMONIA in the last 168 hours. Coagulation Profile: No results for input(s): INR, PROTIME in the last 168 hours. Cardiac Enzymes: No results for input(s): CKTOTAL, CKMB, CKMBINDEX, TROPONINI in the last 168 hours. BNP (last 3 results) No results for input(s): PROBNP in the last 8760 hours. HbA1C: No results for input(s): HGBA1C in the last 72 hours. CBG:  Recent Labs Lab 11/07/15 1136  11/07/15 1651 11/07/15 2146 11/08/15 0103 11/08/15 0733  GLUCAP 377* 397* 450* 283* 114*   Lipid Profile: No results for input(s): CHOL, HDL, LDLCALC, TRIG, CHOLHDL, LDLDIRECT in the last 72 hours. Thyroid Function Tests: No results for input(s): TSH, T4TOTAL, FREET4, T3FREE, THYROIDAB in the last 72 hours. Anemia Panel: No results for input(s): VITAMINB12, FOLATE, FERRITIN, TIBC, IRON, RETICCTPCT in the last 72 hours. Urine analysis:    Component Value Date/Time   COLORURINE YELLOW 11/06/2015 2323   APPEARANCEUR TURBID* 11/06/2015 2323   LABSPEC 1.015 11/06/2015 2323   PHURINE 6.5 11/06/2015 2323   GLUCOSEU >1000* 11/06/2015 2323   HGBUR SMALL* 11/06/2015 2323   BILIRUBINUR NEGATIVE 11/06/2015 2323   KETONESUR NEGATIVE 11/06/2015 2323   PROTEINUR 30* 11/06/2015 2323   UROBILINOGEN 0.2 04/06/2015 2033   NITRITE NEGATIVE 11/06/2015 2323   LEUKOCYTESUR LARGE* 11/06/2015 2323   Sepsis Labs: (procalcitonin:4,lacticidven:4)  ) Recent Results (from the past 240 hour(s))  Culture, Urine     Status: None   Collection Time: 11/07/15 10:03 AM  Result Value Ref Range Status   Specimen Description URINE, RANDOM  Final   Special Requests NONE  Final   Culture   Final    NO GROWTH 1 DAY Performed at [facility]    Report Status 11/08/2015 FINAL  Final         Radiology Studies: Dg Chest 2 View  11/07/2015  CLINICAL DATA:  recurrent UTIs. Patient presents to the ED with c/o hyperglycemia. This is accompanied by generalized weakness. Despite being on chronic daily keflex to prevent UTI he also has had urinary frequency today.There also intermittent diarrhea over the past few months since starting keflex. Hx asthma. Copd. Hx htn. EXAM: CHEST  2 VIEW COMPARISON:  11/07/2015 at 0043 hours FINDINGS: Cardiac silhouette is normal in size and configuration. No mediastinal or hilar masses or convincing adenopathy. There is upper lobe parenchymal scarring with areas of  pleural thickening, without change from the earlier study allowing for differences in patient positioning. There is no evidence of pneumonia or pulmonary edema. No pleural effusion or pneumothorax. Bony thorax is demineralized but grossly intact. IMPRESSION: 1. No acute cardiopulmonary disease. No change from the earlier exam. 2. COPD with upper lobe scarring. Electronically Signed   By: Amie Portland M.D.   On: 11/07/2015 10:41   Dg Chest 2 View  11/07/2015  CLINICAL DATA:  Elevated blood sugar. Weakness and shortness of breath. EXAM: CHEST  2 VIEW COMPARISON:  09/17/2015 FINDINGS: Mild emphysematous changes in the lungs. Central interstitial pattern with bronchial thickening suggesting bronchitis. Streaky infiltrates in the right upper lung may represent early bronchopneumonia. No blunting of costophrenic angles. No pneumothorax. Mediastinal contours appear intact. Normal heart size and pulmonary vascularity. Degenerative changes in the spine. Calcification of the aorta. IMPRESSION: Emphysematous changes with bronchial wall thickening  an perihilar interstitial changes suggesting acute or chronic bronchitis. Possible early infiltration in the right upper lung. Electronically Signed   By: Burman NievesWilliam  Stevens M.D.   On: 11/07/2015 01:03        Scheduled Meds: . cefTRIAXone (ROCEPHIN)  IV  1 g Intravenous Q24H  . feeding supplement (ENSURE ENLIVE)  237 mL Oral BID BM  . fluconazole (DIFLUCAN) IV  100 mg Intravenous Q24H  . heparin  5,000 Units Subcutaneous Q8H  . insulin aspart  0-5 Units Subcutaneous QHS  . insulin aspart  0-9 Units Subcutaneous TID WC  . insulin glargine  15 Units Subcutaneous QHS  . mometasone-formoterol  2 puff Inhalation BID  . saccharomyces boulardii  250 mg Oral BID  . simvastatin  40 mg Oral Daily   Continuous Infusions: . sodium chloride 1,000 mL (11/08/15 0651)     LOS: 1 day    Time spent: 35min    Benjamin CovePreetha Ronella Plunk, MD [facility] Pager  [phone]  If 7PM-7AM, please contact night-coverage [email or web address] Password [email or web address] 11/08/2015, 11:37 AM

## 2015-11-08 NOTE — Progress Notes (Signed)
Nursing Note : Pt in and out cathed for 850 cc after bladder scan and attempt t to void and unable .Tolertaed well.wbb

## 2015-11-08 NOTE — Progress Notes (Signed)
Nursing Note: Cbg 450.A: paged on-call. Orders received for 10 units of novolog, and 15 units of lantus insulin.wbb

## 2015-11-08 NOTE — [place] FL2 (Signed)
[place] MEDICAID FL2 LEVEL OF CARE SCREENING TOOL     IDENTIFICATION  Patient Name: Benjamin Riddle Birthdate: May 06, 1928 Sex: male Admission Date (Current Location): 11/06/2015  [facility] and [place] Number:  Producer, television/film/video and Address:  [facility],  [address] [place] [address]      Provider Number: [id number]  Attending Physician Name and Address:  Zannie Cove, MD  Relative Name and Phone Number:       Current Level of Care: Hospital Recommended Level of Care: Skilled Nursing Facility Prior Approval Number:    Date Approved/Denied:   PASRR Number:    Discharge Plan: SNF    Current Diagnoses: Patient Active Problem List   Diagnosis Date Noted  . DM2 (diabetes mellitus, type 2) (HCC) 11/07/2015  . URI (upper respiratory infection) 06/18/2015  . Bronchitis 06/18/2015  . Sepsis (HCC) 06/18/2015  . CAP (community acquired pneumonia) 06/18/2015  . Fall   . UTI (lower urinary tract infection)   . Bladder outlet obstruction 04/06/2015  . Protein-calorie malnutrition, severe (HCC) 01/25/2015  . DKA (diabetic ketoacidoses) (HCC) 01/23/2015  . Acute renal failure superimposed on stage 4 chronic kidney disease (HCC) 01/23/2015  . Nausea vomiting and diarrhea 01/04/2015  . Combined systolic and diastolic congestive heart failure (HCC)   . COPD GOLD III  11/23/2014  . Asthmatic bronchitis , chronic (HCC) 08/31/2014  . HLD (hyperlipidemia) 12/09/2009    Orientation RESPIRATION BLADDER Height & Weight     Self, Place, Situation  Normal Incontinent, Indwelling catheter Weight: 125 lb 7.1 oz (56.9 kg) Height:   (175.3 cm)  BEHAVIORAL SYMPTOMS/MOOD NEUROLOGICAL BOWEL NUTRITION STATUS   (No Behaviors)  (NONE) Continent Diet (Diet Carb Modified)  AMBULATORY STATUS COMMUNICATION OF NEEDS Skin   Limited Assist Verbally Normal                       Personal Care Assistance Level of Assistance  Bathing, Feeding, Dressing Bathing  Assistance: Limited assistance Feeding assistance: Independent Dressing Assistance: Limited assistance     Functional Limitations Info  Sight, Hearing, Speech Sight Info: Adequate Hearing Info: Adequate Speech Info: Adequate    SPECIAL CARE FACTORS FREQUENCY  PT (By licensed PT), OT (By licensed OT)     PT Frequency: 5 x a week OT Frequency: 5 x a week            Contractures Contractures Info: Not present    Additional Factors Info  Code Status, Allergies, Insulin Sliding Scale Code Status Info: FULL code status Allergies Info: Codeine, Gentamicin, Ciprofloxacin   Insulin Sliding Scale Info: 4 x a day       Current Medications (11/08/2015):  This is the current hospital active medication list Current Facility-Administered Medications  Medication Dose Route Frequency Provider Last Rate Last Dose  . 0.9 %  sodium chloride infusion   Intravenous Continuous Zannie Cove, MD 50 mL/hr at 11/08/15 1251    . albuterol (PROVENTIL) (2.5 MG/3ML) 0.083% nebulizer solution 3 mL  3 mL Inhalation Q4H PRN Hillary Bow, DO      . bismuth subsalicylate (PEPTO BISMOL) 262 MG/15ML suspension 30 mL  30 mL Oral Q6H PRN Hillary Bow, DO      . cefTRIAXone (ROCEPHIN) 1 g in dextrose 5 % 50 mL IVPB  1 g Intravenous Q24H Hillary Bow, DO   1 g at 11/08/15 0207  . dextrose 50 % solution 25 mL  25 mL Intravenous PRN Hillary Bow,  DO      . feeding supplement (ENSURE ENLIVE) (ENSURE ENLIVE) liquid 237 mL  237 mL Oral BID BM Hillary BowJared M Gardner, DO   237 mL at 11/07/15 1015  . fluconazole (DIFLUCAN) IVPB 100 mg  100 mg Intravenous Q24H Lorenza EvangelistElizabeth R Green, RPH   100 mg at 11/08/15 0645  . heparin injection 5,000 Units  5,000 Units Subcutaneous Q8H Hillary BowJared M Gardner, DO   5,000 Units at 11/08/15 0645  . insulin aspart (novoLOG) injection 0-5 Units  0-5 Units Subcutaneous QHS Leda GauzeKaren J Kirby-Graham, NP   0 Units at 11/07/15 2200  . insulin aspart (novoLOG) injection 0-9 Units  0-9 Units Subcutaneous  TID WC Leda GauzeKaren J Kirby-Graham, NP   3 Units at 11/08/15 1250  . insulin glargine (LANTUS) injection 15 Units  15 Units Subcutaneous QHS Leda GauzeKaren J Kirby-Graham, NP   15 Units at 11/07/15 2323  . mometasone-formoterol (DULERA) 100-5 MCG/ACT inhaler 2 puff  2 puff Inhalation BID Hillary BowJared M Gardner, DO   2 puff at 11/08/15 [phone]  . saccharomyces boulardii (FLORASTOR) capsule 250 mg  250 mg Oral BID Hillary BowJared M Gardner, DO   250 mg at 11/08/15 1019  . simvastatin (ZOCOR) tablet 40 mg  40 mg Oral Daily Hillary BowJared M Gardner, DO   40 mg at 11/07/15 1703  . zolpidem (AMBIEN) tablet 5 mg  5 mg Oral QHS PRN Hillary BowJared M Gardner, DO         Discharge Medications: Please see discharge summary for a list of discharge medications.  Relevant Imaging Results:  Relevant Lab Results:   Additional Information SSN: [id number]  Benjamin Riddle, Selena LesserSUZANNA A, LCSW

## 2015-11-08 NOTE — Evaluation (Signed)
Physical Therapy Evaluation Patient Details Name: Benjamin AranJerry Whiteman MRN: [id number] DOB: 1927/10/24 Today's Date: 11/08/2015   History of Present Illness  82 y.o. male with h/o DM, CKD, recurrent UTIs, COPD, HTN, neurogenic bladder (self cath) . Patient admitted with hyperglycemia and UTI  Clinical Impression  Pt admitted with above diagnosis. Pt currently with functional limitations due to the deficits listed below (see PT Problem List).  Pt will benefit from skilled PT to increase their independence and safety with mobility to allow discharge to the venue listed below.  Pt assisted with ambulating in hallway today and requiring min assist for mobility.  Pt's spouse reports increased weakness and decreased functioning a couple weeks prior to admission and does not feel she can assist pt safely at home.  Pt has had 2 falls PTA and unable to rise from floor requiring help (spouse also unable to lift him).  Pt would benefit from ST-SNF prior to return home with spouse.     Follow Up Recommendations SNF;Supervision/Assistance - 24 hour    Equipment Recommendations  None recommended by PT    Recommendations for Other Services       Precautions / Restrictions Precautions Precautions: Fall Restrictions Weight Bearing Restrictions: No      Mobility  Bed Mobility Overal bed mobility: Needs Assistance Bed Mobility: Supine to Sit     Supine to sit: Min assist     General bed mobility comments: assist for trunk upright  Transfers Overall transfer level: Needs assistance Equipment used: Rolling walker (2 wheeled) Transfers: Sit to/from Stand Sit to Stand: Min assist         General transfer comment: assist to rise and steady, verbal cues for hand placement  Ambulation/Gait Ambulation/Gait assistance: Min assist Ambulation Distance (Feet): 120 Feet Assistive device: Rolling walker (2 wheeled) Gait Pattern/deviations: Step-to pattern;Decreased stance time - right;Shuffle Gait  velocity: decr   General Gait Details: pt leads with R LE when ambulating, poor clearance of feet from floor, occasional assist for steadying, verbal cues for RW positioning  Stairs            Wheelchair Mobility    Modified Rankin (Stroke Patients Only)       Balance Overall balance assessment: History of Falls                                           Pertinent Vitals/Pain Pain Assessment: No/denies pain    Home Living Family/patient expects to be discharged to:: Private residence Living Arrangements: Spouse/significant other   Type of Home: Independent living facility       Home Layout: One level Home Equipment: Environmental consultantWalker - 2 wheels;Cane - single point;Walker - 4 wheels;Shower seat      Prior Function Level of Independence: Needs assistance   Gait / Transfers Assistance Needed: 2 falls PTA, uses RW or cane  ADL's / Homemaking Assistance Needed: has needed more assist recently due to weakness - showering seated        Hand Dominance        Extremity/Trunk Assessment   Upper Extremity Assessment: Generalized weakness           Lower Extremity Assessment: Generalized weakness      Cervical / Trunk Assessment: Kyphotic  Communication   Communication: HOH  Cognition Arousal/Alertness: Awake/alert Behavior During Therapy: WFL for tasks assessed/performed Overall Cognitive Status: Within Functional Limits for tasks assessed  General Comments      Exercises        Assessment/Plan    PT Assessment Patient needs continued PT services  PT Diagnosis Difficulty walking;Generalized weakness   PT Problem List Decreased strength;Decreased activity tolerance;Decreased mobility;Decreased balance;Decreased knowledge of use of DME  PT Treatment Interventions DME instruction;Gait training;Functional mobility training;Patient/family education;Therapeutic activities;Therapeutic exercise;Balance training   PT  Goals (Current goals can be found in the Care Plan section) Acute Rehab PT Goals PT Goal Formulation: With patient Time For Goal Achievement: 11/15/15 Potential to Achieve Goals: Good    Frequency Min 3X/week   Barriers to discharge        Co-evaluation               End of Session Equipment Utilized During Treatment: Gait belt Activity Tolerance: Patient tolerated treatment well Patient left: in chair;with call bell/phone within reach;with chair alarm set;with family/visitor present Nurse Communication: Mobility status         Time: 1610-9604 PT Time Calculation (min) (ACUTE ONLY): 15 min   Charges:   PT Evaluation $PT Eval Moderate Complexity: 1 Procedure     PT G Codes:        Jaydn Moscato,KATHrine E 11/08/2015, 12:42 PM Zenovia Jarred, PT, DPT 11/08/2015 Pager: [phone]

## 2015-11-08 NOTE — Progress Notes (Signed)
Nursing Note: [id number]

## 2015-11-08 NOTE — Care Management Obs Status (Signed)
MEDICARE OBSERVATION STATUS NOTIFICATION   Patient Details  Name: Benjamin Riddle MRN: [id number] Date of Birth: 1928/06/06   Medicare Observation Status Notification Given:  Yes    Bartholome BillCLEMENTS, Amando Chaput H, RN 11/08/2015, 3:49 PM

## 2015-11-09 DIAGNOSIS — N179 Acute kidney failure, unspecified: Secondary | ICD-10-CM | POA: Diagnosis not present

## 2015-11-09 DIAGNOSIS — N390 Urinary tract infection, site not specified: Secondary | ICD-10-CM | POA: Diagnosis not present

## 2015-11-09 DIAGNOSIS — N184 Chronic kidney disease, stage 4 (severe): Secondary | ICD-10-CM | POA: Diagnosis not present

## 2015-11-09 LAB — BASIC METABOLIC PANEL
ANION GAP: 9 (ref 5–15)
BUN: 34 mg/dL — ABNORMAL HIGH (ref 6–20)
CALCIUM: 8.7 mg/dL — AB (ref 8.9–10.3)
CO2: 22 mmol/L (ref 22–32)
CREATININE: 1.75 mg/dL — AB (ref 0.61–1.24)
Chloride: 109 mmol/L (ref 101–111)
GFR calc Af Amer: 39 mL/min — ABNORMAL LOW (ref >60)
GFR calc non Af Amer: 34 mL/min — ABNORMAL LOW (ref >60)
GLUCOSE: 203 mg/dL — AB (ref 65–99)
Potassium: 4.6 mmol/L (ref 3.5–5.1)
Sodium: 140 mmol/L (ref 135–145)

## 2015-11-09 LAB — GLUCOSE, CAPILLARY
Glucose-Capillary: 167 mg/dL — ABNORMAL HIGH (ref 65–99)
Glucose-Capillary: 347 mg/dL — ABNORMAL HIGH (ref 65–99)

## 2015-11-09 LAB — CBC
HEMATOCRIT: 33.2 % — AB (ref 39.0–52.0)
Hemoglobin: 11.2 g/dL — ABNORMAL LOW (ref 13.0–17.0)
MCH: 30.9 pg (ref 26.0–34.0)
MCHC: 33.7 g/dL (ref 30.0–36.0)
MCV: 91.5 fL (ref 78.0–100.0)
Platelets: 318 10*3/uL (ref 150–400)
RBC: 3.63 MIL/uL — ABNORMAL LOW (ref 4.22–5.81)
RDW: 13.8 % (ref 11.5–15.5)
WBC: 5.7 10*3/uL (ref 4.0–10.5)

## 2015-11-09 MED ORDER — CEFPODOXIME PROXETIL 100 MG PO TABS: 100.0000 mg | ORAL_TABLET | Freq: Two times a day (BID) | ORAL | Status: DC

## 2015-11-09 MED ORDER — FLUCONAZOLE 100 MG PO TABS: 100.0000 mg | ORAL_TABLET | Freq: Every day | ORAL | Status: DC

## 2015-11-09 MED ORDER — INSULIN GLARGINE 300 UNIT/ML ~~LOC~~ SOPN: 14.0000 [IU] | PEN_INJECTOR | Freq: Every day | SUBCUTANEOUS | Status: DC

## 2015-11-09 NOTE — Clinical Social Work Placement (Addendum)
   CLINICAL SOCIAL WORK PLACEMENT  NOTE 11/09/15 - DISCHARGED TO [facility]  Date:  11/09/2015  Patient Details  Name: Benjamin Riddle MRN: [id number] Date of Birth: May 23, 1928  Clinical Social Work is seeking post-discharge placement for this patient at the Skilled  Nursing Facility level of care (*CSW will initial, date and re-position this form in  chart as items are completed):  No (pt from [facility] and will d/c to [facility] for rehab)   Patient/family provided with [facility] Clinical [facility] Work Department's list of facilities offering this level of care within the geographic area requested by the patient (or if unable, by the patient's family).      Patient/family informed of their freedom to choose among providers that offer the needed level of care, that participate in Medicare, Medicaid or managed care program needed by the patient, have an available bed and are willing to accept the patient.      Patient/family informed of [facility]'s ownership interest in [facility] and [facility], as well as of the fact that they are under no obligation to receive care at these facilities.  PASRR submitted to EDS on  (n/a for [facility])     PASRR number received on       Existing PASRR number confirmed on       FL2 transmitted to all facilities in geographic area requested by pt/family on 11/08/15     FL2 transmitted to all facilities within larger geographic area on       Patient informed that his/her managed care company has contracts with or will negotiate with certain facilities, including the following:        Yes   Patient/family informed of bed offers received.  Patient chooses bed at [facility]     Physician recommends and patient chooses bed at      Patient to be transferred to [facility] on  11/09/15.  Patient to be transferred to facility by  ambulance     Patient family notified on  11/09/15 of transfer.  Name of family  member notified:   Spouse Ron Parkerngrid Klosinski at the bedside. Mrs. Hosie advised CSW that she will transport patient.     PHYSICIAN Please sign FL2     Additional Comment:    _______________________________________________ Cristobal Goldmannrawford, Quinita Kostelecky Bradley, LCSW 11/09/2015, 10:39 AM

## 2015-11-09 NOTE — Progress Notes (Signed)
Patient stable from AM assessment.  Patient's wife given discharge AVS.  [facility] vehicle with wife.  Report called to Selena BattenKim that will receive patient at facility.

## 2015-11-09 NOTE — Discharge Summary (Signed)
Physician Discharge Summary  [place] Fant ZOX:[id number] DOB: 02-19-1928 DOA: 11/06/2015  PCP: Ezequiel Kayser, MD  Admit date: 11/06/2015 Discharge date: 11/09/2015  Time spent: 45 minutes  Recommendations for Outpatient Follow-up:  1. PCP in 1 week at SNF 2. PT self catheterizes himself 2-3times a day   Discharge Diagnoses:  Principal Problem:   UTI (lower urinary tract infection) Active Problems:   Acute renal failure superimposed on stage 4 chronic kidney disease (HCC)   DM2 (diabetes mellitus, type 2) (HCC)   UTI (urinary tract infection)   Discharge Condition:stable  Diet recommendation: DM/heart healthy  Filed Weights   11/06/15 2317 11/07/15 0325  Weight: 60.782 kg (134 lb) 56.9 kg (125 lb 7.1 oz)    History of present illness:  Benjamin Riddle is a 82 y.o. male with h/o DM, CKD, recurrent UTIs. Patient presents to the ED with c/o hyperglycemia with BGL of over 600 6-7 hours prior to admission, was accompanied by generalized weakness. Despite being on chronic daily keflex to prevent UTI he also has had urinary frequency , weakness was severe enough that he did have a fall at home  Hospital Course:  1. DM/hyperglycemia -CBGs improved, after initial fluctuation, continue lantus, SSI -likely triggered by infection -resume toujeo Insulin after discharge  2. UTI, h/o recurrent UTIs, self caths due to Neurogenic bladder, h/o bladder neck Sx -continue Ceftriaxone, Urine Cx-unfortunately was not sent from ER before Abx started -culture sent after Abx started,  negative thus far -UA with yeast, improved on Ceftriaxone and Fluconazole -continue Fluconazole for days and PO Cefpodoxime for 3days -self catheterizes himself 2-3 times a day  3. AKi on CKD4 -baseline creatinine around 1.7 in 12/16 -on admission was 2.4, now down to 1.7 after hydration  4. Possible Pneumonia on initial CXR -no symptoms, repeat CXR without any acute findings  5. COPD -stable , nebs  PRN  6. Weakness/debility/falls -PT eval completed, SNF recommended  Discharge Exam: Filed Vitals:   11/08/15 2117 11/09/15 0606  BP: 134/72 149/79  Pulse: 72 64  Temp: 98.7 F (37.1 C) 98.4 F (36.9 C)  Resp: 18 18    General: AAOx3 Cardiovascular: S1S2/RRR Respiratory: CTAB  Discharge Instructions   Discharge Instructions    Diet - low sodium heart healthy    Complete by:  As directed      Diet Carb Modified    Complete by:  As directed      Increase activity slowly    Complete by:  As directed           Current Discharge Medication List    START taking these medications   Details  cefpodoxime (VANTIN) 100 MG tablet Take 1 tablet (100 mg total) by mouth 2 (two) times daily. For 3days Qty: 6 tablet, Refills: 0    fluconazole (DIFLUCAN) 100 MG tablet Take 1 tablet (100 mg total) by mouth daily. For 4days Qty: 4 tablet, Refills: 0      CONTINUE these medications which have CHANGED   Details  Insulin Glargine (TOUJEO SOLOSTAR) 300 UNIT/ML SOPN Inject 14 Units into the skin at bedtime.      CONTINUE these medications which have NOT CHANGED   Details  albuterol (2.5 MG/3ML) 0.083% NEBU 3 mL, albuterol (5 MG/ML) 0.5% NEBU 0.5 mL Inhale 5 mg into the lungs every 6 (six) hours as needed (wheezing).    albuterol (PROVENTIL,VENTOLIN) 90 MCG/ACT inhaler Inhale 2 puffs into the lungs every 4 (four) hours as needed for wheezing or shortness of  breath.     bismuth subsalicylate (PEPTO BISMOL) 262 MG/15ML suspension Take 30 mLs by mouth every 6 (six) hours as needed for indigestion or diarrhea or loose stools.    cholecalciferol (VITAMIN D) 1000 UNITS tablet Take 1,000 Units by mouth daily.    insulin lispro (HUMALOG) 100 UNIT/ML injection Inject 4-16 Units into the skin 3 (three) times daily as needed for high blood sugar (high blood sugar). Injects 8 units at breakfast, 4 units lunch, 8 to 10 units at dinner  Adjusted to blood glucose level. Patient adds an additional  unit for every 50mg /dL    mometasone-formoterol (DULERA) 100-5 MCG/ACT AERO Inhale 2 puffs into the lungs 2 (two) times daily.    saccharomyces boulardii (FLORASTOR) 250 MG capsule Take 1 capsule (250 mg total) by mouth 2 (two) times daily. Qty: 60 capsule, Refills: 0    simvastatin (ZOCOR) 40 MG tablet Take 40 mg by mouth daily.      zolpidem (AMBIEN) 5 MG tablet Take 1 tablet (5 mg total) by mouth at bedtime as needed for sleep. Qty: 30 tablet, Refills: 0       Allergies  Allergen Reactions  . Codeine Nausea And Vomiting  . Gentamicin     Becomes dizzy and weak  . Ciprofloxacin Itching and Rash   Follow-up Information    Follow up with PERINI,MARK A, MD. Schedule an appointment as soon as possible for a visit in 1 week.   Specialty:  Internal Medicine   Contact information:   [address] [place] [id number] [phone]        The results of significant diagnostics from this hospitalization (including imaging, microbiology, ancillary and laboratory) are listed below for reference.    Significant Diagnostic Studies: Dg Chest 2 View  11/07/2015  CLINICAL DATA:  recurrent UTIs. Patient presents to the ED with c/o hyperglycemia. This is accompanied by generalized weakness. Despite being on chronic daily keflex to prevent UTI he also has had urinary frequency today.There also intermittent diarrhea over the past few months since starting keflex. Hx asthma. Copd. Hx htn. EXAM: CHEST  2 VIEW COMPARISON:  11/07/2015 at 0043 hours FINDINGS: Cardiac silhouette is normal in size and configuration. No mediastinal or hilar masses or convincing adenopathy. There is upper lobe parenchymal scarring with areas of pleural thickening, without change from the earlier study allowing for differences in patient positioning. There is no evidence of pneumonia or pulmonary edema. No pleural effusion or pneumothorax. Bony thorax is demineralized but grossly intact. IMPRESSION: 1. No acute  cardiopulmonary disease. No change from the earlier exam. 2. COPD with upper lobe scarring. Electronically Signed   By: Amie Portlandavid  Ormond M.D.   On: 11/07/2015 10:41   Dg Chest 2 View  11/07/2015  CLINICAL DATA:  Elevated blood sugar. Weakness and shortness of breath. EXAM: CHEST  2 VIEW COMPARISON:  09/17/2015 FINDINGS: Mild emphysematous changes in the lungs. Central interstitial pattern with bronchial thickening suggesting bronchitis. Streaky infiltrates in the right upper lung may represent early bronchopneumonia. No blunting of costophrenic angles. No pneumothorax. Mediastinal contours appear intact. Normal heart size and pulmonary vascularity. Degenerative changes in the spine. Calcification of the aorta. IMPRESSION: Emphysematous changes with bronchial wall thickening an perihilar interstitial changes suggesting acute or chronic bronchitis. Possible early infiltration in the right upper lung. Electronically Signed   By: Burman NievesWilliam  Stevens M.D.   On: 11/07/2015 01:03    Microbiology: Recent Results (from the past 240 hour(s))  Culture, Urine     Status: None  Collection Time: 11/07/15 10:03 AM  Result Value Ref Range Status   Specimen Description URINE, RANDOM  Final   Special Requests NONE  Final   Culture   Final    NO GROWTH 1 DAY Performed at [facility]    Report Status 11/08/2015 FINAL  Final     Labs: Basic Metabolic Panel:  Recent Labs Lab 11/06/15 2358 11/07/15 0032 11/07/15 0400 11/08/15 0422 11/09/15 0354  NA 137 139 141 143 140  K 4.7 4.7 5.1 4.3 4.6  CL 106 103 107 107 109  CO2 23  --  GLUCOSE 423* 406* 84 214* 203*  BUN 57* 56* 53* 44* 34*  CREATININE 2.45* 2.20* 2.21* 1.97* 1.75*  CALCIUM 9.0  --  9.2 9.0 8.7*   Liver Function Tests:  Recent Labs Lab 11/06/15 2358  AST 29  ALT 18  ALKPHOS 69  BILITOT 0.5  PROT 7.5  ALBUMIN 4.0   No results for input(s): LIPASE, AMYLASE in the last 168 hours. No results for input(s): AMMONIA in the  last 168 hours. CBC:  Recent Labs Lab 11/06/15 2358 11/07/15 0032 11/07/15 0400 11/08/15 0422 11/09/15 0354  WBC 10.0  --  9.8 6.3 5.7  NEUTROABS 7.3  --   --   --   --   HGB 13.3 15.0 13.8 12.4* 11.2*  HCT 38.2* 44.0 41.3 36.6* 33.2*  MCV 89.7  --  91.4 91.0 91.5  PLT 327  --  348 331 318   Cardiac Enzymes: No results for input(s): CKTOTAL, CKMB, CKMBINDEX, TROPONINI in the last 168 hours. BNP: BNP (last 3 results) No results for input(s): BNP in the last 8760 hours.  ProBNP (last 3 results) No results for input(s): PROBNP in the last 8760 hours.  CBG:  Recent Labs Lab 11/08/15 0733 11/08/15 1204 11/08/15 1708 11/08/15 2119 11/09/15 0752  GLUCAP 114* 243* 385* 322* 167*       SignedZannie Cove MD.  [facility] 11/09/2015, 8:47 AM

## 2015-11-12 ENCOUNTER — Encounter: Payer: Self-pay | Admitting: Internal Medicine

## 2015-11-12 ENCOUNTER — Non-Acute Institutional Stay (SKILLED_NURSING_FACILITY): Payer: Medicare Other | Admitting: Internal Medicine

## 2015-11-12 DIAGNOSIS — N179 Acute kidney failure, unspecified: Secondary | ICD-10-CM

## 2015-11-12 DIAGNOSIS — R531 Weakness: Secondary | ICD-10-CM | POA: Diagnosis not present

## 2015-11-12 DIAGNOSIS — N184 Chronic kidney disease, stage 4 (severe): Secondary | ICD-10-CM | POA: Diagnosis not present

## 2015-11-12 DIAGNOSIS — N390 Urinary tract infection, site not specified: Secondary | ICD-10-CM

## 2015-11-12 DIAGNOSIS — J449 Chronic obstructive pulmonary disease, unspecified: Secondary | ICD-10-CM

## 2015-11-12 DIAGNOSIS — N320 Bladder-neck obstruction: Secondary | ICD-10-CM

## 2015-11-12 DIAGNOSIS — Z794 Long term (current) use of insulin: Secondary | ICD-10-CM | POA: Diagnosis not present

## 2015-11-12 DIAGNOSIS — W19XXXA Unspecified fall, initial encounter: Secondary | ICD-10-CM

## 2015-11-12 DIAGNOSIS — E1122 Type 2 diabetes mellitus with diabetic chronic kidney disease: Secondary | ICD-10-CM

## 2015-11-12 NOTE — Progress Notes (Signed)
Patient ID: Benjamin Riddle, male   DOB: 1927/09/14, 82 y.o.   MRN: [id number]  Provider:  Gwenith Spitz. Renato Gails, D.O., C.M.D. Location:  [facility] Room Number: 141 Place of Service:  SNF (31)  PCP: Ezequiel Kayser, MD Patient Care Team: Rodrigo Ran, MD as PCP - General (Internal Medicine) Barron Alvine, MD as Consulting Physician (Urology) Hali Marry, MD as Consulting Physician (Endocrinology) Jethro Bolus, MD as Consulting Physician (Ophthalmology) Cammy Copa, MD as Consulting Physician (Orthopedic Surgery) Sharrell Ku, MD as Consulting Physician (Gastroenterology) Peter M [place], MD as Consulting Physician (Cardiology) Merwyn Katos, MD as Consulting Physician (Pulmonary Disease)  Extended Emergency Contact Information Primary Emergency Contact: Beissel,Ingrid Address: [address]          [place] [address] [place] Home Phone: [phone] Mobile Phone: [phone] Relation: Spouse Secondary Emergency Contact: Dierdre Highman [place] of [place] Mobile Phone: [phone] Relation: Friend  Code Status: Full code Goals of Care: Advanced Directive information Advanced Directives 11/12/2015  Does patient have an advance directive? Yes  Type of Advance Directive Out of facility DNR (pink MOST or yellow form)  Copy of advanced directive(s) in chart? Yes  Would patient like information on creating an advanced directive? -  Pre-existing out of facility DNR order (yellow form or pink MOST form) Pink MOST form placed in chart (order not valid for inpatient use)   Chief Complaint  Patient presents with  . New Admit To SNF    rehab admit    HPI: Patient is a 82 y.o. male with h/o DMII with CKDIV, recurrent UTIs on chronic daily keflex, neurogenic bladder with bid straight catheterization, prior bladder neck surgery, COPD (follows with Dr. Sherene Sires) seen today for admission to [facility] re[facility] s/p hospitalization  with fall, lethargy, hyperglycemia (CBG >600), generalized weakness, and urinary frequency.  His urinalysis and culture showed only yeast (culture obtained after abx in his system).  He was started on ceftriaxone and fluconazole.  While hospitalized, he was on lantus and humalog insulin for meal coverage.  He had acute on chronic renal failure (cr up to 2.4 from 1.7 baseline, but was trending down at d/c).  There were concerns about a chest xray showing infiltrates, but repeat was negative and he did not have other symptoms outside of his usual wheezing from COPD.  He was evaluated by PT at the hospital and felt to require SNF for rehab.    At hospital discharge, he was sent here on 4 more days of fluconazole daily and 3 more days of cefpodoxime bid. His home insulin was resumed (toujeo 12 units and humalog 8 units at breakfast, 4 units at lunch and 8-10 at dinner with sliding scale adjustment).    When seen today, he denies an [place] allergy.  Does report cipro causes a rash.  These two allergies were not in epic but were in [facility] records when reviewed and CMA abstracted all the health maintenance and relevant history that was not entered during hospitalization (pt's PCP, Dr. Waynard [facility] at [facility], not in [facility]).  He has no complaints at present.  He is still feeling somewhat weak.  No urinary complaints at present.  Straight caths sterile approach am and hs.  Bowels are moving.  Is walking with a rollator walker at present, but does not always use it at home, sometimes uses cane.  Had not been falling prior to this episode.  He lives with his wife in a [place].  She is in good  health by report.   MMSE 28/30 today.  Past Medical History  Diagnosis Date  . Diabetes mellitus   . Asthma   . Hydronephrosis   . Kidney disease     Stage III chronic kidney disease  . Hypertension   . Neurogenic bladder   . Osteoporosis   . Hypogonadism male   . Recurrent urinary tract infection     With resistent Pseudomonas  .  Allergic rhinitis   . Hepatitis C     due to blood transfusion  . Hyperlipidemia   . SOB (shortness of breath)   . CKD (chronic kidney disease), stage III   . Combined systolic and diastolic congestive heart failure (HCC)   . COPD (chronic obstructive pulmonary disease) (HCC)   . Allergy   . Cataract   . Heart murmur    Past Surgical History  Procedure Laterality Date  . Hernia repair    . Tonsillectomy    . Bladder repair      obstruction surgery  . Frontal sinus obliteration      sinus repaired with a plate  . Cataract extraction  2015  . Eye surgery    . Fracture surgery    . Prostate surgery    . Spine surgery      reports that he quit smoking about 4 years ago. His smoking use included Cigars and Cigarettes. He has never used smokeless tobacco. He reports that he does not drink alcohol or use illicit drugs. Social History   Social History  . Marital Status: Married    Spouse Name: Jerene Dilling  . Number of Children: 0  . Years of Education: MD   Occupational History  . Retired Other   Social History Main Topics  . Smoking status: Former Smoker    Types: Cigars, Cigarettes    Quit date: 07/21/2011  . Smokeless tobacco: Never Used     Comment: smoked occ cigar and pipe  . Alcohol Use: No  . Drug Use: No  . Sexual Activity: Not on file   Other Topics Concern  . Not on file   Social History Narrative   Patient lives at home with spouse.   Caffeine Use: 3 cups daily    Functional Status Survey: Is the patient deaf or have difficulty hearing?: No Does the patient have difficulty seeing, even when wearing glasses/contacts?: No Does the patient have difficulty concentrating, remembering, or making decisions?: No Does the patient have difficulty walking or climbing stairs?: Yes (currently due to weakness/UTI) Does the patient have difficulty dressing or bathing?: No Does the patient have difficulty doing errands alone such as visiting a doctor's office or  shopping?: Yes  Family History  Problem Relation Age of Onset  . Heart failure Mother   . Hypertension Mother   . COPD Father     smoked  . Diabetes Father   . Cancer Sister     Breast  . Diabetes Sister   . COPD Brother     smoked    Health Maintenance  Topic Date Due  . FOOT EXAM  06/18/1940  . TETANUS/TDAP  06/18/1949  . ZOSTAVAX  06/18/1990  . PNA vac Low Risk Adult (2 of 2 - PPSV23) 07/21/2015  . HEMOGLOBIN A1C  01/23/2016  . OPHTHALMOLOGY EXAM  01/28/2016  . INFLUENZA VACCINE  02/18/2016  . URINE MICROALBUMIN  07/25/2016    Allergies  Allergen Reactions  . Aspirin   . Bactrim [Sulfamethoxazole-Trimethoprim]   . Codeine Nausea And Vomiting  .  Gentamicin     Becomes dizzy and weak  . Lexapro [Escitalopram Oxalate]   . Protonix [Pantoprazole Sodium]   . Ciprofloxacin Itching and Rash      Medication List       This list is accurate as of: 11/12/15 12:10 PM.  Always use your most recent med list.               albuterol (2.5 MG/3ML) 0.083% NEBU 3 mL, albuterol (5 MG/ML) 0.5% NEBU 0.5 mL  Inhale 5 mg into the lungs every 6 (six) hours as needed (wheezing).     albuterol 90 MCG/ACT inhaler  Commonly known as:  PROVENTIL,VENTOLIN  Inhale 2 puffs into the lungs every 4 (four) hours as needed for wheezing or shortness of breath.     bismuth subsalicylate 262 MG/15ML suspension  Commonly known as:  PEPTO BISMOL  Take 30 mLs by mouth every 6 (six) hours as needed for indigestion or diarrhea or loose stools.     cholecalciferol 1000 units tablet  Commonly known as:  VITAMIN D  Take 1,000 Units by mouth daily.     DULERA 100-5 MCG/ACT Aero  Generic drug:  mometasone-formoterol  Inhale 2 puffs into the lungs 2 (two) times daily.     fluconazole 100 MG tablet  Commonly known as:  DIFLUCAN  Take 1 tablet (100 mg total) by mouth daily. For 4days     insulin lispro 100 UNIT/ML injection  Commonly known as:  HUMALOG  Inject 15 Units into the skin 3 (three)  times daily before meals.     saccharomyces boulardii 250 MG capsule  Commonly known as:  FLORASTOR  Take 1 capsule (250 mg total) by mouth 2 (two) times daily.     simvastatin 40 MG tablet  Commonly known as:  ZOCOR  Take 40 mg by mouth daily.     TOUJEO SOLOSTAR 300 UNIT/ML Sopn  Generic drug:  Insulin Glargine  Inject 12 Units into the skin at bedtime.     zolpidem 5 MG tablet  Commonly known as:  AMBIEN  Take 1 tablet (5 mg total) by mouth at bedtime as needed for sleep.        Review of Systems  Constitutional: Negative for fever and chills.  HENT: Negative for congestion.   Eyes:       Glasses  Respiratory: Negative for chest tightness and shortness of breath.   Cardiovascular: Negative for chest pain and palpitations.  Gastrointestinal: Negative for nausea, vomiting, abdominal pain, diarrhea and constipation.  Genitourinary: Positive for frequency.       Neurogenic bladder, straight caths bid  Musculoskeletal: Negative for myalgias and arthralgias.  Skin: Negative for rash.  Neurological: Positive for weakness. Negative for dizziness.  Hematological: Bruises/bleeds easily.  Psychiatric/Behavioral: Positive for sleep disturbance. Negative for confusion.    Filed Vitals:   11/12/15 0944  BP: 141/78  Pulse: 62  Temp: 97.7 F (36.5 C)  TempSrc: Oral  Resp: 18  Height: 5\' 8"  (1.727 m)  Weight: 128 lb (58.06 kg)  SpO2: 96%   Body mass index is 19.47 kg/(m^2). Physical Exam  Constitutional: He is oriented to person, place, and time. No distress.  Chronically ill appearing thin male  HENT:  Head: Normocephalic and atraumatic.  Eyes: Conjunctivae are normal. Pupils are equal, round, and reactive to light.  glasses  Neck: Neck supple.  Cardiovascular: Normal rate, regular rhythm, normal heart sounds and intact distal pulses.   Pulmonary/Chest: Effort normal. He has wheezes.  Abdominal:  Soft. Bowel sounds are normal. He exhibits distension. He exhibits no  mass. There is no tenderness. There is no rebound and no guarding.  Musculoskeletal: Normal range of motion.  Neurological: He is alert and oriented to person, place, and time.  Skin: Skin is warm and dry.  Abrasions of back; ecchymoses left hand  Psychiatric:  Flat affect     Labs reviewed: Basic Metabolic Panel:  Recent Labs  91/47/82 1653  11/07/15 0400 11/08/15 0422 11/09/15 0354  NA 136  < > 141 143 140  K 4.9  < > 5.1 4.3 4.6  CL 105  < > 107 107 109  CO2 20*  < > 22 23 22   GLUCOSE 474*  < > 84 214* 203*  BUN 50*  < > 53* 44* 34*  CREATININE 2.07*  < > 2.21* 1.97* 1.75*  CALCIUM 8.1*  < > 9.2 9.0 8.7*  MG 2.1  --   --   --   --   PHOS 3.3  --   --   --   --   < > = values in this interval not displayed. Liver Function Tests:  Recent Labs  06/18/15 1635 06/20/15 0446 11/06/15 2358  AST 50* 22 29  ALT 18 14* 18  ALKPHOS 51 39 69  BILITOT 0.6 0.1* 0.5  PROT 7.6 5.4* 7.5  ALBUMIN 3.9 2.5* 4.0    Recent Labs  01/04/15 1551 04/06/15 1745  LIPASE 35 41   No results for input(s): AMMONIA in the last 8760 hours. CBC:  Recent Labs  04/06/15 1745  06/18/15 1635  11/06/15 2358  11/07/15 0400 11/08/15 0422 11/09/15 0354  WBC 11.7*  < > 4.7  < > 10.0  --  9.8 6.3 5.7  NEUTROABS 9.4*  --  2.3  --  7.3  --   --   --   --   HGB 11.2*  < > 13.1  < > 13.3  < > 13.8 12.4* 11.2*  HCT 34.4*  < > 39.5  < > 38.2*  < > 41.3 36.6* 33.2*  MCV 90.5  < > 94.5  < > 89.7  --  91.4 91.0 91.5  PLT 398  < > 357  < > 327  --  348 331 318  < > = values in this interval not displayed. Cardiac Enzymes: No results for input(s): CKTOTAL, CKMB, CKMBINDEX, TROPONINI in the last 8760 hours. BNP: Invalid input(s): POCBNP Lab Results  Component Value Date   HGBA1C 9.5 07/26/2015   Lab Results  Component Value Date   TSH 1.241 06/21/2015   No results found for: VITAMINB12 No results found for: FOLATE No results found for: IRON, TIBC, FERRITIN  Imaging and Procedures  obtained prior to SNF admission: Dg Chest 2 View  11/07/2015  CLINICAL DATA:  recurrent UTIs. Patient presents to the ED with c/o hyperglycemia. This is accompanied by generalized weakness. Despite being on chronic daily keflex to prevent UTI he also has had urinary frequency today.There also intermittent diarrhea over the past few months since starting keflex. Hx asthma. Copd. Hx htn. EXAM: CHEST  2 VIEW COMPARISON:  11/07/2015 at 0043 hours FINDINGS: Cardiac silhouette is normal in size and configuration. No mediastinal or hilar masses or convincing adenopathy. There is upper lobe parenchymal scarring with areas of pleural thickening, without change from the earlier study allowing for differences in patient positioning. There is no evidence of pneumonia or pulmonary edema. No pleural effusion or pneumothorax. Bony thorax is  demineralized but grossly intact. IMPRESSION: 1. No acute cardiopulmonary disease. No change from the earlier exam. 2. COPD with upper lobe scarring. Electronically Signed   By: Amie Portlandavid  Ormond M.D.   On: 11/07/2015 10:41   Dg Chest 2 View  11/07/2015  CLINICAL DATA:  Elevated blood sugar. Weakness and shortness of breath. EXAM: CHEST  2 VIEW COMPARISON:  09/17/2015 FINDINGS: Mild emphysematous changes in the lungs. Central interstitial pattern with bronchial thickening suggesting bronchitis. Streaky infiltrates in the right upper lung may represent early bronchopneumonia. No blunting of costophrenic angles. No pneumothorax. Mediastinal contours appear intact. Normal heart size and pulmonary vascularity. Degenerative changes in the spine. Calcification of the aorta. IMPRESSION: Emphysematous changes with bronchial wall thickening an perihilar interstitial changes suggesting acute or chronic bronchitis. Possible early infiltration in the right upper lung. Electronically Signed   By: Burman NievesWilliam  Stevens M.D.   On: 11/07/2015 01:03    Assessment/Plan 1. UTI (lower urinary tract  infection) -finishing up cefpodoxime today and has an additional day of fluconazole for the yeast portion -he is starting to feel a bit better -encourage hydration  2. Acute renal failure superimposed on stage 4 chronic kidney disease (HCC) -baseline 1.7 -will f/u bmp  3. Fall, initial encounter -with abrasions from carpet on his back -here for rehab due to PT eval recommending therapy -PT ordered today -see hpi  4. Generalized weakness -here for therapy and strengthening -has h/o severe protein calorie malnutrition  5. Type 2 diabetes mellitus with stage 4 chronic kidney disease, with long-term current use of insulin (HCC) -cbgs have improved here -mostly in the 100s now through the day and 200 in evening/hs; was 71 this am -cont toujeo and mealtime insulin per formulary -[facility] chart says he has type 1 diabetes but epic says type 2  6. COPD GOLD III  -cont dulera, albuterol inhaler prn, albuterol nebs prn  7. Bladder outlet obstruction -cont straight caths bid   Family/ staff Communication: discussed with SNF nursing  Labs/tests ordered:  F/u cbc, bmp, PT

## 2015-11-20 LAB — CBC AND DIFFERENTIAL
HCT: 40 % — AB (ref 41–53)
HEMOGLOBIN: 12.3 g/dL — AB (ref 13.5–17.5)
PLATELETS: 551 10*3/uL — AB (ref 150–399)
WBC: 13.7 10*3/mL

## 2015-11-20 LAB — BASIC METABOLIC PANEL
BUN: 40 mg/dL — AB (ref 4–21)
Creatinine: 2.1 mg/dL — AB (ref 0.6–1.3)
GLUCOSE: 48 mg/dL
POTASSIUM: 4.9 mmol/L (ref 3.4–5.3)
Sodium: 135 mmol/L — AB (ref 137–147)

## 2015-11-21 ENCOUNTER — Non-Acute Institutional Stay (SKILLED_NURSING_FACILITY): Payer: Medicare Other | Admitting: Adult Health

## 2015-11-21 ENCOUNTER — Encounter: Payer: Self-pay | Admitting: Adult Health

## 2015-11-21 DIAGNOSIS — N184 Chronic kidney disease, stage 4 (severe): Secondary | ICD-10-CM

## 2015-11-21 DIAGNOSIS — D72829 Elevated white blood cell count, unspecified: Secondary | ICD-10-CM | POA: Diagnosis not present

## 2015-11-21 DIAGNOSIS — K625 Hemorrhage of anus and rectum: Secondary | ICD-10-CM | POA: Diagnosis not present

## 2015-11-21 DIAGNOSIS — Z794 Long term (current) use of insulin: Secondary | ICD-10-CM

## 2015-11-21 DIAGNOSIS — N390 Urinary tract infection, site not specified: Secondary | ICD-10-CM

## 2015-11-21 DIAGNOSIS — E1122 Type 2 diabetes mellitus with diabetic chronic kidney disease: Secondary | ICD-10-CM

## 2015-11-21 NOTE — Progress Notes (Signed)
Patient ID: Benjamin Riddle, male   DOB: 30-Oct-1927, 82 y.o.   MRN: [id number]  [facility]  [facility]   Place of Service:  SNF (31) Provider:   Peggye Leyhristy Kelsey Durflinger, ANP [facility] [phone]   Ezequiel KayserPERINI,MARK A, MD  Patient Care Team: Rodrigo RanMark Perini, MD as PCP - General (Internal Medicine) Barron Alvineavid Grapey, MD as Consulting Physician (Urology) Hali MarryMichael Altheimer, MD as Consulting Physician (Endocrinology) Jethro BolusMark Shapiro, MD as Consulting Physician (Ophthalmology) Cammy CopaScott Gregory Dean, MD as Consulting Physician (Orthopedic Surgery) Sharrell KuJeffrey Medoff, MD as Consulting Physician (Gastroenterology) Peter M SwazilandJordan, MD as Consulting Physician (Cardiology) Merwyn Katosavid B Simonds, MD as Consulting Physician (Pulmonary Disease)  Extended Emergency Contact Information Primary Emergency Contact: Groot,Ingrid Address: [address]          [place] [id number] [place] Home Phone: [phone] Mobile Phone: [phone] Relation: Spouse Secondary Emergency Contact: Dierdre Highmanurcot,Sharon  [place] Mobile Phone: [phone] Relation: Friend  Code Status:  Full code   Chief Complaint  Patient presents with  . Acute Visit    elevated WBC, low cbg    HPI:  Pt is a 82 y.o. male seen today for an acute visit for low CBG recorded on lab work of 48 at noon on 5/3, as well as rectal bleeding, and elevated WBC.  [facility] after a UTI with weakness and elevated cbgs. He is here for physical therapy and is doing well. He had an episode of rectal bleeding yesterday and has a hx of hemorrhoids. A stat CBC was obtained with Hgb of 12.3 which was improved from the last numbers. He received one dose of anusol and reports that there have been no further episodes of bleeding or rectal pain.   Incidentally his WBC was noted to be 13.7.  He has not had any abd pain, dysuria, fever, cough, etc.  He currently self catheterizes due to neurogenic bladder. He  was treated with rocephin for his UTI in the hospital and later vantin. The culture was negative but this was believed to be due to the fact that the antibiotic was given before the culture was taken. He is a long term diabetic who manages his own insulin, he is also a physician. He has been known to reuse syringes and lancets.  His blood sugars are very labile, running low at times and other times running over 400.  Past Medical History  Diagnosis Date  . Diabetes mellitus   . Asthma   . Hydronephrosis   . Kidney disease     Stage III chronic kidney disease  . Hypertension   . Neurogenic bladder   . Osteoporosis   . Hypogonadism male   . Recurrent urinary tract infection     With resistent Pseudomonas  . Allergic rhinitis   . Hepatitis C     due to blood transfusion  . Hyperlipidemia   . SOB (shortness of breath)   . CKD (chronic kidney disease), stage III   . Combined systolic and diastolic congestive heart failure (HCC)   . COPD (chronic obstructive pulmonary disease) (HCC)   . Allergy   . Cataract   . Heart murmur    Past Surgical History  Procedure Laterality Date  . Hernia repair    . Tonsillectomy    . Bladder repair      obstruction surgery  . Frontal sinus obliteration      sinus repaired with a plate  . Cataract extraction  2015  . Eye  surgery    . Fracture surgery    . Prostate surgery    . Spine surgery      Allergies  Allergen Reactions  . Aspirin   . Bactrim [Sulfamethoxazole-Trimethoprim]   . Codeine Nausea And Vomiting  . Gentamicin     Becomes dizzy and weak  . Lexapro [Escitalopram Oxalate]   . Protonix [Pantoprazole Sodium]   . Ciprofloxacin Itching and Rash      Medication List       This list is accurate as of: 11/21/15 12:00 PM.  Always use your most recent med list.               albuterol (2.5 MG/3ML) 0.083% NEBU 3 mL, albuterol (5 MG/ML) 0.5% NEBU 0.5 mL  Inhale 5 mg into the lungs every 6 (six) hours as needed (wheezing).      albuterol 90 MCG/ACT inhaler  Commonly known as:  PROVENTIL,VENTOLIN  Inhale 2 puffs into the lungs every 4 (four) hours as needed for wheezing or shortness of breath.     bismuth subsalicylate 262 MG/15ML suspension  Commonly known as:  PEPTO BISMOL  Take 30 mLs by mouth every 6 (six) hours as needed for indigestion or diarrhea or loose stools.     cholecalciferol 1000 units tablet  Commonly known as:  VITAMIN D  Take 1,000 Units by mouth daily.     DULERA 100-5 MCG/ACT Aero  Generic drug:  mometasone-formoterol  Inhale 2 puffs into the lungs 2 (two) times daily.     insulin lispro 100 UNIT/ML injection  Commonly known as:  HUMALOG  Inject 6-8 Units into the skin 3 (three) times daily before meals. 6 units at breakfast, 4 units at lunch,  8 units at supper     saccharomyces boulardii 250 MG capsule  Commonly known as:  FLORASTOR  Take 1 capsule (250 mg total) by mouth 2 (two) times daily.     simvastatin 40 MG tablet  Commonly known as:  ZOCOR  Take 40 mg by mouth daily.     TOUJEO SOLOSTAR 300 UNIT/ML Sopn  Generic drug:  Insulin Glargine  Inject 12 Units into the skin at bedtime.     zolpidem 5 MG tablet  Commonly known as:  AMBIEN  Take 1 tablet (5 mg total) by mouth at bedtime as needed for sleep.        Review of Systems  Constitutional: Negative for fever, chills, diaphoresis, activity change, appetite change and fatigue.  HENT: Negative for congestion and postnasal drip.   Respiratory: Negative for cough and shortness of breath.   Cardiovascular: Negative for chest pain, palpitations and leg swelling.  Gastrointestinal: Negative for nausea, vomiting, abdominal pain, diarrhea, constipation, blood in stool, abdominal distention and rectal pain.  Genitourinary: Negative for dysuria, frequency, hematuria, flank pain, discharge and difficulty urinating.       Self catheterizes  Musculoskeletal: Positive for gait problem (uses walker).  Neurological: Negative for  dizziness, tremors, seizures, syncope, facial asymmetry, speech difficulty, weakness, light-headedness, numbness and headaches.  Psychiatric/Behavioral: Negative for behavioral problems, confusion and agitation.    Immunization History  Administered Date(s) Administered  . Influenza Split 04/19/2014  . Influenza-Unspecified 06/28/2015  . Pneumococcal Conjugate-13 05/24/2013, 07/20/2014  . Pneumococcal Polysaccharide-23 07/28/2011  . Td 07/28/2011  . Zoster 06/21/2012   Pertinent  Health Maintenance Due  Topic Date Due  . FOOT EXAM  06/18/1940  . HEMOGLOBIN A1C  01/23/2016  . OPHTHALMOLOGY EXAM  01/28/2016  . INFLUENZA VACCINE  02/18/2016  .  URINE MICROALBUMIN  07/25/2016  . PNA vac Low Risk Adult  Completed   Fall Risk  04/23/2015  Falls in the past year? Yes  Number falls in past yr: 1   Functional Status Survey:    Filed Vitals:   11/21/15 1150  BP: 89/50  Pulse: 61  Temp: 97.7 F (36.5 C)  Resp: 17   There is no weight on file to calculate BMI. Physical Exam  Constitutional: He is oriented to person, place, and time. No distress.  Cardiovascular: Normal rate and regular rhythm.   No murmur heard. Pulmonary/Chest: Effort normal and breath sounds normal.  Abdominal: Soft. Bowel sounds are normal. He exhibits no distension.  No s/p or cva tenderness  Genitourinary:  Declined rectal exam  Neurological: He is oriented to person, place, and time.  Skin: Skin is warm and dry. He is not diaphoretic.  Psychiatric:  flat    Labs reviewed:  Recent Labs  01/23/15 1653  11/07/15 0400 11/08/15 0422 11/09/15 0354 11/20/15  NA 136  < > 141 143 140 135*  K 4.9  < > 5.1 4.3 4.6 4.9  CL 105  < > 107 107 109  --   CO2 20*  < > 22 23 22   --   GLUCOSE 474*  < > 84 214* 203*  --   BUN 50*  < > 53* 44* 34* 40*  CREATININE 2.07*  < > 2.21* 1.97* 1.75* 2.1*  CALCIUM 8.1*  < > 9.2 9.0 8.7*  --   MG 2.1  --   --   --   --   --   PHOS 3.3  --   --   --   --   --   < > =  values in this interval not displayed.  Recent Labs  06/18/15 1635 06/20/15 0446 11/06/15 2358  AST 50* 22 29  ALT 18 14* 18  ALKPHOS 51 39 69  BILITOT 0.6 0.1* 0.5  PROT 7.6 5.4* 7.5  ALBUMIN 3.9 2.5* 4.0    Recent Labs  04/06/15 1745  06/18/15 1635  11/06/15 2358  11/07/15 0400 11/08/15 0422 11/09/15 0354 11/20/15  WBC 11.7*  < > 4.7  < > 10.0  --  9.8 6.3 5.7 13.7  NEUTROABS 9.4*  --  2.3  --  7.3  --   --   --   --   --   HGB 11.2*  < > 13.1  < > 13.3  < > 13.8 12.4* 11.2* 12.3*  HCT 34.4*  < > 39.5  < > 38.2*  < > 41.3 36.6* 33.2* 40*  MCV 90.5  < > 94.5  < > 89.7  --  91.4 91.0 91.5  --   PLT 398  < > 357  < > 327  --  348 331 318 551*  < > = values in this interval not displayed. Lab Results  Component Value Date   TSH 1.241 06/21/2015   Lab Results  Component Value Date   HGBA1C 9.5 07/26/2015   Lab Results  Component Value Date   CHOL 137 01/05/2015   HDL 48 01/05/2015   LDLCALC 59 01/05/2015   TRIG 148 01/05/2015   CHOLHDL 2.9 01/05/2015    Significant Diagnostic Results in last 30 days:  Dg Chest 2 View  11/07/2015  CLINICAL DATA:  recurrent UTIs. Patient presents to the ED with c/o hyperglycemia. This is accompanied by generalized weakness. Despite being on chronic daily keflex to prevent  UTI he also has had urinary frequency today.There also intermittent diarrhea over the past few months since starting keflex. Hx asthma. Copd. Hx htn. EXAM: CHEST  2 VIEW COMPARISON:  11/07/2015 at 0043 hours FINDINGS: Cardiac silhouette is normal in size and configuration. No mediastinal or hilar masses or convincing adenopathy. There is upper lobe parenchymal scarring with areas of pleural thickening, without change from the earlier study allowing for differences in patient positioning. There is no evidence of pneumonia or pulmonary edema. No pleural effusion or pneumothorax. Bony thorax is demineralized but grossly intact. IMPRESSION: 1. No acute cardiopulmonary  disease. No change from the earlier exam. 2. COPD with upper lobe scarring. Electronically Signed   By: Amie Portland M.D.   On: 11/07/2015 10:41   Dg Chest 2 View  11/07/2015  CLINICAL DATA:  Elevated blood sugar. Weakness and shortness of breath. EXAM: CHEST  2 VIEW COMPARISON:  09/17/2015 FINDINGS: Mild emphysematous changes in the lungs. Central interstitial pattern with bronchial thickening suggesting bronchitis. Streaky infiltrates in the right upper lung may represent early bronchopneumonia. No blunting of costophrenic angles. No pneumothorax. Mediastinal contours appear intact. Normal heart size and pulmonary vascularity. Degenerative changes in the spine. Calcification of the aorta. IMPRESSION: Emphysematous changes with bronchial wall thickening an perihilar interstitial changes suggesting acute or chronic bronchitis. Possible early infiltration in the right upper lung. Electronically Signed   By: Burman Nieves M.D.   On: 11/07/2015 01:03    Assessment/Plan  1. 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 Chronic UTI -no symptoms -He was previously on Keflex daily for prevention but this was stopped I believe due to his recent treatment of a UTI. Keflex gave him diarrhea at times, will try ceftin 250 mg qd (has multiple allergies)  3. Leukocytosis Noted on CBC which was taken due to rectal bleeding.  Recheck on Monday prior to d/c (5/8)  4. Rectal bleeding -resolved, H/H improved -has hx of hemorrhoids, anusol ordered prn which was given and seemed to help   Family/  staff Communication: discussed with staff and resident  Labs/tests ordered:  CBC  Peggye Ley, ANP [facility] [phone]

## 2015-11-26 NOTE — Progress Notes (Signed)
PT evaluation G Codes    11/08/15 1013  PT Time Calculation  PT Start Time (ACUTE ONLY) 0946  PT Stop Time (ACUTE ONLY) 1001  PT Time Calculation (min) (ACUTE ONLY) 15 min  PT G-Codes **NOT FOR INPATIENT CLASS**  Functional Assessment Tool Used clinical judgement  Functional Limitation Mobility: Walking and moving around  Mobility: Walking and Moving Around Current Status (N8295(G8978) CJ  Mobility: Walking and Moving Around Goal Status (A2130(G8979) CI  PT General Charges  $$ ACUTE PT VISIT 1 Procedure  PT Evaluation  $PT Eval Moderate Complexity 1 Procedure

## 2015-12-16 ENCOUNTER — Encounter (HOSPITAL_COMMUNITY): Payer: Self-pay | Admitting: Emergency Medicine

## 2015-12-16 ENCOUNTER — Inpatient Hospital Stay (HOSPITAL_COMMUNITY): Payer: Medicare Other

## 2015-12-16 ENCOUNTER — Inpatient Hospital Stay (HOSPITAL_COMMUNITY)
Admission: EM | Admit: 2015-12-16 | Discharge: 2015-12-20 | DRG: 637 | Disposition: A | Discharge status: Home / Self Care | Payer: Medicare Other | Attending: Internal Medicine | Admitting: Internal Medicine

## 2015-12-16 DIAGNOSIS — Z8249 Family history of ischemic heart disease and other diseases of the circulatory system: Secondary | ICD-10-CM | POA: Diagnosis not present

## 2015-12-16 DIAGNOSIS — Z825 Family history of asthma and other chronic lower respiratory diseases: Secondary | ICD-10-CM | POA: Diagnosis not present

## 2015-12-16 DIAGNOSIS — Z881 Allergy status to other antibiotic agents status: Secondary | ICD-10-CM

## 2015-12-16 DIAGNOSIS — I129 Hypertensive chronic kidney disease with stage 1 through stage 4 chronic kidney disease, or unspecified chronic kidney disease: Secondary | ICD-10-CM | POA: Diagnosis present

## 2015-12-16 DIAGNOSIS — Z0189 Encounter for other specified special examinations: Secondary | ICD-10-CM

## 2015-12-16 DIAGNOSIS — N319 Neuromuscular dysfunction of bladder, unspecified: Secondary | ICD-10-CM | POA: Diagnosis present

## 2015-12-16 DIAGNOSIS — N183 Chronic kidney disease, stage 3 (moderate): Secondary | ICD-10-CM | POA: Diagnosis present

## 2015-12-16 DIAGNOSIS — Z888 Allergy status to other drugs, medicaments and biological substances status: Secondary | ICD-10-CM | POA: Diagnosis not present

## 2015-12-16 DIAGNOSIS — E1022 Type 1 diabetes mellitus with diabetic chronic kidney disease: Secondary | ICD-10-CM | POA: Diagnosis present

## 2015-12-16 DIAGNOSIS — Z7951 Long term (current) use of inhaled steroids: Secondary | ICD-10-CM

## 2015-12-16 DIAGNOSIS — R55 Syncope and collapse: Secondary | ICD-10-CM | POA: Diagnosis present

## 2015-12-16 DIAGNOSIS — E10649 Type 1 diabetes mellitus with hypoglycemia without coma: Secondary | ICD-10-CM | POA: Diagnosis present

## 2015-12-16 DIAGNOSIS — Z79899 Other long term (current) drug therapy: Secondary | ICD-10-CM | POA: Diagnosis not present

## 2015-12-16 DIAGNOSIS — E875 Hyperkalemia: Secondary | ICD-10-CM | POA: Diagnosis present

## 2015-12-16 DIAGNOSIS — E1310 Other specified diabetes mellitus with ketoacidosis without coma: Secondary | ICD-10-CM | POA: Diagnosis not present

## 2015-12-16 DIAGNOSIS — J449 Chronic obstructive pulmonary disease, unspecified: Secondary | ICD-10-CM | POA: Diagnosis present

## 2015-12-16 DIAGNOSIS — Z885 Allergy status to narcotic agent status: Secondary | ICD-10-CM

## 2015-12-16 DIAGNOSIS — E1110 Type 2 diabetes mellitus with ketoacidosis without coma: Secondary | ICD-10-CM | POA: Diagnosis present

## 2015-12-16 DIAGNOSIS — E1010 Type 1 diabetes mellitus with ketoacidosis without coma: Secondary | ICD-10-CM | POA: Diagnosis present

## 2015-12-16 DIAGNOSIS — I5040 Unspecified combined systolic (congestive) and diastolic (congestive) heart failure: Secondary | ICD-10-CM | POA: Diagnosis present

## 2015-12-16 DIAGNOSIS — Z681 Body mass index (BMI) 19 or less, adult: Secondary | ICD-10-CM

## 2015-12-16 DIAGNOSIS — I130 Hypertensive heart and chronic kidney disease with heart failure and stage 1 through stage 4 chronic kidney disease, or unspecified chronic kidney disease: Secondary | ICD-10-CM | POA: Diagnosis present

## 2015-12-16 DIAGNOSIS — Z809 Family history of malignant neoplasm, unspecified: Secondary | ICD-10-CM

## 2015-12-16 DIAGNOSIS — Z833 Family history of diabetes mellitus: Secondary | ICD-10-CM | POA: Diagnosis not present

## 2015-12-16 DIAGNOSIS — Z87891 Personal history of nicotine dependence: Secondary | ICD-10-CM | POA: Diagnosis not present

## 2015-12-16 DIAGNOSIS — Z7982 Long term (current) use of aspirin: Secondary | ICD-10-CM

## 2015-12-16 DIAGNOSIS — N390 Urinary tract infection, site not specified: Secondary | ICD-10-CM | POA: Diagnosis not present

## 2015-12-16 DIAGNOSIS — E43 Unspecified severe protein-calorie malnutrition: Secondary | ICD-10-CM | POA: Diagnosis present

## 2015-12-16 LAB — BLOOD GAS, VENOUS
Acid-base deficit: 19 mmol/L — ABNORMAL HIGH (ref 0.0–2.0)
Bicarbonate: 9.6 mEq/L — ABNORMAL LOW (ref 20.0–24.0)
O2 Saturation: 59.2 %
PCO2 VEN: 32.1 mmHg — AB (ref 45.0–50.0)
PH VEN: 7.104 — AB (ref 7.250–7.300)
PO2 VEN: 37.5 mmHg (ref 31.0–45.0)
Patient temperature: 98.6
TCO2: 9.5 mmol/L (ref 0–100)

## 2015-12-16 LAB — CBC
HEMATOCRIT: 38.5 % — AB (ref 39.0–52.0)
HEMOGLOBIN: 11.8 g/dL — AB (ref 13.0–17.0)
MCH: 30.9 pg (ref 26.0–34.0)
MCHC: 30.6 g/dL (ref 30.0–36.0)
MCV: 100.8 fL — ABNORMAL HIGH (ref 78.0–100.0)
Platelets: 358 10*3/uL (ref 150–400)
RBC: 3.82 MIL/uL — ABNORMAL LOW (ref 4.22–5.81)
RDW: 14.7 % (ref 11.5–15.5)
WBC: 14.5 10*3/uL — AB (ref 4.0–10.5)

## 2015-12-16 LAB — BASIC METABOLIC PANEL
ANION GAP: 23 — AB (ref 5–15)
ANION GAP: 14 (ref 5–15)
ANION GAP: 8 (ref 5–15)
ANION GAP: 11 (ref 5–15)
BUN: 70 mg/dL — AB (ref 6–20)
BUN: 60 mg/dL — ABNORMAL HIGH (ref 6–20)
BUN: 58 mg/dL — ABNORMAL HIGH (ref 6–20)
BUN: 55 mg/dL — ABNORMAL HIGH (ref 6–20)
CALCIUM: 8.7 mg/dL — AB (ref 8.9–10.3)
CALCIUM: 8.4 mg/dL — AB (ref 8.9–10.3)
CALCIUM: 8.3 mg/dL — AB (ref 8.9–10.3)
CALCIUM: 8.2 mg/dL — AB (ref 8.9–10.3)
CO2: 9 mmol/L — ABNORMAL LOW (ref 22–32)
CO2: 15 mmol/L — ABNORMAL LOW (ref 22–32)
CO2: 21 mmol/L — ABNORMAL LOW (ref 22–32)
CO2: 19 mmol/L — ABNORMAL LOW (ref 22–32)
CREATININE: 2.47 mg/dL — AB (ref 0.61–1.24)
Chloride: 97 mmol/L — ABNORMAL LOW (ref 101–111)
Chloride: 106 mmol/L (ref 101–111)
Chloride: 107 mmol/L (ref 101–111)
Chloride: 107 mmol/L (ref 101–111)
Creatinine, Ser: 3.43 mg/dL — ABNORMAL HIGH (ref 0.61–1.24)
Creatinine, Ser: 2.88 mg/dL — ABNORMAL HIGH (ref 0.61–1.24)
Creatinine, Ser: 2.43 mg/dL — ABNORMAL HIGH (ref 0.61–1.24)
GFR calc Af Amer: 17 mL/min — ABNORMAL LOW (ref >60)
GFR calc Af Amer: 26 mL/min — ABNORMAL LOW (ref >60)
GFR, EST AFRICAN AMERICAN: 21 mL/min — AB (ref >60)
GFR, EST AFRICAN AMERICAN: 26 mL/min — AB (ref >60)
GFR, EST NON AFRICAN AMERICAN: 15 mL/min — AB (ref >60)
GFR, EST NON AFRICAN AMERICAN: 18 mL/min — AB (ref >60)
GFR, EST NON AFRICAN AMERICAN: 23 mL/min — AB (ref >60)
GFR, EST NON AFRICAN AMERICAN: 22 mL/min — AB (ref >60)
GLUCOSE: 122 mg/dL — AB (ref 65–99)
GLUCOSE: 229 mg/dL — AB (ref 65–99)
Glucose, Bld: 794 mg/dL (ref 65–99)
Glucose, Bld: 389 mg/dL — ABNORMAL HIGH (ref 65–99)
POTASSIUM: 5.2 mmol/L — AB (ref 3.5–5.1)
Potassium: 5.1 mmol/L (ref 3.5–5.1)
Potassium: 3.9 mmol/L (ref 3.5–5.1)
Potassium: 4.5 mmol/L (ref 3.5–5.1)
SODIUM: 129 mmol/L — AB (ref 135–145)
Sodium: 135 mmol/L (ref 135–145)
Sodium: 136 mmol/L (ref 135–145)
Sodium: 137 mmol/L (ref 135–145)

## 2015-12-16 LAB — URINALYSIS, ROUTINE W REFLEX MICROSCOPIC
BILIRUBIN URINE: NEGATIVE
Glucose, UA: >1000 mg/dL — AB
KETONES UR: 15 mg/dL — AB
NITRITE: NEGATIVE
PH: 6 (ref 5.0–8.0)
Protein, ur: 100 mg/dL — AB
Specific Gravity, Urine: 1.014 (ref 1.005–1.030)

## 2015-12-16 LAB — GLUCOSE, CAPILLARY
GLUCOSE-CAPILLARY: 169 mg/dL — AB (ref 65–99)
GLUCOSE-CAPILLARY: 196 mg/dL — AB (ref 65–99)
GLUCOSE-CAPILLARY: 247 mg/dL — AB (ref 65–99)
Glucose-Capillary: 404 mg/dL — ABNORMAL HIGH (ref 65–99)
Glucose-Capillary: 302 mg/dL — ABNORMAL HIGH (ref 65–99)
Glucose-Capillary: 129 mg/dL — ABNORMAL HIGH (ref 65–99)
Glucose-Capillary: 95 mg/dL (ref 65–99)
Glucose-Capillary: 116 mg/dL — ABNORMAL HIGH (ref 65–99)
Glucose-Capillary: 226 mg/dL — ABNORMAL HIGH (ref 65–99)

## 2015-12-16 LAB — URINE MICROSCOPIC-ADD ON

## 2015-12-16 LAB — CBG MONITORING, ED
Glucose-Capillary: 510 mg/dL — ABNORMAL HIGH (ref 65–99)
Glucose-Capillary: 457 mg/dL — ABNORMAL HIGH (ref 65–99)

## 2015-12-16 LAB — MRSA PCR SCREENING: MRSA by PCR: NEGATIVE

## 2015-12-16 MED ORDER — SODIUM CHLORIDE 0.9 % IV BOLUS (SEPSIS)
2000.0000 mL | Freq: Once | INTRAVENOUS | Status: AC
  Administered 2015-12-16: 2000 mL via INTRAVENOUS

## 2015-12-16 MED ORDER — INSULIN REGULAR HUMAN 100 UNIT/ML IJ SOLN
INTRAMUSCULAR | Status: DC
  Filled 2015-12-16: qty 2.5

## 2015-12-16 MED ORDER — ALBUTEROL SULFATE (2.5 MG/3ML) 0.083% IN NEBU: 2.5000 mg | INHALATION_SOLUTION | Freq: Four times a day (QID) | RESPIRATORY_TRACT | Status: DC | PRN

## 2015-12-16 MED ORDER — DEXTROSE 5 % IV SOLN
1.0000 g | INTRAVENOUS | Status: DC
  Administered 2015-12-17 – 2015-12-19 (×3): 1 g via INTRAVENOUS
  Filled 2015-12-16 (×4): qty 10

## 2015-12-16 MED ORDER — SIMVASTATIN 40 MG PO TABS
40.0000 mg | ORAL_TABLET | Freq: Every day | ORAL | Status: DC
  Administered 2015-12-16 – 2015-12-19 (×4): 40 mg via ORAL
  Filled 2015-12-16 (×5): qty 1

## 2015-12-16 MED ORDER — INSULIN GLARGINE 100 UNIT/ML ~~LOC~~ SOLN
10.0000 [IU] | Freq: Every day | SUBCUTANEOUS | Status: DC
  Administered 2015-12-16 – 2015-12-17 (×2): 10 [IU] via SUBCUTANEOUS
  Filled 2015-12-16 (×2): qty 0.1

## 2015-12-16 MED ORDER — SACCHAROMYCES BOULARDII 250 MG PO CAPS
250.0000 mg | ORAL_CAPSULE | Freq: Two times a day (BID) | ORAL | Status: DC
  Administered 2015-12-16 – 2015-12-19 (×7): 250 mg via ORAL
  Filled 2015-12-16 (×9): qty 1

## 2015-12-16 MED ORDER — DEXTROSE-NACL 5-0.45 % IV SOLN
INTRAVENOUS | Status: DC
  Administered 2015-12-16: 18:00:00 via INTRAVENOUS

## 2015-12-16 MED ORDER — ALBUTEROL SULFATE (5 MG/ML) 0.5% IN NEBU: 5.0000 mg | INHALATION_SOLUTION | Freq: Four times a day (QID) | RESPIRATORY_TRACT | Status: DC | PRN

## 2015-12-16 MED ORDER — SODIUM CHLORIDE 0.9 % IV SOLN
INTRAVENOUS | Status: AC
  Administered 2015-12-16 (×2): via INTRAVENOUS

## 2015-12-16 MED ORDER — MOMETASONE FURO-FORMOTEROL FUM 100-5 MCG/ACT IN AERO
2.0000 | INHALATION_SPRAY | Freq: Two times a day (BID) | RESPIRATORY_TRACT | Status: DC
  Administered 2015-12-16 – 2015-12-20 (×8): 2 via RESPIRATORY_TRACT
  Filled 2015-12-16: qty 8.8

## 2015-12-16 MED ORDER — VITAMIN D3 25 MCG (1000 UNIT) PO TABS
1000.0000 [IU] | ORAL_TABLET | Freq: Every day | ORAL | Status: DC
  Administered 2015-12-16 – 2015-12-19 (×4): 1000 [IU] via ORAL
  Filled 2015-12-16 (×5): qty 1

## 2015-12-16 MED ORDER — DEXTROSE-NACL 5-0.45 % IV SOLN: INTRAVENOUS | Status: DC

## 2015-12-16 MED ORDER — DEXTROSE 5 % IV SOLN
1.0000 g | Freq: Once | INTRAVENOUS | Status: AC
  Administered 2015-12-16: 1 g via INTRAVENOUS
  Filled 2015-12-16: qty 10

## 2015-12-16 MED ORDER — SODIUM CHLORIDE 0.9 % IV SOLN: INTRAVENOUS | Status: DC

## 2015-12-16 MED ORDER — HEPARIN SODIUM (PORCINE) 5000 UNIT/ML IJ SOLN
5000.0000 [IU] | Freq: Three times a day (TID) | INTRAMUSCULAR | Status: DC
  Administered 2015-12-16 – 2015-12-20 (×10): 5000 [IU] via SUBCUTANEOUS
  Filled 2015-12-16 (×14): qty 1

## 2015-12-16 MED ORDER — BISMUTH SUBSALICYLATE 262 MG/15ML PO SUSP
30.0000 mL | Freq: Four times a day (QID) | ORAL | Status: DC | PRN
  Filled 2015-12-16: qty 118

## 2015-12-16 MED ORDER — SODIUM CHLORIDE 0.9 % IV SOLN
INTRAVENOUS | Status: DC
  Administered 2015-12-16: 5.4 [IU]/h via INTRAVENOUS
  Filled 2015-12-16: qty 2.5

## 2015-12-16 NOTE — ED Provider Notes (Signed)
CSN: [id number]     Arrival date & time 12/16/15  1035 History   First MD Initiated Contact with Patient 12/16/15 1058     Chief Complaint  Patient presents with  . Near Syncope  . Hyperglycemia     (Consider location/radiation/quality/duration/timing/severity/associated sxs/prior Treatment) Patient is a 82 y.o. male presenting with near-syncope and hyperglycemia. The history is provided by the patient.  Near Syncope This is a new problem. The current episode started 3 to 5 hours ago. The problem occurs rarely. The problem has been resolved. Pertinent negatives include no chest pain, no abdominal pain, no headaches and no shortness of breath. Nothing aggravates the symptoms. Nothing relieves the symptoms. He has tried nothing for the symptoms. The treatment provided no relief.  Hyperglycemia Associated symptoms: fatigue, nausea and vomiting   Associated symptoms: no abdominal pain, no chest pain, no confusion, no fever and no shortness of breath    82 yo M With a chief complaint of weakness. Going on for the past couple days. Has been having trouble controlling his blood sugars as well. Today felt so weak that he fell down. Denies focal weakness denies difficulty with speech denies head injury. Has not been compliant with his medications and feels like his blood sugars been elevated. Has a history of recurrent urinary tract infections but does not feel like he's had any symptoms with this.  Past Medical History  Diagnosis Date  . Diabetes mellitus   . Asthma   . Hydronephrosis   . Kidney disease     Stage III chronic kidney disease  . Hypertension   . Neurogenic bladder   . Osteoporosis   . Hypogonadism male   . Recurrent urinary tract infection     With resistent Pseudomonas  . Allergic rhinitis   . Hepatitis C     due to blood transfusion  . Hyperlipidemia   . SOB (shortness of breath)   . CKD (chronic kidney disease), stage III   . Combined systolic and diastolic congestive  heart failure (HCC)   . COPD (chronic obstructive pulmonary disease) (HCC)   . Allergy   . Cataract   . Heart murmur    Past Surgical History  Procedure Laterality Date  . Hernia repair    . Tonsillectomy    . Bladder repair      obstruction surgery  . Frontal sinus obliteration      sinus repaired with a plate  . Cataract extraction  2015  . Eye surgery    . Fracture surgery    . Prostate surgery    . Spine surgery     Family History  Problem Relation Age of Onset  . Heart failure Mother   . Hypertension Mother   . COPD Father     smoked  . Diabetes Father   . Cancer Sister     Breast  . Diabetes Sister   . COPD Brother     smoked   Social History  Substance Use Topics  . Smoking status: Former Smoker    Types: Cigars, Cigarettes    Quit date: 07/21/2011  . Smokeless tobacco: Never Used     Comment: smoked occ cigar and pipe  . Alcohol Use: No    Review of Systems  Constitutional: Positive for fatigue. Negative for fever and chills.  HENT: Negative for congestion and facial swelling.   Eyes: Negative for discharge and visual disturbance.  Respiratory: Negative for shortness of breath.   Cardiovascular: Positive for near-syncope. Negative  for chest pain and palpitations.  Gastrointestinal: Positive for nausea and vomiting. Negative for abdominal pain and diarrhea.  Musculoskeletal: Negative for myalgias and arthralgias.  Skin: Negative for color change and rash.  Neurological: Positive for light-headedness. Negative for tremors, syncope and headaches.  Psychiatric/Behavioral: Negative for confusion and dysphoric mood.      Allergies  Aspirin; Bactrim; Codeine; Gentamicin; Lexapro; Protonix; and Ciprofloxacin  Home Medications   Prior to Admission medications   Medication Sig Start Date End Date Taking? Authorizing Provider  albuterol (2.5 MG/3ML) 0.083% NEBU 3 mL, albuterol (5 MG/ML) 0.5% NEBU 0.5 mL Inhale 5 mg into the lungs every 6 (six) hours as  needed (wheezing).   Yes Historical Provider, MD  albuterol (PROVENTIL,VENTOLIN) 90 MCG/ACT inhaler Inhale 2 puffs into the lungs every 4 (four) hours as needed for wheezing or shortness of breath.    Yes Historical Provider, MD  bismuth subsalicylate (PEPTO BISMOL) 262 MG/15ML suspension Take 30 mLs by mouth every 6 (six) hours as needed for indigestion or diarrhea or loose stools.   Yes Historical Provider, MD  cefUROXime (CEFTIN) 250 MG tablet Take 250 mg by mouth daily.   Yes Historical Provider, MD  cholecalciferol (VITAMIN D) 1000 UNITS tablet Take 1,000 Units by mouth daily.   Yes Historical Provider, MD  Insulin Glargine (TOUJEO SOLOSTAR) 300 UNIT/ML SOPN Inject 12 Units into the skin at bedtime.   Yes Historical Provider, MD  insulin lispro (HUMALOG) 100 UNIT/ML injection Inject 4-8 Units into the skin 3 (three) times daily before meals. 8 units at breakfast, 4 units at lunch,  8 units at supper   Yes Historical Provider, MD  mometasone-formoterol (DULERA) 100-5 MCG/ACT AERO Inhale 2 puffs into the lungs 2 (two) times daily.   Yes Historical Provider, MD  saccharomyces boulardii (FLORASTOR) 250 MG capsule Take 1 capsule (250 mg total) by mouth 2 (two) times daily. 01/26/15  Yes Alison Murray, MD  simvastatin (ZOCOR) 40 MG tablet Take 40 mg by mouth daily.     Yes Historical Provider, MD  zolpidem (AMBIEN) 5 MG tablet Take 1 tablet (5 mg total) by mouth at bedtime as needed for sleep. Patient not taking: Reported on 12/16/2015 06/22/15   Albertine Grates, MD   BP 145/83 mmHg  Pulse 83  Temp(Src) 96.4 F (35.8 C) (Rectal)  Resp 24  SpO2 100% Physical Exam  Constitutional: He is oriented to person, place, and time.  Chronically ill appearing  HENT:  Head: Normocephalic and atraumatic.  Eyes: EOM are normal. Pupils are equal, round, and reactive to light.  Neck: Normal range of motion. Neck supple. No JVD present.  Cardiovascular: Regular rhythm.  Tachycardia present.  Exam reveals no gallop and no  friction rub.   No murmur heard. Pulmonary/Chest: No respiratory distress. He has no wheezes.  Abdominal: He exhibits no distension. There is no tenderness. There is no rebound and no guarding.  Musculoskeletal: Normal range of motion.  Neurological: He is alert and oriented to person, place, and time.  Skin: No rash noted. No pallor.  Psychiatric: He has a normal mood and affect. His behavior is normal.  Nursing note and vitals reviewed.   ED Course  Procedures (including critical care time) Labs Review Labs Reviewed  BASIC METABOLIC PANEL - Abnormal; Notable for the following:    Sodium 129 (*)    Potassium 5.2 (*)    Chloride 97 (*)    CO2 9 (*)    Glucose, Bld 794 (*)    BUN  70 (*)    Creatinine, Ser 3.43 (*)    Calcium 8.7 (*)    GFR calc non Af Amer 15 (*)    GFR calc Af Amer 17 (*)    Anion gap 23 (*)    All other components within normal limits  CBC - Abnormal; Notable for the following:    WBC 14.5 (*)    RBC 3.82 (*)    Hemoglobin 11.8 (*)    HCT 38.5 (*)    MCV 100.8 (*)    All other components within normal limits  URINALYSIS, ROUTINE W REFLEX MICROSCOPIC (NOT AT [facility]) - Abnormal; Notable for the following:    APPearance TURBID (*)    Glucose, UA >1000 (*)    Hgb urine dipstick MODERATE (*)    Ketones, ur 15 (*)    Protein, ur 100 (*)    Leukocytes, UA LARGE (*)    All other components within normal limits  BLOOD GAS, VENOUS - Abnormal; Notable for the following:    pH, Ven 7.104 (*)    pCO2, Ven 32.1 (*)    Bicarbonate 9.6 (*)    Acid-base deficit 19.0 (*)    All other components within normal limits  URINE MICROSCOPIC-ADD ON - Abnormal; Notable for the following:    Squamous Epithelial / LPF 0-5 (*)    Bacteria, UA MANY (*)    All other components within normal limits  CBG MONITORING, ED - Abnormal; Notable for the following:    Glucose-Capillary >600 (*)    All other components within normal limits  CBG MONITORING, ED - Abnormal; Notable for the  following:    Glucose-Capillary >600 (*)    All other components within normal limits  CBG MONITORING, ED - Abnormal; Notable for the following:    Glucose-Capillary 510 (*)    All other components within normal limits    Imaging Review No results found. I have personally reviewed and evaluated these images and lab results as part of my medical decision-making.   EKG Interpretation   Date/Time:  Monday Dec 16 2015 10:57:56 EDT Ventricular Rate:  94 PR Interval:  175 QRS Duration: 88 QT Interval:  360 QTC Calculation: 450 R Axis:   47 Text Interpretation:  Sinus rhythm No significant change since last  tracing Confirmed by Rosario Kushner MD, Reuel Boom ([id number]) on 12/16/2015 11:28:43 AM  Also confirmed by Adela Lank MD, DANIEL [phone]), editor Stout CT, Jola Babinski  [phone])  on 12/16/2015 2:00:04 PM      MDM   Final diagnoses:  DKA (diabetic ketoacidoses) (HCC)    82 yo M With a chief complaint of hyperglycemia and weakness. Patient found to be in DKA. Start on insulin drip. Will admit.  CRITICAL CARE Performed by: Rae Roam   Total critical care time: 30 minutes  Critical care time was exclusive of separately billable procedures and treating other patients.  Critical care was necessary to treat or prevent imminent or life-threatening deterioration.  Critical care was time spent personally by me on the following activities: development of treatment plan with patient and/or surrogate as well as nursing, discussions with consultants, evaluation of patient's response to treatment, examination of patient, obtaining history from patient or surrogate, ordering and performing treatments and interventions, ordering and review of laboratory studies, ordering and review of radiographic studies, pulse oximetry and re-evaluation of patient's condition.   The patients results and plan were reviewed and discussed.   Any x-rays performed were independently reviewed by myself.   Differential  diagnosis were considered with the presenting HPI.  Medications  dextrose 5 %-0.45 % sodium chloride infusion (not administered)  insulin regular (NOVOLIN R,HUMULIN R) 250 Units in sodium chloride 0.9 % 250 mL (1 Units/mL) infusion (4.5 Units/hr Intravenous Rate/Dose Verify 12/16/15 1329)  sodium chloride 0.9 % bolus 2,000 mL (0 mLs Intravenous Stopped 12/16/15 1329)  cefTRIAXone (ROCEPHIN) 1 g in dextrose 5 % 50 mL IVPB (0 g Intravenous Stopped 12/16/15 1329)    Filed Vitals:   12/16/15 1141 12/16/15 1201 12/16/15 1236 12/16/15 1315  BP: 121/59 129/70 136/68 145/83  Pulse: 84 85 84 83  Temp:   96.4 F (35.8 C)   TempSrc:   Rectal   Resp: 16 16 13 24   SpO2: 100% 100% 100% 100%    Final diagnoses:  DKA (diabetic ketoacidoses) (HCC)    Admission/ observation were discussed with the admitting physician, patient and/or family and they are comfortable with the plan.    Melene Plan, DO 12/16/15 1426

## 2015-12-16 NOTE — ED Notes (Signed)
Bed: WA16 Expected date:  Expected time:  Means of arrival:  Comments: Ems  

## 2015-12-16 NOTE — ED Notes (Signed)
Per EMS pt from [facility] with c/o weakness and near syncope onset Saturday; CBG HIGH and 20 units insulin given at facility; en route with EMS pt given 200 ml 0.9% NaCl bolus.

## 2015-12-16 NOTE — H&P (Signed)
History and Physical    Benjamin Riddle ALP:[id number] DOB: [birth year] DOA: 12/16/2015  PCP: Jerlyn Ly, MD  Patient coming from: Home   Chief Complaint: fall,   HPI: Benjamin Riddle is a 82 y.o. male with medical history significant of DM type 1, neurogenic bladder, CKD stage III, Recurrent URI, who presents after a near syncope episode. He slip down on the floor, he is feeling generalized weak. He has been having elevated blood sugar at home. He denies chest pain, cough, abdominal pain, diarrhea.  He does I and O cath at home twice a day   ED Course: Patient presents CBG 794, Ph 7.1, Co 2 at 9. UA with too numerous to count WBC, Cr at 3.4,   Review of Systems: negative , except as per HPI  Past Medical History  Diagnosis Date  . Diabetes mellitus   . Asthma   . Hydronephrosis   . Kidney disease     Stage III chronic kidney disease  . Hypertension   . Neurogenic bladder   . Osteoporosis   . Hypogonadism male   . Recurrent urinary tract infection     With resistent Pseudomonas  . Allergic rhinitis   . Hepatitis C     due to blood transfusion  . Hyperlipidemia   . SOB (shortness of breath)   . CKD (chronic kidney disease), stage III   . Combined systolic and diastolic congestive heart failure ([place])   . COPD (chronic obstructive pulmonary disease) ([place])   . Allergy   . Cataract   . Heart murmur     Past Surgical History  Procedure Laterality Date  . Hernia repair    . Tonsillectomy    . Bladder repair      obstruction surgery  . Frontal sinus obliteration      sinus repaired with a plate  . Cataract extraction  2015  . Eye surgery    . Fracture surgery    . Prostate surgery    . Spine surgery       reports that he quit smoking about 4 years ago. His smoking use included Cigars and Cigarettes. He has never used smokeless tobacco. He reports that he does not drink alcohol or use illicit drugs.  Allergies  Allergen Reactions  . Aspirin   . Bactrim  [Sulfamethoxazole-Trimethoprim]   . Codeine Nausea And Vomiting  . Gentamicin     Becomes dizzy and weak  . Lexapro [Escitalopram Oxalate]   . Protonix [Pantoprazole Sodium]   . Ciprofloxacin Itching and Rash    Family History  Problem Relation Age of Onset  . Heart failure Mother   . Hypertension Mother   . COPD Father     smoked  . Diabetes Father   . Cancer Sister     Breast  . Diabetes Sister   . COPD Brother     smoked     Prior to Admission medications   Medication Sig Start Date End Date Taking? Authorizing Provider  albuterol (2.5 MG/3ML) 0.083% NEBU 3 mL, albuterol (5 MG/ML) 0.5% NEBU 0.5 mL Inhale 5 mg into the lungs every 6 (six) hours as needed (wheezing).    Historical Provider, MD  albuterol (PROVENTIL,VENTOLIN) 90 MCG/ACT inhaler Inhale 2 puffs into the lungs every 4 (four) hours as needed for wheezing or shortness of breath.     Historical Provider, MD  bismuth subsalicylate (PEPTO BISMOL) 262 MG/15ML suspension Take 30 mLs by mouth every 6 (six) hours as needed for indigestion or diarrhea  or loose stools.    Historical Provider, MD  cholecalciferol (VITAMIN D) 1000 UNITS tablet Take 1,000 Units by mouth daily.    Historical Provider, MD  Insulin Glargine (TOUJEO SOLOSTAR) 300 UNIT/ML SOPN Inject 12 Units into the skin at bedtime.    Historical Provider, MD  insulin lispro (HUMALOG) 100 UNIT/ML injection Inject 6-8 Units into the skin 3 (three) times daily before meals. 6 units at breakfast, 4 units at lunch,  8 units at supper    Historical Provider, MD  mometasone-formoterol (DULERA) 100-5 MCG/ACT AERO Inhale 2 puffs into the lungs 2 (two) times daily.    Historical Provider, MD  NOVOLOG 100 UNIT/ML injection CONTINUE 8 UNITS IN AM, 4 U AT LUNCH, 8 U BEFORE SUPPER, PLUS 1 UNIT PER 50MG/DL ABOVE 150 MG/DL 12/04/15   Historical Provider, MD  saccharomyces boulardii (FLORASTOR) 250 MG capsule Take 1 capsule (250 mg total) by mouth 2 (two) times daily. 01/26/15   Robbie Lis, MD  simvastatin (ZOCOR) 40 MG tablet Take 40 mg by mouth daily.      Historical Provider, MD  zolpidem (AMBIEN) 5 MG tablet Take 1 tablet (5 mg total) by mouth at bedtime as needed for sleep. 06/22/15   Florencia Reasons, MD    Physical Exam: Filed Vitals:   12/16/15 1141 12/16/15 1201 12/16/15 1236 12/16/15 1315  BP: 121/59 129/70 136/68 145/83  Pulse: 84 85 84 83  Temp:   96.4 F (35.8 C)   TempSrc:   Rectal   Resp: _0 SpO2: 100% 100% 100% 100%      Constitutional: NAD, calm, comfortable Filed Vitals:   12/16/15 1141 12/16/15 1201 12/16/15 1236 12/16/15 1315  BP: 121/59 129/70 136/68 145/83  Pulse: 84 85 84 83  Temp:   96.4 F (35.8 C)   TempSrc:   Rectal   Resp: _1 SpO2: 100% 100% 100% 100%   Eyes: PERRL, lids and conjunctivae normal ENMT: Mucous membranes are moist. Posterior pharynx clear of any exudate or lesions.Normal dentition.  Neck: normal, supple, no masses, no thyromegaly Respiratory: clear to auscultation bilaterally, no wheezing, no crackles. Normal respiratory effort. No accessory muscle use.  Cardiovascular: Regular rate and rhythm, no murmurs / rubs / gallops. No extremity edema. 2+ pedal pulses. No carotid bruits.  Abdomen: no tenderness, no masses palpated. No hepatosplenomegaly. Bowel sounds positive.  Musculoskeletal: no clubbing / cyanosis. No joint deformity upper and lower extremities. Good ROM, no contractures. Normal muscle tone.  Skin: no rashes, lesions, ulcers. No induration Neurologic: CN 2-12 grossly intact. Sensation intact, DTR normal. Strength 5/5 in all 4.  Psychiatric: Normal judgment and insight. Alert and oriented x 3. Normal mood.     Labs on Admission: I have personally reviewed following labs and imaging studies  CBC:  Recent Labs Lab 12/16/15 1110  WBC 14.5*  HGB 11.8*  HCT 38.5*  MCV 100.8*  PLT 948   Basic Metabolic Panel:  Recent Labs Lab 12/16/15 1110  NA 129*  K 5.2*  CL 97*  CO2 9*    GLUCOSE 794*  BUN 70*  CREATININE 3.43*  CALCIUM 8.7*   GFR: CrCl cannot be calculated (Unknown ideal weight.). Liver Function Tests: No results for input(s): AST, ALT, ALKPHOS, BILITOT, PROT, ALBUMIN in the last 168 hours. No results for input(s): LIPASE, AMYLASE in the last 168 hours. No results for input(s): AMMONIA in the last 168 hours. Coagulation Profile: No results for input(s): INR, PROTIME in the  last 168 hours. Cardiac Enzymes: No results for input(s): CKTOTAL, CKMB, CKMBINDEX, TROPONINI in the last 168 hours. BNP (last 3 results) No results for input(s): PROBNP in the last 8760 hours. HbA1C: No results for input(s): HGBA1C in the last 72 hours. CBG:  Recent Labs Lab 12/16/15 1051 12/16/15 1228 12/16/15 1326  GLUCAP >600* >600* 510*   Lipid Profile: No results for input(s): CHOL, HDL, LDLCALC, TRIG, CHOLHDL, LDLDIRECT in the last 72 hours. Thyroid Function Tests: No results for input(s): TSH, T4TOTAL, FREET4, T3FREE, THYROIDAB in the last 72 hours. Anemia Panel: No results for input(s): VITAMINB12, FOLATE, FERRITIN, TIBC, IRON, RETICCTPCT in the last 72 hours. Urine analysis:    Component Value Date/Time   COLORURINE YELLOW 12/16/2015 1141   APPEARANCEUR TURBID* 12/16/2015 1141   LABSPEC 1.014 12/16/2015 1141   PHURINE 6.0 12/16/2015 1141   GLUCOSEU >1000* 12/16/2015 1141   HGBUR MODERATE* 12/16/2015 1141   BILIRUBINUR NEGATIVE 12/16/2015 1141   KETONESUR 15* 12/16/2015 1141   PROTEINUR 100* 12/16/2015 1141   UROBILINOGEN 0.2 04/06/2015 2033   NITRITE NEGATIVE 12/16/2015 1141   LEUKOCYTESUR LARGE* 12/16/2015 1141   Sepsis Labs: !!!!!!!!!!!!!!!!!!!!!!!!!!!!!!!!!!!!!!!!!!!! _0 (procalcitonin:4,lacticidven:4) )No results found for this or any previous visit (from the past 240 hour(s)).   Radiological Exams on Admission: No results found.  EKG: Independently reviewed. Sinus rythm.   Assessment/Plan Active Problems:   DKA (diabetic  ketoacidoses) ([place])  1-DKA; Presents with hyperglycemia, metabolic acidosis, elevated anion gap at 23.  Admit to step down unit. IV insulin Gtt and IV fluids.  B-met every 4 hours, CBG every hour.  Transition to long actin insulin when gap close and bicarb above 20./  Check chest x ray.   2-UTI;  UA With too numerous to count WBC.  IV ceftriaxone.  Follow Urine culture.   3-Hyperkalemia; Will improved with correction of acidosis.  Follow B-met.   4-CKD stage III; Cr baseline 1.7---2.2.  IV fluids, treatment of UTI. Strict I and O.   5-COPD; continue with nebulizer.    DVT prophylaxis: Heparin  Code Status: wishes to be full code.  Family Communication: care discussed with patient.  Disposition Plan: home at time of discharge Consults called: none Admission status: inpatient to step down unit   Niel Hummer A MD [facility] Pager [phone]  If 7PM-7AM, please contact night-coverage [email or web address] Password [email or web address]  12/16/2015, 1:36 PM

## 2015-12-17 DIAGNOSIS — J449 Chronic obstructive pulmonary disease, unspecified: Secondary | ICD-10-CM

## 2015-12-17 LAB — BASIC METABOLIC PANEL
ANION GAP: 6 (ref 5–15)
BUN: 49 mg/dL — ABNORMAL HIGH (ref 6–20)
CALCIUM: 8 mg/dL — AB (ref 8.9–10.3)
CO2: 21 mmol/L — ABNORMAL LOW (ref 22–32)
CREATININE: 2.24 mg/dL — AB (ref 0.61–1.24)
Chloride: 109 mmol/L (ref 101–111)
GFR, EST AFRICAN AMERICAN: 29 mL/min — AB (ref >60)
GFR, EST NON AFRICAN AMERICAN: 25 mL/min — AB (ref >60)
Glucose, Bld: 109 mg/dL — ABNORMAL HIGH (ref 65–99)
Potassium: 3.8 mmol/L (ref 3.5–5.1)
SODIUM: 136 mmol/L (ref 135–145)

## 2015-12-17 LAB — GLUCOSE, CAPILLARY
GLUCOSE-CAPILLARY: 214 mg/dL — AB (ref 65–99)
GLUCOSE-CAPILLARY: 164 mg/dL — AB (ref 65–99)
GLUCOSE-CAPILLARY: 105 mg/dL — AB (ref 65–99)
GLUCOSE-CAPILLARY: 88 mg/dL (ref 65–99)
GLUCOSE-CAPILLARY: 243 mg/dL — AB (ref 65–99)
GLUCOSE-CAPILLARY: 276 mg/dL — AB (ref 65–99)
Glucose-Capillary: 71 mg/dL (ref 65–99)
Glucose-Capillary: 387 mg/dL — ABNORMAL HIGH (ref 65–99)

## 2015-12-17 MED ORDER — INSULIN ASPART 100 UNIT/ML ~~LOC~~ SOLN
4.0000 [IU] | Freq: Three times a day (TID) | SUBCUTANEOUS | Status: DC
  Administered 2015-12-17 – 2015-12-20 (×7): 4 [IU] via SUBCUTANEOUS

## 2015-12-17 MED ORDER — GLUCERNA SHAKE PO LIQD
237.0000 mL | Freq: Two times a day (BID) | ORAL | Status: DC
  Administered 2015-12-17 – 2015-12-18 (×3): 237 mL via ORAL
  Filled 2015-12-17 (×4): qty 237

## 2015-12-17 MED ORDER — INSULIN ASPART 100 UNIT/ML ~~LOC~~ SOLN
0.0000 [IU] | Freq: Three times a day (TID) | SUBCUTANEOUS | Status: DC
  Administered 2015-12-17: 9 [IU] via SUBCUTANEOUS
  Administered 2015-12-17: 3 [IU] via SUBCUTANEOUS
  Administered 2015-12-18: 5 [IU] via SUBCUTANEOUS
  Administered 2015-12-18: 2 [IU] via SUBCUTANEOUS
  Administered 2015-12-19: 1 [IU] via SUBCUTANEOUS

## 2015-12-17 MED ORDER — GLUCERNA PO LIQD: 237.0000 mL | Freq: Two times a day (BID) | ORAL | Status: DC

## 2015-12-17 NOTE — Progress Notes (Signed)
PROGRESS NOTE    Benjamin Riddle  UGQ:[id number] DOB: 1927-07-25 DOA: 12/16/2015 PCP: Jerlyn Ly, MD (Confirm with patient/family/NH records and if not entered, this HAS to be entered at [facility] point of entry. 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 He slip down on the floor, he is feeling generalized weak. He has been having elevated blood sugar at home. He denies chest pain, cough, abdominal pain, diarrhea.  He does I and O cath at home twice a day.  Patient presents CBG 794, Ph 7.1, Co 2 at 9. UA with too numerous to count WBC, Cr at 3.4,  Patient admitted with DKA and UTI.   Assessment & Plan:   Active Problems:   COPD GOLD III    Combined systolic and diastolic congestive heart failure ([facility])   DKA (diabetic ketoacidoses) ([place])  1-DKA; Presents with hyperglycemia, metabolic acidosis, elevated anion gap at 23.  He was transition from insulin Gtt to lantus.  Chest x ray negative for PAN Add meal coverage.   2-UTI;  UA With too numerous to count WBC.  IV ceftriaxone.  Follow Urine culture.   3-Hyperkalemia; Will improved with correction of acidosis.  Follow B-met.   4-CKD stage III; Cr baseline 1.7---2.2.  IV fluids, treatment of UTI. Strict I and O.   5-COPD; continue with nebulizer.      DVT prophylaxis: Heparin  Code Status:Full code.  Family Communication: care discussed with patient.  Disposition Plan: transfer to Med-surgery, follow urine culture. Home in 24 to 48 hours.    Consultants:   none   Procedures:   none  Antimicrobials:   Ceftriaxone.    Subjective: He si feeling well, better. He would like to have regular diet, he will be carefull with carb.    Objective: Filed Vitals:   12/17/15 0500 12/17/15 0600 12/17/15 0700 12/17/15 0808  BP:  102/51    Pulse:  68     Temp:    98.6 F (37 C)  TempSrc:    Oral  Resp: _0 Height:      Weight:      SpO2: 99%       Intake/Output Summary (Last 24 hours) at 12/17/15 0809 Last data filed at 12/17/15 0445  Gross per 24 hour  Intake 2875.32 ml  Output   1300 ml  Net 1575.32 ml   Filed Weights   12/16/15 1500  Weight: 57.7 kg (127 lb 3.3 oz)    Examination:  General exam: Appears calm and comfortable  Respiratory system: Clear to auscultation. Respiratory effort normal. pectus excavatum  Cardiovascular system: S1 & S2 heard, RRR. No JVD, murmurs, rubs, gallops or clicks. No pedal edema. Gastrointestinal system: Abdomen is nondistended, soft and nontender. No organomegaly or masses felt. Normal bowel sounds heard. Central nervous system: Alert and oriented. No focal neurological deficits. Extremities: Symmetric 5 x 5 power. Skin: No rashes, lesions or ulcers Psychiatry: Judgement and insight appear normal. Mood & affect appropriate.     Data Reviewed: I have personally reviewed following labs and imaging studies  CBC:  Recent Labs Lab 12/16/15 1110  WBC 14.5*  HGB 11.8*  HCT 38.5*  MCV 100.8*  PLT 882   Basic Metabolic Panel:  Recent Labs Lab 12/16/15 1110 12/16/15 1514 12/16/15 1843 12/16/15 2239 12/17/15 0309  NA  129* 135 136 137 136  K 5.2* 5.1 3.9 4.5 3.8  CL 97* 106 107 107 109  CO2 9* 15* 21* 19* 21*  GLUCOSE 794* 389* 122* 229* 109*  BUN 70* 60* 58* 55* 49*  CREATININE 3.43* 2.88* 2.43* 2.47* 2.24*  CALCIUM 8.7* 8.4* 8.3* 8.2* 8.0*   GFR: Estimated Creatinine Clearance: 19.7 mL/min (by C-G formula based on Cr of 2.24). Liver Function Tests: No results for input(s): AST, ALT, ALKPHOS, BILITOT, PROT, ALBUMIN in the last 168 hours. No results for input(s): LIPASE, AMYLASE in the last 168 hours. No results for input(s): AMMONIA in the last 168 hours. Coagulation Profile: No results for input(s): INR, PROTIME in the last 168 hours. Cardiac Enzymes: No  results for input(s): CKTOTAL, CKMB, CKMBINDEX, TROPONINI in the last 168 hours. BNP (last 3 results) No results for input(s): PROBNP in the last 8760 hours. HbA1C: No results for input(s): HGBA1C in the last 72 hours. CBG:  Recent Labs Lab 12/16/15 2356 12/17/15 0100 12/17/15 0217 12/17/15 0322 12/17/15 0725  GLUCAP 247* 214* 164* 105* 71   Lipid Profile: No results for input(s): CHOL, HDL, LDLCALC, TRIG, CHOLHDL, LDLDIRECT in the last 72 hours. Thyroid Function Tests: No results for input(s): TSH, T4TOTAL, FREET4, T3FREE, THYROIDAB in the last 72 hours. Anemia Panel: No results for input(s): VITAMINB12, FOLATE, FERRITIN, TIBC, IRON, RETICCTPCT in the last 72 hours. Sepsis Labs: No results for input(s): PROCALCITON, LATICACIDVEN in the last 168 hours.  Recent Results (from the past 240 hour(s))  MRSA PCR Screening     Status: None   Collection Time: 12/16/15  3:11 PM  Result Value Ref Range Status   MRSA by PCR NEGATIVE NEGATIVE Final    Comment:        The GeneXpert MRSA Assay (FDA approved for NASAL specimens only), is one component of a comprehensive MRSA colonization surveillance program. It is not intended to diagnose MRSA infection nor to guide or monitor treatment for MRSA infections.          Radiology Studies: Dg Chest 1 View  12/16/2015  CLINICAL DATA:  Hyperglycemia.  COPD.  Hypertension. EXAM: CHEST 1 VIEW COMPARISON:  11/07/2015 FINDINGS: Atherosclerotic and tortuous thoracic aorta. The patient is rotated to the right on today's radiograph, reducing diagnostic sensitivity and specificity. There is biapical pleural parenchymal scarring with asymmetrically prominent involvement of the left lung apex, but this is chronic from 2009 enhance unlikely to be due to malignancy. From 2009, there may be slight increase in the inferolateral component of this process on the left. Thoracic spondylosis. Degenerative glenohumeral arthropathy, right greater than left.  Heart size within normal limits. IMPRESSION: 1. Biapical pleural parenchymal scarring, left greater than right, but chronic. 2. Atherosclerotic and tortuous thoracic aorta. Heart size within normal limits. 3. Thoracic spondylosis. Electronically Signed   By: Van Clines M.D.   On: 12/16/2015 16:31        Scheduled Meds: . cefTRIAXone (ROCEPHIN)  IV  1 g Intravenous Q24H  . cholecalciferol  1,000 Units Oral Daily  . heparin  5,000 Units Subcutaneous Q8H  . insulin aspart  0-9 Units Subcutaneous TID WC  . insulin glargine  10 Units Subcutaneous QHS  . mometasone-formoterol  2 puff Inhalation BID  . saccharomyces boulardii  250 mg Oral BID  . simvastatin  40 mg Oral Daily   Continuous Infusions: . sodium chloride       LOS: 1 day    Time spent: 35 minutes.     Jael Waldorf,  Cassie Freer, MD [facility] Pager [phone]  If 7PM-7AM, please contact night-coverage [email or web address] Password [email or web address] 12/17/2015, 8:09 AM

## 2015-12-17 NOTE — Progress Notes (Signed)
Transferred to room 1505 via wheelchair .

## 2015-12-17 NOTE — Progress Notes (Signed)
Pt tranferred from ICU to 1505. Pt AO x 4. Pt made aware of the unit regulations and to call before getting out of bed. Fall contract signed with pt. No questions or concerns from the pt at this time.  Katrina Brosh W Letesha Klecker, RN

## 2015-12-17 NOTE — Care Management Note (Signed)
Case Management Note  Patient Details  Name: Benjamin Riddle MRN: [id number] Date of Birth: 1927-11-21  Subjective/Objective:           Diabetes with elevated wbc and near syncopal episode         Action/Plan:Date:  Dec 17, 2015 Chart reviewed for concurrent status and case management needs. Will continue to follow patient for changes and needs: Expected discharge date: [id number] Marcelle SmilingRhonda Jeannett Dekoning, BSN, [place], [place]   [phone]   Expected Discharge Date:   (unknown)               Expected Discharge Plan:  Home/Self Care  In-House Referral:  NA  Discharge planning Services  CM Consult  Post Acute Care Choice:  NA Choice offered to:  NA  DME Arranged:  N/A DME Agency:  NA  HH Arranged:  NA HH Agency:  NA  Status of Service:     Medicare Important Message Given:    Date Medicare IM Given:    Medicare IM give by:    Date Additional Medicare IM Given:    Additional Medicare Important Message give by:     If discussed at Long Length of Stay Meetings, dates discussed:    Additional Comments:  Golda AcreDavis, Taejon Irani Lynn, RN 12/17/2015, 8:31 AM

## 2015-12-18 DIAGNOSIS — E875 Hyperkalemia: Secondary | ICD-10-CM

## 2015-12-18 DIAGNOSIS — E1310 Other specified diabetes mellitus with ketoacidosis without coma: Secondary | ICD-10-CM

## 2015-12-18 DIAGNOSIS — N183 Chronic kidney disease, stage 3 (moderate): Secondary | ICD-10-CM

## 2015-12-18 LAB — CBC
HCT: 35.2 % — ABNORMAL LOW (ref 39.0–52.0)
HEMOGLOBIN: 11.8 g/dL — AB (ref 13.0–17.0)
MCH: 31 pg (ref 26.0–34.0)
MCHC: 33.5 g/dL (ref 30.0–36.0)
MCV: 92.4 fL (ref 78.0–100.0)
Platelets: 258 10*3/uL (ref 150–400)
RBC: 3.81 MIL/uL — AB (ref 4.22–5.81)
RDW: 14.7 % (ref 11.5–15.5)
WBC: 6.8 10*3/uL (ref 4.0–10.5)

## 2015-12-18 LAB — BASIC METABOLIC PANEL
Anion gap: 8 (ref 5–15)
BUN: 39 mg/dL — AB (ref 6–20)
CHLORIDE: 107 mmol/L (ref 101–111)
CO2: 24 mmol/L (ref 22–32)
CREATININE: 1.76 mg/dL — AB (ref 0.61–1.24)
Calcium: 8.6 mg/dL — ABNORMAL LOW (ref 8.9–10.3)
GFR calc Af Amer: 39 mL/min — ABNORMAL LOW (ref >60)
GFR calc non Af Amer: 34 mL/min — ABNORMAL LOW (ref >60)
GLUCOSE: 163 mg/dL — AB (ref 65–99)
POTASSIUM: 4.7 mmol/L (ref 3.5–5.1)
SODIUM: 139 mmol/L (ref 135–145)

## 2015-12-18 LAB — URINE CULTURE: Culture: <10000 — AB

## 2015-12-18 LAB — GLUCOSE, CAPILLARY
GLUCOSE-CAPILLARY: 38 mg/dL — AB (ref 65–99)
GLUCOSE-CAPILLARY: 92 mg/dL (ref 65–99)
GLUCOSE-CAPILLARY: 71 mg/dL (ref 65–99)
GLUCOSE-CAPILLARY: 318 mg/dL — AB (ref 65–99)
GLUCOSE-CAPILLARY: 274 mg/dL — AB (ref 65–99)
Glucose-Capillary: 45 mg/dL — ABNORMAL LOW (ref 65–99)
Glucose-Capillary: 182 mg/dL — ABNORMAL HIGH (ref 65–99)

## 2015-12-18 MED ORDER — DEXTROSE 50 % IV SOLN
1.0000 | Freq: Once | INTRAVENOUS | Status: AC
  Administered 2015-12-18: 25 mL via INTRAVENOUS

## 2015-12-18 MED ORDER — DEXTROSE 50 % IV SOLN
INTRAVENOUS | Status: AC
  Filled 2015-12-18: qty 50

## 2015-12-18 MED ORDER — GLUCERNA SHAKE PO LIQD
237.0000 mL | Freq: Three times a day (TID) | ORAL | Status: DC
  Administered 2015-12-18 – 2015-12-19 (×4): 237 mL via ORAL
  Filled 2015-12-18 (×7): qty 237

## 2015-12-18 MED ORDER — INSULIN GLARGINE 100 UNIT/ML ~~LOC~~ SOLN
8.0000 [IU] | Freq: Every day | SUBCUTANEOUS | Status: DC
  Administered 2015-12-18 – 2015-12-19 (×2): 8 [IU] via SUBCUTANEOUS
  Filled 2015-12-18 (×2): qty 0.08

## 2015-12-18 NOTE — Progress Notes (Signed)
Patient ID: Benjamin Riddle, male   DOB: 08/06/1927, 82 y.o.   MRN: [id number]  PROGRESS NOTE    Prathik Aman  UDT:[id number] DOB: 1927/10/12 DOA: 12/16/2015  PCP: Jerlyn Ly, MD   Brief Narrative:  82 year old male with past medical history of type 1 diabetes mellitus, neurogenic bladder, chronic kidney disease stage III who presented to [facility] long hospital status post near syncope. Patient reported generalized weakness. At home he had elevated blood sugar. On admission, patient was found to be in DKA which at this time has resolved. He was also found to have urinary tract infection which is treated with Rocephin.  Assessment & Plan:  Diabetic ketoacidosis - DKA criteria met on the admission with hypoglycemia, metabolic acidosis, elevated anion gap at 23 - Patient initially on insulin drip now on subcutaneous insulin - CBGs in past 24 hours: 45, 92, 182 - Per diabetic coordinator, decrease Lantus from 10 units to 8 units. Continue normal 4 units 3 times daily and sliding scale insulin   Urinary tract infection - No significant growth on urine culture. He is on Rocephin which will continue for next 24 hours.  Hyperkalemia - Improved with correction of metabolic acidosis - Potassium within normal limits  Chronic kidney disease stage III - Baseline creatinine 1.7 up to 2.2 and creatinine is at baseline range  COPD - Stable respiratory status   DVT prophylaxis: Heparin subQ Code Status:Full code.  Family Communication: Family not at bedside Disposition Plan: 6/1   Consultants:   none  Procedures:  none  Antimicrobials:   Ceftriaxone.   Subjective: Feels okay.   Objective: Filed Vitals:   12/17/15 2045 12/18/15 0010 12/18/15 0437 12/18/15 0845  BP: 127/66 126/79 130/71   Pulse: 69 68 74   Temp: 98 F (36.7 C) 98.2 F (36.8 C) 97.6 F (36.4 C)   TempSrc: Oral Oral Oral   Resp: 16 16 16    Height:      Weight:      SpO2: 99% 97% 100% 98%     Intake/Output Summary (Last 24 hours) at 12/18/15 1131 Last data filed at 12/18/15 0800  Gross per 24 hour  Intake   1460 ml  Output   1650 ml  Net   -190 ml   Filed Weights   12/16/15 1500  Weight: 57.7 kg (127 lb 3.3 oz)    Examination:  General exam: Appears calm and comfortable  Respiratory system: Clear to auscultation. Respiratory effort normal. Cardiovascular system: S1 & S2 heard, RRR. No JVD, murmurs, rubs, gallops or clicks. No pedal edema. Gastrointestinal system: Abdomen is nondistended, soft and nontender. No organomegaly or masses felt. Normal bowel sounds heard. Central nervous system: Alert and oriented. No focal neurological deficits. Extremities: Symmetric 5 x 5 power. Skin: No rashes, lesions or ulcers Psychiatry: Judgement and insight appear normal. Mood & affect appropriate.   Data Reviewed: I have personally reviewed following labs and imaging studies  CBC:  Recent Labs Lab 12/16/15 1110 12/18/15 0510  WBC 14.5* 6.8  HGB 11.8* 11.8*  HCT 38.5* 35.2*  MCV 100.8* 92.4  PLT 358 972   Basic Metabolic Panel:  Recent Labs Lab 12/16/15 1514 12/16/15 1843 12/16/15 2239 12/17/15 0309 12/18/15 0510  NA 135 136 137 136 139  K 5.1 3.9 4.5 3.8 4.7  CL 106 107 107 109 107  CO2 15* 21* 19* 21* 24  GLUCOSE 389* 122* 229* 109* 163*  BUN 60* 58* 55* 49* 39*  CREATININE 2.88* 2.43* 2.47* 2.24* 1.76*  CALCIUM 8.4* 8.3* 8.2* 8.0* 8.6*   GFR: Estimated Creatinine Clearance: 25 mL/min (by C-G formula based on Cr of 1.76). Liver Function Tests: No results for input(s): AST, ALT, ALKPHOS, BILITOT, PROT, ALBUMIN in the last 168 hours. No results for input(s): LIPASE, AMYLASE in the last 168 hours. No results for input(s): AMMONIA in the last 168 hours. Coagulation Profile: No results for input(s): INR, PROTIME in the last 168 hours. Cardiac Enzymes: No results for input(s): CKTOTAL, CKMB, CKMBINDEX, TROPONINI in the last 168 hours. BNP (last 3  results) No results for input(s): PROBNP in the last 8760 hours. HbA1C: No results for input(s): HGBA1C in the last 72 hours. CBG:  Recent Labs Lab 12/17/15 2047 12/18/15 0321 12/18/15 0340 12/18/15 0408 12/18/15 0720  GLUCAP 276* 38* 45* 92 182*   Lipid Profile: No results for input(s): CHOL, HDL, LDLCALC, TRIG, CHOLHDL, LDLDIRECT in the last 72 hours. Thyroid Function Tests: No results for input(s): TSH, T4TOTAL, FREET4, T3FREE, THYROIDAB in the last 72 hours. Anemia Panel: No results for input(s): VITAMINB12, FOLATE, FERRITIN, TIBC, IRON, RETICCTPCT in the last 72 hours. Urine analysis:    Component Value Date/Time   COLORURINE YELLOW 12/16/2015 1141   APPEARANCEUR TURBID* 12/16/2015 1141   LABSPEC 1.014 12/16/2015 1141   PHURINE 6.0 12/16/2015 1141   GLUCOSEU >1000* 12/16/2015 1141   HGBUR MODERATE* 12/16/2015 1141   BILIRUBINUR NEGATIVE 12/16/2015 1141   KETONESUR 15* 12/16/2015 1141   PROTEINUR 100* 12/16/2015 1141   UROBILINOGEN 0.2 04/06/2015 2033   NITRITE NEGATIVE 12/16/2015 1141   LEUKOCYTESUR LARGE* 12/16/2015 1141   Sepsis Labs: @LABRCNTIP (procalcitonin:4,lacticidven:4)  Recent Results (from the past 240 hour(s))  MRSA PCR Screening     Status: None   Collection Time: 12/16/15  3:11 PM  Result Value Ref Range Status   MRSA by PCR NEGATIVE NEGATIVE Final  Urine culture     Status: Abnormal   Collection Time: 12/16/15 10:00 PM  Result Value Ref Range Status   Specimen Description URINE, CATHETERIZED  Final   Special Requests NONE  Final   Culture (A)  Final    <10,000 COLONIES/mL INSIGNIFICANT GROWTH Performed at [facility]    Report Status 12/18/2015 FINAL  Final      Radiology Studies: Dg Chest 1 View 12/16/2015   1. Biapical pleural parenchymal scarring, left greater than right, but chronic. 2. Atherosclerotic and tortuous thoracic aorta. Heart size within normal limits. 3. Thoracic spondylosis. Electronically Signed   By: Van Clines M.D.   On: 12/16/2015 16:31     Scheduled Meds: . cefTRIAXone (ROCEPHIN)  IV  1 g Intravenous Q24H  . cholecalciferol  1,000 Units Oral Daily  . dextrose      . feeding supplement (GLUCERNA SHAKE)  237 mL Oral BID BM  . heparin  5,000 Units Subcutaneous Q8H  . insulin aspart  0-9 Units Subcutaneous TID WC  . insulin aspart  4 Units Subcutaneous TID WC  . insulin glargine  8 Units Subcutaneous QHS  . mometasone-formoterol  2 puff Inhalation BID  . saccharomyces boulardii  250 mg Oral BID  . simvastatin  40 mg Oral Daily   Continuous Infusions:    LOS: 2 days    Time spent: 15 minutes Greater than 50% of the time spent on counseling and coordinating the care.   Leisa Lenz, MD [facility] Pager [phone]  If 7PM-7AM, please contact night-coverage [email or web address] Password [facility] 12/18/2015, 11:31 AM

## 2015-12-18 NOTE — Progress Notes (Signed)
Inpatient Diabetes Program Recommendations  AACE/ADA: New Consensus Statement on Inpatient Glycemic Control (2015)  Target Ranges:  Prepandial:   less than 140 mg/dL      Peak postprandial:   less than 180 mg/dL (1-2 hours)      Critically ill patients:  140 - 180 mg/dL   Review of Glycemic Control  Results for Arrie AranCASSUTO, Nashaun (MRN [id number]) as of 12/18/2015 09:13  Ref. Range 12/18/2015 07:20  Glucose-Capillary Latest Ref Range: 65-99 mg/dL 409182 (H)  Results for Arrie AranCASSUTO, Chan (MRN [id number]) as of 12/18/2015 09:13  Ref. Range 12/17/2015 07:25 12/17/2015 11:41 12/17/2015 17:37 12/17/2015 20:47 12/18/2015 03:21 12/18/2015 03:40 12/18/2015 04:08 12/18/2015 07:20  Glucose-Capillary Latest Ref Range: 65-99 mg/dL 71 956243 (H) 213387 (H) 086276 (H) 38 (LL) 45 (L) 92 182 (H)   Hypoglycemia during night. Needs insulin adjustment.  Inpatient Diabetes Program Recommendations:    Decrease Lantus to 8 units QHS Continue Novolog 4 units tidwc for meal coverage, as post-prandial blood sugars elevated.  Will continue to follow. Thank you. Ailene Ardshonda Imogen Maddalena, RD, LDN, CDE Inpatient Diabetes Coordinator [phone]

## 2015-12-18 NOTE — Progress Notes (Signed)
Hypoglycemic Event  CBG:  Time:  0323          Result: 38  Treatment: 480mL OJ, MD notified  Symptoms:  Non-symptomatic; not lightheaded or dizzy, Alert and oriented x4  Follow-up CBG: Time: 0340         CBG Result:  45   Treatment: 25mL IV Dextrose 50% solution  Second Follow- up:  Time:  0408          CBG Result:  92   Possible Reasons for Event: 9 units Novolog 5/30 @1755  + 10 units lantus 5/30 @ 2113  Comments/MD notified: K. Schorr    Benjamin Riddle

## 2015-12-18 NOTE — Clinical Social Work Note (Addendum)
Clinical Social Work Assessment  Patient Details  Name: Benjamin Riddle MRN: [id number] Date of Birth: [birth year]  Date of referral:  12/18/15               Reason for consult:  Facility Placement                Permission sought to share information with:  Case Manager Permission granted to share information::     Name::      Malcolm::     Relationship::     Contact Information:     Housing/Transportation Living arrangements for the past 2 months:  Independent Community ([facility]) Source of Information:  Patient, Spouse Patient Interpreter Needed:  None Criminal Activity/Legal Involvement Pertinent to Current Situation/Hospitalization:  No - Comment as needed Significant Relationships:  Spouse Lives with:  Facility Resident Do you feel safe going back to the place where you live?  Yes Need for family participation in patient care:  Yes (Comment)  Care giving concerns:  Patient and patient wife reported that he has been living at [facility] for past month and plans to return. Patient waiting to see MD about urine cultures and blood suagar.    Social Worker assessment / plan:  LCSWA met with family at bedside and explained role and reason for consult. Patient was receptive to [facility] assistance for his return to [employer]. LCSWA explained to patient that he will have to meet with PT prior to returning to facility. Patient did not have any concerns.   Plan: DC back to SNF: [facility]   Employment status:  Retired Nurse, adult PT Recommendations:  [place] / Referral to community resources:     Patient/Family's Response to care: Agreeable Patient/Family's Understanding of and Emotional Response to Diagnosis, Current Treatment, and Prognosis: "I know we are waiting to see the doctor about urine cultures, I do not think he medically stable to leave." Patient and patient husband well versed about care and  current treatment.   Emotional Assessment Appearance:  Appears stated age Attitude/Demeanor/Rapport:   (Calm, Pleasant) Affect (typically observed):  Accepting, Calm Orientation:  Oriented to Self, Oriented to Place, Oriented to  Time, Oriented to Situation Alcohol / Substance use:  Not Applicable Psych involvement (Current and /or in the community):  No (Comment)  Discharge Needs  Concerns to be addressed:  No discharge needs identified Readmission within the last 30 days:  Yes Current discharge risk:  None Barriers to Discharge:  No Barriers Identified   Lia Hopping, LCSW 12/18/2015, 11:13 AM

## 2015-12-18 NOTE — Progress Notes (Signed)
Initial Nutrition Assessment  DOCUMENTATION CODES:   Severe malnutrition in context of chronic illness  INTERVENTION:  -Glucerna Shake po TID, each supplement provides 220 kcal and 10 grams of protein -RD to continue to monitor  NUTRITION DIAGNOSIS:   Malnutrition related to chronic illness as evidenced by severe depletion of muscle mass, severe depletion of body fat.  GOAL:   Patient will meet greater than or equal to 90% of their needs  MONITOR:   PO intake, Supplement acceptance, I & O's, Labs, Skin  REASON FOR ASSESSMENT:   Malnutrition Screening Tool    ASSESSMENT:   Benjamin Riddle is a 82 y.o. male with medical history significant of DM type 1, neurogenic bladder, CKD stage III, Recurrent URI, who presents after a near syncope episode. He slip down on the floor, he is feeling generalized weak.  Benjamin Riddle is well known to me. He was here 4/19, with poor appetite and weight loss in addition to hyperglycemia. Unclear if patient went into DKA that time. She presents with DKA this time. Endorses 15# wt loss over 6 months, but does not demonstrate such per chart. He currently exhibits a 5#/3.7% insignificant wt loss over 6 months. States PO intake to be good, but likely continues to consume less than his needs based on malnutrition status. From previous assessment pt admitted he doesn't eat as much as he used to, continues.  Documented PO Intake during stay 100%. He had an omelet, bacon, toast, and yogurt this morning he states he ate 100% of. No nausea/vomiting No issues chewing/swallowing.  Nutrition-Focused physical exam completed. Findings are severe fat depletion, severe muscle depletion, and no edema.   Given malnutrition status, will provide GS TID.  Labs and Medications reviewed: Cr 1.76, BUN 39, EGFR 34 Vitamin D 1000iu PO   Diet Order:  Diet regular Room service appropriate?: Yes; Fluid consistency:: Thin  Skin:  Reviewed, no issues  Last BM:   5/30  Height:   Ht Readings from Last 1 Encounters:  12/16/15 _0  (1.753 m)    Weight:   Wt Readings from Last 1 Encounters:  12/16/15 127 lb 3.3 oz (57.7 kg)    Ideal Body Weight:     BMI:  Body mass index is 18.78 kg/(m^2).  Estimated Nutritional Needs:   Kcal:  1450-1750 calories  Protein:  60-70 grams  Fluid:  >/= 1.5L  EDUCATION NEEDS:   No education needs identified at this time  Benjamin Anis. Vollie Aaron, MS, RD LDN Inpatient Clinical Dietitian Pager [phone]

## 2015-12-19 DIAGNOSIS — N390 Urinary tract infection, site not specified: Secondary | ICD-10-CM

## 2015-12-19 LAB — GLUCOSE, CAPILLARY
Glucose-Capillary: 96 mg/dL (ref 65–99)
Glucose-Capillary: 147 mg/dL — ABNORMAL HIGH (ref 65–99)
Glucose-Capillary: 415 mg/dL — ABNORMAL HIGH (ref 65–99)
Glucose-Capillary: 297 mg/dL — ABNORMAL HIGH (ref 65–99)

## 2015-12-19 MED ORDER — CEFUROXIME AXETIL 250 MG PO TABS: 250.0000 mg | ORAL_TABLET | Freq: Every day | ORAL | Status: DC

## 2015-12-19 MED ORDER — INSULIN ASPART 100 UNIT/ML ~~LOC~~ SOLN
15.0000 [IU] | Freq: Once | SUBCUTANEOUS | Status: AC
  Administered 2015-12-19: 15 [IU] via SUBCUTANEOUS

## 2015-12-19 MED ORDER — INSULIN ASPART 100 UNIT/ML ~~LOC~~ SOLN: 4.0000 [IU] | Freq: Three times a day (TID) | SUBCUTANEOUS | Status: DC

## 2015-12-19 MED ORDER — INSULIN GLARGINE 100 UNIT/ML ~~LOC~~ SOLN: 8.0000 [IU] | Freq: Every day | SUBCUTANEOUS | Status: DC

## 2015-12-19 MED ORDER — GLUCERNA SHAKE PO LIQD: 237.0000 mL | Freq: Three times a day (TID) | ORAL | Status: DC

## 2015-12-19 NOTE — Care Management Important Message (Signed)
Important Message  Patient Details  Name: Benjamin Riddle MRN: [id number] Date of Birth: Aug 27, 1927   Medicare Important Message Given:  Yes    Haskell FlirtJamison, Albino Bufford 12/19/2015, 10:13 AMImportant Message  Patient Details  Name: Benjamin Riddle MRN: [id number] Date of Birth: Aug 27, 1927   Medicare Important Message Given:  Yes    Haskell FlirtJamison, Dechelle Attaway 12/19/2015, 10:13 AM

## 2015-12-19 NOTE — Discharge Instructions (Signed)

## 2015-12-19 NOTE — Evaluation (Signed)
Physical Therapy Evaluation Patient Details Name: Benjamin Riddle MRN: [id number] DOB: 1928-06-08 Today's Date: 12/19/2015   History of Present Illness  82 y.o. male with h/o DM, CKD, recurrent UTIs, COPD, HTN, neurogenic bladder (self cath) . Patient admitted with hyperglycemia and UTI  Clinical Impression  Pt admitted as above and presenting with functional mobility limitations 2* generalized weakness and ambulatory balance deficits.  Pt would benefit from follow up rehab at SNF level to maximize IND and safety prior to return to IND living arrangement.    Follow Up Recommendations SNF    Equipment Recommendations  None recommended by PT    Recommendations for Other Services       Precautions / Restrictions Precautions Precautions: Fall Restrictions Weight Bearing Restrictions: No      Mobility  Bed Mobility Overal bed mobility: Modified Independent Bed Mobility: Supine to Sit     Supine to sit: Modified independent (Device/Increase time)     General bed mobility comments: Pt to EOB unassisted  Transfers Overall transfer level: Needs assistance Equipment used: Rolling walker (2 wheeled) Transfers: Sit to/from Stand Sit to Stand: Min assist         General transfer comment: cues for use of UEs to self assist, min assist to bring wt up and fwd  Ambulation/Gait Ambulation/Gait assistance: Min assist Ambulation Distance (Feet): 137 Feet Assistive device: Rolling walker (2 wheeled) Gait Pattern/deviations: Step-through pattern;Decreased step length - right;Decreased step length - left;Shuffle;Trunk flexed;Wide base of support Gait velocity: decr Gait velocity interpretation: Below normal speed for age/gender General Gait Details: Cues for posture and position from RW.  Assist for walker management and stability.  Stairs            Wheelchair Mobility    Modified Rankin (Stroke Patients Only)       Balance Overall balance assessment: Needs  assistance Sitting-balance support: No upper extremity supported;Feet supported Sitting balance-Leahy Scale: Good     Standing balance support: Bilateral upper extremity supported Standing balance-Leahy Scale: Poor                               Pertinent Vitals/Pain Pain Assessment: No/denies pain    Home Living Family/patient expects to be discharged to:: Private residence Living Arrangements: Spouse/significant other Available Help at Discharge: Available 24 hours/day Type of Home: Independent living facility Home Access: Stairs to enter   Entrance Stairs-Number of Steps: 1 Home Layout: One level Home Equipment: Walker - 2 wheels;Cane - single point;Walker - 4 wheels;Shower seat      Prior Function Level of Independence: Needs assistance   Gait / Transfers Assistance Needed: 2 falls PTA, uses RW or cane  ADL's / Homemaking Assistance Needed: has needed more assist recently due to weakness - showering seated  Comments: uses RW or cane     Hand Dominance   Dominant Hand: Right    Extremity/Trunk Assessment   Upper Extremity Assessment: Generalized weakness           Lower Extremity Assessment: Generalized weakness         Communication   Communication: HOH  Cognition Arousal/Alertness: Awake/alert Behavior During Therapy: WFL for tasks assessed/performed Overall Cognitive Status: No family/caregiver present to determine baseline cognitive functioning                      General Comments      Exercises        Assessment/Plan  PT Assessment Patient needs continued PT services  PT Diagnosis Difficulty walking   PT Problem List Decreased strength;Decreased activity tolerance;Decreased balance;Decreased mobility;Decreased knowledge of use of DME;Decreased safety awareness  PT Treatment Interventions DME instruction;Gait training;Stair training;Functional mobility training;Therapeutic activities;Therapeutic exercise;Balance  training;Patient/family education   PT Goals (Current goals can be found in the Care Plan section) Acute Rehab PT Goals Patient Stated Goal: Regain IND PT Goal Formulation: With patient Time For Goal Achievement: 12/26/15 Potential to Achieve Goals: Good    Frequency Min 3X/week   Barriers to discharge        Co-evaluation               End of Session Equipment Utilized During Treatment: Gait belt Activity Tolerance: Patient tolerated treatment well;Patient limited by fatigue Patient left: in chair;with call bell/phone within reach;with chair alarm set Nurse Communication: Mobility status         Time: [id number] PT Time Calculation (min) (ACUTE ONLY): 22 min   Charges:   PT Evaluation $PT Eval Low Complexity: 1 Procedure     PT G Codes:        Tegan Burnside 12/19/2015, 10:51 AM

## 2015-12-19 NOTE — NC FL2 (Signed)
[place] MEDICAID FL2 LEVEL OF CARE SCREENING TOOL     IDENTIFICATION  Patient Name: Benjamin Riddle Birthdate: 1928/03/22 Sex: male Admission Date (Current Location): 12/16/2015  [facility] [place] [place]   [facility] and Address:  [facility],  [address], [place] [id number]      Provider Number: [id number]  Attending Physician Name and Address:  Alison MurrayAlma M Devine, MD  Relative Name and Phone Number:       Current Level of Care: Hospital Recommended Level of Care: Skilled Nursing Facility Prior Approval Number:    Date Approved/Denied:   PASRR Number:   none needed  Discharge Plan: SNF    Current Diagnoses: Patient Active Problem List   Diagnosis Date Noted  . Chronic UTI 11/21/2015  . UTI (urinary tract infection) 11/08/2015  . DM2 (diabetes mellitus, type 2) (HCC) 11/07/2015  . URI (upper respiratory infection) 06/18/2015  . Bronchitis 06/18/2015  . Sepsis (HCC) 06/18/2015  . CAP (community acquired pneumonia) 06/18/2015  . Fall   . Bladder outlet obstruction 04/06/2015  . Protein-calorie malnutrition, severe (HCC) 01/25/2015  . DKA (diabetic ketoacidoses) (HCC) 01/23/2015  . Acute renal failure superimposed on stage 4 chronic kidney disease (HCC) 01/23/2015  . Nausea vomiting and diarrhea 01/04/2015  . Combined systolic and diastolic congestive heart failure (HCC)   . COPD GOLD III  11/23/2014  . Asthmatic bronchitis , chronic (HCC) 08/31/2014  . HLD (hyperlipidemia) 12/09/2009    Orientation RESPIRATION BLADDER Height & Weight     Self, Place, Situation  Normal Incontinent Weight: 127 lb 3.3 oz (57.7 kg) Height:  5\' 9"  (175.3 cm)  BEHAVIORAL SYMPTOMS/MOOD NEUROLOGICAL BOWEL NUTRITION STATUS  Other (Comment) (Calm)   Continent Diet  AMBULATORY STATUS COMMUNICATION OF NEEDS Skin    Extensive Verbally Normal                       Personal Care Assistance Level of Assistance  Bathing, Dressing, Feeding Bathing Assistance:  Limited assistance Feeding assistance: Limited assistance Dressing Assistance: Limited assistance     Functional Limitations Info  Sight, Hearing, Speech Sight Info: Adequate Hearing Info: Adequate Speech Info: Adequate    SPECIAL CARE FACTORS FREQUENCY  PT (By licensed PT)    5x a week                 Contractures Contractures Info: Not present    Additional Factors Info  Code Status, Allergies, Psychotropic, Insulin Sliding Scale, Code Status Info: Full Code Allergies Info: Aspirin, Bactrim, Codeine, Gentamicin, Lexapro, Protonix, Ciprofloxacin   Insulin Sliding Scale Info: insulin aspart (novoLOG) injection 0-9 Units, insulin aspart (novoLOG) injection 4 Units,insulin glargine (LANTUS) injection 8 Units Isolation Precautions Info: None     Current Medications (12/19/2015):  This is the current hospital active medication list Current Facility-Administered Medications  Medication Dose Route Frequency Provider Last Rate Last Dose  . albuterol (PROVENTIL) (2.5 MG/3ML) 0.083% nebulizer solution 2.5 mg  2.5 mg Nebulization Q6H PRN Belkys A Regalado, MD      . bismuth subsalicylate (PEPTO BISMOL) 262 MG/15ML suspension 30 mL  30 mL Oral Q6H PRN Belkys A Regalado, MD      . cefTRIAXone (ROCEPHIN) 1 g in dextrose 5 % 50 mL IVPB  1 g Intravenous Q24H Belkys A Regalado, MD   1 g at 12/19/15 1233  . cholecalciferol (VITAMIN D) tablet 1,000 Units  1,000 Units Oral Daily Belkys A Regalado, MD   1,000 Units at 12/19/15 1001  .  feeding supplement (GLUCERNA SHAKE) (GLUCERNA SHAKE) liquid 237 mL  237 mL Oral TID BM Alison Murray, MD   237 mL at 12/19/15 1400  . heparin injection 5,000 Units  5,000 Units Subcutaneous Q8H Belkys A Regalado, MD   5,000 Units at 12/19/15 1345  . insulin aspart (novoLOG) injection 0-9 Units  0-9 Units Subcutaneous TID WC Leanne Chang, NP   1 Units at 12/19/15 1234  . insulin aspart (novoLOG) injection 4 Units  4 Units Subcutaneous TID WC Belkys A Regalado,  MD   4 Units at 12/19/15 1234  . insulin glargine (LANTUS) injection 8 Units  8 Units Subcutaneous QHS Alison Murray, MD   8 Units at 12/18/15 2218  . mometasone-formoterol (DULERA) 100-5 MCG/ACT inhaler 2 puff  2 puff Inhalation BID Belkys A Regalado, MD   2 puff at 12/19/15 1016  . saccharomyces boulardii (FLORASTOR) capsule 250 mg  250 mg Oral BID Belkys A Regalado, MD   250 mg at 12/19/15 1001  . simvastatin (ZOCOR) tablet 40 mg  40 mg Oral Daily Belkys A Regalado, MD   40 mg at 12/19/15 1001     Discharge Medications: Please see discharge summary for a list of discharge medications.  Relevant Imaging Results:  Relevant Lab Results:   Additional Information  (SSN:[id number])  Raye Sorrow, LCSW

## 2015-12-19 NOTE — Discharge Summary (Addendum)
Physician Discharge Summary  Benjamin Riddle WER:[id number] DOB: [birth year] DOA: 12/16/2015  PCP: Jerlyn Ly, MD  Admit date: 12/16/2015 Discharge date: 12/20/2015  Recommendations for Outpatient Follow-up:  1. Continue Ceftin for 5 days on discharge for UTI  Discharge Diagnoses:  Active Problems:   DKA (diabetic ketoacidoses) (HCC)   COPD GOLD III    Combined systolic and diastolic congestive heart failure ([place])   Discharge Condition: stable   Diet recommendation: as tolerated   History of present illness:  82 year old male with past medical history of type 1 diabetes mellitus, neurogenic bladder, chronic kidney disease stage III who presented to [facility] long hospital status post near syncope. Patient reported generalized weakness. At home he had elevated blood sugar. On admission, patient was found to be in DKA which at this time has resolved. He was also found to have urinary tract infection which was treated with Rocephin.  Hospital Course:   Assessment & Plan:  Diabetic ketoacidosis - DKA criteria met on the admission with hypoglycemia, metabolic acidosis, elevated anion gap at 23 - Patient initially on insulin drip but now on subcutaneous insulin - Continue Lantus 8 units at bedtime  and NovoLog 4 units 3 times daily  Urinary tract infection - No significant growth on urine culture. He is on Rocephin And he can continue this for next 24 hours. - He will continue Ceftin on discharge for 5 days  Hyperkalemia - Improved with correction of metabolic acidosis - Potassium within normal limits  Chronic kidney disease stage III - Baseline creatinine 1.7 up to 2.2 and creatinine is at baseline range  COPD - Stable respiratory status  Severe protein calorie malnutrition - In the context of chronic illness - Seen by dietician   DVT prophylaxis: Heparin subQ Code Status:Full code.  Family Communication: Family not at bedside; I called patient's wife's cell phone and left  voicemail. I informed her that he will return to [facility] tomorrow. I also left my cell phone number for her to call me back if she has any questions or concerns.    Consultants:   none  Procedures:  none  Antimicrobials:   Ceftriaxone. SignedLeisa Lenz, MD  [facility] 12/19/2015, 11:34 AM  Pager #: [phone]  Time spent in minutes: more than 30 minutes  Discharge Exam: Filed Vitals:   12/19/15 0327 12/19/15 0916  BP: 130/83 158/87  Pulse: 73 85  Temp: 98.8 F (37.1 C) 98.3 F (36.8 C)  Resp: 18 18   Filed Vitals:   12/18/15 2343 12/19/15 0327 12/19/15 0916 12/19/15 1016  BP: 165/93 130/83 158/87   Pulse: 73 73 85   Temp: 99.1 F (37.3 C) 98.8 F (37.1 C) 98.3 F (36.8 C)   TempSrc: Oral Oral Oral   Resp: 18 18 18    Height:      Weight:      SpO2: 99% 98% 98% 96%    General: Pt is alert, follows commands appropriately, not in acute distress Cardiovascular: Regular rate and rhythm, S1/S2 + Respiratory: Clear to auscultation bilaterally, no wheezing, no crackles, no rhonchi Abdominal: Soft, non tender, non distended, bowel sounds +, no guarding Extremities: no edema, no cyanosis, pulses palpable bilaterally DP and PT Neuro: Grossly nonfocal  Discharge Instructions  Discharge Instructions    Call MD for:  difficulty breathing, headache or visual disturbances    Complete by:  As directed      Call MD for:  persistant nausea and vomiting    Complete by:  As directed  Call MD for:  redness, tenderness, or signs of infection (pain, swelling, redness, odor or green/yellow discharge around incision site)    Complete by:  As directed      Diet - low sodium heart healthy    Complete by:  As directed      Discharge instructions    Complete by:  As directed   Continue Ceftin for 5 days on discharge     Increase activity slowly    Complete by:  As directed             Medication List    STOP taking these medications         insulin lispro 100 UNIT/ML injection  Commonly known as:  HUMALOG     TOUJEO SOLOSTAR 300 UNIT/ML Sopn  Generic drug:  Insulin Glargine  Replaced by:  insulin glargine 100 UNIT/ML injection      TAKE these medications        albuterol (2.5 MG/3ML) 0.083% NEBU 3 mL, albuterol (5 MG/ML) 0.5% NEBU 0.5 mL  Inhale 5 mg into the lungs every 6 (six) hours as needed (wheezing).     albuterol 90 MCG/ACT inhaler  Commonly known as:  PROVENTIL,VENTOLIN  Inhale 2 puffs into the lungs every 4 (four) hours as needed for wheezing or shortness of breath.     bismuth subsalicylate 706 CB/76EG suspension  Commonly known as:  PEPTO BISMOL  Take 30 mLs by mouth every 6 (six) hours as needed for indigestion or diarrhea or loose stools.     cefUROXime 250 MG tablet  Commonly known as:  CEFTIN  Take 1 tablet (250 mg total) by mouth daily.     cholecalciferol 1000 units tablet  Commonly known as:  VITAMIN D  Take 1,000 Units by mouth daily.     DULERA 100-5 MCG/ACT Aero  Generic drug:  mometasone-formoterol  Inhale 2 puffs into the lungs 2 (two) times daily.     feeding supplement (GLUCERNA SHAKE) Liqd  Take 237 mLs by mouth 3 (three) times daily between meals.     insulin aspart 100 UNIT/ML injection  Commonly known as:  novoLOG  Inject 4 Units into the skin 3 (three) times daily with meals.     insulin glargine 100 UNIT/ML injection  Commonly known as:  LANTUS  Inject 0.08 mLs (8 Units total) into the skin at bedtime.     saccharomyces boulardii 250 MG capsule  Commonly known as:  FLORASTOR  Take 1 capsule (250 mg total) by mouth 2 (two) times daily.     simvastatin 40 MG tablet  Commonly known as:  ZOCOR  Take 40 mg by mouth daily.           Follow-up Information    Follow up with PERINI,MARK A, MD. Schedule an appointment as soon as possible for a visit in 1 week.   Specialty:  Internal Medicine   Why:  Follow up appt after recent hospitalization   Contact information:   [place] [id number] [phone]        The results of significant diagnostics from this hospitalization (including imaging, microbiology, ancillary and laboratory) are listed below for reference.    Significant Diagnostic Studies: Dg Chest 1 View 12/16/2015  1. Biapical pleural parenchymal scarring, left greater than right, but chronic. 2. Atherosclerotic and tortuous thoracic aorta. Heart size within normal limits. 3. Thoracic spondylosis. Electronically Signed   By: Van Clines M.D.   On: 12/16/2015 16:31  Microbiology: Recent Results (from the past 240 hour(s))  MRSA PCR Screening     Status: None   Collection Time: 12/16/15  3:11 PM  Result Value Ref Range Status   MRSA by PCR NEGATIVE NEGATIVE Final  Urine culture     Status: Abnormal   Collection Time: 12/16/15 10:00 PM  Result Value Ref Range Status   Specimen Description URINE, CATHETERIZED  Final   Special Requests NONE  Final   Culture (A)  Final    <10,000 COLONIES/mL INSIGNIFICANT GROWTH Performed at [facility]    Report Status 12/18/2015 FINAL  Final     Labs: Basic Metabolic Panel:  Recent Labs Lab 12/16/15 1514 12/16/15 1843 12/16/15 2239 12/17/15 0309 12/18/15 0510  NA 135 136 137 136 139  K 5.1 3.9 4.5 3.8 4.7  CL 106 107 107 109 107  CO2 15* 21* 19* 21* 24  GLUCOSE 389* 122* 229* 109* 163*  BUN 60* 58* 55* 49* 39*  CREATININE 2.88* 2.43* 2.47* 2.24* 1.76*  CALCIUM 8.4* 8.3* 8.2* 8.0* 8.6*   Liver Function Tests: No results for input(s): AST, ALT, ALKPHOS, BILITOT, PROT, ALBUMIN in the last 168 hours. No results for input(s): LIPASE, AMYLASE in the last 168 hours. No results for input(s): AMMONIA in the last 168 hours. CBC:  Recent Labs Lab 12/16/15 1110 12/18/15 0510  WBC 14.5* 6.8  HGB 11.8* 11.8*  HCT 38.5* 35.2*  MCV 100.8* 92.4  PLT 358 258   Cardiac Enzymes: No results for input(s): CKTOTAL, CKMB, CKMBINDEX, TROPONINI in the last 168  hours. BNP: BNP (last 3 results) No results for input(s): BNP in the last 8760 hours.  ProBNP (last 3 results) No results for input(s): PROBNP in the last 8760 hours.  CBG:  Recent Labs Lab 12/18/15 0720 12/18/15 1135 12/18/15 1647 12/18/15 2159 12/19/15 0725  GLUCAP 182* 71 318* 274* 96

## 2015-12-20 LAB — BASIC METABOLIC PANEL
ANION GAP: 8 (ref 5–15)
BUN: 37 mg/dL — ABNORMAL HIGH (ref 6–20)
CALCIUM: 8.6 mg/dL — AB (ref 8.9–10.3)
CO2: 23 mmol/L (ref 22–32)
CREATININE: 1.61 mg/dL — AB (ref 0.61–1.24)
Chloride: 103 mmol/L (ref 101–111)
GFR, EST AFRICAN AMERICAN: 43 mL/min — AB (ref >60)
GFR, EST NON AFRICAN AMERICAN: 37 mL/min — AB (ref >60)
Glucose, Bld: 274 mg/dL — ABNORMAL HIGH (ref 65–99)
Potassium: 4.6 mmol/L (ref 3.5–5.1)
SODIUM: 134 mmol/L — AB (ref 135–145)

## 2015-12-20 NOTE — Progress Notes (Signed)
Report called to Lurena JoinerRebecca at [facility].  Patient wife to be transporting patient.

## 2015-12-20 NOTE — Progress Notes (Signed)
Date:  December 20, 2015 Chart reviewed for concurrent status and case management needs. Will continue to follow the patient for changes and needs:  No needs at time of discharge Expected discharge date: [id number] Marcelle SmilingRhonda Davis, BSN, [place]RN3, [place]CCM   [phone]

## 2015-12-20 NOTE — Progress Notes (Signed)
LCSWA informed/updated facility, patient disposition return to [facility]. Clinicals sent.  Patient Spouse will transport patient to facility. Nurse informed with Report number. LCSWA signing off at this time.   Vivi BarrackNicole Luvenia Cranford, Theresia MajorsLCSWA, MSW Clinical Social Worker 5E and Psychiatric Service Line [phone] 12/20/2015  10:14 AM

## 2015-12-24 ENCOUNTER — Non-Acute Institutional Stay (SKILLED_NURSING_FACILITY): Payer: Medicare Other | Admitting: Internal Medicine

## 2015-12-24 ENCOUNTER — Encounter: Payer: Self-pay | Admitting: Internal Medicine

## 2015-12-24 DIAGNOSIS — E1010 Type 1 diabetes mellitus with ketoacidosis without coma: Secondary | ICD-10-CM | POA: Diagnosis not present

## 2015-12-24 DIAGNOSIS — W19XXXD Unspecified fall, subsequent encounter: Secondary | ICD-10-CM

## 2015-12-24 DIAGNOSIS — I5040 Unspecified combined systolic (congestive) and diastolic (congestive) heart failure: Secondary | ICD-10-CM | POA: Diagnosis not present

## 2015-12-24 DIAGNOSIS — N179 Acute kidney failure, unspecified: Secondary | ICD-10-CM

## 2015-12-24 DIAGNOSIS — N390 Urinary tract infection, site not specified: Secondary | ICD-10-CM

## 2015-12-24 DIAGNOSIS — N184 Chronic kidney disease, stage 4 (severe): Secondary | ICD-10-CM | POA: Diagnosis not present

## 2015-12-24 DIAGNOSIS — J449 Chronic obstructive pulmonary disease, unspecified: Secondary | ICD-10-CM | POA: Diagnosis not present

## 2015-12-24 DIAGNOSIS — E43 Unspecified severe protein-calorie malnutrition: Secondary | ICD-10-CM

## 2015-12-24 DIAGNOSIS — N320 Bladder-neck obstruction: Secondary | ICD-10-CM

## 2015-12-24 NOTE — Progress Notes (Signed)
Patient ID: Benjamin Riddle, male   DOB: 25-Aug-1927, 82 y.o.   MRN: [id number]  Provider:  Gwenith Spitziffany L. Renato Gailseed, D.O., C.M.D. [facility] Room Number: 148 Rehab Place of Service:  SNF (31)  PCP: Ezequiel KayserPERINI,MARK A, MD Patient Care Team: Rodrigo RanMark Perini, MD as PCP - General (Internal Medicine) Barron Alvineavid Grapey, MD as Consulting Physician (Urology) Hali MarryMichael Altheimer, MD as Consulting Physician (Endocrinology) Jethro BolusMark Shapiro, MD as Consulting Physician (Ophthalmology) Cammy CopaScott Gregory Dean, MD as Consulting Physician (Orthopedic Surgery) Sharrell KuJeffrey Medoff, MD as Consulting Physician (Gastroenterology) Peter M SwazilandJordan, MD as Consulting Physician (Cardiology) Merwyn Katosavid B Simonds, MD as Consulting Physician (Pulmonary Disease)  Extended Emergency Contact Information Primary Emergency Contact: Quickel,Ingrid [address]          [place] [id number] [place] Home Phone: [phone] Mobile Phone: [phone] Relation: Spouse  Code Status: Full code Goals of Care: Advanced Directive information Advanced Directives 12/24/2015  Does patient have an advance directive? No  Type of Advance Directive -  Copy of advanced directive(s) in chart? -  Pre-existing out of facility DNR order (yellow form or pink MOST form) -   Chief Complaint  Patient presents with  . New Admit To SNF    admit to rehab    HPI: Patient is a 82 y.o. male with h/o DMI, neurgenic bladder (does i/o caths seen today for admission to [facility] s/p his latest hospitalization for DKA 5/29-12/20/15.  Apparently, he fell on the floor getting out of the shower.  When [facility] nursing evaluated him, his glucose was over 600 so EMS was called and he was transported to the hospital.  There his stay was complicated by acute on chronic renal failure with cr bump to 2.2 from 1.7 but improved to baseline by discharge.  His COPD was stable.  He was also said to have a UTI and is to complete 5 more  days of ceftin from his arrival on 12/20/15.  His hyperkalemia improved with DKA treatment and fluids.  Prior to hospital admission, he'd been on 14 units of toujeo, 8/4-5/8 units of novolog with meals.  He'd been taking benadryl at home for itching and sleep, but this was stopped.  Here he scored 29/30 on his mmse.  He continues with I/O caths and has been convinced to do this an additional time per day (3) to help decreased prolonged retention that can increase risk of UTIs.  Staff had a concern about a black area on his toe, but he reports it has been ongoing and is not bothersome.  Past Medical History  Diagnosis Date  . Diabetes mellitus   . Asthma   . Hydronephrosis   . Kidney disease     Stage III chronic kidney disease  . Hypertension   . Neurogenic bladder   . Osteoporosis   . Hypogonadism male   . Recurrent urinary tract infection     With resistent Pseudomonas  . Allergic rhinitis   . Hepatitis C     due to blood transfusion  . Hyperlipidemia   . SOB (shortness of breath)   . CKD (chronic kidney disease), stage III   . Combined systolic and diastolic congestive heart failure (HCC)   . COPD (chronic obstructive pulmonary disease) (HCC)   . Allergy   . Cataract   . Heart murmur    Past Surgical History  Procedure Laterality Date  . Hernia repair    . Tonsillectomy    . Bladder repair  obstruction surgery  . Frontal sinus obliteration      sinus repaired with a plate  . Cataract extraction  2015  . Eye surgery    . Fracture surgery    . Prostate surgery    . Spine surgery      reports that he quit smoking about 4 years ago. His smoking use included Cigars and Cigarettes. He has never used smokeless tobacco. He reports that he does not drink alcohol or use illicit drugs. Social History   Social History  . Marital Status: Married    Spouse Name: Jerene Dilling  . Number of Children: 0  . Years of Education: MD   Occupational History  . Retired Other   Social  History Main Topics  . Smoking status: Former Smoker    Types: Cigars, Cigarettes    Quit date: 07/21/2011  . Smokeless tobacco: Never Used     Comment: smoked occ cigar and pipe  . Alcohol Use: No  . Drug Use: No  . Sexual Activity: Not on file   Other Topics Concern  . Not on file   Social History Narrative   Patient lives at home with spouse.   Caffeine Use: 3 cups daily     Family History  Problem Relation Age of Onset  . Heart failure Mother   . Hypertension Mother   . COPD Father     smoked  . Diabetes Father   . Cancer Sister     Breast  . Diabetes Sister   . COPD Brother     smoked    Health Maintenance  Topic Date Due  . FOOT EXAM  06/18/1940  . HEMOGLOBIN A1C  01/23/2016  . OPHTHALMOLOGY EXAM  01/28/2016  . INFLUENZA VACCINE  02/18/2016  . URINE MICROALBUMIN  07/25/2016  . TETANUS/TDAP  07/27/2021  . ZOSTAVAX  Completed  . PNA vac Low Risk Adult  Completed    Allergies  Allergen Reactions  . Aspirin   . Bactrim [Sulfamethoxazole-Trimethoprim]   . Codeine Nausea And Vomiting  . Gentamicin     Becomes dizzy and weak  . Lexapro [Escitalopram Oxalate]   . Protonix [Pantoprazole Sodium]   . Ciprofloxacin Itching and Rash      Medication List       This list is accurate as of: 12/24/15 12:04 PM.  Always use your most recent med list.               albuterol (2.5 MG/3ML) 0.083% NEBU 3 mL, albuterol (5 MG/ML) 0.5% NEBU 0.5 mL  Inhale 5 mg into the lungs every 6 (six) hours as needed (wheezing).     albuterol 90 MCG/ACT inhaler  Commonly known as:  PROVENTIL,VENTOLIN  Inhale 2 puffs into the lungs every 4 (four) hours as needed for wheezing or shortness of breath.     bismuth subsalicylate 262 MG/15ML suspension  Commonly known as:  PEPTO BISMOL  Take 30 mLs by mouth every 6 (six) hours as needed for indigestion or diarrhea or loose stools.     cholecalciferol 1000 units tablet  Commonly known as:  VITAMIN D  Take 1,000 Units by mouth  daily.     DULERA 100-5 MCG/ACT Aero  Generic drug:  mometasone-formoterol  Inhale 2 puffs into the lungs 2 (two) times daily.     feeding supplement (GLUCERNA SHAKE) Liqd  Take 237 mLs by mouth 3 (three) times daily between meals.     insulin aspart 100 UNIT/ML injection  Commonly known as:  novoLOG  Inject 6 Units into the skin 3 (three) times daily before meals.     saccharomyces boulardii 250 MG capsule  Commonly known as:  FLORASTOR  Take 1 capsule (250 mg total) by mouth 2 (two) times daily.     simvastatin 40 MG tablet  Commonly known as:  ZOCOR  Take 40 mg by mouth daily.     TOUJEO SOLOSTAR 300 UNIT/ML Sopn  Generic drug:  Insulin Glargine  Inject 12 Units into the skin daily.        Review of Systems  Constitutional: Positive for fatigue. Negative for fever, chills, activity change and appetite change.       Weakness  HENT: Negative for congestion and sore throat.   Eyes: Negative for visual disturbance.       Glasses  Respiratory: Positive for cough. Negative for chest tightness and shortness of breath.   Cardiovascular: Negative for chest pain, palpitations and leg swelling.  Gastrointestinal: Positive for blood in stool. Negative for nausea, vomiting and abdominal pain.       Hemorrhoids  Genitourinary: Positive for difficulty urinating. Negative for dysuria, urgency and frequency.       Neurogenic bladder with retention  Musculoskeletal: Negative for arthralgias.  Skin: Negative for color change.  Allergic/Immunologic:       Diabetic  Neurological: Positive for weakness. Negative for dizziness.  Hematological: Negative for adenopathy.  Psychiatric/Behavioral: Negative for confusion and agitation.    Filed Vitals:   12/24/15 1118  BP: 117/67  Pulse: 78  Temp: 98.4 F (36.9 C)  TempSrc: Oral  Weight: 128 lb (58.06 kg)  SpO2: 97%   Body mass index is 18.89 kg/(m^2). Physical Exam  Constitutional: He is oriented to person, place, and time. No  distress.  HENT:  Head: Normocephalic.  Right Ear: External ear normal.  Left Ear: External ear normal.  Nose: Nose normal.  Eyes: Conjunctivae and EOM are normal. Pupils are equal, round, and reactive to light.  Neck: Normal range of motion. Neck supple. No JVD present. No thyromegaly present.  Cardiovascular: Normal rate, regular rhythm, normal heart sounds and intact distal pulses.   Pulmonary/Chest: Effort normal. He has wheezes.  Abdominal: Soft. Bowel sounds are normal. He exhibits distension. He exhibits no mass. There is no tenderness. There is no rebound and no guarding.  Musculoskeletal: Normal range of motion. He exhibits no edema or tenderness.  Neurological: He is alert and oriented to person, place, and time.  Skin: Skin is warm.  Dark area on great toe  Psychiatric:  Flat affect    Labs reviewed: Basic Metabolic Panel:  Recent Labs  69/62/95 1653  12/17/15 0309 12/18/15 0510 12/20/15 0515  NA 136  < > 136 139 134*  K 4.9  < > 3.8 4.7 4.6  CL 105  < > 109 107 103  CO2 20*  < > 21* 24 23  GLUCOSE 474*  < > 109* 163* 274*  BUN 50*  < > 49* 39* 37*  CREATININE 2.07*  < > 2.24* 1.76* 1.61*  CALCIUM 8.1*  < > 8.0* 8.6* 8.6*  MG 2.1  --   --   --   --   PHOS 3.3  --   --   --   --   < > = values in this interval not displayed. Liver Function Tests:  Recent Labs  06/18/15 1635 06/20/15 0446 11/06/15 2358  AST 50* 22 29  ALT 18 14* 18  ALKPHOS 51 39 69  BILITOT 0.6 0.1* 0.5  PROT 7.6 5.4* 7.5  ALBUMIN 3.9 2.5* 4.0    Recent Labs  01/04/15 1551 04/06/15 1745  LIPASE 35 41   No results for input(s): AMMONIA in the last 8760 hours. CBC:  Recent Labs  04/06/15 1745  06/18/15 1635  11/06/15 2358  11/09/15 0354 11/20/15 12/16/15 1110 12/18/15 0510  WBC 11.7*  < > 4.7  < > 10.0  < > 5.7 13.7 14.5* 6.8  NEUTROABS 9.4*  --  2.3  --  7.3  --   --   --   --   --   HGB 11.2*  < > 13.1  < > 13.3  < > 11.2* 12.3* 11.8* 11.8*  HCT 34.4*  < > 39.5  < >  38.2*  < > 33.2* 40* 38.5* 35.2*  MCV 90.5  < > 94.5  < > 89.7  < > 91.5  --  100.8* 92.4  PLT 398  < > 357  < > 327  < > 318 551* 358 258  < > = values in this interval not displayed. Cardiac Enzymes: No results for input(s): CKTOTAL, CKMB, CKMBINDEX, TROPONINI in the last 8760 hours. BNP: Invalid input(s): POCBNP Lab Results  Component Value Date   HGBA1C 9.5 07/26/2015   Lab Results  Component Value Date   TSH 1.241 06/21/2015   No results found for: VITAMINB12 No results found for: FOLATE No results found for: IRON, TIBC, FERRITIN  Imaging and Procedures obtained prior to SNF admission: Dg Chest 1 View  12/16/2015  CLINICAL DATA:  Hyperglycemia.  COPD.  Hypertension. EXAM: CHEST 1 VIEW COMPARISON:  11/07/2015 FINDINGS: Atherosclerotic and tortuous thoracic aorta. The patient is rotated to the right on today's radiograph, reducing diagnostic sensitivity and specificity. There is biapical pleural parenchymal scarring with asymmetrically prominent involvement of the left lung apex, but this is chronic from 2009 enhance unlikely to be due to malignancy. From 2009, there may be slight increase in the inferolateral component of this process on the left. Thoracic spondylosis. Degenerative glenohumeral arthropathy, right greater than left. Heart size within normal limits. IMPRESSION: 1. Biapical pleural parenchymal scarring, left greater than right, but chronic. 2. Atherosclerotic and tortuous thoracic aorta. Heart size within normal limits. 3. Thoracic spondylosis. Electronically Signed   By: Gaylyn Rong M.D.   On: 12/16/2015 16:31    Assessment/Plan 1. Diabetic ketoacidosis without coma associated with type 1 diabetes mellitus (HCC) -apparently his wife was about to give his insulin when he fell this time as she has been helping to manage this and making sure he is taking his meds as directed  -this episode is resolved -there are questions about his ability to manage his medications  though he denies any problems (his wife is highly stressed about his care at home per nursing staff report)  2. Uncontrolled type 1 diabetes mellitus with ketoacidosis without coma (HCC) -per Dr. Laurey Morale notes, NOT type 2 -stil having high sugars here--currently on 12 units toujeo daily and 6 units novolog before meals  -is on statin therapy  3. Acute renal failure superimposed on stage 4 chronic kidney disease (HCC) -resolved with DKA treatment and hydration -cont to avoid nephrotoxic agents and dose adjust as needed for CKD  4. Bladder outlet obstruction -cont I/O cath now tid to help prevent recurrent UTIs--complete ceftin for this time, also hydrate well  5. Chronic UTI -? Infection each time--suspect colonization getting treated b/c he goes to the hospital for his DKA spells  and also was not cathing often enough -push fluids  6. COPD GOLD III  -stable, cont same regimen--dulera and albuterol nebs/inhaler prn  7. Combined systolic and diastolic congestive heart failure, unspecified congestive heart failure chronicity (HCC) -stable, no signs of overload after hydration for dka  8. Protein-calorie malnutrition, severe (HCC) -appears unhealthy and poorly nourished, but does eat well here in rehab per staff  9. Fall, subsequent encounter -here for rehab due to generalized weakness with his poor diabetic control  Family/ staff Communication: discussed with rehab nurse  Labs/tests ordered:  F/u cbgs after insulin changes

## 2016-01-19 DIAGNOSIS — E1010 Type 1 diabetes mellitus with ketoacidosis without coma: Secondary | ICD-10-CM | POA: Insufficient documentation

## 2016-01-27 ENCOUNTER — Inpatient Hospital Stay (HOSPITAL_COMMUNITY)
Admission: EM | Admit: 2016-01-27 | Discharge: 2016-01-30 | DRG: 637 | Disposition: A | Discharge status: Skilled Nursing Facility | Payer: Medicare Other | Attending: Internal Medicine | Admitting: Internal Medicine

## 2016-01-27 ENCOUNTER — Encounter (HOSPITAL_COMMUNITY): Payer: Self-pay | Admitting: Emergency Medicine

## 2016-01-27 ENCOUNTER — Emergency Department (HOSPITAL_COMMUNITY): Payer: Medicare Other

## 2016-01-27 DIAGNOSIS — N390 Urinary tract infection, site not specified: Secondary | ICD-10-CM | POA: Diagnosis present

## 2016-01-27 DIAGNOSIS — E1310 Other specified diabetes mellitus with ketoacidosis without coma: Secondary | ICD-10-CM | POA: Diagnosis present

## 2016-01-27 DIAGNOSIS — E0810 Diabetes mellitus due to underlying condition with ketoacidosis without coma: Secondary | ICD-10-CM | POA: Diagnosis not present

## 2016-01-27 DIAGNOSIS — E872 Acidosis, unspecified: Secondary | ICD-10-CM | POA: Diagnosis present

## 2016-01-27 DIAGNOSIS — Z681 Body mass index (BMI) 19 or less, adult: Secondary | ICD-10-CM | POA: Diagnosis not present

## 2016-01-27 DIAGNOSIS — Z881 Allergy status to other antibiotic agents status: Secondary | ICD-10-CM | POA: Diagnosis not present

## 2016-01-27 DIAGNOSIS — Z833 Family history of diabetes mellitus: Secondary | ICD-10-CM

## 2016-01-27 DIAGNOSIS — Z8249 Family history of ischemic heart disease and other diseases of the circulatory system: Secondary | ICD-10-CM

## 2016-01-27 DIAGNOSIS — I5042 Chronic combined systolic (congestive) and diastolic (congestive) heart failure: Secondary | ICD-10-CM | POA: Diagnosis present

## 2016-01-27 DIAGNOSIS — R338 Other retention of urine: Secondary | ICD-10-CM | POA: Diagnosis present

## 2016-01-27 DIAGNOSIS — N320 Bladder-neck obstruction: Secondary | ICD-10-CM | POA: Diagnosis present

## 2016-01-27 DIAGNOSIS — Z79899 Other long term (current) drug therapy: Secondary | ICD-10-CM

## 2016-01-27 DIAGNOSIS — Z87440 Personal history of urinary (tract) infections: Secondary | ICD-10-CM

## 2016-01-27 DIAGNOSIS — J449 Chronic obstructive pulmonary disease, unspecified: Secondary | ICD-10-CM | POA: Diagnosis present

## 2016-01-27 DIAGNOSIS — E785 Hyperlipidemia, unspecified: Secondary | ICD-10-CM | POA: Diagnosis present

## 2016-01-27 DIAGNOSIS — J069 Acute upper respiratory infection, unspecified: Secondary | ICD-10-CM | POA: Diagnosis present

## 2016-01-27 DIAGNOSIS — E43 Unspecified severe protein-calorie malnutrition: Secondary | ICD-10-CM | POA: Diagnosis present

## 2016-01-27 DIAGNOSIS — E1110 Type 2 diabetes mellitus with ketoacidosis without coma: Secondary | ICD-10-CM | POA: Diagnosis present

## 2016-01-27 DIAGNOSIS — I5040 Unspecified combined systolic (congestive) and diastolic (congestive) heart failure: Secondary | ICD-10-CM | POA: Diagnosis present

## 2016-01-27 DIAGNOSIS — N179 Acute kidney failure, unspecified: Secondary | ICD-10-CM | POA: Diagnosis present

## 2016-01-27 DIAGNOSIS — Z882 Allergy status to sulfonamides status: Secondary | ICD-10-CM

## 2016-01-27 DIAGNOSIS — E291 Testicular hypofunction: Secondary | ICD-10-CM | POA: Diagnosis present

## 2016-01-27 DIAGNOSIS — N319 Neuromuscular dysfunction of bladder, unspecified: Secondary | ICD-10-CM | POA: Diagnosis present

## 2016-01-27 DIAGNOSIS — N184 Chronic kidney disease, stage 4 (severe): Secondary | ICD-10-CM | POA: Diagnosis present

## 2016-01-27 DIAGNOSIS — M810 Age-related osteoporosis without current pathological fracture: Secondary | ICD-10-CM | POA: Diagnosis present

## 2016-01-27 DIAGNOSIS — E875 Hyperkalemia: Secondary | ICD-10-CM | POA: Diagnosis present

## 2016-01-27 DIAGNOSIS — Z888 Allergy status to other drugs, medicaments and biological substances status: Secondary | ICD-10-CM | POA: Diagnosis not present

## 2016-01-27 DIAGNOSIS — R64 Cachexia: Secondary | ICD-10-CM | POA: Diagnosis present

## 2016-01-27 DIAGNOSIS — Z87891 Personal history of nicotine dependence: Secondary | ICD-10-CM | POA: Diagnosis not present

## 2016-01-27 DIAGNOSIS — Z7951 Long term (current) use of inhaled steroids: Secondary | ICD-10-CM

## 2016-01-27 DIAGNOSIS — R739 Hyperglycemia, unspecified: Secondary | ICD-10-CM

## 2016-01-27 DIAGNOSIS — I130 Hypertensive heart and chronic kidney disease with heart failure and stage 1 through stage 4 chronic kidney disease, or unspecified chronic kidney disease: Secondary | ICD-10-CM | POA: Diagnosis present

## 2016-01-27 DIAGNOSIS — R531 Weakness: Secondary | ICD-10-CM | POA: Diagnosis not present

## 2016-01-27 DIAGNOSIS — Z825 Family history of asthma and other chronic lower respiratory diseases: Secondary | ICD-10-CM | POA: Diagnosis not present

## 2016-01-27 DIAGNOSIS — Z886 Allergy status to analgesic agent status: Secondary | ICD-10-CM

## 2016-01-27 DIAGNOSIS — J309 Allergic rhinitis, unspecified: Secondary | ICD-10-CM | POA: Diagnosis present

## 2016-01-27 DIAGNOSIS — Z885 Allergy status to narcotic agent status: Secondary | ICD-10-CM

## 2016-01-27 DIAGNOSIS — Z794 Long term (current) use of insulin: Secondary | ICD-10-CM

## 2016-01-27 LAB — COMPREHENSIVE METABOLIC PANEL
ALBUMIN: 3.5 g/dL (ref 3.5–5.0)
ALT: 40 U/L (ref 17–63)
AST: 42 U/L — AB (ref 15–41)
Alkaline Phosphatase: 85 U/L (ref 38–126)
Anion gap: 15 (ref 5–15)
BUN: 73 mg/dL — ABNORMAL HIGH (ref 6–20)
CHLORIDE: 101 mmol/L (ref 101–111)
CO2: 17 mmol/L — AB (ref 22–32)
CREATININE: 2.66 mg/dL — AB (ref 0.61–1.24)
Calcium: 9.1 mg/dL (ref 8.9–10.3)
GFR calc non Af Amer: 20 mL/min — ABNORMAL LOW (ref >60)
GFR, EST AFRICAN AMERICAN: 24 mL/min — AB (ref >60)
GLUCOSE: 317 mg/dL — AB (ref 65–99)
Potassium: 5.3 mmol/L — ABNORMAL HIGH (ref 3.5–5.1)
SODIUM: 133 mmol/L — AB (ref 135–145)
Total Bilirubin: 1.3 mg/dL — ABNORMAL HIGH (ref 0.3–1.2)
Total Protein: 6.4 g/dL — ABNORMAL LOW (ref 6.5–8.1)

## 2016-01-27 LAB — BASIC METABOLIC PANEL
ANION GAP: 18 — AB (ref 5–15)
ANION GAP: 11 (ref 5–15)
Anion gap: 20 — ABNORMAL HIGH (ref 5–15)
BUN: 73 mg/dL — AB (ref 6–20)
BUN: 69 mg/dL — ABNORMAL HIGH (ref 6–20)
BUN: 63 mg/dL — ABNORMAL HIGH (ref 6–20)
CALCIUM: 8.7 mg/dL — AB (ref 8.9–10.3)
CALCIUM: 8.7 mg/dL — AB (ref 8.9–10.3)
CHLORIDE: 98 mmol/L — AB (ref 101–111)
CO2: 13 mmol/L — AB (ref 22–32)
CO2: 12 mmol/L — AB (ref 22–32)
CO2: 19 mmol/L — ABNORMAL LOW (ref 22–32)
Calcium: 8.9 mg/dL (ref 8.9–10.3)
Chloride: 104 mmol/L (ref 101–111)
Chloride: 108 mmol/L (ref 101–111)
Creatinine, Ser: 2.75 mg/dL — ABNORMAL HIGH (ref 0.61–1.24)
Creatinine, Ser: 2.79 mg/dL — ABNORMAL HIGH (ref 0.61–1.24)
Creatinine, Ser: 2.39 mg/dL — ABNORMAL HIGH (ref 0.61–1.24)
GFR calc non Af Amer: 20 mL/min — ABNORMAL LOW (ref >60)
GFR, EST AFRICAN AMERICAN: 23 mL/min — AB (ref >60)
GFR, EST AFRICAN AMERICAN: 22 mL/min — AB (ref >60)
GFR, EST AFRICAN AMERICAN: 27 mL/min — AB (ref >60)
GFR, EST NON AFRICAN AMERICAN: 19 mL/min — AB (ref >60)
GFR, EST NON AFRICAN AMERICAN: 23 mL/min — AB (ref >60)
Glucose, Bld: 580 mg/dL (ref 65–99)
Glucose, Bld: 463 mg/dL — ABNORMAL HIGH (ref 65–99)
Glucose, Bld: 144 mg/dL — ABNORMAL HIGH (ref 65–99)
POTASSIUM: 6.1 mmol/L — AB (ref 3.5–5.1)
Potassium: 4.5 mmol/L (ref 3.5–5.1)
Potassium: 3.9 mmol/L (ref 3.5–5.1)
SODIUM: 131 mmol/L — AB (ref 135–145)
Sodium: 134 mmol/L — ABNORMAL LOW (ref 135–145)
Sodium: 138 mmol/L (ref 135–145)

## 2016-01-27 LAB — CBC WITH DIFFERENTIAL/PLATELET
BASOS ABS: 0 10*3/uL (ref 0.0–0.1)
BASOS PCT: 0 %
EOS ABS: 0.1 10*3/uL (ref 0.0–0.7)
EOS PCT: 1 %
HCT: 34.7 % — ABNORMAL LOW (ref 39.0–52.0)
HEMOGLOBIN: 12.2 g/dL — AB (ref 13.0–17.0)
Lymphocytes Relative: 16 %
Lymphs Abs: 1.1 10*3/uL (ref 0.7–4.0)
MCH: 31.4 pg (ref 26.0–34.0)
MCHC: 35.2 g/dL (ref 30.0–36.0)
MCV: 89.4 fL (ref 78.0–100.0)
Monocytes Absolute: 0.9 10*3/uL (ref 0.1–1.0)
Monocytes Relative: 13 %
NEUTROS PCT: 70 %
Neutro Abs: 4.7 10*3/uL (ref 1.7–7.7)
PLATELETS: 316 10*3/uL (ref 150–400)
RBC: 3.88 MIL/uL — AB (ref 4.22–5.81)
RDW: 14.4 % (ref 11.5–15.5)
WBC: 6.8 10*3/uL (ref 4.0–10.5)

## 2016-01-27 LAB — I-STAT CG4 LACTIC ACID, ED
LACTIC ACID, VENOUS: 1.45 mmol/L (ref 0.5–1.9)
Lactic Acid, Venous: 1.93 mmol/L (ref 0.5–1.9)

## 2016-01-27 LAB — URINALYSIS, ROUTINE W REFLEX MICROSCOPIC
BILIRUBIN URINE: NEGATIVE
Ketones, ur: 15 mg/dL — AB
NITRITE: NEGATIVE
PH: 6 (ref 5.0–8.0)
Protein, ur: 100 mg/dL — AB
SPECIFIC GRAVITY, URINE: 1.013 (ref 1.005–1.030)

## 2016-01-27 LAB — GLUCOSE, CAPILLARY
GLUCOSE-CAPILLARY: 560 mg/dL — AB (ref 65–99)
Glucose-Capillary: 465 mg/dL — ABNORMAL HIGH (ref 65–99)
Glucose-Capillary: 355 mg/dL — ABNORMAL HIGH (ref 65–99)
Glucose-Capillary: 257 mg/dL — ABNORMAL HIGH (ref 65–99)
Glucose-Capillary: 204 mg/dL — ABNORMAL HIGH (ref 65–99)

## 2016-01-27 LAB — MRSA PCR SCREENING: MRSA BY PCR: NEGATIVE

## 2016-01-27 LAB — I-STAT TROPONIN, ED: TROPONIN I, POC: 0 ng/mL (ref 0.00–0.08)

## 2016-01-27 LAB — URINE MICROSCOPIC-ADD ON

## 2016-01-27 LAB — CBG MONITORING, ED: Glucose-Capillary: 257 mg/dL — ABNORMAL HIGH (ref 65–99)

## 2016-01-27 MED ORDER — ALBUTEROL SULFATE (2.5 MG/3ML) 0.083% IN NEBU: 2.5000 mg | INHALATION_SOLUTION | Freq: Four times a day (QID) | RESPIRATORY_TRACT | Status: DC | PRN

## 2016-01-27 MED ORDER — ONDANSETRON HCL 4 MG PO TABS: 4.0000 mg | ORAL_TABLET | Freq: Four times a day (QID) | ORAL | Status: DC | PRN

## 2016-01-27 MED ORDER — SODIUM CHLORIDE 0.9 % IV SOLN
INTRAVENOUS | Status: DC
  Administered 2016-01-27: 17:00:00 via INTRAVENOUS

## 2016-01-27 MED ORDER — VITAMIN D3 25 MCG (1000 UNIT) PO TABS
1000.0000 [IU] | ORAL_TABLET | Freq: Every day | ORAL | Status: DC
  Administered 2016-01-28 – 2016-01-29 (×2): 1000 [IU] via ORAL
  Filled 2016-01-27 (×2): qty 1

## 2016-01-27 MED ORDER — INSULIN ASPART 100 UNIT/ML ~~LOC~~ SOLN: 0.0000 [IU] | Freq: Three times a day (TID) | SUBCUTANEOUS | Status: DC

## 2016-01-27 MED ORDER — MOMETASONE FURO-FORMOTEROL FUM 100-5 MCG/ACT IN AERO
2.0000 | INHALATION_SPRAY | Freq: Two times a day (BID) | RESPIRATORY_TRACT | Status: DC
  Administered 2016-01-27 – 2016-01-30 (×6): 2 via RESPIRATORY_TRACT
  Filled 2016-01-27: qty 8.8

## 2016-01-27 MED ORDER — SODIUM POLYSTYRENE SULFONATE 15 GM/60ML PO SUSP
30.0000 g | Freq: Once | ORAL | Status: AC
  Administered 2016-01-27: 30 g via ORAL
  Filled 2016-01-27: qty 120

## 2016-01-27 MED ORDER — GLUCERNA SHAKE PO LIQD
237.0000 mL | Freq: Three times a day (TID) | ORAL | Status: DC
  Administered 2016-01-28 – 2016-01-29 (×6): 237 mL via ORAL
  Filled 2016-01-27 (×10): qty 237

## 2016-01-27 MED ORDER — DEXTROSE 5 % IV SOLN
1.0000 g | INTRAVENOUS | Status: DC
  Administered 2016-01-27 – 2016-01-29 (×3): 1 g via INTRAVENOUS
  Filled 2016-01-27 (×4): qty 10

## 2016-01-27 MED ORDER — SODIUM CHLORIDE 0.9 % IV BOLUS (SEPSIS)
500.0000 mL | Freq: Once | INTRAVENOUS | Status: AC
  Administered 2016-01-27: 500 mL via INTRAVENOUS

## 2016-01-27 MED ORDER — SODIUM CHLORIDE 0.9 % IV SOLN
INTRAVENOUS | Status: DC
  Administered 2016-01-27: 5.2 [IU]/h via INTRAVENOUS
  Filled 2016-01-27: qty 2.5

## 2016-01-27 MED ORDER — ACETAMINOPHEN 650 MG RE SUPP
650.0000 mg | Freq: Four times a day (QID) | RECTAL | Status: DC | PRN
Start: 2016-01-27 — End: 2016-01-28

## 2016-01-27 MED ORDER — ONDANSETRON HCL 4 MG/2ML IJ SOLN: 4.0000 mg | Freq: Four times a day (QID) | INTRAMUSCULAR | Status: DC | PRN

## 2016-01-27 MED ORDER — BISMUTH SUBSALICYLATE 262 MG PO CHEW
262.0000 mg | CHEWABLE_TABLET | Freq: Two times a day (BID) | ORAL | Status: DC | PRN
  Filled 2016-01-27: qty 1

## 2016-01-27 MED ORDER — ALBUTEROL 90 MCG/ACT IN AERS: 2.0000 | INHALATION_SPRAY | RESPIRATORY_TRACT | Status: DC | PRN

## 2016-01-27 MED ORDER — INSULIN GLARGINE 100 UNIT/ML ~~LOC~~ SOLN
12.0000 [IU] | Freq: Every day | SUBCUTANEOUS | Status: DC
  Filled 2016-01-27: qty 0.12

## 2016-01-27 MED ORDER — SODIUM CHLORIDE 0.9 % IV SOLN
1.0000 g | Freq: Once | INTRAVENOUS | Status: AC
  Administered 2016-01-27: 1 g via INTRAVENOUS
  Filled 2016-01-27: qty 10

## 2016-01-27 MED ORDER — SODIUM CHLORIDE 0.9 % IV SOLN
INTRAVENOUS | Status: DC
  Administered 2016-01-27: 16:00:00 via INTRAVENOUS

## 2016-01-27 MED ORDER — ALBUTEROL SULFATE (2.5 MG/3ML) 0.083% IN NEBU: 5.0000 mg | INHALATION_SOLUTION | Freq: Four times a day (QID) | RESPIRATORY_TRACT | Status: DC | PRN

## 2016-01-27 MED ORDER — SODIUM CHLORIDE 0.9 % IV BOLUS (SEPSIS)
500.0000 mL | Freq: Once | INTRAVENOUS | Status: AC
Start: 2016-01-27 — End: 2016-01-27
  Administered 2016-01-27: 500 mL via INTRAVENOUS

## 2016-01-27 MED ORDER — DEXTROSE-NACL 5-0.45 % IV SOLN
INTRAVENOUS | Status: DC
  Administered 2016-01-27: 22:00:00 via INTRAVENOUS

## 2016-01-27 MED ORDER — HEPARIN SODIUM (PORCINE) 5000 UNIT/ML IJ SOLN
5000.0000 [IU] | Freq: Three times a day (TID) | INTRAMUSCULAR | Status: DC
  Administered 2016-01-27 – 2016-01-30 (×8): 5000 [IU] via SUBCUTANEOUS
  Filled 2016-01-27 (×7): qty 1

## 2016-01-27 MED ORDER — SODIUM CHLORIDE 0.9 % IV SOLN
INTRAVENOUS | Status: DC
  Administered 2016-01-27: 15:00:00 via INTRAVENOUS

## 2016-01-27 MED ORDER — SACCHAROMYCES BOULARDII 250 MG PO CAPS
250.0000 mg | ORAL_CAPSULE | Freq: Every day | ORAL | Status: DC
  Administered 2016-01-27 – 2016-01-29 (×3): 250 mg via ORAL
  Filled 2016-01-27 (×3): qty 1

## 2016-01-27 MED ORDER — ACETAMINOPHEN 325 MG PO TABS: 650.0000 mg | ORAL_TABLET | Freq: Four times a day (QID) | ORAL | Status: DC | PRN

## 2016-01-27 NOTE — ED Notes (Signed)
Bed: [id number] Expected date: 01/27/16 Expected time: 8:30 AM Means of arrival: Ambulance Comments: EMS Hyperglycemia

## 2016-01-27 NOTE — ED Notes (Signed)
Per EMS [facility] for [facility] for the past 4-6 days. CBG 353 with EMS and 400 ml NS given en route. Pt states weight loss of about 20 lbs in about a month. According to staff and wife pt is non compliant with diabetic diet. Pt is on antibiotics prophylactic for self cathing.

## 2016-01-27 NOTE — ED Provider Notes (Signed)
CSN: [id number]     Arrival date & time 01/27/16  0840 History   First MD Initiated Contact with Patient 01/27/16 [phone]     Chief Complaint  Patient presents with  . Hyperglycemia     (Consider location/radiation/quality/duration/timing/severity/associated sxs/prior Treatment) HPI Comments: 82 year old male with history of diabetes mellitus, COPD, previous DKA presents for elevated blood sugar and weakness. The patient reports that he has not been feeling well over the last few days. He said his glucose has been hard to control. His wife came bedside and was a much better historian. She reports that over the week and the patient's blood sugar was running over 700 and that she has been increasing his insulin to try to control it better. The patient has also had a decreased appetite and has been nauseous. He straight caths himself twice a day secondary to bladder outlet obstruction. His urine output has not changed. He has continued to straight cath himself as usual. Denies any abdominal or back pain. No cough, shortness of breath, chest pain.   Past Medical History  Diagnosis Date  . Diabetes mellitus   . Asthma   . Hydronephrosis   . Kidney disease     Stage III chronic kidney disease  . Hypertension   . Neurogenic bladder   . Osteoporosis   . Hypogonadism male   . Recurrent urinary tract infection     With resistent Pseudomonas  . Allergic rhinitis   . Hepatitis C     due to blood transfusion  . Hyperlipidemia   . SOB (shortness of breath)   . CKD (chronic kidney disease), stage III   . Combined systolic and diastolic congestive heart failure (HCC)   . COPD (chronic obstructive pulmonary disease) (HCC)   . Allergy   . Cataract   . Heart murmur    Past Surgical History  Procedure Laterality Date  . Hernia repair    . Tonsillectomy    . Bladder repair      obstruction surgery  . Frontal sinus obliteration      sinus repaired with a plate  . Cataract extraction  2015  .  Eye surgery    . Fracture surgery    . Prostate surgery    . Spine surgery     Family History  Problem Relation Age of Onset  . Heart failure Mother   . Hypertension Mother   . COPD Father     smoked  . Diabetes Father   . Cancer Sister     Breast  . Diabetes Sister   . COPD Brother     smoked   Social History  Substance Use Topics  . Smoking status: Former Smoker    Types: Cigars, Cigarettes    Quit date: 07/21/2011  . Smokeless tobacco: Never Used     Comment: smoked occ cigar and pipe  . Alcohol Use: No    Review of Systems  Constitutional: Negative for fever, chills, appetite change and fatigue.  HENT: Negative for congestion, rhinorrhea and sinus pressure.   Eyes: Negative for visual disturbance.  Respiratory: Negative for chest tightness and shortness of breath.   Cardiovascular: Negative for chest pain and palpitations.  Gastrointestinal: Negative for nausea, vomiting, abdominal pain and diarrhea.  Genitourinary: Negative for dysuria, urgency and hematuria.  Musculoskeletal: Negative for myalgias and back pain.  Skin: Negative for rash.  Neurological: Positive for weakness (generalized). Negative for dizziness, light-headedness and headaches.  Hematological: Does not bruise/bleed easily.  Allergies  Aspirin; Bactrim; Codeine; Gentamicin; Lexapro; Protonix; and Ciprofloxacin  Home Medications   Prior to Admission medications   Medication Sig Start Date End Date Taking? Authorizing Provider  albuterol (2.5 MG/3ML) 0.083% NEBU 3 mL, albuterol (5 MG/ML) 0.5% NEBU 0.5 mL Inhale 5 mg into the lungs every 6 (six) hours as needed (wheezing).   Yes Historical Provider, MD  albuterol (PROVENTIL,VENTOLIN) 90 MCG/ACT inhaler Inhale 2 puffs into the lungs every 4 (four) hours as needed for wheezing or shortness of breath.    Yes Historical Provider, MD  bismuth subsalicylate (PEPTO BISMOL) 262 MG chewable tablet Chew 262 mg by mouth 2 (two) times daily as needed for  diarrhea or loose stools.   Yes Historical Provider, MD  cephALEXin (KEFLEX) 250 MG capsule Take 250 mg by mouth daily. 01/08/16  Yes Historical Provider, MD  cholecalciferol (VITAMIN D) 1000 UNITS tablet Take 1,000 Units by mouth daily.   Yes Historical Provider, MD  feeding supplement, GLUCERNA SHAKE, (GLUCERNA SHAKE) LIQD Take 237 mLs by mouth 3 (three) times daily between meals. 12/19/15  Yes Alison MurrayAlma M Devine, MD  insulin aspart (NOVOLOG) 100 UNIT/ML injection Inject 8-20 Units into the skin 3 (three) times daily before meals. If bs is >150 inject add 1 unit every 50   Yes Historical Provider, MD  Insulin Glargine (TOUJEO SOLOSTAR) 300 UNIT/ML SOPN Inject 12-17 Units into the skin at bedtime.    Yes Historical Provider, MD  mometasone-formoterol (DULERA) 100-5 MCG/ACT AERO Inhale 2 puffs into the lungs 2 (two) times daily.   Yes Historical Provider, MD  saccharomyces boulardii (FLORASTOR) 250 MG capsule Take 1 capsule (250 mg total) by mouth 2 (two) times daily. Patient taking differently: Take 250 mg by mouth daily.  01/26/15  Yes Alison MurrayAlma M Devine, MD  simvastatin (ZOCOR) 40 MG tablet Take 40 mg by mouth daily.     Yes Historical Provider, MD   BP 130/69 mmHg  Pulse 75  Temp(Src) 97.5 F (36.4 C) (Oral)  Resp 17  SpO2 100% Physical Exam  Constitutional: He is oriented to person, place, and time. He appears well-developed. No distress.  Thin  HENT:  Head: Normocephalic and atraumatic.  Right Ear: External ear normal.  Left Ear: External ear normal.  Mouth/Throat: Oropharynx is clear and moist. No oropharyngeal exudate.  Eyes: EOM are normal. Pupils are equal, round, and reactive to light.  Neck: Normal range of motion. Neck supple.  Cardiovascular: Normal rate, regular rhythm and intact distal pulses.   Murmur heard. Pulmonary/Chest: Effort normal. No respiratory distress. He has no wheezes. He has no rales.  Abdominal: Soft. He exhibits no distension. There is no tenderness.   Musculoskeletal: He exhibits no edema.  Neurological: He is alert and oriented to person, place, and time.  Skin: Skin is warm and dry. No rash noted. He is not diaphoretic.  Vitals reviewed.   ED Course  Procedures (including critical care time) Labs Review Labs Reviewed  CBC WITH DIFFERENTIAL/PLATELET - Abnormal; Notable for the following:    RBC 3.88 (*)    Hemoglobin 12.2 (*)    HCT 34.7 (*)    All other components within normal limits  COMPREHENSIVE METABOLIC PANEL - Abnormal; Notable for the following:    Sodium 133 (*)    Potassium 5.3 (*)    CO2 17 (*)    Glucose, Bld 317 (*)    BUN 73 (*)    Creatinine, Ser 2.66 (*)    Total Protein 6.4 (*)  AST 42 (*)    Total Bilirubin 1.3 (*)    GFR calc non Af Amer 20 (*)    GFR calc Af Amer 24 (*)    All other components within normal limits  URINALYSIS, ROUTINE W REFLEX MICROSCOPIC (NOT AT [facility]) - Abnormal; Notable for the following:    APPearance TURBID (*)    Glucose, UA >1000 (*)    Hgb urine dipstick MODERATE (*)    Ketones, ur 15 (*)    Protein, ur 100 (*)    Leukocytes, UA LARGE (*)    All other components within normal limits  URINE MICROSCOPIC-ADD ON - Abnormal; Notable for the following:    Squamous Epithelial / LPF 0-5 (*)    Bacteria, UA FEW (*)    All other components within normal limits  CBG MONITORING, ED - Abnormal; Notable for the following:    Glucose-Capillary 257 (*)    All other components within normal limits  I-STAT CG4 LACTIC ACID, ED - Abnormal; Notable for the following:    Lactic Acid, Venous 1.93 (*)    All other components within normal limits  BLOOD GAS, VENOUS  I-STAT TROPOININ, ED  I-STAT CG4 LACTIC ACID, ED    Imaging Review Dg Chest 2 View  01/27/2016  CLINICAL DATA:  Hyperglycemia the last 4-6 days. Unexplained weight loss. EXAM: CHEST  2 VIEW COMPARISON:  12/16/2015 FINDINGS: Heart is normal size. No confluent airspace opacities or effusions. Mediastinal contours within normal  limits. No acute bony abnormality. IMPRESSION: No active cardiopulmonary disease. Electronically Signed   By: Charlett Nose M.D.   On: 01/27/2016 09:58   I have personally reviewed and evaluated these images and lab results as part of my medical decision-making.   EKG Interpretation   Date/Time:  Monday January 27 2016 09:26:38 EDT Ventricular Rate:  68 PR Interval:    QRS Duration: 90 QT Interval:  395 QTC Calculation: 421 R Axis:   50 Text Interpretation:  Sinus rhythm Atrial premature complex No significant  change since last tracing Confirmed by NGUYEN, EMILY ([id number]) on 01/27/2016  12:20:24 PM      MDM  Patient was seen and evaluated in stable condition. Patient with possibly partially treated DKA. Anion gap 15, bicarbonate 17, glucose over 300. Patient was given IV fluids. Of note patient's labs also revealed a BUN of over 70 and a creatinine of 2.66 up from patient's baseline around 1.6. In light of abnormal laboratory findings case was discussed with the hospitalist on-call for [facility] who agreed with admission. Patient was admitted to telemetry under her care. Patient and wife are updated on results and plan of care. Final diagnoses:  Acidosis  Hyperglycemia  Acute kidney injury (HCC)    1. Generalized weakness 2. Acidosis 3. Hyperglycemia 4. Acute kidney injury    Leta Baptist, MD 01/27/16 1229

## 2016-01-27 NOTE — H&P (Signed)
History and Physical    [name]:[id number] DOB: [birth year] DOA: 01/27/2016  PCP: Jerlyn Ly, MD  Patient coming from: Home.   Chief Complaint: Elevated blood sugar, weakness.   HPI: Benjamin Riddle is a 82 y.o. male with medical history significant of 82 year old male with history of diabetes mellitus, COPD, previous DKA presents for elevated blood sugar and weakness. Patient report difficulty controlling blood sugar at home,earlythismorning was at 600-700. He is feeling weak, tired, no energy. He has been in bed most of the time this past weekend. He denies chest pain, dyspnea, abdominal pain. He does I and O cath BID to TID PRN. Drink fluids but not eating a lot.    ED Course: Presents with hyperglycemia, cbg 317, cr 2.6, k 5.3, bicarb 17, sodium 133, UA with too numerous to count WBC, positive for ketones, lactic acid 1.9. Chest x ray negative.   Review of Systems: As per HPI otherwise 10 point review of systems negative.    Past Medical History  Diagnosis Date  . Diabetes mellitus   . Asthma   . Hydronephrosis   . Kidney disease     Stage III chronic kidney disease  . Hypertension   . Neurogenic bladder   . Osteoporosis   . Hypogonadism male   . Recurrent urinary tract infection     With resistent Pseudomonas  . Allergic rhinitis   . Hepatitis C     due to blood transfusion  . Hyperlipidemia   . SOB (shortness of breath)   . CKD (chronic kidney disease), stage III   . Combined systolic and diastolic congestive heart failure ([place])   . COPD (chronic obstructive pulmonary disease) ([place])   . Allergy   . Cataract   . Heart murmur     Past Surgical History  Procedure Laterality Date  . Hernia repair    . Tonsillectomy    . Bladder repair      obstruction surgery  . Frontal sinus obliteration      sinus repaired with a plate  . Cataract extraction  2015  . Eye surgery    . Fracture surgery    . Prostate surgery    . Spine surgery       reports that  he quit smoking about 4 years ago. His smoking use included Cigars and Cigarettes. He has never used smokeless tobacco. He reports that he does not drink alcohol or use illicit drugs.  Allergies  Allergen Reactions  . Aspirin     unknown  . Bactrim [Sulfamethoxazole-Trimethoprim] Nausea And Vomiting  . Codeine Nausea And Vomiting  . Gentamicin     Becomes dizzy and weak  . Lexapro [Escitalopram Oxalate]     unknown  . Protonix [Pantoprazole Sodium]     unknown  . Ciprofloxacin Itching and Rash    Family History  Problem Relation Age of Onset  . Heart failure Mother   . Hypertension Mother   . COPD Father     smoked  . Diabetes Father   . Cancer Sister     Breast  . Diabetes Sister   . COPD Brother     smoked     Prior to Admission medications   Medication Sig Start Date End Date Taking? Authorizing Provider  albuterol (2.5 MG/3ML) 0.083% NEBU 3 mL, albuterol (5 MG/ML) 0.5% NEBU 0.5 mL Inhale 5 mg into the lungs every 6 (six) hours as needed (wheezing).   Yes Historical Provider, MD  albuterol (PROVENTIL,VENTOLIN) 90 MCG/ACT inhaler  Inhale 2 puffs into the lungs every 4 (four) hours as needed for wheezing or shortness of breath.    Yes Historical Provider, MD  bismuth subsalicylate (PEPTO BISMOL) 262 MG chewable tablet Chew 262 mg by mouth 2 (two) times daily as needed for diarrhea or loose stools.   Yes Historical Provider, MD  cephALEXin (KEFLEX) 250 MG capsule Take 250 mg by mouth daily. 01/08/16  Yes Historical Provider, MD  cholecalciferol (VITAMIN D) 1000 UNITS tablet Take 1,000 Units by mouth daily.   Yes Historical Provider, MD  feeding supplement, GLUCERNA SHAKE, (GLUCERNA SHAKE) LIQD Take 237 mLs by mouth 3 (three) times daily between meals. 12/19/15  Yes Robbie Lis, MD  insulin aspart (NOVOLOG) 100 UNIT/ML injection Inject 8-20 Units into the skin 3 (three) times daily before meals. If bs is >150 inject add 1 unit every 50   Yes Historical Provider, MD  Insulin  Glargine (TOUJEO SOLOSTAR) 300 UNIT/ML SOPN Inject 12-17 Units into the skin at bedtime.    Yes Historical Provider, MD  mometasone-formoterol (DULERA) 100-5 MCG/ACT AERO Inhale 2 puffs into the lungs 2 (two) times daily.   Yes Historical Provider, MD  saccharomyces boulardii (FLORASTOR) 250 MG capsule Take 1 capsule (250 mg total) by mouth 2 (two) times daily. Patient taking differently: Take 250 mg by mouth daily.  01/26/15  Yes Robbie Lis, MD  simvastatin (ZOCOR) 40 MG tablet Take 40 mg by mouth daily.     Yes Historical Provider, MD    Physical Exam: Filed Vitals:   01/27/16 0848 01/27/16 1021 01/27/16 1100 01/27/16 1313  BP: 125/67 127/67 130/69 132/68  Pulse: 72 69 75 72  Temp: 97.5 F (36.4 C)   97.5 F (36.4 C)  TempSrc: Oral   Oral  Resp: 18 18 17 17   SpO2: 100% 100% 100% 100%      Constitutional: NAD, calm, comfortable, thin appearing Filed Vitals:   01/27/16 0848 01/27/16 1021 01/27/16 1100 01/27/16 1313  BP: 125/67 127/67 130/69 132/68  Pulse: 72 69 75 72  Temp: 97.5 F (36.4 C)   97.5 F (36.4 C)  TempSrc: Oral   Oral  Resp: 18 18 17 17   SpO2: 100% 100% 100% 100%   Eyes: PERRL, lids and conjunctivae normal ENMT: Mucous membranes are moist. Posterior pharynx clear of any exudate or lesions.Normal dentition.  Neck: normal, supple, no masses, no thyromegaly Respiratory: clear to auscultation bilaterally, no wheezing, no crackles. Normal respiratory effort. No accessory muscle use.  Cardiovascular: Regular rate and rhythm, no murmurs / rubs / gallops. No extremity edema. 2+ pedal pulses. No carotid bruits.  Abdomen: no tenderness, no masses palpated. No hepatosplenomegaly. Bowel sounds positive.  Musculoskeletal: no clubbing / cyanosis. No joint deformity upper and lower extremities. Good ROM, no contractures. Normal muscle tone.  Skin: no rashes, lesions, ulcers. No induration Neurologic: CN 2-12 grossly intact. Sensation intact, DTR normal. Strength 5/5 in all 4.   Psychiatric: Normal judgment and insight. Alert and oriented x 3. Normal mood.     Labs on Admission: I have personally reviewed following labs and imaging studies  CBC:  Recent Labs Lab 01/27/16 1021  WBC 6.8  NEUTROABS 4.7  HGB 12.2*  HCT 34.7*  MCV 89.4  PLT 809   Basic Metabolic Panel:  Recent Labs Lab 01/27/16 1021  NA 133*  K 5.3*  CL 101  CO2 17*  GLUCOSE 317*  BUN 73*  CREATININE 2.66*  CALCIUM 9.1   GFR: CrCl cannot be calculated (Unknown  ideal weight.). Liver Function Tests:  Recent Labs Lab 01/27/16 1021  AST 42*  ALT 40  ALKPHOS 85  BILITOT 1.3*  PROT 6.4*  ALBUMIN 3.5   No results for input(s): LIPASE, AMYLASE in the last 168 hours. No results for input(s): AMMONIA in the last 168 hours. Coagulation Profile: No results for input(s): INR, PROTIME in the last 168 hours. Cardiac Enzymes: No results for input(s): CKTOTAL, CKMB, CKMBINDEX, TROPONINI in the last 168 hours. BNP (last 3 results) No results for input(s): PROBNP in the last 8760 hours. HbA1C: No results for input(s): HGBA1C in the last 72 hours. CBG:  Recent Labs Lab 01/27/16 0848  GLUCAP 257*   Lipid Profile: No results for input(s): CHOL, HDL, LDLCALC, TRIG, CHOLHDL, LDLDIRECT in the last 72 hours. Thyroid Function Tests: No results for input(s): TSH, T4TOTAL, FREET4, T3FREE, THYROIDAB in the last 72 hours. Anemia Panel: No results for input(s): VITAMINB12, FOLATE, FERRITIN, TIBC, IRON, RETICCTPCT in the last 72 hours. Urine analysis:    Component Value Date/Time   COLORURINE YELLOW 01/27/2016 1148   APPEARANCEUR TURBID* 01/27/2016 1148   LABSPEC 1.013 01/27/2016 1148   PHURINE 6.0 01/27/2016 1148   GLUCOSEU >1000* 01/27/2016 1148   HGBUR MODERATE* 01/27/2016 1148   BILIRUBINUR NEGATIVE 01/27/2016 1148   KETONESUR 15* 01/27/2016 1148   PROTEINUR 100* 01/27/2016 1148   UROBILINOGEN 0.2 04/06/2015 2033   NITRITE NEGATIVE 01/27/2016 1148   LEUKOCYTESUR LARGE*  01/27/2016 1148   Sepsis Labs: !!!!!!!!!!!!!!!!!!!!!!!!!!!!!!!!!!!!!!!!!!!! @LABRCNTIP (procalcitonin:4,lacticidven:4) )No results found for this or any previous visit (from the past 240 hour(s)).   Radiological Exams on Admission: Dg Chest 2 View  01/27/2016  CLINICAL DATA:  Hyperglycemia the last 4-6 days. Unexplained weight loss. EXAM: CHEST  2 VIEW COMPARISON:  12/16/2015 FINDINGS: Heart is normal size. No confluent airspace opacities or effusions. Mediastinal contours within normal limits. No acute bony abnormality. IMPRESSION: No active cardiopulmonary disease. Electronically Signed   By: Rolm Baptise M.D.   On: 01/27/2016 09:58    EKG: Independently reviewed. Sinus Rythm.   Assessment/Plan Active Problems:   Combined systolic and diastolic congestive heart failure (HCC)   DKA (diabetic ketoacidoses) (HCC)   Acute renal failure superimposed on stage 4 chronic kidney disease (HCC)   Bladder outlet obstruction   URI (upper respiratory infection)   Acute kidney injury ([place])   Acidosis   UTI (lower urinary tract infection)   Metabolic acidosis  1-Metabolic acidosis, could be early or partially treated DKA, vs related to renal failure.  IV fluids.  Will order Insulin.  Repeat B-met in 2 hours if worsening acidosis and or hyperglycemia will start insulin Gtt.  Admit to step down unit.   2-Acute on chronic stage IV renal failure.  Last cr per records 1.7. Cr today at 2.6 BUN 73.  Suspect related to hypovolemia , UTI.  IV fluids.  Strict I and O.  Repeat labs in am.   3-Diabetes, suspect early DKA;  IV fluids. SSI. Levemir ordered.  Repeat labs in 2 hours, if gap open will start insulin gtt.   UTI, in setting of chronic PRN catheterization.  UA with too numerous to count WBC.  Follow urine culture.  IV ceftriaxone.   4-Mild Hyperkalemia; IV fluids. Repeat labs this afternoon.  5-Lactic acidosis. Suspect related to infection. Trending down.      DVT prophylaxis:  heparin.  Code Status: Full code.  Family Communication: Care discussed with patient and wife who was at bedside.  Disposition Plan: home in 2 to  3 days  Consults called: none Admission status: inpatient, step down.    Elmarie Shiley MD [facility] Pager [phone]  If 7PM-7AM, please contact night-coverage [email or web address] Password [email or web address]  01/27/2016, 1:18 PM

## 2016-01-27 NOTE — Progress Notes (Signed)
Glucose  580 on BMET DR Regalado notifed.

## 2016-01-27 NOTE — ED Notes (Signed)
Attempt to draw blood from left AC. Covil reports will attempt blood draw post collection of EKG. Pt given diet coke by [employer].

## 2016-01-27 NOTE — ED Notes (Signed)
Patient stuck x2 for blood, unsuccessful x2

## 2016-01-27 NOTE — Progress Notes (Signed)
Patient blood sugar 580, Dr Sunnie Nielsenregalado notified and gave orders, patient started on gluco stabilizer and insulin drip,

## 2016-01-27 NOTE — ED Notes (Signed)
Patient has I&O cath kit at bedside and will self cath when he feels the urge to urinate

## 2016-01-28 DIAGNOSIS — N390 Urinary tract infection, site not specified: Secondary | ICD-10-CM

## 2016-01-28 DIAGNOSIS — E0810 Diabetes mellitus due to underlying condition with ketoacidosis without coma: Secondary | ICD-10-CM

## 2016-01-28 DIAGNOSIS — N320 Bladder-neck obstruction: Secondary | ICD-10-CM

## 2016-01-28 LAB — BASIC METABOLIC PANEL
Anion gap: 8 (ref 5–15)
BUN: 62 mg/dL — AB (ref 6–20)
CALCIUM: 8.2 mg/dL — AB (ref 8.9–10.3)
CO2: 21 mmol/L — AB (ref 22–32)
CREATININE: 1.88 mg/dL — AB (ref 0.61–1.24)
Chloride: 108 mmol/L (ref 101–111)
GFR calc Af Amer: 36 mL/min — ABNORMAL LOW (ref >60)
GFR calc non Af Amer: 31 mL/min — ABNORMAL LOW (ref >60)
GLUCOSE: 108 mg/dL — AB (ref 65–99)
Potassium: 4 mmol/L (ref 3.5–5.1)
Sodium: 137 mmol/L (ref 135–145)

## 2016-01-28 LAB — GLUCOSE, CAPILLARY
GLUCOSE-CAPILLARY: 120 mg/dL — AB (ref 65–99)
GLUCOSE-CAPILLARY: 77 mg/dL (ref 65–99)
Glucose-Capillary: 107 mg/dL — ABNORMAL HIGH (ref 65–99)
Glucose-Capillary: 107 mg/dL — ABNORMAL HIGH (ref 65–99)
Glucose-Capillary: 109 mg/dL — ABNORMAL HIGH (ref 65–99)
Glucose-Capillary: 111 mg/dL — ABNORMAL HIGH (ref 65–99)
Glucose-Capillary: 120 mg/dL — ABNORMAL HIGH (ref 65–99)
Glucose-Capillary: 232 mg/dL — ABNORMAL HIGH (ref 65–99)
Glucose-Capillary: 74 mg/dL (ref 65–99)
Glucose-Capillary: 91 mg/dL (ref 65–99)

## 2016-01-28 LAB — URINE CULTURE: Culture: NO GROWTH

## 2016-01-28 MED ORDER — INSULIN GLARGINE 100 UNIT/ML ~~LOC~~ SOLN: 15.0000 [IU] | Freq: Every day | SUBCUTANEOUS | Status: DC

## 2016-01-28 MED ORDER — INSULIN GLARGINE 100 UNIT/ML ~~LOC~~ SOLN
15.0000 [IU] | Freq: Every day | SUBCUTANEOUS | Status: DC
  Filled 2016-01-28: qty 0.15

## 2016-01-28 MED ORDER — SODIUM CHLORIDE 0.9 % IV BOLUS (SEPSIS)
500.0000 mL | Freq: Once | INTRAVENOUS | Status: AC
  Administered 2016-01-28: 500 mL via INTRAVENOUS

## 2016-01-28 MED ORDER — SIMVASTATIN 40 MG PO TABS: 40.0000 mg | ORAL_TABLET | Freq: Every day | ORAL | Status: DC

## 2016-01-28 MED ORDER — SODIUM CHLORIDE 0.9 % IV SOLN: INTRAVENOUS | Status: AC

## 2016-01-28 MED ORDER — ATORVASTATIN CALCIUM 10 MG PO TABS
20.0000 mg | ORAL_TABLET | Freq: Every day | ORAL | Status: DC
  Administered 2016-01-28 – 2016-01-29 (×2): 20 mg via ORAL
  Filled 2016-01-28 (×2): qty 2

## 2016-01-28 MED ORDER — INSULIN ASPART 100 UNIT/ML ~~LOC~~ SOLN: 0.0000 [IU] | Freq: Three times a day (TID) | SUBCUTANEOUS | Status: DC

## 2016-01-28 MED ORDER — INSULIN GLARGINE 100 UNIT/ML ~~LOC~~ SOLN
15.0000 [IU] | Freq: Every day | SUBCUTANEOUS | Status: DC
  Administered 2016-01-28: 15 [IU] via SUBCUTANEOUS
  Filled 2016-01-28: qty 0.15

## 2016-01-28 MED ORDER — SODIUM CHLORIDE 0.9 % IV SOLN
INTRAVENOUS | Status: DC
Start: 2016-01-28 — End: 2016-01-28
  Administered 2016-01-28: 09:00:00 via INTRAVENOUS

## 2016-01-28 MED ORDER — AMLODIPINE BESYLATE 5 MG PO TABS
5.0000 mg | ORAL_TABLET | Freq: Every day | ORAL | Status: DC
  Administered 2016-01-28 – 2016-01-29 (×2): 5 mg via ORAL
  Filled 2016-01-28 (×2): qty 1

## 2016-01-28 NOTE — Progress Notes (Signed)
PROGRESS NOTE                                                                                                                                                                                                             Patient Demographics:    Benjamin Riddle, is a 82 y.o. male, DOB - Nov 20, 1927, RUE:[id number]  Admit date - 01/27/2016   Admitting Physician Alba Cory, MD  Outpatient Primary MD for the patient is Ezequiel Kayser, MD  LOS - 1  Chief Complaint  Patient presents with  . Hyperglycemia       Brief Narrative    Benjamin Riddle is a 82 y.o. male with medical history significant of 82 year old male with history of diabetes mellitus, COPD, previous DKA presents for elevated blood sugar and weakness. Patient report difficulty controlling blood sugar at home,earlythismorning was at 600-700. He is feeling weak, tired, no energy. He has been in bed most of the time this past weekend. He denies chest pain, dyspnea, abdominal pain. He does I and O cath BID to TID PRN. Drink fluids but not eating a lot.    ED Course: Presents with hyperglycemia, cbg 317, cr 2.6, k 5.3, bicarb 17, sodium 133, UA with too numerous to count WBC, positive for ketones, lactic acid 1.9. Chest x ray negative.     Subjective:    Benjamin Riddle today has, No headache, No chest pain, No abdominal pain - No Nausea, No new weakness tingling or numbness, No Cough - SOB.    Assessment  & Plan :     1.DM type II with mild DKA, DKA causing mild lactic acidosis and metabolic acidosis. Caused by UTI. Resolved after IV insulin and IV fluids, transitioned to Lantus and sliding scale will monitor. All acidosis has resolved.  Lab Results  Component Value Date   HGBA1C 9.5 07/26/2015   CBG (last 3)   Recent Labs  01/28/16 0345 01/28/16 0456 01/28/16 0756  GLUCAP 107* 120* 91    2.UTI. Continue empiric Rocephin and follow cultures, Patient has  chronic urinary retention and self caths twice a day, has been counseled to self cath every 6 hours instead of every 12. Outpatient follow-up with urology.  3. Generalized weakness. Due to combination of 1 and 2, treat as 1 and 2, PT eval.  4. Dyslipidemia. On statin.  5. Hypertension. Placed on low-dose Norvasc.  6. CK D stage IV. Creatinine at baseline which is close to 2.   Family Communication  :  None present  Code Status :  Full  Diet : Carb Mod  Disposition Plan  :  Home in am  Consults  :     Procedures  :     DVT Prophylaxis  : Heparin   Lab Results  Component Value Date   PLT 316 01/27/2016    Inpatient Medications  Scheduled Meds: . cefTRIAXone (ROCEPHIN)  IV  1 g Intravenous Q24H  . cholecalciferol  1,000 Units Oral Daily  . feeding supplement (GLUCERNA SHAKE)  237 mL Oral TID BM  . heparin  5,000 Units Subcutaneous Q8H  . insulin aspart  0-9 Units Subcutaneous TID WC  . [START ON 01/29/2016] insulin glargine  15 Units Subcutaneous Daily  . mometasone-formoterol  2 puff Inhalation BID  . saccharomyces boulardii  250 mg Oral Daily   Continuous Infusions: . sodium chloride 75 mL/hr at 01/28/16 0916   PRN Meds:.acetaminophen **OR** [DISCONTINUED] acetaminophen, albuterol, bismuth subsalicylate, [DISCONTINUED] ondansetron **OR** ondansetron (ZOFRAN) IV  Antibiotics  :    Anti-infectives    Start     Dose/Rate Route Frequency Ordered Stop   01/27/16 1400  cefTRIAXone (ROCEPHIN) 1 g in dextrose 5 % 50 mL IVPB     1 g 100 mL/hr over 30 Minutes Intravenous Every 24 hours 01/27/16 1314           Objective:   Filed Vitals:   01/28/16 0400 01/28/16 0500 01/28/16 0800 01/28/16 0914  BP: 117/64  161/90   Pulse:    92  Temp: 98.4 F (36.9 C)  97.7 F (36.5 C)   TempSrc: Oral  Oral   Resp: 14  15 18   Height:      Weight:  55.2 kg (121 lb 11.1 oz)    SpO2: 100%  100% 99%    Wt Readings from Last 3 Encounters:  01/28/16 55.2 kg (121 lb 11.1 oz)    12/24/15 58.06 kg (128 lb)  12/16/15 57.7 kg (127 lb 3.3 oz)     Intake/Output Summary (Last 24 hours) at 01/28/16 0941 Last data filed at 01/28/16 0600  Gross per 24 hour  Intake 3233.99 ml  Output   2200 ml  Net 1033.99 ml     Physical Exam  Awake Alert, Oriented X 3, No new F.N deficits, Normal affect .AT,PERRAL Supple Neck,No JVD, No cervical lymphadenopathy appriciated.  Symmetrical Chest wall movement, Good air movement bilaterally, CTAB RRR,No Gallops,Rubs or new Murmurs, No Parasternal Heave +ve B.Sounds, Abd Soft, No tenderness, No organomegaly appriciated, No rebound - guarding or rigidity. No Cyanosis, Clubbing or edema, No new Rash or bruise      Data Review:    CBC  Recent Labs Lab 01/27/16 1021  WBC 6.8  HGB 12.2*  HCT 34.7*  PLT 316  MCV 89.4  MCH 31.4  MCHC 35.2  RDW 14.4  LYMPHSABS 1.1  MONOABS 0.9  EOSABS 0.1  BASOSABS 0.0    Chemistries   Recent Labs Lab 01/27/16 1021 01/27/16 1440 01/27/16 1834 01/27/16 2229 01/28/16 0253  NA 133* 131* 134* 138 137  K 5.3* 6.1* 4.5 3.9 4.0  CL 101 98* 104 108 108  CO2 17* 13* 12* 19* 21*  GLUCOSE 317* 580* 463* 144* 108*  BUN 73* 73* 69* 63* 62*  CREATININE 2.66* 2.75* 2.79* 2.39* 1.88*  CALCIUM 9.1 8.9 8.7* 8.7* 8.2*  AST 42*  --   --   --   --   ALT 40  --   --   --   --   ALKPHOS 85  --   --   --   --   BILITOT 1.3*  --   --   --   --    ------------------------------------------------------------------------------------------------------------------ No results for input(s): CHOL, HDL, LDLCALC, TRIG, CHOLHDL, LDLDIRECT in the last 72 hours.  Lab Results  Component Value Date   HGBA1C 9.5 07/26/2015   ------------------------------------------------------------------------------------------------------------------ No results for input(s): TSH, T4TOTAL, T3FREE, THYROIDAB in the last 72 hours.  Invalid input(s):  FREET3 ------------------------------------------------------------------------------------------------------------------ No results for input(s): VITAMINB12, FOLATE, FERRITIN, TIBC, IRON, RETICCTPCT in the last 72 hours.  Coagulation profile No results for input(s): INR, PROTIME in the last 168 hours.  No results for input(s): DDIMER in the last 72 hours.  Cardiac Enzymes No results for input(s): CKMB, TROPONINI, MYOGLOBIN in the last 168 hours.  Invalid input(s): CK ------------------------------------------------------------------------------------------------------------------ No results found for: BNP  Micro Results Recent Results (from the past 240 hour(s))  MRSA PCR Screening     Status: None   Collection Time: 01/27/16  2:30 PM  Result Value Ref Range Status   MRSA by PCR NEGATIVE NEGATIVE Final    Comment:        The GeneXpert MRSA Assay (FDA approved for NASAL specimens only), is one component of a comprehensive MRSA colonization surveillance program. It is not intended to diagnose MRSA infection nor to guide or monitor treatment for MRSA infections.     Radiology Reports Dg Chest 2 View  01/27/2016  CLINICAL DATA:  Hyperglycemia the last 4-6 days. Unexplained weight loss. EXAM: CHEST  2 VIEW COMPARISON:  12/16/2015 FINDINGS: Heart is normal size. No confluent airspace opacities or effusions. Mediastinal contours within normal limits. No acute bony abnormality. IMPRESSION: No active cardiopulmonary disease. Electronically Signed   By: Charlett Nose M.D.   On: 01/27/2016 09:58    Time Spent in minutes  30   Benjamin Riddle K M.D on 01/28/2016 at 9:41 AM  Between 7am to 7pm - Pager - [phone]  After 7pm go to [email or web address] - password [facility]  [facility] -  Office  [phone]

## 2016-01-28 NOTE — Progress Notes (Signed)
Notified Kirby NP of Bmet and status of insulin gtt, new orders noted and received.

## 2016-01-28 NOTE — Progress Notes (Signed)
Paged Craige CottaKirby NP with results of Bmet, and current status regarding insulin gtt. Orders noted and received.

## 2016-01-28 NOTE — Clinical Social Work Note (Signed)
Clinical Social Work Assessment  Patient Details  Name: Benjamin Riddle MRN: [id number] Date of Birth: 22-Jul-1927  Date of referral:  01/28/16               Reason for consult:  Discharge Planning                Permission sought to share information with:  Facility Art therapist granted to share information::  Yes, Verbal Permission Granted  Name::        Agency::     Relationship::     Contact Information:     Housing/Transportation Living arrangements for the past 2 months:  Apartment, [place] of Information:  Patient, Facility Patient Interpreter Needed:  None Criminal Activity/Legal Involvement Pertinent to Current Situation/Hospitalization:  No - Comment as needed Significant Relationships:  Spouse Lives with:  Spouse Do you feel safe going back to the place where you live?  Yes Need for family participation in patient care:  Yes (Comment)  Care giving concerns:  No family at bedside. No concerns  reported by pt.   Social Worker assessment / plan:  Pt hospitalized on 01/27/16 with AKI. CSW consulted to assist with d/c planning. PN reviewed. CSW met with pt at bedside. Pt is from [facility]. Pt reports that he has been in rehab at [facility] in the past. Pt gave CSW permission to speak with Admissions at [facility]. PT eval is pending. Rehab bed will be available at [facility], if needed, at d/c. Pt is open to returning to rehab, if recommended. CSW will meet with pt / spouse following PT recommendations.  Employment status:  Retired Nurse, adult PT Recommendations:  Not assessed at this time Information / Referral to community resources:     Patient/Family's Response to care:  PT eval is pending. Disposition is undetermined.  Patient/Family's Understanding of and Emotional Response to Diagnosis, Current Treatment, and Prognosis:  MD reviewed medical status with pt this am. "  I'm feeling better than I did yesterday." He is aware he will transfer from SDU today. Pt will go to rehab unit at [facility] if needed.  Emotional Assessment Appearance:    Attitude/Demeanor/Rapport:  Other (cooperative) Affect (typically observed):  Calm, Appropriate Orientation:  Oriented to Self, Oriented to Place, Oriented to  Time, Oriented to Situation Alcohol / Substance use:    Psych involvement (Current and /or in the community):  No (Comment)  Discharge Needs  Concerns to be addressed:  Discharge Planning Concerns Readmission within the last 30 days:  No Current discharge risk:  None Barriers to Discharge:  No Barriers Identified   Luretha Rued, [place] 01/28/2016, 10:30 AM

## 2016-01-28 NOTE — Evaluation (Signed)
Physical Therapy Evaluation Patient Details Name: Benjamin Riddle MRN: [id number] DOB: 01-15-1928 Today's Date: 01/28/2016   History of Present Illness  82 y.o. male with medical history significant for diabetes mellitus, COPD, HTN, neurogenic bladder (self cath), previous DKA presents with hyperglycemia and weakness  Clinical Impression  Pt admitted with above diagnosis. Pt currently with functional limitations due to the deficits listed below (see PT Problem List).  Pt will benefit from skilled PT to increase their independence and safety with mobility to allow discharge to the venue listed below.   Pt from ILF with spouse however spouse reports fall last night with possible R shoulder injury (states she was planning to go to ED after PT left pt) so pt and spouse would prefer SNF section upon d/c.     Follow Up Recommendations SNF    Equipment Recommendations  None recommended by PT    Recommendations for Other Services       Precautions / Restrictions Precautions Precautions: Fall      Mobility  Bed Mobility Overal bed mobility: Needs Assistance Bed Mobility: Supine to Sit;Sit to Supine     Supine to sit: Min guard;HOB elevated Sit to supine: Min guard;HOB elevated      Transfers Overall transfer level: Needs assistance Equipment used: Rolling walker (2 wheeled) Transfers: Sit to/from Stand Sit to Stand: Min assist         General transfer comment: verbal cues for hand placement, assist to rise and steady  Ambulation/Gait Ambulation/Gait assistance: Min assist Ambulation Distance (Feet): 60 Feet Assistive device: Rolling walker (2 wheeled) Gait Pattern/deviations: Step-to pattern;Narrow base of support;Shuffle     General Gait Details: narrow BOS, shuffling type gait leading with R LE, slow pace  Stairs            Wheelchair Mobility    Modified Rankin (Stroke Patients Only)       Balance Overall balance assessment: History of Falls                                            Pertinent Vitals/Pain Pain Assessment: No/denies pain    Home Living Family/patient expects to be discharged to:: Private residence Living Arrangements: Spouse/significant other   Type of Home: Independent living facility         Home Equipment: Dan Humphreys - 2 wheels;Cane - single point;Walker - 4 wheels      Prior Function Level of Independence: Independent with assistive device(s)         Comments: uses RW or cane     Hand Dominance        Extremity/Trunk Assessment               Lower Extremity Assessment: Generalized weakness         Communication   Communication: HOH  Cognition Arousal/Alertness: Awake/alert Behavior During Therapy: WFL for tasks assessed/performed Overall Cognitive Status: Within Functional Limits for tasks assessed                      General Comments      Exercises        Assessment/Plan    PT Assessment Patient needs continued PT services  PT Diagnosis Difficulty walking   PT Problem List Decreased strength;Decreased activity tolerance;Decreased balance;Decreased mobility;Decreased knowledge of use of DME  PT Treatment Interventions DME instruction;Gait training;Functional mobility training;Patient/family education;Therapeutic exercise;Therapeutic activities  PT Goals (Current goals can be found in the Care Plan section) Acute Rehab PT Goals PT Goal Formulation: With patient Time For Goal Achievement: 02/11/16 Potential to Achieve Goals: Good    Frequency Min 3X/week   Barriers to discharge        Co-evaluation               End of Session Equipment Utilized During Treatment: Gait belt Activity Tolerance: Patient limited by fatigue Patient left: in bed;with call bell/phone within reach;with bed alarm set;with family/visitor present           Time: 1100-1114 PT Time Calculation (min) (ACUTE ONLY): 14 min   Charges:   PT Evaluation $PT  Eval Low Complexity: 1 Procedure     PT G Codes:        Emma Schupp,KATHrine E 01/28/2016, 12:21 PM Zenovia JarredKati Zelene Barga, PT, DPT 01/28/2016 Pager: [phone]

## 2016-01-28 NOTE — Progress Notes (Signed)
CSW assisting with d/c planning. PT has recommended SNF at d/c. CSW met with pt / spouse at bedside to review PT recommendations. Pt / spouse are in agreement with d/c to SNF at [facility] prior to returning to [facility] apt. [facility] contacted and will have a bed available when pt is ready for d/c. CSW will continue to assist with d/c planning needs.  Werner Lean LCSW [phone]

## 2016-01-28 NOTE — Progress Notes (Signed)
Patient received from ICU Stepdown.  Alert and O x 4.  In no distress.  Oriented to room and surroundings.  Wife at bedside.

## 2016-01-28 NOTE — NC FL2 (Signed)
[place] MEDICAID FL2 LEVEL OF CARE SCREENING TOOL     IDENTIFICATION  Patient Name: Benjamin Riddle Birthdate: 1927/12/24 Sex: male Admission Date (Current Location): 01/27/2016  [facility] [place]   [facility] and Address[facility]  [facility],  [address], [place] [id number]      Provider Number: [id number]  Attending Physician Name and Address:  Leroy SeaPrashant K Singh, MD  Relative Name and Phone Number:       Current Level of Care: Hospital Recommended Level of Care: Skilled Nursing Facility Prior Approval Number:    Date Approved/Denied:   PASRR Number:    Discharge Plan: SNF    Current Diagnoses: Patient Active Problem List   Diagnosis Date Noted  . Acute kidney injury (HCC) 01/27/2016  . Acidosis 01/27/2016  . UTI (lower urinary tract infection) 01/27/2016  . Metabolic acidosis 01/27/2016  . Type 1 diabetes mellitus with ketoacidosis, uncontrolled (HCC) 01/19/2016  . Chronic UTI 11/21/2015  . UTI (urinary tract infection) 11/08/2015  . URI (upper respiratory infection) 06/18/2015  . Bronchitis 06/18/2015  . Sepsis (HCC) 06/18/2015  . CAP (community acquired pneumonia) 06/18/2015  . Fall   . Bladder outlet obstruction 04/06/2015  . Protein-calorie malnutrition, severe (HCC) 01/25/2015  . DKA (diabetic ketoacidoses) (HCC) 01/23/2015  . Acute renal failure superimposed on stage 4 chronic kidney disease (HCC) 01/23/2015  . Combined systolic and diastolic congestive heart failure (HCC)   . COPD GOLD III  11/23/2014  . Asthmatic bronchitis , chronic (HCC) 08/31/2014  . HLD (hyperlipidemia) 12/09/2009    Orientation RESPIRATION BLADDER Height & Weight     Self, Time, Situation, Place  Normal Incontinent Weight: 55.2 kg (121 lb 11.1 oz) Height:  5\' 9"  (175.3 cm)  BEHAVIORAL SYMPTOMS/MOOD NEUROLOGICAL BOWEL NUTRITION STATUS  Other (Comment) (No Behaviors)   Continent Diet (carb modified)  AMBULATORY STATUS COMMUNICATION OF NEEDS Skin    Limited Assist Verbally                         Personal Care Assistance Level of Assistance  Bathing, Feeding, Dressing Bathing Assistance: Limited assistance Feeding assistance: Independent Dressing Assistance: Limited assistance     Functional Limitations Info  Sight, Hearing, Speech Sight Info: Adequate Hearing Info: Adequate Speech Info: Adequate    SPECIAL CARE FACTORS FREQUENCY  PT (By licensed PT)     PT Frequency: 5 x wk              Contractures Contractures Info: Not present    Additional Factors Info  Insulin Sliding Scale Code Status Info: Full Code             Current Medications (01/28/2016):  This is the current hospital active medication list Current Facility-Administered Medications  Medication Dose Route Frequency Provider Last Rate Last Dose  . 0.9 %  sodium chloride infusion   Intravenous Continuous Leroy SeaPrashant K Singh, MD 75 mL/hr at 01/28/16 0916    . acetaminophen (TYLENOL) tablet 650 mg  650 mg Oral Q6H PRN Belkys A Regalado, MD      . albuterol (PROVENTIL) (2.5 MG/3ML) 0.083% nebulizer solution 2.5 mg  2.5 mg Nebulization Q6H PRN Belkys A Regalado, MD      . amLODipine (NORVASC) tablet 5 mg  5 mg Oral Daily Leroy SeaPrashant K Singh, MD   5 mg at 01/28/16 1054  . atorvastatin (LIPITOR) tablet 20 mg  20 mg Oral q1800 Leroy SeaPrashant K Singh, MD      . bismuth  subsalicylate (PEPTO BISMOL) chewable tablet 262 mg  262 mg Oral BID PRN Belkys A Regalado, MD      . cefTRIAXone (ROCEPHIN) 1 g in dextrose 5 % 50 mL IVPB  1 g Intravenous Q24H Belkys A Regalado, MD 100 mL/hr at 01/27/16 1354 1 g at 01/27/16 1354  . cholecalciferol (VITAMIN D) tablet 1,000 Units  1,000 Units Oral Daily Alba Cory, MD   1,000 Units at 01/28/16 0915  . feeding supplement (GLUCERNA SHAKE) (GLUCERNA SHAKE) liquid 237 mL  237 mL Oral TID BM Belkys A Regalado, MD   237 mL at 01/28/16 1000  . heparin injection 5,000 Units  5,000 Units Subcutaneous Q8H Belkys A Regalado, MD   5,000  Units at 01/28/16 0600  . insulin aspart (novoLOG) injection 0-9 Units  0-9 Units Subcutaneous TID WC Leda Gauze, NP   0 Units at 01/28/16 0800  . [START ON 01/29/2016] insulin glargine (LANTUS) injection 15 Units  15 Units Subcutaneous Daily Leroy Sea, MD      . mometasone-formoterol [facility]) 100-5 MCG/ACT inhaler 2 puff  2 puff Inhalation BID Alba Cory, MD   2 puff at 01/28/16 0914  . ondansetron (ZOFRAN) injection 4 mg  4 mg Intravenous Q6H PRN Belkys A Regalado, MD      . saccharomyces boulardii (FLORASTOR) capsule 250 mg  250 mg Oral Daily Belkys A Regalado, MD   250 mg at 01/28/16 1610     Discharge Medications: Please see discharge summary for a list of discharge medications.  Relevant Imaging Results:  Relevant Lab Results:   Additional Information SS #  [id number]  Damichael Hofman, Dickey Gave, LCSW

## 2016-01-28 NOTE — Progress Notes (Signed)
In and out cath done, obtained 1150 cc of cloudy, yellow urine, patient tolerated the procedure.

## 2016-01-29 LAB — GLUCOSE, CAPILLARY
GLUCOSE-CAPILLARY: 504 mg/dL — AB (ref 65–99)
Glucose-Capillary: >600 mg/dL (ref 65–99)
Glucose-Capillary: 477 mg/dL — ABNORMAL HIGH (ref 65–99)
Glucose-Capillary: 278 mg/dL — ABNORMAL HIGH (ref 65–99)
Glucose-Capillary: 302 mg/dL — ABNORMAL HIGH (ref 65–99)
Glucose-Capillary: 135 mg/dL — ABNORMAL HIGH (ref 65–99)
Glucose-Capillary: 111 mg/dL — ABNORMAL HIGH (ref 65–99)

## 2016-01-29 LAB — BASIC METABOLIC PANEL
ANION GAP: 13 (ref 5–15)
BUN: 45 mg/dL — AB (ref 6–20)
CHLORIDE: 104 mmol/L (ref 101–111)
CO2: 15 mmol/L — ABNORMAL LOW (ref 22–32)
Calcium: 8.1 mg/dL — ABNORMAL LOW (ref 8.9–10.3)
Creatinine, Ser: 2 mg/dL — ABNORMAL HIGH (ref 0.61–1.24)
GFR calc Af Amer: 33 mL/min — ABNORMAL LOW (ref >60)
GFR calc non Af Amer: 29 mL/min — ABNORMAL LOW (ref >60)
GLUCOSE: 571 mg/dL — AB (ref 65–99)
POTASSIUM: 4.9 mmol/L (ref 3.5–5.1)
Sodium: 132 mmol/L — ABNORMAL LOW (ref 135–145)

## 2016-01-29 MED ORDER — INSULIN ASPART 100 UNIT/ML ~~LOC~~ SOLN
22.0000 [IU] | Freq: Once | SUBCUTANEOUS | Status: AC
  Administered 2016-01-29: 22 [IU] via SUBCUTANEOUS

## 2016-01-29 MED ORDER — PRO-STAT SUGAR FREE PO LIQD
30.0000 mL | Freq: Three times a day (TID) | ORAL | Status: DC
  Administered 2016-01-29 – 2016-01-30 (×3): 30 mL via ORAL
  Filled 2016-01-29 (×3): qty 30

## 2016-01-29 MED ORDER — INSULIN GLARGINE 100 UNIT/ML ~~LOC~~ SOLN
25.0000 [IU] | Freq: Every day | SUBCUTANEOUS | Status: DC
  Administered 2016-01-29 – 2016-01-30 (×2): 25 [IU] via SUBCUTANEOUS
  Filled 2016-01-29 (×2): qty 0.25

## 2016-01-29 MED ORDER — SODIUM CHLORIDE 0.9 % IV SOLN
INTRAVENOUS | Status: AC
  Administered 2016-01-29: 1000 mL via INTRAVENOUS

## 2016-01-29 MED ORDER — INSULIN ASPART 100 UNIT/ML ~~LOC~~ SOLN
3.0000 [IU] | Freq: Once | SUBCUTANEOUS | Status: AC
Start: 2016-01-29 — End: 2016-01-29
  Administered 2016-01-29: 3 [IU] via SUBCUTANEOUS

## 2016-01-29 MED ORDER — INSULIN ASPART 100 UNIT/ML ~~LOC~~ SOLN: 0.0000 [IU] | Freq: Every day | SUBCUTANEOUS | Status: DC

## 2016-01-29 MED ORDER — INSULIN ASPART 100 UNIT/ML ~~LOC~~ SOLN
0.0000 [IU] | Freq: Three times a day (TID) | SUBCUTANEOUS | Status: DC
  Administered 2016-01-29: 15 [IU] via SUBCUTANEOUS

## 2016-01-29 NOTE — Progress Notes (Signed)
PROGRESS NOTE                                                                                                                                                                                                             Patient Demographics:    Benjamin Riddle, is a 82 y.o. male, DOB - 12-30-1927, EAV:[id number]  Admit date - 01/27/2016   Admitting Physician Alba Cory, MD  Outpatient Primary MD for the patient is Ezequiel Kayser, MD  LOS - 2  Chief Complaint  Patient presents with  . Hyperglycemia       Brief Narrative    Benjamin Riddle is a 82 y.o. male with medical history significant of 82 year old male with history of diabetes mellitus, COPD, previous DKA presents for elevated blood sugar and weakness. Patient report difficulty controlling blood sugar at home,earlythismorning was at 600-700. He is feeling weak, tired, no energy. He has been in bed most of the time this past weekend. He denies chest pain, dyspnea, abdominal pain. He does I and O cath BID to TID PRN. Drink fluids but not eating a lot.    ED Course: Presents with hyperglycemia, cbg 317, cr 2.6, k 5.3, bicarb 17, sodium 133, UA with too numerous to count WBC, positive for ketones, lactic acid 1.9. Chest x ray negative.     Subjective:    Benjamin Riddle today has, No headache, No chest pain, No abdominal pain - No Nausea, No new weakness tingling or numbness, No Cough - SOB. He does not appear toxic but overall looks frail and cachectic.   Assessment  & Plan :     1.DM type II with mild DKA, DKA causing mild lactic acidosis and metabolic acidosis. Caused by UTI. Resolved after IV insulin and IV fluids, transitioned to Lantus and sliding scale will monitor. All acidosis has resolved.Sugars spiked high again on 01/29/2016, I question if his yesterday's Lantus dose was missed, have adjusted Lantus and given him couple of boluses of NovoLog and will monitor  CBGs closely. If CBGs better discharged to SNF on 01/30/2016.  Lab Results  Component Value Date   HGBA1C 9.5 07/26/2015   CBG (last 3)   Recent Labs  01/29/16 0728 01/29/16 0836 01/29/16 0929  GLUCAP >600* 504* 477*    2.UTI. Continue empiric Rocephin and follow cultures, Patient has chronic urinary  retention and self caths twice a day, has been counseled to self cath every 6 hours instead of every 12. Outpatient follow-up with urology.  3. Generalized weakness, cachectic appearance, appearance of severe PCM. Due to combination of 1 and 2, treat as 1 and 2, PT eval. will require SNF, will add protein supplementation. Will defer long-term management to PCP  4. Dyslipidemia. On statin.  5. Hypertension. Placed on low-dose Norvasc.  6. CK D stage IV. Creatinine at baseline which is close to 2.   Family Communication  :  None present  Code Status :  Full  Diet : Carb Mod  Disposition Plan  :  Home in am  Consults  :     Procedures  :     DVT Prophylaxis  : Heparin   Lab Results  Component Value Date   PLT 316 01/27/2016    Inpatient Medications  Scheduled Meds: . amLODipine  5 mg Oral Daily  . atorvastatin  20 mg Oral q1800  . cefTRIAXone (ROCEPHIN)  IV  1 g Intravenous Q24H  . cholecalciferol  1,000 Units Oral Daily  . feeding supplement (GLUCERNA SHAKE)  237 mL Oral TID BM  . heparin  5,000 Units Subcutaneous Q8H  . insulin aspart  0-20 Units Subcutaneous TID WC  . insulin aspart  0-5 Units Subcutaneous QHS  . insulin glargine  25 Units Subcutaneous Daily  . mometasone-formoterol  2 puff Inhalation BID  . saccharomyces boulardii  250 mg Oral Daily   Continuous Infusions:   PRN Meds:.acetaminophen **OR** [DISCONTINUED] acetaminophen, albuterol, bismuth subsalicylate, [DISCONTINUED] ondansetron **OR** ondansetron (ZOFRAN) IV  Antibiotics  :    Anti-infectives    Start     Dose/Rate Route Frequency Ordered Stop   01/27/16 1400  cefTRIAXone (ROCEPHIN) 1  g in dextrose 5 % 50 mL IVPB     1 g 100 mL/hr over 30 Minutes Intravenous Every 24 hours 01/27/16 1314           Objective:   Filed Vitals:   01/28/16 2208 01/29/16 0700 01/29/16 0932 01/29/16 0949  BP: 119/68 136/97  121/71  Pulse: 75 85    Temp: 98.3 F (36.8 C) 98.1 F (36.7 C)    TempSrc: Oral Oral    Resp: 20 20    Height:      Weight:      SpO2: 100% 98% 97%     Wt Readings from Last 3 Encounters:  01/28/16 55.2 kg (121 lb 11.1 oz)  12/24/15 58.06 kg (128 lb)  12/16/15 57.7 kg (127 lb 3.3 oz)     Intake/Output Summary (Last 24 hours) at 01/29/16 1009 Last data filed at 01/29/16 0949  Gross per 24 hour  Intake    810 ml  Output   2650 ml  Net  -1840 ml     Physical Exam  Awake Alert, Oriented X 3, No new F.N deficits, Normal affect Benjamin Riddle,Benjamin Riddle Supple Neck,No JVD, No cervical lymphadenopathy appriciated.  Symmetrical Chest wall movement, Good air movement bilaterally, CTAB RRR,No Gallops,Rubs or new Murmurs, No Parasternal Heave +ve B.Sounds, Abd Soft, No tenderness, No organomegaly appriciated, No rebound - guarding or rigidity. No Cyanosis, Clubbing or edema, No new Rash or bruise      Data Review:    CBC  Recent Labs Lab 01/27/16 1021  WBC 6.8  HGB 12.2*  HCT 34.7*  PLT 316  MCV 89.4  MCH 31.4  MCHC 35.2  RDW 14.4  LYMPHSABS 1.1  MONOABS 0.9  EOSABS  0.1  BASOSABS 0.0    Chemistries   Recent Labs Lab 01/27/16 1021 01/27/16 1440 01/27/16 1834 01/27/16 2229 01/28/16 0253 01/29/16 0745  NA 133* 131* 134* 138 137 132*  K 5.3* 6.1* 4.5 3.9 4.0 4.9  CL 101 98* 104 108 108 104  CO2 17* 13* 12* 19* 21* 15*  GLUCOSE 317* 580* 463* 144* 108* 571*  BUN 73* 73* 69* 63* 62* 45*  CREATININE 2.66* 2.75* 2.79* 2.39* 1.88* 2.00*  CALCIUM 9.1 8.9 8.7* 8.7* 8.2* 8.1*  AST 42*  --   --   --   --   --   ALT 40  --   --   --   --   --   ALKPHOS 85  --   --   --   --   --   BILITOT 1.3*  --   --   --   --   --     ------------------------------------------------------------------------------------------------------------------ No results for input(s): CHOL, HDL, LDLCALC, TRIG, CHOLHDL, LDLDIRECT in the last 72 hours.  Lab Results  Component Value Date   HGBA1C 9.5 07/26/2015   ------------------------------------------------------------------------------------------------------------------ No results for input(s): TSH, T4TOTAL, T3FREE, THYROIDAB in the last 72 hours.  Invalid input(s): FREET3 ------------------------------------------------------------------------------------------------------------------ No results for input(s): VITAMINB12, FOLATE, FERRITIN, TIBC, IRON, RETICCTPCT in the last 72 hours.  Coagulation profile No results for input(s): INR, PROTIME in the last 168 hours.  No results for input(s): DDIMER in the last 72 hours.  Cardiac Enzymes No results for input(s): CKMB, TROPONINI, MYOGLOBIN in the last 168 hours.  Invalid input(s): CK ------------------------------------------------------------------------------------------------------------------ No results found for: BNP  Micro Results Recent Results (from the past 240 hour(s))  Culture, Urine     Status: None   Collection Time: 01/27/16  8:40 AM  Result Value Ref Range Status   Specimen Description URINE, CLEAN CATCH  Final   Special Requests NONE  Final   Culture NO GROWTH Performed at [facility]   Final   Report Status 01/28/2016 FINAL  Final  MRSA PCR Screening     Status: None   Collection Time: 01/27/16  2:30 PM  Result Value Ref Range Status   MRSA by PCR NEGATIVE NEGATIVE Final    Comment:        The GeneXpert MRSA Assay (FDA approved for NASAL specimens only), is one component of a comprehensive MRSA colonization surveillance program. It is not intended to diagnose MRSA infection nor to guide or monitor treatment for MRSA infections.     Radiology Reports Dg Chest 2 View  01/27/2016   CLINICAL DATA:  Hyperglycemia the last 4-6 days. Unexplained weight loss. EXAM: CHEST  2 VIEW COMPARISON:  12/16/2015 FINDINGS: Heart is normal size. No confluent airspace opacities or effusions. Mediastinal contours within normal limits. No acute bony abnormality. IMPRESSION: No active cardiopulmonary disease. Electronically Signed   By: Charlett Nose M.D.   On: 01/27/2016 09:58    Time Spent in minutes  30   Benjamin Riddle K M.D on 01/29/2016 at 10:09 AM  Between 7am to 7pm - Pager - [phone]  After 7pm go to [email or web address] - password [facility]  [facility] -  Office  [phone]

## 2016-01-29 NOTE — Progress Notes (Signed)
Attempted to perform in and out cath at around 0900,procedure was unsuccessful,met resistance when advancing the catheter. Dr. Candiss Norse notified and told this nurse to order catheter that patient uses at home and that patient can self cath. Patient performed self cath at around 1140, obtained 500 cc. Patient told this Probation officer that he met resistance when putting the catheter, MD notified regarding this matter. Will continue to monitor.

## 2016-01-29 NOTE — Progress Notes (Signed)
Patient's CBG this am at 0726 was >600, MD notified, stat labs obtained and results reported to Dr. Thedore MinsSingh, also novolog  22 units given this am at 0750 and at 0945. Lantus 25 units administered at 0806, CBG rechecked at 0929- result was 477. At 1134,CBG was 302, 15 units of Novolog administered per order. At 1158 CBG was down to 278, events reported to Dr. Thedore MinsSingh. Will continue to monitor.

## 2016-01-29 NOTE — Progress Notes (Signed)
Notified MD of bladder scan of 431 cc, patient unable to void, order received for coude catheter, inserted by nurse at Urology floor.  Dr.  Thedore MinsSingh also ordered to check CBG at around 1550, result was 135, 3 units of Novolog administered per SSI and as ordered. Will continue to monitor.

## 2016-01-29 NOTE — Progress Notes (Addendum)
Inpatient Diabetes Program Recommendations  AACE/ADA: New Consensus Statement on Inpatient Glycemic Control (2015)  Target Ranges:  Prepandial:   less than 140 mg/dL      Peak postprandial:   less than 180 mg/dL (1-2 hours)      Critically ill patients:  140 - 180 mg/dL   Lab Results  Component Value Date   GLUCAP 302* 01/29/2016   HGBA1C 9.5 07/26/2015   Results for Benjamin Riddle, [name] (MRN [id number]) as of 01/29/2016 11:40  Ref. Range 01/29/2016 07:26 01/29/2016 07:28 01/29/2016 08:36 01/29/2016 09:29 01/29/2016 11:34  Glucose-Capillary Latest Ref Range: 65-99 mg/dL >865>600 (HH) >784>600 (HH) 696504 (HH) 477 (H) 302 (H)   Review of Glycemic Control  On GlucoStabilizer on 7/11 and transitioned to SQ insulin. Received Lantus 15 units at 0451. Did not receive any further Lantus on 7/11. 7/12 - CO2 15. AG - 13. Lantus 25 units ordered. On Novolog resistant tidwc and hs. Will likely need meal coverage insulin.  Inpatient Diabetes Program Recommendations:    Add Novolog 4 units tidwc for meal coverage. (Do not give if po intake < 50%) Need HgbA1C to assess glycemic control prior to hospitalization.  Will continue to follow. Thank you. Ailene Ardshonda Mete Purdum, RD, LDN, CDE Inpatient Diabetes Coordinator [phone]

## 2016-01-30 LAB — GLUCOSE, CAPILLARY
GLUCOSE-CAPILLARY: 107 mg/dL — AB (ref 65–99)
Glucose-Capillary: 140 mg/dL — ABNORMAL HIGH (ref 65–99)
Glucose-Capillary: 108 mg/dL — ABNORMAL HIGH (ref 65–99)
Glucose-Capillary: 365 mg/dL — ABNORMAL HIGH (ref 65–99)

## 2016-01-30 LAB — HEMOGLOBIN A1C
Hgb A1c MFr Bld: 11.1 % — ABNORMAL HIGH (ref 4.8–5.6)
MEAN PLASMA GLUCOSE: 272 mg/dL

## 2016-01-30 MED ORDER — PRO-STAT SUGAR FREE PO LIQD: 30.0000 mL | Freq: Three times a day (TID) | ORAL | Status: DC

## 2016-01-30 MED ORDER — INSULIN GLARGINE 300 UNIT/ML ~~LOC~~ SOPN: 12.0000 [IU] | PEN_INJECTOR | Freq: Every day | SUBCUTANEOUS | Status: DC

## 2016-01-30 MED ORDER — AMLODIPINE BESYLATE 5 MG PO TABS: 5.0000 mg | ORAL_TABLET | Freq: Every day | ORAL | Status: DC

## 2016-01-30 MED ORDER — CEFPODOXIME PROXETIL 200 MG PO TABS: 200.0000 mg | ORAL_TABLET | Freq: Two times a day (BID) | ORAL | Status: DC

## 2016-01-30 NOTE — Progress Notes (Signed)
Called report to Benjamin PikesSusan, Charity fundraiserN at [facility]

## 2016-01-30 NOTE — Discharge Summary (Signed)
Benjamin Riddle LOV:[id number] DOB: 07-Oct-1927 DOA: 01/27/2016  PCP: Ezequiel Kayser, MD  Admit date: 01/27/2016  Discharge date: 01/30/2016  Admitted From: Home   Disposition:  SNF   Recommendations for Outpatient Follow-up:   Follow up with PCP in 1-2 weeks  PCP Please obtain BMP/CBC, 2 view CXR in 1week,  (see Discharge instructions)   PCP Please follow up on the following pending results: Culture results   Home Health: None   Equipment/Devices: None  Discharge Condition: Fair    CODE STATUS: Full   Diet Recommendation: Heart Healthy, Low Carb Consultations: None  Chief Complaint  Patient presents with  . Hyperglycemia     Brief history of present illness from the day of admission and additional interim summary    Benjamin Riddle is a 82 y.o. male with medical history significant of 82 year old male with history of diabetes mellitus, COPD, previous DKA presents for elevated blood sugar and weakness. Patient report difficulty controlling blood sugar at home,earlythismorning was at 600-700. He is feeling weak, tired, no energy. He has been in bed most of the time this past weekend. He denies chest pain, dyspnea, abdominal pain. He does I and O cath BID to TID PRN. Drink fluids but not eating a lot.    ED Course: Presents with hyperglycemia, cbg 317, cr 2.6, k 5.3, bicarb 17, sodium 133, UA with too numerous to count WBC, positive for ketones, lactic acid 1.9. Chest x ray negative.      Hospital issues addressed    1.DM type II with mild DKA, DKA causing mild lactic acidosis and metabolic acidosis. Caused by UTI. Resolved after IV insulin and IV fluids, transitioned to Lantus and sliding scale A1c 11.1, increased Lantus/Tujeo, monitor CBGs QAC-QHS and adjust as needed.  2.UTI. Responded well to  Rocephin  will get 6 more days of Vantin, had Chronic Bladder outlet obstruction and use to self cath BID, now more severe obstruction needing Foley, follow with his Urologist Dr Isabel Caprice within 1 week.  3. Generalized weakness, cachectic appearance, appearance of severe PCM. Due to combination of 1 and 2, treat as 1 and 2, PT eval. will require SNF, will add protein supplementation. Will defer long-term management to PCP.  4. Dyslipidemia. On statin.  5. Hypertension. Placed on low-dose Norvasc.  6. CK D stage IV. Creatinine at baseline which is close to 2.  Discharge diagnosis     Active Problems:   Combined systolic and diastolic congestive heart failure (HCC)   DKA (diabetic ketoacidoses) (HCC)   Acute renal failure superimposed on stage 4 chronic kidney disease (HCC)   Bladder outlet obstruction   URI (upper respiratory infection)   Acute kidney injury (HCC)   Acidosis   UTI (lower urinary tract infection)   Metabolic acidosis    Discharge instructions    Discharge Instructions    Discharge instructions    Complete by:  As directed   Follow with Primary MD PERINI,MARK A, MD in 2-3 days   Get CBC, CMP, 2 view Chest X ray  checked  by Primary MD or SNF MD in 5-7 days ( we routinely change or add medications that can affect your baseline labs and fluid status, therefore we recommend that you get the mentioned basic workup next visit with your PCP, your PCP may decide not to get them or add new tests based on their clinical decision)   Activity: As tolerated with Full fall precautions use walker/cane & assistance as needed   Disposition SNF   Diet:   Heart Healthy Low carb.  Accuchecks 4 times/day, Once in AM empty stomach and then before each meal. Log in all results and show them to your Prim.MD in 3 days. If any glucose reading is under 80 or above 300 call your Prim MD immidiately. Follow Low glucose instructions for glucose under 80 as instructed.   For Heart failure  patients - Check your Weight same time everyday, if you gain over 2 pounds, or you develop in leg swelling, experience more shortness of breath or chest pain, call your Primary MD immediately. Follow Cardiac Low Salt Diet and 1.5 lit/day fluid restriction.   On your next visit with your primary care physician please Get Medicines reviewed and adjusted.   Please request your Prim.MD to go over all Hospital Tests and Procedure/Radiological results at the follow up, please get all Hospital records sent to your Prim MD by signing hospital release before you go home.   If you experience worsening of your admission symptoms, develop shortness of breath, life threatening emergency, suicidal or homicidal thoughts you must seek medical attention immediately by calling 911 or calling your MD immediately  if symptoms less severe.  You Must read complete instructions/literature along with all the possible adverse reactions/side effects for all the Medicines you take and that have been prescribed to you. Take any new Medicines after you have completely understood and accpet all the possible adverse reactions/side effects.   Do not drive, operate heavy machinery, perform activities at heights, swimming or participation in water activities or provide baby sitting services if your were admitted for syncope or siezures until you have seen by Primary MD or a Neurologist and advised to do so again.  Do not drive when taking Pain medications.    Do not take more than prescribed Pain, Sleep and Anxiety Medications  Special Instructions: If you have smoked or chewed Tobacco  in the last 2 yrs please stop smoking, stop any regular Alcohol  and or any Recreational drug use.  Wear Seat belts while driving.   Please note  You were cared for by a hospitalist during your hospital stay. If you have any questions about your discharge medications or the care you received while you were in the hospital after you are  discharged, you can call the unit and asked to speak with the hospitalist on call if the hospitalist that took care of you is not available. Once you are discharged, your primary care physician will handle any further medical issues. Please note that NO REFILLS for any discharge medications will be authorized once you are discharged, as it is imperative that you return to your primary care physician (or establish a relationship with a primary care physician if you do not have one) for your aftercare needs so that they can reassess your need for medications and monitor your lab values.     Increase activity slowly    Complete by:  As directed            Discharge Medications  Medication List    STOP taking these medications        cephALEXin 250 MG capsule  Commonly known as:  KEFLEX      TAKE these medications        albuterol (2.5 MG/3ML) 0.083% NEBU 3 mL, albuterol (5 MG/ML) 0.5% NEBU 0.5 mL  Inhale 5 mg into the lungs every 6 (six) hours as needed (wheezing).     albuterol 90 MCG/ACT inhaler  Commonly known as:  PROVENTIL,VENTOLIN  Inhale 2 puffs into the lungs every 4 (four) hours as needed for wheezing or shortness of breath.     amLODipine 5 MG tablet  Commonly known as:  NORVASC  Take 1 tablet (5 mg total) by mouth daily.     bismuth subsalicylate 262 MG chewable tablet  Commonly known as:  PEPTO BISMOL  Chew 262 mg by mouth 2 (two) times daily as needed for diarrhea or loose stools.     cefpodoxime 200 MG tablet  Commonly known as:  VANTIN  Take 1 tablet (200 mg total) by mouth 2 (two) times daily. 6 more days     cholecalciferol 1000 units tablet  Commonly known as:  VITAMIN D  Take 1,000 Units by mouth daily.     DULERA 100-5 MCG/ACT Aero  Generic drug:  mometasone-formoterol  Inhale 2 puffs into the lungs 2 (two) times daily.     feeding supplement (GLUCERNA SHAKE) Liqd  Take 237 mLs by mouth 3 (three) times daily between meals.     feeding supplement  (PRO-STAT SUGAR FREE 64) Liqd  Take 30 mLs by mouth 3 (three) times daily with meals.     insulin aspart 100 UNIT/ML injection  Commonly known as:  novoLOG  Inject 8-20 Units into the skin 3 (three) times daily before meals. If bs is >150 inject add 1 unit every 50     Insulin Glargine 300 UNIT/ML Sopn  Commonly known as:  TOUJEO SOLOSTAR  Inject 12-17 Units into the skin at bedtime.     saccharomyces boulardii 250 MG capsule  Commonly known as:  FLORASTOR  Take 1 capsule (250 mg total) by mouth 2 (two) times daily.     simvastatin 40 MG tablet  Commonly known as:  ZOCOR  Take 40 mg by mouth daily.        Allergies  Allergen Reactions  . Aspirin     unknown  . Bactrim [Sulfamethoxazole-Trimethoprim] Nausea And Vomiting  . Codeine Nausea And Vomiting  . Gentamicin     Becomes dizzy and weak  . Lexapro [Escitalopram Oxalate]     unknown  . Protonix [Pantoprazole Sodium]     unknown  . Ciprofloxacin Itching and Rash        Follow-up Information    Follow up with PERINI,MARK A, MD. Schedule an appointment as soon as possible for a visit in 3 days.   Specialty:  Internal Medicine   Contact information:   [address] [place] [address] [phone]       Follow up with Valetta Fuller, MD. Schedule an appointment as soon as possible for a visit in 3 days.   Specialty:  Urology   Contact information:   [address] [place] [address] [phone]       Major procedures and Radiology Reports - PLEASE review detailed and final reports thoroughly  -        Dg Chest 2 View  01/27/2016  CLINICAL DATA:  Hyperglycemia the last 4-6 days. Unexplained  weight loss. EXAM: CHEST  2 VIEW COMPARISON:  12/16/2015 FINDINGS: Heart is normal size. No confluent airspace opacities or effusions. Mediastinal contours within normal limits. No acute bony abnormality. IMPRESSION: No active cardiopulmonary disease. Electronically Signed   By: Charlett NoseKevin  Dover M.D.   On: 01/27/2016  09:58    Micro Results      Recent Results (from the past 240 hour(s))  Culture, Urine     Status: None   Collection Time: 01/27/16  8:40 AM  Result Value Ref Range Status   Specimen Description URINE, CLEAN CATCH  Final   Special Requests NONE  Final   Culture NO GROWTH [facility]   Final   Report Status 01/28/2016 FINAL  Final  MRSA PCR Screening     Status: None   Collection Time: 01/27/16  2:30 PM  Result Value Ref Range Status   MRSA by PCR NEGATIVE NEGATIVE Final    Comment:        The GeneXpert MRSA Assay (FDA approved for NASAL specimens only), is one component of a comprehensive MRSA colonization surveillance program. It is not intended to diagnose MRSA infection nor to guide or monitor treatment for MRSA infections.     Today   Subjective    Benjamin AranJerry Riddle today has no headache,no chest abdominal pain,no new weakness tingling or numbness, feels much better wants to go home today.     Objective   Blood pressure 114/72, pulse 66, temperature 98 F (36.7 C), temperature source Oral, resp. rate 18, height 5\' 9"  (1.753 m), weight 56.926 kg (125 lb 8 oz), SpO2 99 %.   Intake/Output Summary (Last 24 hours) at 01/30/16 0821 Last data filed at 01/30/16 [id number]  Gross per 24 hour  Intake 1147.83 ml  Output   3450 ml  Net -2302.17 ml    Exam Awake Alert, Oriented x 3, No new F.N deficits, Normal affect [place].AT,PERRAL Supple Neck,No JVD, No cervical lymphadenopathy appriciated.  Symmetrical Chest wall movement, Good air movement bilaterally, CTAB RRR,No Gallops,Rubs or new Murmurs, No Parasternal Heave +ve B.Sounds, Abd Soft, Non tender, No organomegaly appriciated, No rebound -guarding or rigidity. No Cyanosis, Clubbing or edema, No new Rash or bruise   Data Review   CBC w Diff: Lab Results  Component Value Date   WBC 6.8 01/27/2016   WBC 13.7 11/20/2015   HGB 12.2* 01/27/2016   HCT 34.7* 01/27/2016   PLT 316 01/27/2016    LYMPHOPCT 16 01/27/2016   MONOPCT 13 01/27/2016   EOSPCT 1 01/27/2016   BASOPCT 0 01/27/2016    CMP: Lab Results  Component Value Date   NA 132* 01/29/2016   NA 135* 11/20/2015   K 4.9 01/29/2016   CL 104 01/29/2016   CO2 15* 01/29/2016   BUN 45* 01/29/2016   BUN 40* 11/20/2015   CREATININE 2.00* 01/29/2016   CREATININE 2.1* 11/20/2015   GLU 48 11/20/2015   PROT 6.4* 01/27/2016   ALBUMIN 3.5 01/27/2016   BILITOT 1.3* 01/27/2016   ALKPHOS 85 01/27/2016   AST 42* 01/27/2016   ALT 40 01/27/2016  .  Lab Results  Component Value Date   HGBA1C 11.1* 01/29/2016   CBG (last 3)   Recent Labs  01/29/16 2147 01/30/16 0659 01/30/16 0737  GLUCAP 140* 108* 107*     Total Time in preparing paper work, data evaluation and todays exam - 35 minutes  Leroy SeaSINGH,Tiburcio Linder K M.D on 01/30/2016 at 8:21 AM  [facility]   Office  [phone]

## 2016-01-30 NOTE — Progress Notes (Signed)
Patient is set to discharge to [facility] today. Patient & spouse aware. Discharge packet given to RN, Hilton CorkEkua. Spouse will be transporting pt.   Stacy GardnerErin Lennix Kneisel, LCSWA Clinical Social Worker [phone]

## 2016-01-30 NOTE — Discharge Instructions (Signed)
Follow with Primary MD Rodrigo RanPERINI,MARK A, MD in 2-3 days   Get CBC, CMP, 2 view Chest X ray checked  by Primary MD or SNF MD in 5-7 days ( we routinely change or add medications that can affect your baseline labs and fluid status, therefore we recommend that you get the mentioned basic workup next visit with your PCP, your PCP may decide not to get them or add new tests based on their clinical decision)   Activity: As tolerated with Full fall precautions use walker/cane & assistance as needed   Disposition SNF   Diet:   Heart Healthy Low carb.  Accuchecks 4 times/day, Once in AM empty stomach and then before each meal. Log in all results and show them to your Prim.MD in 3 days. If any glucose reading is under 80 or above 300 call your Prim MD immidiately. Follow Low glucose instructions for glucose under 80 as instructed.   For Heart failure patients - Check your Weight same time everyday, if you gain over 2 pounds, or you develop in leg swelling, experience more shortness of breath or chest pain, call your Primary MD immediately. Follow Cardiac Low Salt Diet and 1.5 lit/day fluid restriction.   On your next visit with your primary care physician please Get Medicines reviewed and adjusted.   Please request your Prim.MD to go over all Hospital Tests and Procedure/Radiological results at the follow up, please get all Hospital records sent to your Prim MD by signing hospital release before you go home.   If you experience worsening of your admission symptoms, develop shortness of breath, life threatening emergency, suicidal or homicidal thoughts you must seek medical attention immediately by calling 911 or calling your MD immediately  if symptoms less severe.  You Must read complete instructions/literature along with all the possible adverse reactions/side effects for all the Medicines you take and that have been prescribed to you. Take any new Medicines after you have completely understood  and accpet all the possible adverse reactions/side effects.   Do not drive, operate heavy machinery, perform activities at heights, swimming or participation in water activities or provide baby sitting services if your were admitted for syncope or siezures until you have seen by Primary MD or a Neurologist and advised to do so again.  Do not drive when taking Pain medications.    Do not take more than prescribed Pain, Sleep and Anxiety Medications  Special Instructions: If you have smoked or chewed Tobacco  in the last 2 yrs please stop smoking, stop any regular Alcohol  and or any Recreational drug use.  Wear Seat belts while driving.   Please note  You were cared for by a hospitalist during your hospital stay. If you have any questions about your discharge medications or the care you received while you were in the hospital after you are discharged, you can call the unit and asked to speak with the hospitalist on call if the hospitalist that took care of you is not available. Once you are discharged, your primary care physician will handle any further medical issues. Please note that NO REFILLS for any discharge medications will be authorized once you are discharged, as it is imperative that you return to your primary care physician (or establish a relationship with a primary care physician if you do not have one) for your aftercare needs so that they can reassess your need for medications and monitor your lab values.

## 2016-01-30 NOTE — Care Management Important Message (Signed)
Important Message  Patient Details  Name: Benjamin Riddle MRN: [id number] Date of Birth: 04/22/1928   Medicare Important Message Given:  Yes    Haskell FlirtJamison, Robey Massmann 01/30/2016, 9:47 AMImportant Message  Patient Details  Name: Benjamin Riddle MRN: [id number] Date of Birth: 04/22/1928   Medicare Important Message Given:  Yes    Haskell FlirtJamison, Kalvin Buss 01/30/2016, 9:47 AM

## 2016-02-03 LAB — GLUCOSE, CAPILLARY: Glucose-Capillary: >600 mg/dL (ref 65–99)

## 2016-02-04 ENCOUNTER — Non-Acute Institutional Stay (SKILLED_NURSING_FACILITY): Payer: Medicare Other | Admitting: Internal Medicine

## 2016-02-04 ENCOUNTER — Encounter: Payer: Self-pay | Admitting: Internal Medicine

## 2016-02-04 DIAGNOSIS — N179 Acute kidney failure, unspecified: Secondary | ICD-10-CM

## 2016-02-04 DIAGNOSIS — N320 Bladder-neck obstruction: Secondary | ICD-10-CM

## 2016-02-04 DIAGNOSIS — I5040 Unspecified combined systolic (congestive) and diastolic (congestive) heart failure: Secondary | ICD-10-CM

## 2016-02-04 DIAGNOSIS — N390 Urinary tract infection, site not specified: Secondary | ICD-10-CM

## 2016-02-04 DIAGNOSIS — E0810 Diabetes mellitus due to underlying condition with ketoacidosis without coma: Secondary | ICD-10-CM | POA: Diagnosis not present

## 2016-02-04 DIAGNOSIS — E1010 Type 1 diabetes mellitus with ketoacidosis without coma: Secondary | ICD-10-CM

## 2016-02-04 NOTE — Progress Notes (Signed)
Patient ID: Benjamin Riddle, male   DOB: 01-18-1928, 82 y.o.   MRN: [id number]  Provider:  Gwenith Spitziffany L. Renato Gailseed, D.O., C.M.D. Location:   [facility] Room Number: Rehab 148 Place of Service:  SNF (31)  PCP: Ezequiel KayserPERINI,MARK A, MD Patient Care Team: Rodrigo RanMark Perini, MD as PCP - General (Internal Medicine) Barron Alvineavid Grapey, MD as Consulting Physician (Urology) Hali MarryMichael Altheimer, MD as Consulting Physician (Endocrinology) Jethro BolusMark Shapiro, MD as Consulting Physician (Ophthalmology) Cammy CopaScott Gregory Dean, MD as Consulting Physician (Orthopedic Surgery) Sharrell KuJeffrey Medoff, MD as Consulting Physician (Gastroenterology) Peter M SwazilandJordan, MD as Consulting Physician (Cardiology) Merwyn Katosavid B Simonds, MD as Consulting Physician (Pulmonary Disease)  Extended Emergency Contact Information Primary Emergency Contact: Lader,Ingrid Address[address]          [place] [id number] [place] Home Phone: [phone] Mobile Phone: [phone] Relation: Spouse  Code Status: Full code Goals of Care: Advanced Directive information Advanced Directives 02/04/2016  Does patient have an advance directive? No  Type of Advance Directive -  Copy of advanced directive(s) in chart? -  Pre-existing out of facility DNR order (yellow form or pink MOST form) -   Chief Complaint  Patient presents with  . Rehab admission    admission    HPI: Patient is a 82 y.o. male seen today for admission to rehab 7/13 s/p yet another hospitalization for DKA.  He had a fall in his independent living apt on 6/28.  He then fell again on 7/1 after self-catheterization.  He was noted to have decreased appetite.  CBG and vitals were actually ok at that time.  7/10, his wife called the [facility] nurse and reported his CBG was HI at 5am.  20 units of novolog were given with recheck on 376 at 730am.  His wife then reported that his readings had been HI for 6 days.  He was then weak and unable to get out of the bed on his own.  [facility] for DKA, AKI and urology had ot place a foley as they were meeting resistance during straight cath procedures.  He was noted to hae a bladder neck obstruction.  He is completing cefpodoxime today for uti.  Current  insulin orders are novolog 8 units tid ac meals plus 1 more unit for each 50 over 150 AND toujeo 15units qhs.  Hold insulin if cbg less than 100 and notify MD if over 450.  He is to f/u with NP Myrtie SomanWarden at [facility] urology on 7/18 and get cbc, bmp and chest xray on 7/20.  He should f/u with Dr. Waynard EdwardsPerini asap when he is discharged from rehab if he again goes home to [place].  Review of nursing notes indicated continued poor dietary choices leaded to hyperglycemia including potatoes and pasta with fruit, for example.  Past Medical History  Diagnosis Date  . Diabetes mellitus   . Asthma   . Hydronephrosis   . Kidney disease     Stage III chronic kidney disease  . Hypertension   . Neurogenic bladder   . Osteoporosis   . Hypogonadism male   . Recurrent urinary tract infection     With resistent Pseudomonas  . Allergic rhinitis   . Hepatitis C     due to blood transfusion  . Hyperlipidemia   . SOB (shortness of breath)   . CKD (chronic kidney disease), stage III   . Combined systolic and diastolic congestive heart failure (HCC)   .  COPD (chronic obstructive pulmonary disease) (HCC)   . Allergy   . Cataract   . Heart murmur    Past Surgical History  Procedure Laterality Date  . Hernia repair    . Tonsillectomy    . Bladder repair      obstruction surgery  . Frontal sinus obliteration      sinus repaired with a plate  . Cataract extraction  2015  . Eye surgery    . Fracture surgery    . Prostate surgery    . Spine surgery      reports that he quit smoking about 4 years ago. His smoking use included Cigars and Cigarettes. He has never used smokeless tobacco. He reports that he does not drink alcohol or use illicit drugs. Social History    Social History  . Marital Status: Married    Spouse Name: Jerene Dilling  . Number of Children: 0  . Years of Education: MD   Occupational History  . Retired Other   Social History Main Topics  . Smoking status: Former Smoker    Types: Cigars, Cigarettes    Quit date: 07/21/2011  . Smokeless tobacco: Never Used     Comment: smoked occ cigar and pipe  . Alcohol Use: No  . Drug Use: No  . Sexual Activity: Not on file   Other Topics Concern  . Not on file   Social History Narrative   Patient lives at home with spouse.   Caffeine Use: 3 cups daily    Family History  Problem Relation Age of Onset  . Heart failure Mother   . Hypertension Mother   . COPD Father     smoked  . Diabetes Father   . Cancer Sister     Breast  . Diabetes Sister   . COPD Brother     smoked    Health Maintenance  Topic Date Due  . FOOT EXAM  06/18/1940  . OPHTHALMOLOGY EXAM  01/28/2016  . INFLUENZA VACCINE  02/18/2016  . URINE MICROALBUMIN  07/25/2016  . HEMOGLOBIN A1C  07/31/2016  . TETANUS/TDAP  07/27/2021  . ZOSTAVAX  Completed  . PNA vac Low Risk Adult  Completed    Allergies  Allergen Reactions  . Aspirin     unknown  . Bactrim [Sulfamethoxazole-Trimethoprim] Nausea And Vomiting  . Codeine Nausea And Vomiting  . Gentamicin     Becomes dizzy and weak  . Lexapro [Escitalopram Oxalate]     unknown  . Protonix [Pantoprazole Sodium]     unknown  . Ciprofloxacin Itching and Rash      Medication List       This list is accurate as of: 02/04/16 11:51 AM.  Always use your most recent med list.               albuterol (2.5 MG/3ML) 0.083% nebulizer solution  Commonly known as:  PROVENTIL  Take 2.5 mg by nebulization every 6 (six) hours as needed for wheezing or shortness of breath.     albuterol 90 MCG/ACT inhaler  Commonly known as:  PROVENTIL,VENTOLIN  Inhale 2 puffs into the lungs every 4 (four) hours as needed for wheezing or shortness of breath.     amLODipine 5 MG  tablet  Commonly known as:  NORVASC  Take 1 tablet (5 mg total) by mouth daily.     bismuth subsalicylate 262 MG chewable tablet  Commonly known as:  PEPTO BISMOL  Chew 524 mg by mouth as needed.  cefpodoxime 200 MG tablet  Commonly known as:  VANTIN  Take 1 tablet (200 mg total) by mouth 2 (two) times daily. 6 more days     cholecalciferol 1000 units tablet  Commonly known as:  VITAMIN D  Take 1,000 Units by mouth daily.     DULERA 100-5 MCG/ACT Aero  Generic drug:  mometasone-formoterol  Inhale 2 puffs into the lungs 2 (two) times daily.     feeding supplement (GLUCERNA SHAKE) Liqd  Take 237 mLs by mouth 3 (three) times daily between meals.     feeding supplement (PRO-STAT SUGAR FREE 64) Liqd  Take 30 mLs by mouth 3 (three) times daily with meals.     insulin aspart 100 UNIT/ML injection  Commonly known as:  novoLOG  Inject 8-20 Units into the skin 3 (three) times daily before meals. If bs is >150 inject add 1 unit every 50     Insulin Glargine 300 UNIT/ML Sopn  Commonly known as:  TOUJEO SOLOSTAR  Inject 12-17 Units into the skin at bedtime.     saccharomyces boulardii 250 MG capsule  Commonly known as:  FLORASTOR  Take 250 mg by mouth daily.     simvastatin 40 MG tablet  Commonly known as:  ZOCOR  Take 40 mg by mouth daily.        Review of Systems  Constitutional: Negative for appetite change, chills and fever.       Cachectic; jaundice  HENT: Negative for congestion.   Respiratory: Negative for shortness of breath.   Cardiovascular: Negative for chest pain.  Gastrointestinal: Positive for abdominal distention. Negative for abdominal pain and constipation.  Endocrine: Positive for polydipsia and polyphagia. Negative for polyuria.  Genitourinary: Negative for dysuria.  Musculoskeletal: Negative for arthralgias.  Skin: Positive for color change.  Allergic/Immunologic:       DMI uncontrolled  Neurological: Positive for weakness. Negative for dizziness.   Psychiatric/Behavioral: Positive for confusion. Negative for agitation.    Filed Vitals:   02/04/16 1136  BP: 99/69  Pulse: 84  Temp: 98 F (36.7 C)  TempSrc: Oral  Resp: 18  Height:  (1.727 m)  Weight: 129 lb (58.514 kg)  SpO2: 97%   Body mass index is 19.62 kg/(m^2). Physical Exam  Constitutional: He is oriented to person, place, and time. No distress.  Chronically ill appearing male  HENT:  Head: Normocephalic and atraumatic.  Right Ear: External ear normal.  Left Ear: External ear normal.  Nose: Nose normal.  Mouth/Throat: Oropharynx is clear and moist.  Eyes: Conjunctivae are normal. Pupils are equal, round, and reactive to light.  glasses  Neck: Normal range of motion. Neck supple. No JVD present.  Cardiovascular: Normal rate and regular rhythm.   Pulmonary/Chest: Effort normal and breath sounds normal.  Abdominal: Soft. Bowel sounds are normal. He exhibits distension. He exhibits no mass. There is no tenderness. There is no rebound and no guarding.  Musculoskeletal: Normal range of motion. He exhibits no edema.  Lymphadenopathy:    He has no cervical adenopathy.  Neurological: He is alert and oriented to person, place, and time.  Skin: Skin is warm and dry.  Slightly jaundice appearing  Psychiatric:  Flat affect    Labs reviewed: Basic Metabolic Panel:  Recent Labs  96/29/52 2229 01/28/16 0253 01/29/16 0745  NA 138 137 132*  K 3.9 4.0 4.9  CL 108 108 104  CO2 19* 21* 15*  GLUCOSE 144* 108* 571*  BUN 63* 62* 45*  CREATININE 2.39* 1.88*  2.00*  CALCIUM 8.7* 8.2* 8.1*   Liver Function Tests:  Recent Labs  06/20/15 0446 11/06/15 2358 01/27/16 1021  AST 22 29 42*  ALT 14* 18 40  ALKPHOS 39 69 85  BILITOT 0.1* 0.5 1.3*  PROT 5.4* 7.5 6.4*  ALBUMIN 2.5* 4.0 3.5    Recent Labs  04/06/15 1745  LIPASE 41   No results for input(s): AMMONIA in the last 8760 hours. CBC:  Recent Labs  06/18/15 1635  11/06/15 2358  12/16/15 1110  12/18/15 0510 01/27/16 1021  WBC 4.7  < > 10.0  < > 14.5* 6.8 6.8  NEUTROABS 2.3  --  7.3  --   --   --  4.7  HGB 13.1  < > 13.3  < > 11.8* 11.8* 12.2*  HCT 39.5  < > 38.2*  < > 38.5* 35.2* 34.7*  MCV 94.5  < > 89.7  < > 100.8* 92.4 89.4  PLT 357  < > 327  < > 358 258 316  < > = values in this interval not displayed. Cardiac Enzymes: No results for input(s): CKTOTAL, CKMB, CKMBINDEX, TROPONINI in the last 8760 hours. BNP: Invalid input(s): POCBNP Lab Results  Component Value Date   HGBA1C 11.1* 01/29/2016   Lab Results  Component Value Date   TSH 1.241 06/21/2015   No results found for: VITAMINB12 No results found for: FOLATE No results found for: IRON, TIBC, FERRITIN  Imaging and Procedures obtained prior to SNF admission: Dg Chest 2 View  01/27/2016  CLINICAL DATA:  Hyperglycemia the last 4-6 days. Unexplained weight loss. EXAM: CHEST  2 VIEW COMPARISON:  12/16/2015 FINDINGS: Heart is normal size. No confluent airspace opacities or effusions. Mediastinal contours within normal limits. No acute bony abnormality. IMPRESSION: No active cardiopulmonary disease. Electronically Signed   By: Charlett Nose M.D.   On: 01/27/2016 09:58    Assessment/Plan 1. Diabetic ketoacidosis without coma associated with diabetes mellitus due to underlying condition (HCC) -resolved, but control remains very poor and dietary choices are lousy -novolog 8 units ac meals and one additional unit more per 50 over 150 -toujeo 15 units qhs and hold insulin for cbg <100 and call for cbg over 450  2. Uncontrolled type 1 diabetes mellitus with ketoacidosis without coma (HCC) -see above  3. Combined systolic and diastolic congestive heart failure, unspecified congestive heart failure chronicity (HCC) -chronic, stable  4. Bladder outlet obstruction -s/p foley, had been doing I/o caths at home and getting recurrent infections and was treated again with cefpodoxime through 7/18  5. Chronic UTI -see #4 -has  been educated on proper sterile technique -f/u labs with cbc, bmp 7/20 and keep appt with urology on 7/18  6. Acute kidney injury (HCC) -resolved with correction of glucose and hydration  Family/ staff Communication: discussed with rehab nursing  Labs/tests ordered:  Cbc, bmp, urology f/u, endocrine if control does not improve

## 2016-02-06 ENCOUNTER — Non-Acute Institutional Stay (SKILLED_NURSING_FACILITY): Payer: Medicare Other | Admitting: Adult Health

## 2016-02-06 ENCOUNTER — Encounter: Payer: Self-pay | Admitting: Adult Health

## 2016-02-06 DIAGNOSIS — S2232XA Fracture of one rib, left side, initial encounter for closed fracture: Secondary | ICD-10-CM

## 2016-02-06 DIAGNOSIS — J181 Lobar pneumonia, unspecified organism: Secondary | ICD-10-CM

## 2016-02-06 DIAGNOSIS — E1010 Type 1 diabetes mellitus with ketoacidosis without coma: Secondary | ICD-10-CM

## 2016-02-06 NOTE — Progress Notes (Signed)
Patient ID: Benjamin Riddle, male   DOB: 02-17-1928, 82 y.o.   MRN: [id number]\  Location:   [facility]    Place of Service:  SNF (31) Provider:   Peggye Ley, ANP [facility] [phone]   Ezequiel Kayser, MD  Patient Care Team: Rodrigo Ran, MD as PCP - General (Internal Medicine) Barron Alvine, MD as Consulting Physician (Urology) Hali Marry, MD as Consulting Physician (Endocrinology) Jethro Bolus, MD as Consulting Physician (Ophthalmology) Cammy Copa, MD as Consulting Physician (Orthopedic Surgery) Sharrell Ku, MD as Consulting Physician (Gastroenterology) Peter M [place], MD as Consulting Physician (Cardiology) Merwyn Katos, MD as Consulting Physician (Pulmonary Disease)  Extended Emergency Contact Information Primary Emergency Contact: Plate,Ingrid Address: [address]          [place] [address] [place] Home Phone: [phone] Mobile Phone: [phone] Relation: Spouse  Code Status:  Full code Goals of care: Advanced Directive information Advanced Directives 02/04/2016  Does patient have an advance directive? No  Type of Advance Directive -  Copy of advanced directive(s) in chart? -  Pre-existing out of facility DNR order (yellow form or pink MOST form) -     Chief Complaint  Patient presents with  . Acute Visit    CXR    HPI:  Pt is a 82 y.o. male seen today for an acute visit for CXR on 7/20 showing acute process in the left base possibly pna with healing rib fractures 8/9/10th on the left.  Atx vs pna. [place] 7/10-7/13 due to DKA and UTI.  He completed a course of Vantin on 7/19.  Urine culture showed no growth but he was on keflex at the time which he takes chronically for UTI prevention.  His blood sugars have been low in the am at times but over 500 many times in the evening. His wife is at the bedside and reports on a normal day his sugar may be 200-300 but "if it  gets over 500 he usually has an infection. He remains weak here in rehab and is receiving therapy.  His rib fractures are most likely due to falls he had prior to his hospitalization. He denies any pain to the left side or difficulty breathing. No cough or purulent sputum.    Past Medical History  Diagnosis Date  . Diabetes mellitus   . Asthma   . Hydronephrosis   . Kidney disease     Stage III chronic kidney disease  . Hypertension   . Neurogenic bladder   . Osteoporosis   . Hypogonadism male   . Recurrent urinary tract infection     With resistent Pseudomonas  . Allergic rhinitis   . Hepatitis C     due to blood transfusion  . Hyperlipidemia   . SOB (shortness of breath)   . CKD (chronic kidney disease), stage III   . Combined systolic and diastolic congestive heart failure (HCC)   . COPD (chronic obstructive pulmonary disease) (HCC)   . Allergy   . Cataract   . Heart murmur    Past Surgical History  Procedure Laterality Date  . Hernia repair    . Tonsillectomy    . Bladder repair      obstruction surgery  . Frontal sinus obliteration      sinus repaired with a plate  . Cataract extraction  2015  . Eye surgery    . Fracture surgery    . Prostate surgery    .  Spine surgery      Allergies  Allergen Reactions  . Aspirin     unknown  . Bactrim [Sulfamethoxazole-Trimethoprim] Nausea And Vomiting  . Codeine Nausea And Vomiting  . Gentamicin     Becomes dizzy and weak  . Lexapro [Escitalopram Oxalate]     unknown  . Protonix [Pantoprazole Sodium]     unknown  . Ciprofloxacin Itching and Rash      Medication List       This list is accurate as of: 02/06/16 10:56 AM.  Always use your most recent med list.               albuterol (2.5 MG/3ML) 0.083% nebulizer solution  Commonly known as:  PROVENTIL  Take 2.5 mg by nebulization every 6 (six) hours as needed for wheezing or shortness of breath.     albuterol 90 MCG/ACT inhaler  Commonly known as:   PROVENTIL,VENTOLIN  Inhale 2 puffs into the lungs every 4 (four) hours as needed for wheezing or shortness of breath.     amLODipine 5 MG tablet  Commonly known as:  NORVASC  Take 1 tablet (5 mg total) by mouth daily.     bismuth subsalicylate 262 MG chewable tablet  Commonly known as:  PEPTO BISMOL  Chew 524 mg by mouth as needed.     cefpodoxime 200 MG tablet  Commonly known as:  VANTIN  Take 1 tablet (200 mg total) by mouth 2 (two) times daily. 6 more days     cholecalciferol 1000 units tablet  Commonly known as:  VITAMIN D  Take 1,000 Units by mouth daily.     DULERA 100-5 MCG/ACT Aero  Generic drug:  mometasone-formoterol  Inhale 2 puffs into the lungs 2 (two) times daily.     feeding supplement (GLUCERNA SHAKE) Liqd  Take 237 mLs by mouth 3 (three) times daily between meals.     feeding supplement (PRO-STAT SUGAR FREE 64) Liqd  Take 30 mLs by mouth 3 (three) times daily with meals.     insulin aspart 100 UNIT/ML injection  Commonly known as:  novoLOG  Inject 8-20 Units into the skin 3 (three) times daily before meals. If bs is >150 inject add 1 unit every 50     Insulin Glargine 300 UNIT/ML Sopn  Commonly known as:  TOUJEO SOLOSTAR  Inject 12-17 Units into the skin at bedtime.     saccharomyces boulardii 250 MG capsule  Commonly known as:  FLORASTOR  Take 250 mg by mouth daily.     simvastatin 40 MG tablet  Commonly known as:  ZOCOR  Take 40 mg by mouth daily.        Review of Systems  Constitutional: Positive for activity change and fatigue. Negative for fever, chills, diaphoresis, appetite change and unexpected weight change.  HENT: Negative for congestion, postnasal drip, rhinorrhea, sinus pressure and sore throat.   Respiratory: Negative for cough, shortness of breath, wheezing and stridor.   Cardiovascular: Negative for chest pain, palpitations and leg swelling.  Gastrointestinal: Negative for abdominal pain, diarrhea, constipation and abdominal  distention.  Endocrine: Positive for polyphagia. Negative for polydipsia and polyuria.  Genitourinary: Negative for dysuria, frequency and difficulty urinating.       Self catheterizes   Musculoskeletal: Positive for gait problem. Negative for myalgias, back pain, joint swelling and arthralgias.  Neurological: Positive for weakness (general). Negative for dizziness, seizures, syncope, facial asymmetry, speech difficulty and headaches.  Hematological: Negative for adenopathy. Does not bruise/bleed easily.  Psychiatric/Behavioral:  Negative for behavioral problems, confusion and agitation.    Immunization History  Administered Date(s) Administered  . Influenza Split 04/19/2014  . Influenza-Unspecified 06/28/2015  . Pneumococcal Conjugate-13 10/26/2012, 05/24/2013, 07/20/2014  . Pneumococcal Polysaccharide-23 07/28/2011  . Td 07/21/2007, 07/28/2011  . Zoster 07/28/2011, 06/21/2012   Pertinent  Health Maintenance Due  Topic Date Due  . FOOT EXAM  06/18/1940  . OPHTHALMOLOGY EXAM  01/28/2016  . INFLUENZA VACCINE  02/18/2016  . URINE MICROALBUMIN  07/25/2016  . HEMOGLOBIN A1C  07/31/2016  . PNA vac Low Risk Adult  Completed   Fall Risk  04/23/2015  Falls in the past year? Yes  Number falls in past yr: 1   Functional Status Survey:    Filed Vitals:   02/06/16 1054  BP: 100/60  Pulse: 74  Temp: 98.6 F (37 C)  Resp: 18  SpO2: 97%   There is no weight on file to calculate BMI. Physical Exam  Constitutional: He is oriented to person, place, and time. No distress.  HENT:  Head: Normocephalic and atraumatic.  Neck: Normal range of motion. Neck supple. No JVD present. No tracheal deviation present. No thyromegaly present.  Cardiovascular: Normal rate and regular rhythm.   No murmur heard. Pulmonary/Chest: Effort normal. No respiratory distress. He has no wheezes. He has rales (left base).  Abdominal: Soft. Bowel sounds are normal. He exhibits no distension. There is no  tenderness.  Lymphadenopathy:    He has no cervical adenopathy.  Neurological: He is alert and oriented to person, place, and time. No cranial nerve deficit.  Skin: Skin is warm and dry. He is not diaphoretic.  Psychiatric: He has a normal mood and affect.    Labs reviewed:  Recent Labs  01/27/16 2229 01/28/16 0253 01/29/16 0745  NA 138 137 132*  K 3.9 4.0 4.9  CL 108 108 104  CO2 19* 21* 15*  GLUCOSE 144* 108* 571*  BUN 63* 62* 45*  CREATININE 2.39* 1.88* 2.00*  CALCIUM 8.7* 8.2* 8.1*    Recent Labs  06/20/15 0446 11/06/15 2358 01/27/16 1021  AST 22 29 42*  ALT 14* 18 40  ALKPHOS 39 69 85  BILITOT 0.1* 0.5 1.3*  PROT 5.4* 7.5 6.4*  ALBUMIN 2.5* 4.0 3.5    Recent Labs  06/18/15 1635  11/06/15 2358  12/16/15 1110 12/18/15 0510 01/27/16 1021  WBC 4.7  < > 10.0  < > 14.5* 6.8 6.8  NEUTROABS 2.3  --  7.3  --   --   --  4.7  HGB 13.1  < > 13.3  < > 11.8* 11.8* 12.2*  HCT 39.5  < > 38.2*  < > 38.5* 35.2* 34.7*  MCV 94.5  < > 89.7  < > 100.8* 92.4 89.4  PLT 357  < > 327  < > 358 258 316  < > = values in this interval not displayed. Lab Results  Component Value Date   TSH 1.241 06/21/2015   Lab Results  Component Value Date   HGBA1C 11.1* 01/29/2016   Lab Results  Component Value Date   CHOL 137 01/05/2015   HDL 48 01/05/2015   LDLCALC 59 01/05/2015   TRIG 148 01/05/2015   CHOLHDL 2.9 01/05/2015    Significant Diagnostic Results in last 30 days:  Dg Chest 2 View  01/27/2016  CLINICAL DATA:  Hyperglycemia the last 4-6 days. Unexplained weight loss. EXAM: CHEST  2 VIEW COMPARISON:  12/16/2015 FINDINGS: Heart is normal size. No confluent airspace opacities or effusions.  Mediastinal contours within normal limits. No acute bony abnormality. IMPRESSION: No active cardiopulmonary disease. Electronically Signed   By: Charlett NoseKevin  Dover M.D.   On: 01/27/2016 09:58    Assessment/Plan  1. Lobar pneumonia (HCC) See allergies Augmentin 875 mg BID for 7 days with  food IS q 2 hrs WA  2. Type 1 diabetes mellitus with ketoacidosis without coma (HCC) Reduce lower limit to hold novolog from 100 to 90 to reduce highs later in the day  Upper limit of Novolog coverage in the evening should not exceed 14 units, staff to call if CBG over 450 to reduce lows in the am Remains difficult to control which may improve with treatment for pna  3. Left rib fractures -due to fall prior to hospital admission -IS q 2 hrs WA  Family/ staff Communication: discussed with resident and staff  Labs/tests ordered:  CBC, BMP pending.

## 2016-02-12 ENCOUNTER — Encounter: Payer: Self-pay | Admitting: *Deleted

## 2016-02-13 ENCOUNTER — Encounter: Payer: Self-pay | Admitting: Adult Health

## 2016-02-13 NOTE — Progress Notes (Signed)
This encounter was created in error - please disregard.

## 2016-02-28 ENCOUNTER — Non-Acute Institutional Stay (SKILLED_NURSING_FACILITY): Payer: Medicare Other | Admitting: Adult Health

## 2016-02-28 DIAGNOSIS — E1065 Type 1 diabetes mellitus with hyperglycemia: Secondary | ICD-10-CM | POA: Diagnosis not present

## 2016-02-28 DIAGNOSIS — M546 Pain in thoracic spine: Secondary | ICD-10-CM | POA: Diagnosis not present

## 2016-02-28 DIAGNOSIS — F329 Major depressive disorder, single episode, unspecified: Secondary | ICD-10-CM

## 2016-02-28 DIAGNOSIS — F32A Depression, unspecified: Secondary | ICD-10-CM

## 2016-02-29 ENCOUNTER — Encounter: Payer: Self-pay | Admitting: Adult Health

## 2016-02-29 NOTE — Progress Notes (Signed)
Patient ID: Benjamin Riddle, male   DOB: Jan 08, 1928, 82 y.o.   MRN: [id number]   Location:   [facility]   Place of Service:  SNF (31) Provider:   Peggye Ley, ANP [facility] [phone]   Ezequiel Kayser, MD  Patient Care Team: Rodrigo Ran, MD as PCP - General (Internal Medicine) Barron Alvine, MD as Consulting Physician (Urology) Hali Marry, MD as Consulting Physician (Endocrinology) Jethro Bolus, MD as Consulting Physician (Ophthalmology) Cammy Copa, MD as Consulting Physician (Orthopedic Surgery) Sharrell Ku, MD as Consulting Physician (Gastroenterology) Peter M [place], MD as Consulting Physician (Cardiology) Merwyn Katos, MD as Consulting Physician (Pulmonary Disease)  Extended Emergency Contact Information Primary Emergency Contact: Sukhu,Ingrid Address: [address]          [place] [address] [place] Home Phone: [phone] Mobile Phone: [phone] Relation: Spouse  Code Status:  Full code Goals of care: Advanced Directive information Advanced Directives 02/04/2016  Does patient have an advance directive? No  Type of Advance Directive -  Copy of advanced directive(s) in chart? -  Would patient like information on creating an advanced directive? -  Pre-existing out of facility DNR order (yellow form or pink MOST form) -     Chief Complaint  Patient presents with  . Acute Visit    back pain, DM 1    HPI:  Pt is a 82 y.o. male seen today for an acute visit for right sided thoracic back pain and DM Type 1.    S/P fall on 8/5 with lumbar and thoracic xray showing thoracic muscle spasms, DDD, and very mild anterior wedging of T12, L1. No acute fractures.  The pain is in the mid back area, radiating to the right rated 7/10. Minimal relief with ultram and tylenol.  Staff reports that he forgets to ask for pain meds. He continues to work with PT and OT and remains ambulatory but un motivated, very flat affect.   Has foley cath, no change in bowel habits.  Noted hx of left rib fractures due to previous fall.   Started on Zoloft two weeks ago for lack of motivation and s/s of depression. He denies this but has a very flat affect, makes minimal conversation, and remains uninvolved in his care.  He has difficult to control DM Type 1 with a hx of lows in the am and significant highs later in the day.  He was previously followed by Dr. Leslie Dales with endocrinology.  CBGs range 96-390 in the am, 283-401 at lunch, 199-435 at supper. A1C in July 11.1.  Not compliant with diet.        Past Medical History:  Diagnosis Date  . Allergic rhinitis   . Allergy   . Asthma   . Cataract   . CKD (chronic kidney disease), stage III   . Combined systolic and diastolic congestive heart failure (HCC)   . COPD (chronic obstructive pulmonary disease) (HCC)   . Diabetes mellitus   . Heart murmur   . Hepatitis C    due to blood transfusion  . Hydronephrosis   . Hyperlipidemia   . Hypertension   . Hypogonadism male   . Kidney disease    Stage III chronic kidney disease  . Neurogenic bladder   . Osteoporosis   . Recurrent urinary tract infection    With resistent Pseudomonas  . SOB (shortness of breath)    Past Surgical History:  Procedure Laterality Date  . BLADDER REPAIR  obstruction surgery  . CATARACT EXTRACTION  2015  . EYE SURGERY    . FRACTURE SURGERY    . FRONTAL SINUS OBLITERATION     sinus repaired with a plate  . HERNIA REPAIR    . PROSTATE SURGERY    . SPINE SURGERY    . TONSILLECTOMY      Allergies  Allergen Reactions  . Aspirin     unknown  . Bactrim [Sulfamethoxazole-Trimethoprim] Nausea And Vomiting  . Codeine Nausea And Vomiting  . Gentamicin     Becomes dizzy and weak  . Lexapro [Escitalopram Oxalate]     unknown  . Protonix [Pantoprazole Sodium]     unknown  . Ciprofloxacin Itching and Rash      Medication List       Accurate as of 02/28/16 11:59 PM. Always use  your most recent med list.          albuterol (2.5 MG/3ML) 0.083% nebulizer solution Commonly known as:  PROVENTIL Take 2.5 mg by nebulization every 6 (six) hours as needed for wheezing or shortness of breath.   albuterol 90 MCG/ACT inhaler Commonly known as:  PROVENTIL,VENTOLIN Inhale 2 puffs into the lungs every 4 (four) hours as needed for wheezing or shortness of breath.   amLODipine 5 MG tablet Commonly known as:  NORVASC Take 1 tablet (5 mg total) by mouth daily.   bismuth subsalicylate 262 MG chewable tablet Commonly known as:  PEPTO BISMOL Chew 524 mg by mouth as needed.   cholecalciferol 1000 units tablet Commonly known as:  VITAMIN D Take 1,000 Units by mouth daily.   DULERA 100-5 MCG/ACT Aero Generic drug:  mometasone-formoterol Inhale 2 puffs into the lungs 2 (two) times daily.   feeding supplement (GLUCERNA SHAKE) Liqd Take 237 mLs by mouth 3 (three) times daily between meals.   feeding supplement (PRO-STAT SUGAR FREE 64) Liqd Take 30 mLs by mouth 3 (three) times daily with meals.   insulin aspart 100 UNIT/ML injection Commonly known as:  novoLOG Inject 8-20 Units into the skin 3 (three) times daily before meals. If bs is >150 inject add 1 unit every 50   Insulin Glargine 300 UNIT/ML Sopn Commonly known as:  TOUJEO SOLOSTAR Inject 12-17 Units into the skin at bedtime.   saccharomyces boulardii 250 MG capsule Commonly known as:  FLORASTOR Take 250 mg by mouth daily.   sertraline 50 MG tablet Commonly known as:  ZOLOFT Take 50 mg by mouth daily.   simvastatin 40 MG tablet Commonly known as:  ZOCOR Take 40 mg by mouth daily.   traMADol 50 MG tablet Commonly known as:  ULTRAM Take by mouth every 6 (six) hours as needed.       Review of Systems  Constitutional: Positive for activity change. Negative for appetite change, chills, diaphoresis, fatigue, fever and unexpected weight change.  Respiratory: Negative for cough, shortness of breath,  wheezing and stridor.   Cardiovascular: Negative for chest pain, palpitations and leg swelling.  Gastrointestinal: Negative for abdominal distention, abdominal pain, constipation and diarrhea.  Genitourinary: Negative for dysuria.       Has foley, occasional pink tinge to urine  Musculoskeletal: Positive for arthralgias, back pain and gait problem. Negative for joint swelling and myalgias.  Neurological: Positive for weakness. Negative for dizziness, seizures, syncope, facial asymmetry, speech difficulty and headaches.  Hematological: Negative for adenopathy. Does not bruise/bleed easily.  Psychiatric/Behavioral: Positive for dysphoric mood. Negative for agitation, behavioral problems, confusion, hallucinations, self-injury, sleep disturbance and suicidal ideas. The patient  is not nervous/anxious and is not hyperactive.        Memory loss    Immunization History  Administered Date(s) Administered  . Influenza Split 04/19/2014  . Influenza-Unspecified 06/28/2015  . Pneumococcal Conjugate-13 10/26/2012, 05/24/2013, 07/20/2014  . Pneumococcal Polysaccharide-23 07/28/2011  . Td 07/21/2007, 07/28/2011  . Zoster 07/28/2011, 06/21/2012   Pertinent  Health Maintenance Due  Topic Date Due  . FOOT EXAM  06/18/1940  . OPHTHALMOLOGY EXAM  01/28/2016  . INFLUENZA VACCINE  02/18/2016  . URINE MICROALBUMIN  07/25/2016  . HEMOGLOBIN A1C  07/31/2016  . PNA vac Low Risk Adult  Completed   Fall Risk  04/23/2015  Falls in the past year? Yes  Number falls in past yr: 1   Functional Status Survey:    Vitals:   02/28/16 1125  BP: (!) (P) 159/89  Pulse: 67  Resp: 20  Temp: 97 F (36.1 C)   There is no height or weight on file to calculate BMI. Physical Exam  Constitutional: He is oriented to person, place, and time. No distress.  thin  HENT:  Head: Normocephalic and atraumatic.  Neck: Normal range of motion. Neck supple. No JVD present. No thyromegaly present.  Pulmonary/Chest: Effort  normal. He has wheezes. He has no rales. He exhibits no tenderness.  Abdominal: Soft. Bowel sounds are normal.  Musculoskeletal: He exhibits no edema or tenderness.  Decrease ROM to the spine, winces in pain when ask to bend over. No bruising noted.    Neurological: He is alert and oriented to person, place, and time. He has normal reflexes.  Skin: He is not diaphoretic.    Labs reviewed:  Recent Labs  01/27/16 2229 01/28/16 0253 01/29/16 0745  NA 138 137 132*  K 3.9 4.0 4.9  CL 108 108 104  CO2 19* 21* 15*  GLUCOSE 144* 108* 571*  BUN 63* 62* 45*  CREATININE 2.39* 1.88* 2.00*  CALCIUM 8.7* 8.2* 8.1*    Recent Labs  06/20/15 0446 11/06/15 2358 01/27/16 1021  AST 22 29 42*  ALT 14* 18 40  ALKPHOS 39 69 85  BILITOT 0.1* 0.5 1.3*  PROT 5.4* 7.5 6.4*  ALBUMIN 2.5* 4.0 3.5    Recent Labs  06/18/15 1635  11/06/15 2358  12/16/15 1110 12/18/15 0510 01/27/16 1021  WBC 4.7  < > 10.0  < > 14.5* 6.8 6.8  NEUTROABS 2.3  --  7.3  --   --   --  4.7  HGB 13.1  < > 13.3  < > 11.8* 11.8* 12.2*  HCT 39.5  < > 38.2*  < > 38.5* 35.2* 34.7*  MCV 94.5  < > 89.7  < > 100.8* 92.4 89.4  PLT 357  < > 327  < > 358 258 316  < > = values in this interval not displayed. Lab Results  Component Value Date   TSH 1.241 06/21/2015   Lab Results  Component Value Date   HGBA1C 11.1 (H) 01/29/2016   Lab Results  Component Value Date   CHOL 137 01/05/2015   HDL 48 01/05/2015   LDLCALC 59 01/05/2015   TRIG 148 01/05/2015   CHOLHDL 2.9 01/05/2015    Significant Diagnostic Results in last 30 days:  No results found.  Assessment/Plan  1) Thoracic back pain -s/p fall pain level 7/10 -xray showing muscle spasm and slight anterior wedging at T12, where the pain is located -add robaxin 500 mg BID and prn muscle spasms -OT to work on positioning, also working  with PT -schedule tramadol 50 mg qam, report if excess sedation noted, resident forgets to ask for meds -if no improvement next  week would consider additional imaging  2) DM I uncontrolled -improved over the past two weeks but remains uncontrolled -non compliance an issue with hx of lows in the am -would continue same regimen and await endocrine input -staff reported that he previously saw Dr. Leslie Dales but he would like to see another endocrinologist at this time. -check A1C next month  3) Depression -no improvement at this time, continue current dose of zoloft and give it more time to take effect   Family/ staff Communication: discussed with staff and resident  Labs/tests ordered:  NA

## 2016-03-02 LAB — BASIC METABOLIC PANEL WITH GFR
BUN: 48 mg/dL — AB (ref 4–21)
Creatinine: 1.7 mg/dL — AB (ref 0.6–1.3)
Glucose: 121 mg/dL
Potassium: 4.6 mmol/L (ref 3.4–5.3)
Sodium: 140 mmol/L (ref 137–147)

## 2016-03-02 LAB — CBC AND DIFFERENTIAL
HCT: 35 % — AB (ref 41–53)
Hemoglobin: 12.2 g/dL — AB (ref 13.5–17.5)
Platelets: 469 K/µL — AB (ref 150–399)
WBC: 13.1 10*3/mL

## 2016-03-12 ENCOUNTER — Non-Acute Institutional Stay (SKILLED_NURSING_FACILITY): Payer: Medicare Other | Admitting: Adult Health

## 2016-03-12 ENCOUNTER — Encounter: Payer: Self-pay | Admitting: Adult Health

## 2016-03-12 DIAGNOSIS — J449 Chronic obstructive pulmonary disease, unspecified: Secondary | ICD-10-CM | POA: Diagnosis not present

## 2016-03-12 DIAGNOSIS — R413 Other amnesia: Secondary | ICD-10-CM | POA: Diagnosis not present

## 2016-03-12 DIAGNOSIS — E1065 Type 1 diabetes mellitus with hyperglycemia: Secondary | ICD-10-CM

## 2016-03-12 DIAGNOSIS — N320 Bladder-neck obstruction: Secondary | ICD-10-CM

## 2016-03-12 DIAGNOSIS — M546 Pain in thoracic spine: Secondary | ICD-10-CM | POA: Diagnosis not present

## 2016-03-12 DIAGNOSIS — E785 Hyperlipidemia, unspecified: Secondary | ICD-10-CM

## 2016-03-12 DIAGNOSIS — R296 Repeated falls: Secondary | ICD-10-CM | POA: Diagnosis not present

## 2016-03-12 DIAGNOSIS — N390 Urinary tract infection, site not specified: Secondary | ICD-10-CM | POA: Diagnosis not present

## 2016-03-12 DIAGNOSIS — IMO0002 Reserved for concepts with insufficient information to code with codable children: Secondary | ICD-10-CM | POA: Insufficient documentation

## 2016-03-12 NOTE — Progress Notes (Signed)
Patient ID: Benjamin Riddle, male   DOB: 04/24/1928, 82 y.o.   MRN: [id number]   Location:   [facility]   Place of Service:  SNF (31) Provider:   Peggye Leyhristy Mozell Haber, ANP [facility] [phone]   Ezequiel KayserPERINI,MARK A, MD  Patient Care Team: Rodrigo RanMark Perini, MD as PCP - General (Internal Medicine) Barron Alvineavid Grapey, MD as Consulting Physician (Urology) Hali MarryMichael Altheimer, MD as Consulting Physician (Endocrinology) Jethro BolusMark Shapiro, MD as Consulting Physician (Ophthalmology) Cammy CopaScott Gregory Dean, MD as Consulting Physician (Orthopedic Surgery) Sharrell KuJeffrey Medoff, MD as Consulting Physician (Gastroenterology) Peter M SwazilandJordan, MD as Consulting Physician (Cardiology) Merwyn Katosavid B Simonds, MD as Consulting Physician (Pulmonary Disease)  Extended Emergency Contact Information Primary Emergency Contact: Cragg,Ingrid Address[address]          [place] [id number] [place] Home Phone: [phone] Mobile Phone: [phone] Relation: Spouse  Code Status:  Full code Goals of care: Advanced Directive information Advanced Directives 03/12/2016  Does patient have an advance directive? Yes;No  Type of Advance Directive -  Copy of advanced directive(s) in chart? -  Would patient like information on creating an advanced directive? -  Pre-existing out of facility DNR order (yellow form or pink MOST form) -     Chief Complaint  Patient presents with  . Medical Management of Chronic Issues    HPI:  Pt is a 82 y.o. male seen today for management of chronic medical issues.   S/P fall on 8/5 with lumbar and thoracic xray showing thoracic muscle spasms, DDD, and very mild anterior wedging of T12, L1. No acute fractures.  Also has rib fractures on the right (noted at the 10th and 11 th ribs on 02/27/16) and the left (noted at the 8th, 9th, and 10th ribs on 02/06/16). Currently on scheduled ultram and robaxin because he forgets to ask for meds, improved per staff.  Started on Zoloft for lack of  motivation and dysphoric mood on 7/27.  Staff reports mild improvement in mood, resident denies this but lacks insight into his care.  He is a former Development worker, communityphysician.  Last MMSE 24/30, poor recall and difficulty with serial 7's.  Failed clock.  Reported constipation and now on mirlax with improvement.    Currently on Invaz IM 500 mg qd started on 8/20 due to positive urine culture for >100,000 colonies of Enterobacter.  Previously he was on keflex for UTI prevention.  Currently with an indwelling foley due to urinary retention/bladder outlet obstruction. Followed by Dr. Isabel CapriceGrapey.    Has CKD, last BUN/Cr 47.9/1.67.  Has Type 1 DM with a hx of DKA.  Non compliant with diet.  CBGS in the am 80-253, lunch 243-422, Supper 144-295, hs 171-231.  Last A1C in July of 2017  Was 11.1.    Hx of COPD GOLD stage II, on dulera.  Denies sob or cough.     Past Medical History:  Diagnosis Date  . Allergic rhinitis   . Allergy   . Asthma   . Cataract   . CKD (chronic kidney disease), stage III   . Combined systolic and diastolic congestive heart failure (HCC)   . COPD (chronic obstructive pulmonary disease) (HCC)   . Diabetes mellitus   . Heart murmur   . Hepatitis C    due to blood transfusion  . Hydronephrosis   . Hyperlipidemia   . Hypertension   . Hypogonadism male   . Kidney disease    Stage III chronic kidney disease  . Neurogenic bladder   .  Osteoporosis   . Recurrent urinary tract infection    With resistent Pseudomonas  . SOB (shortness of breath)    Past Surgical History:  Procedure Laterality Date  . BLADDER REPAIR     obstruction surgery  . CATARACT EXTRACTION  2015  . EYE SURGERY    . FRACTURE SURGERY    . FRONTAL SINUS OBLITERATION     sinus repaired with a plate  . HERNIA REPAIR    . PROSTATE SURGERY    . SPINE SURGERY    . TONSILLECTOMY      Allergies  Allergen Reactions  . Aspirin     unknown  . Bactrim [Sulfamethoxazole-Trimethoprim] Nausea And Vomiting  .  Codeine Nausea And Vomiting  . Gentamicin     Becomes dizzy and weak  . Lexapro [Escitalopram Oxalate]     unknown  . Protonix [Pantoprazole Sodium]     unknown  . Ciprofloxacin Itching and Rash      Medication List       Accurate as of 03/12/16  9:37 AM. Always use your most recent med list.          albuterol (2.5 MG/3ML) 0.083% nebulizer solution Commonly known as:  PROVENTIL Take 2.5 mg by nebulization every 6 (six) hours as needed for wheezing or shortness of breath.   albuterol 90 MCG/ACT inhaler Commonly known as:  PROVENTIL,VENTOLIN Inhale 2 puffs into the lungs every 4 (four) hours as needed for wheezing or shortness of breath.   amLODipine 5 MG tablet Commonly known as:  NORVASC Take 1 tablet (5 mg total) by mouth daily.   bismuth subsalicylate 262 MG chewable tablet Commonly known as:  PEPTO BISMOL Chew 524 mg by mouth as needed.   cholecalciferol 1000 units tablet Commonly known as:  VITAMIN D Take 1,000 Units by mouth daily.   DULERA 100-5 MCG/ACT Aero Generic drug:  mometasone-formoterol Inhale 2 puffs into the lungs 2 (two) times daily.   ertapenem 1 g injection Commonly known as:  INVANZ Inject 500 mg into the muscle daily.   feeding supplement (GLUCERNA SHAKE) Liqd Take 237 mLs by mouth 3 (three) times daily between meals.   feeding supplement (PRO-STAT SUGAR FREE 64) Liqd Take 30 mLs by mouth 3 (three) times daily with meals.   insulin aspart 100 UNIT/ML injection Commonly known as:  novoLOG Inject 8-20 Units into the skin 3 (three) times daily before meals. If bs is >150 inject add 1 unit every 50   Insulin Glargine 300 UNIT/ML Sopn Commonly known as:  TOUJEO SOLOSTAR Inject 12-17 Units into the skin at bedtime.   methocarbamol 500 MG tablet Commonly known as:  ROBAXIN Take 500 mg by mouth 2 (two) times daily. And q 8 hrs prn   polyethylene glycol packet Commonly known as:  MIRALAX / GLYCOLAX Take 17 g by mouth daily.     saccharomyces boulardii 250 MG capsule Commonly known as:  FLORASTOR Take 250 mg by mouth daily.   sertraline 50 MG tablet Commonly known as:  ZOLOFT Take 50 mg by mouth daily.   simvastatin 40 MG tablet Commonly known as:  ZOCOR Take 40 mg by mouth daily.   traMADol 50 MG tablet Commonly known as:  ULTRAM Take 50 mg by mouth every morning. And q 6 hr prn       Review of Systems  Constitutional: Negative for activity change, appetite change, chills, diaphoresis, fatigue, fever and unexpected weight change.  Respiratory: Negative for cough, shortness of breath, wheezing and stridor.  Cardiovascular: Negative for chest pain, palpitations and leg swelling.  Gastrointestinal: Negative for abdominal distention, abdominal pain, constipation, diarrhea, nausea and vomiting.  Endocrine: Negative for polydipsia, polyphagia and polyuria.  Genitourinary: Negative for difficulty urinating and dysuria.       Has foley  Musculoskeletal: Positive for arthralgias, back pain and gait problem. Negative for joint swelling and myalgias.  Neurological: Negative for dizziness, seizures, syncope, facial asymmetry, speech difficulty, weakness and headaches.  Hematological: Negative for adenopathy. Does not bruise/bleed easily.  Psychiatric/Behavioral: Positive for confusion and dysphoric mood. Negative for agitation, behavioral problems, hallucinations, self-injury, sleep disturbance and suicidal ideas. The patient is not nervous/anxious and is not hyperactive.        Memory loss    Immunization History  Administered Date(s) Administered  . Influenza Split 04/19/2014  . Influenza-Unspecified 06/28/2015  . Pneumococcal Conjugate-13 10/26/2012, 05/24/2013, 07/20/2014  . Pneumococcal Polysaccharide-23 07/28/2011  . Td 07/21/2007, 07/28/2011  . Zoster 07/28/2011, 06/21/2012   Pertinent  Health Maintenance Due  Topic Date Due  . FOOT EXAM  06/18/1938  . OPHTHALMOLOGY EXAM  01/28/2016  . INFLUENZA  VACCINE  02/18/2016  . URINE MICROALBUMIN  07/25/2016  . HEMOGLOBIN A1C  07/31/2016  . PNA vac Low Risk Adult  Completed   Fall Risk  04/23/2015  Falls in the past year? Yes  Number falls in past yr: 1   Functional Status Survey:    Vitals:   03/12/16 0931  BP: 126/77  Pulse: 76  Resp: 20  Temp: 97.9 F (36.6 C)  SpO2: 96%  Weight: 132 lb (59.9 kg)   Body mass index is 20.07 kg/m. Physical Exam  Constitutional: He is oriented to person, place, and time. No distress.  thin  HENT:  Head: Normocephalic and atraumatic.  Neck: Normal range of motion. Neck supple.  Cardiovascular: Normal rate and regular rhythm.   No murmur heard. No edema  Pulmonary/Chest: Effort normal. He has wheezes. He has no rales. He exhibits no tenderness.  Abdominal: Soft. Bowel sounds are normal.  Musculoskeletal: He exhibits no edema or tenderness.  Decrease ROM to the spine, some pain with rom of the spine but improved from previous visit. No pain with deep breath. No bruising noted.    Neurological: He is alert and oriented to person, place, and time. He has normal reflexes.  Skin: He is not diaphoretic.  Nursing note and vitals reviewed.   Labs reviewed:  Recent Labs  01/27/16 2229 01/28/16 0253 01/29/16 0745 03/02/16  NA 138 137 132* 140  K 3.9 4.0 4.9 4.6  CL 108 108 104  --   CO2 19* 21* 15*  --   GLUCOSE 144* 108* 571*  --   BUN 63* 62* 45* 48*  CREATININE 2.39* 1.88* 2.00* 1.7*  CALCIUM 8.7* 8.2* 8.1*  --     Recent Labs  06/20/15 0446 11/06/15 2358 01/27/16 1021  AST 22 29 42*  ALT 14* 18 40  ALKPHOS 39 69 85  BILITOT 0.1* 0.5 1.3*  PROT 5.4* 7.5 6.4*  ALBUMIN 2.5* 4.0 3.5    Recent Labs  06/18/15 1635  11/06/15 2358  12/16/15 1110 12/18/15 0510 01/27/16 1021 03/02/16  WBC 4.7  < > 10.0  < > 14.5* 6.8 6.8 13.1  NEUTROABS 2.3  --  7.3  --   --   --  4.7  --   HGB 13.1  < > 13.3  < > 11.8* 11.8* 12.2* 12.2*  HCT 39.5  < > 38.2*  < >  38.5* 35.2* 34.7* 35*    MCV 94.5  < > 89.7  < > 100.8* 92.4 89.4  --   PLT 357  < > 327  < > 358 258 316 469*  < > = values in this interval not displayed. Lab Results  Component Value Date   TSH 1.241 06/21/2015   Lab Results  Component Value Date   HGBA1C 11.1 (H) 01/29/2016   Lab Results  Component Value Date   CHOL 137 01/05/2015   HDL 48 01/05/2015   LDLCALC 59 01/05/2015   TRIG 148 01/05/2015   CHOLHDL 2.9 01/05/2015    Significant Diagnostic Results in last 30 days:  No results found.  Assessment/Plan  1. Right-sided thoracic back pain Improved s/p fall D/C scheduled robaxin and continue prn dosing Continue ultram q am and prn Continue IS   2. UTI (lower urinary tract infection) Complete invanz therapy and then resume ceftin afterwards F/U with Dr. Isabel Caprice this month  3. Bladder outlet obstruction Maintain foley and change monthly  4. HLD (hyperlipidemia) Check lipids  5. Frequent falls Due to memory loss and gait disturbance Prevent falls with bed alarm and mat   6. Memory loss Progressive Thin with difficulty maintaining weight, would avoid aricept and consider namenda  7. COPD GOLD III  Stable Continue Dulera 2 puffs BID  8. Uncontrolled type 1 diabetes mellitus with hyperglycemia (HCC) Continue Toujeo 15 units qday and novolog meal coverage Improved over all, await endocrine input   D/C scheduled robaxin and continue prn dosing Await input from endocrine  Family/ staff Communication: discussed with staff and resident  Labs/tests ordered: Lipids, CMP, CBC

## 2016-03-24 ENCOUNTER — Ambulatory Visit: Payer: Medicare Other | Admitting: Endocrinology

## 2016-03-28 ENCOUNTER — Encounter (HOSPITAL_COMMUNITY): Payer: Self-pay | Admitting: Nurse Practitioner

## 2016-03-28 ENCOUNTER — Emergency Department (HOSPITAL_COMMUNITY): Payer: Medicare Other

## 2016-03-28 ENCOUNTER — Emergency Department (HOSPITAL_COMMUNITY)
Admission: EM | Admit: 2016-03-28 | Discharge: 2016-03-29 | Disposition: A | Discharge status: Home / Self Care | Payer: Medicare Other | Attending: Emergency Medicine | Admitting: Emergency Medicine

## 2016-03-28 DIAGNOSIS — J449 Chronic obstructive pulmonary disease, unspecified: Secondary | ICD-10-CM | POA: Diagnosis not present

## 2016-03-28 DIAGNOSIS — R42 Dizziness and giddiness: Secondary | ICD-10-CM | POA: Diagnosis not present

## 2016-03-28 DIAGNOSIS — I130 Hypertensive heart and chronic kidney disease with heart failure and stage 1 through stage 4 chronic kidney disease, or unspecified chronic kidney disease: Secondary | ICD-10-CM | POA: Insufficient documentation

## 2016-03-28 DIAGNOSIS — W01119A Fall on same level from slipping, tripping and stumbling with subsequent striking against unspecified sharp object, initial encounter: Secondary | ICD-10-CM | POA: Insufficient documentation

## 2016-03-28 DIAGNOSIS — M542 Cervicalgia: Secondary | ICD-10-CM | POA: Insufficient documentation

## 2016-03-28 DIAGNOSIS — Z87891 Personal history of nicotine dependence: Secondary | ICD-10-CM | POA: Insufficient documentation

## 2016-03-28 DIAGNOSIS — Y999 Unspecified external cause status: Secondary | ICD-10-CM | POA: Diagnosis not present

## 2016-03-28 DIAGNOSIS — M546 Pain in thoracic spine: Secondary | ICD-10-CM | POA: Diagnosis present

## 2016-03-28 DIAGNOSIS — W19XXXA Unspecified fall, initial encounter: Secondary | ICD-10-CM

## 2016-03-28 DIAGNOSIS — Y929 Unspecified place or not applicable: Secondary | ICD-10-CM | POA: Insufficient documentation

## 2016-03-28 DIAGNOSIS — E1022 Type 1 diabetes mellitus with diabetic chronic kidney disease: Secondary | ICD-10-CM | POA: Insufficient documentation

## 2016-03-28 DIAGNOSIS — Z79899 Other long term (current) drug therapy: Secondary | ICD-10-CM | POA: Insufficient documentation

## 2016-03-28 DIAGNOSIS — I5041 Acute combined systolic (congestive) and diastolic (congestive) heart failure: Secondary | ICD-10-CM | POA: Insufficient documentation

## 2016-03-28 DIAGNOSIS — J45909 Unspecified asthma, uncomplicated: Secondary | ICD-10-CM | POA: Diagnosis not present

## 2016-03-28 DIAGNOSIS — Y9301 Activity, walking, marching and hiking: Secondary | ICD-10-CM | POA: Insufficient documentation

## 2016-03-28 DIAGNOSIS — Z794 Long term (current) use of insulin: Secondary | ICD-10-CM | POA: Insufficient documentation

## 2016-03-28 DIAGNOSIS — N184 Chronic kidney disease, stage 4 (severe): Secondary | ICD-10-CM | POA: Insufficient documentation

## 2016-03-28 NOTE — ED Provider Notes (Signed)
Medical screening examination/treatment/procedure(s) were conducted as a shared visit with non-physician practitioner(s) and myself.  I personally evaluated the patient during the encounter.   EKG Interpretation None      82 year old male with history of DM, memory loss, COPD who presents after fall, that occurred yesterday. C/o neck pain today. Neuro in tact. Well appearing in no acute distress. CT head and c-spine negative. Will discharge back to facility.    Lavera Guiseana Duo Jerson Furukawa, MD 03/28/16 2026

## 2016-03-28 NOTE — ED Provider Notes (Signed)
[facility]-EMERGENCY DEPT Provider Note   CSN: [id number] Arrival date & time: 03/28/16  1853     History   Chief Complaint Chief Complaint  Patient presents with  . Fall  . Neck Pain  . Back Pain    HPI Benjamin Riddle is a 82 y.o. male.  Patient who is a resident of [facility], history of chronic kidney disease, heart failure, COPD, diabetes, hypertension -- presents with complaint of neck pain after a fall occurring yesterday. Patient states that he was walking with a walker and tipped over landing on his side. He states that he hit his head. He was assisted by staff. Patient had neck pain starting after the fall. No headache, vomiting, weakness in arms or legs. He has taken Tylenol without improvement. Due to persistent pain, patient presents for evaluation tonight. He does not member feeling dizzy or lightheaded prior to the fall but states it may have been related to low blood sugar. No anticoagulation use. The onset of this condition was acute. The course is constant. Aggravating factors: movement. Alleviating factors: none.        Past Medical History:  Diagnosis Date  . Allergic rhinitis   . Allergy   . Asthma   . Cataract   . CKD (chronic kidney disease), stage III   . Combined systolic and diastolic congestive heart failure (HCC)   . COPD (chronic obstructive pulmonary disease) (HCC)   . Diabetes mellitus   . Heart murmur   . Hepatitis C    due to blood transfusion  . Hydronephrosis   . Hyperlipidemia   . Hypertension   . Hypogonadism male   . Kidney disease    Stage III chronic kidney disease  . Neurogenic bladder   . Osteoporosis   . Recurrent urinary tract infection    With resistent Pseudomonas  . SOB (shortness of breath)     Patient Active Problem List   Diagnosis Date Noted  . Memory loss 03/12/2016  . Diabetes mellitus type 1, uncontrolled (HCC) 03/12/2016  . UTI (lower urinary tract infection) 01/27/2016  . Type 1 diabetes mellitus with  ketoacidosis, uncontrolled (HCC) 01/19/2016  . Fall   . Bladder outlet obstruction 04/06/2015  . Protein-calorie malnutrition, severe (HCC) 01/25/2015  . DKA (diabetic ketoacidoses) (HCC) 01/23/2015  . Acute renal failure superimposed on stage 4 chronic kidney disease (HCC) 01/23/2015  . Combined systolic and diastolic congestive heart failure (HCC)   . COPD GOLD III  11/23/2014  . Asthmatic bronchitis , chronic (HCC) 08/31/2014  . HLD (hyperlipidemia) 12/09/2009    Past Surgical History:  Procedure Laterality Date  . BLADDER REPAIR     obstruction surgery  . CATARACT EXTRACTION  2015  . EYE SURGERY    . FRACTURE SURGERY    . FRONTAL SINUS OBLITERATION     sinus repaired with a plate  . HERNIA REPAIR    . PROSTATE SURGERY    . SPINE SURGERY    . TONSILLECTOMY         Home Medications    Prior to Admission medications   Medication Sig Start Date End Date Taking? Authorizing Provider  acetaminophen (TYLENOL) 500 MG tablet Take 500 mg by mouth every 8 (eight) hours as needed.    Historical Provider, MD  albuterol (PROVENTIL) (2.5 MG/3ML) 0.083% nebulizer solution Take 2.5 mg by nebulization every 6 (six) hours as needed for wheezing or shortness of breath.    Historical Provider, MD  albuterol (PROVENTIL,VENTOLIN) 90 MCG/ACT inhaler Inhale 2 puffs  into the lungs every 4 (four) hours as needed for wheezing or shortness of breath.     Historical Provider, MD  Amino Acids-Protein Hydrolys (FEEDING SUPPLEMENT, PRO-STAT SUGAR FREE 64,) LIQD Take 30 mLs by mouth 3 (three) times daily with meals. 01/30/16   Leroy SeaPrashant K Singh, MD  bismuth subsalicylate (PEPTO BISMOL) 262 MG chewable tablet Chew 524 mg by mouth as needed.    Historical Provider, MD  cholecalciferol (VITAMIN D) 1000 UNITS tablet Take 1,000 Units by mouth daily.    Historical Provider, MD  ertapenem [facility](INVANZ) 1 g injection Inject 500 mg into the muscle daily.    Historical Provider, MD  feeding supplement, GLUCERNA SHAKE,  (GLUCERNA SHAKE) LIQD Take 237 mLs by mouth 3 (three) times daily between meals. 12/19/15   Alison MurrayAlma M Devine, MD  insulin aspart (NOVOLOG) 100 UNIT/ML injection Inject 8-20 Units into the skin 3 (three) times daily before meals. If bs is >150 inject add 1 unit every 50    Historical Provider, MD  Insulin Glargine (TOUJEO SOLOSTAR) 300 UNIT/ML SOPN Inject 12-17 Units into the skin at bedtime. 01/30/16   Leroy SeaPrashant K Singh, MD  Menthol, Topical Analgesic, (BIOFREEZE EX) Apply 1 application topically 4 (four) times daily as needed.    Historical Provider, MD  methocarbamol (ROBAXIN) 500 MG tablet Take 500 mg by mouth 2 (two) times daily. And q 8 hrs prn    Historical Provider, MD  mometasone-formoterol (DULERA) 100-5 MCG/ACT AERO Inhale 2 puffs into the lungs 2 (two) times daily.    Historical Provider, MD  polyethylene glycol (MIRALAX / GLYCOLAX) packet Take 17 g by mouth daily.    Historical Provider, MD  saccharomyces boulardii (FLORASTOR) 250 MG capsule Take 250 mg by mouth daily.    Historical Provider, MD  sertraline (ZOLOFT) 50 MG tablet Take 50 mg by mouth daily.    Historical Provider, MD  simvastatin (ZOCOR) 40 MG tablet Take 40 mg by mouth daily.      Historical Provider, MD  traMADol (ULTRAM) 50 MG tablet Take 50 mg by mouth every morning. And q 6 hr prn    Historical Provider, MD    Family History Family History  Problem Relation Age of Onset  . Heart failure Mother   . Hypertension Mother   . COPD Father     smoked  . Diabetes Father   . Cancer Sister     Breast  . Diabetes Sister   . COPD Brother     smoked    Social History Social History  Substance Use Topics  . Smoking status: Former Smoker    Types: Cigars, Cigarettes    Quit date: 07/21/2011  . Smokeless tobacco: Never Used     Comment: smoked occ cigar and pipe  . Alcohol use No     Allergies   Aspirin; Bactrim [sulfamethoxazole-trimethoprim]; Codeine; Gentamicin; Lexapro [escitalopram oxalate]; Protonix [pantoprazole  sodium]; and Ciprofloxacin   Review of Systems Review of Systems  Constitutional: Negative for fatigue.  HENT: Negative for tinnitus.   Eyes: Negative for photophobia, pain and visual disturbance.  Respiratory: Negative for shortness of breath.   Cardiovascular: Negative for chest pain.  Gastrointestinal: Negative for nausea and vomiting.  Musculoskeletal: Positive for back pain (upper) and neck pain. Negative for gait problem.  Skin: Negative for wound.  Neurological: Negative for dizziness, weakness, light-headedness, numbness and headaches.  Psychiatric/Behavioral: Negative for confusion and decreased concentration.     Physical Exam Updated Vital Signs There were no vitals taken for this  visit.  Physical Exam  Constitutional: He is oriented to person, place, and time. He appears well-developed and well-nourished.  HENT:  Head: Normocephalic and atraumatic. Head is without raccoon's eyes and without Battle's sign.  Right Ear: Tympanic membrane, external ear and ear canal normal. No hemotympanum.  Left Ear: Tympanic membrane, external ear and ear canal normal. No hemotympanum.  Nose: Nose normal. No nasal septal hematoma.  Mouth/Throat: Oropharynx is clear and moist.  Eyes: Conjunctivae, EOM and lids are normal. Pupils are equal, round, and reactive to light.  No visible hyphema  Neck: Normal range of motion. Neck supple.  Cardiovascular: Normal rate and regular rhythm.   Pulmonary/Chest: Effort normal and breath sounds normal.  Abdominal: Soft. There is no tenderness.  Musculoskeletal: Normal range of motion.       Cervical back: He exhibits tenderness (L>R) and bony tenderness. He exhibits normal range of motion.       Thoracic back: He exhibits no tenderness and no bony tenderness.       Lumbar back: He exhibits no tenderness and no bony tenderness.  Neurological: He is alert and oriented to person, place, and time. He has normal strength and normal reflexes. No cranial  nerve deficit or sensory deficit. Coordination normal. GCS eye subscore is 4. GCS verbal subscore is 5. GCS motor subscore is 6.  Skin: Skin is warm and dry.  Psychiatric: He has a normal mood and affect.  Nursing note and vitals reviewed.    ED Treatments / Results   Radiology Ct Head Wo Contrast  Result Date: 03/28/2016 CLINICAL DATA:  Fall yesterday with bilateral neck pain. EXAM: CT HEAD WITHOUT CONTRAST CT CERVICAL SPINE WITHOUT CONTRAST TECHNIQUE: Multidetector CT imaging of the head and cervical spine was performed following the standard protocol without intravenous contrast. Multiplanar CT image reconstructions of the cervical spine were also generated. COMPARISON:  None. FINDINGS: CT HEAD FINDINGS Brain: No subdural, epidural, or subarachnoid hemorrhage. No mass, mass effect, or midline shift. Ventricular dilatation is stable. The cerebellum, brainstem, and basal cisterns are patent. No acute cortical ischemia or infarct identified. Vascular: Vascular calcifications. Skull: No acute fractures. Sinuses/Orbits: Previous surgery over the frontal sinuses. Low-attenuation foci in the overlapping soft tissues may represent surgical packing material, unchanged since November of 2016 making air in the soft tissues not likely. The paranasal sinuses are unchanged in configuration. The mastoid air cells and middle ears are stable. Other: No other abnormalities. CT CERVICAL SPINE FINDINGS Alignment: No traumatic malalignment Skull base and vertebrae: No fractures are identified. Multilevel degenerative changes. Soft tissues and spinal canal: No acute abnormalities. Upper chest: Normal Other: No other abnormalities IMPRESSION: 1. No acute intracranial process. 2. Ventricular dilatation is essentially stable. In the appropriate clinical setting, this could be seen with normal pressure hydrocephalus. 3. No fracture or traumatic malalignment in the cervical spine. Electronically Signed   By: Gerome Sam III  M.D   On: 03/28/2016 19:58   Ct Cervical Spine Wo Contrast  Result Date: 03/28/2016 CLINICAL DATA:  Fall yesterday with bilateral neck pain. EXAM: CT HEAD WITHOUT CONTRAST CT CERVICAL SPINE WITHOUT CONTRAST TECHNIQUE: Multidetector CT imaging of the head and cervical spine was performed following the standard protocol without intravenous contrast. Multiplanar CT image reconstructions of the cervical spine were also generated. COMPARISON:  None. FINDINGS: CT HEAD FINDINGS Brain: No subdural, epidural, or subarachnoid hemorrhage. No mass, mass effect, or midline shift. Ventricular dilatation is stable. The cerebellum, brainstem, and basal cisterns are patent. No acute cortical ischemia  or infarct identified. Vascular: Vascular calcifications. Skull: No acute fractures. Sinuses/Orbits: Previous surgery over the frontal sinuses. Low-attenuation foci in the overlapping soft tissues may represent surgical packing material, unchanged since November of 2016 making air in the soft tissues not likely. The paranasal sinuses are unchanged in configuration. The mastoid air cells and middle ears are stable. Other: No other abnormalities. CT CERVICAL SPINE FINDINGS Alignment: No traumatic malalignment Skull base and vertebrae: No fractures are identified. Multilevel degenerative changes. Soft tissues and spinal canal: No acute abnormalities. Upper chest: Normal Other: No other abnormalities IMPRESSION: 1. No acute intracranial process. 2. Ventricular dilatation is essentially stable. In the appropriate clinical setting, this could be seen with normal pressure hydrocephalus. 3. No fracture or traumatic malalignment in the cervical spine. Electronically Signed   By: Gerome Sam III M.D   On: 03/28/2016 19:58    Procedures Procedures (including critical care time)   Initial Impression / Assessment and Plan / ED Course  I have reviewed the triage vital signs and the nursing notes.  Pertinent labs & imaging results  that were available during my care of the patient were reviewed by me and considered in my medical decision making (see chart for details).  Clinical Course   Patient seen and examined. Work-up initiated. Medications ordered.   Vital signs reviewed and are as follows: BP 121/72 (BP Location: Right Arm)   Pulse 78   Temp 98.5 F (36.9 C) (Oral)   Resp 14   SpO2 94%   Patient d/w Dr. Verdie Mosher who has seen.   Plan: Conservative measures with PCP follow-up as needed.  Patient urged to follow-up with PCP if pain does not improve with treatment and rest or if pain becomes recurrent. Urged to return with worsening severe pain, loss of bowel or bladder control, trouble walking.    Final Clinical Impressions(s) / ED Diagnoses   Final diagnoses:  Neck pain  Fall, initial encounter   Neck pain/fall: Head and neck imaging are negative for acute fracture. Chronic changes as noted on head CT. Patient without any neuromuscular deficits on exam. Conservative measures indicated for treatment.   New Prescriptions New Prescriptions   No medications on file     Renne Crigler, PA-C 03/28/16 2032    Lavera Guise, MD 03/28/16 [phone]

## 2016-03-28 NOTE — ED Triage Notes (Signed)
Per EMS- Patient is a resident of [facility]. Patient had a fall yesterday and is now c/o bilateral neck pain, R>L and upper back pain.

## 2016-03-28 NOTE — Discharge Instructions (Signed)
Please read and follow all provided instructions.  Your diagnoses today include:  1. Neck pain   2. Fall, initial encounter     Tests performed today include:  Vital signs - see below for your results today  CT scan of your head and neck - no broken bones  Medications prescribed:   None  Take any prescribed medications only as directed.  Home care instructions:   Follow any educational materials contained in this packet  Please rest, use ice or heat on your neck for the next several days  Do not lift, push, pull anything more than 10 pounds for the next week  Follow-up instructions: Please follow-up with your primary care provider in the next 1 week for further evaluation of your symptoms if not improved.   Return instructions:  SEEK IMMEDIATE MEDICAL ATTENTION IF YOU HAVE:  New numbness, tingling, weakness, or problem with the use of your arms or legs  Severe back pain not relieved with medications  Loss control of your bowels or bladder  Increasing pain in any areas of the body (such as chest or abdominal pain)  Shortness of breath, dizziness, or fainting.   Worsening nausea (feeling sick to your stomach), vomiting, fever, or sweats  Any other emergent concerns regarding your health   Additional Information:  Your vital signs today were: BP 121/72 (BP Location: Right Arm)    Pulse 78    Temp 98.5 F (36.9 C) (Oral)    Resp 14    SpO2 94%  If your blood pressure (BP) was elevated above 135/85 this visit, please have this repeated by your doctor within one month. --------------

## 2016-03-28 NOTE — ED Notes (Signed)
Bed: [facility] Expected date:  Expected time:  Means of arrival:  Comments: 

## 2016-03-28 NOTE — ED Notes (Signed)
Patient transported to CT 

## 2016-03-29 ENCOUNTER — Emergency Department (HOSPITAL_COMMUNITY)
Admission: EM | Admit: 2016-03-29 | Discharge: 2016-03-29 | Disposition: A | Discharge status: Home / Self Care | Payer: Medicare Other | Attending: Emergency Medicine | Admitting: Emergency Medicine

## 2016-03-29 ENCOUNTER — Encounter (HOSPITAL_COMMUNITY): Payer: Self-pay | Admitting: Emergency Medicine

## 2016-03-29 DIAGNOSIS — N184 Chronic kidney disease, stage 4 (severe): Secondary | ICD-10-CM | POA: Insufficient documentation

## 2016-03-29 DIAGNOSIS — E1022 Type 1 diabetes mellitus with diabetic chronic kidney disease: Secondary | ICD-10-CM | POA: Diagnosis not present

## 2016-03-29 DIAGNOSIS — Z794 Long term (current) use of insulin: Secondary | ICD-10-CM | POA: Insufficient documentation

## 2016-03-29 DIAGNOSIS — J449 Chronic obstructive pulmonary disease, unspecified: Secondary | ICD-10-CM | POA: Insufficient documentation

## 2016-03-29 DIAGNOSIS — J45909 Unspecified asthma, uncomplicated: Secondary | ICD-10-CM | POA: Insufficient documentation

## 2016-03-29 DIAGNOSIS — I5040 Unspecified combined systolic (congestive) and diastolic (congestive) heart failure: Secondary | ICD-10-CM | POA: Diagnosis not present

## 2016-03-29 DIAGNOSIS — E1065 Type 1 diabetes mellitus with hyperglycemia: Secondary | ICD-10-CM | POA: Diagnosis present

## 2016-03-29 DIAGNOSIS — N390 Urinary tract infection, site not specified: Secondary | ICD-10-CM | POA: Diagnosis not present

## 2016-03-29 DIAGNOSIS — R739 Hyperglycemia, unspecified: Secondary | ICD-10-CM

## 2016-03-29 DIAGNOSIS — N189 Chronic kidney disease, unspecified: Secondary | ICD-10-CM

## 2016-03-29 DIAGNOSIS — Z87891 Personal history of nicotine dependence: Secondary | ICD-10-CM | POA: Insufficient documentation

## 2016-03-29 DIAGNOSIS — Z79899 Other long term (current) drug therapy: Secondary | ICD-10-CM | POA: Diagnosis not present

## 2016-03-29 DIAGNOSIS — I130 Hypertensive heart and chronic kidney disease with heart failure and stage 1 through stage 4 chronic kidney disease, or unspecified chronic kidney disease: Secondary | ICD-10-CM | POA: Insufficient documentation

## 2016-03-29 LAB — BASIC METABOLIC PANEL
Anion gap: 12 (ref 5–15)
BUN: 54 mg/dL — AB (ref 6–20)
CALCIUM: 9.3 mg/dL (ref 8.9–10.3)
CO2: 23 mmol/L (ref 22–32)
CREATININE: 2.02 mg/dL — AB (ref 0.61–1.24)
Chloride: 101 mmol/L (ref 101–111)
GFR calc non Af Amer: 28 mL/min — ABNORMAL LOW (ref >60)
GFR, EST AFRICAN AMERICAN: 33 mL/min — AB (ref >60)
Glucose, Bld: 384 mg/dL — ABNORMAL HIGH (ref 65–99)
Potassium: 4.3 mmol/L (ref 3.5–5.1)
SODIUM: 136 mmol/L (ref 135–145)

## 2016-03-29 LAB — CBC WITH DIFFERENTIAL/PLATELET
BASOS PCT: 1 %
Basophils Absolute: 0.1 10*3/uL (ref 0.0–0.1)
EOS ABS: 0.6 10*3/uL (ref 0.0–0.7)
EOS PCT: 6 %
HCT: 38.1 % — ABNORMAL LOW (ref 39.0–52.0)
HEMOGLOBIN: 12.6 g/dL — AB (ref 13.0–17.0)
Lymphocytes Relative: 16 %
Lymphs Abs: 1.8 10*3/uL (ref 0.7–4.0)
MCH: 30.4 pg (ref 26.0–34.0)
MCHC: 33.1 g/dL (ref 30.0–36.0)
MCV: 92 fL (ref 78.0–100.0)
MONOS PCT: 11 %
Monocytes Absolute: 1.2 10*3/uL — ABNORMAL HIGH (ref 0.1–1.0)
NEUTROS PCT: 66 %
Neutro Abs: 7.2 10*3/uL (ref 1.7–7.7)
PLATELETS: 565 10*3/uL — AB (ref 150–400)
RBC: 4.14 MIL/uL — AB (ref 4.22–5.81)
RDW: 12.9 % (ref 11.5–15.5)
WBC: 10.8 10*3/uL — AB (ref 4.0–10.5)

## 2016-03-29 LAB — URINALYSIS, ROUTINE W REFLEX MICROSCOPIC
BILIRUBIN URINE: NEGATIVE
Glucose, UA: >1000 mg/dL — AB
Ketones, ur: NEGATIVE mg/dL
NITRITE: NEGATIVE
PROTEIN: 30 mg/dL — AB
SPECIFIC GRAVITY, URINE: 1.016 (ref 1.005–1.030)
pH: 6 (ref 5.0–8.0)

## 2016-03-29 LAB — TROPONIN I

## 2016-03-29 LAB — CBG MONITORING, ED
GLUCOSE-CAPILLARY: 378 mg/dL — AB (ref 65–99)
Glucose-Capillary: 181 mg/dL — ABNORMAL HIGH (ref 65–99)
Glucose-Capillary: 154 mg/dL — ABNORMAL HIGH (ref 65–99)

## 2016-03-29 LAB — URINE MICROSCOPIC-ADD ON

## 2016-03-29 MED ORDER — SODIUM CHLORIDE 0.9 % IV BOLUS (SEPSIS)
1000.0000 mL | Freq: Once | INTRAVENOUS | Status: AC
  Administered 2016-03-29: 1000 mL via INTRAVENOUS

## 2016-03-29 MED ORDER — NITROFURANTOIN MONOHYD MACRO 100 MG PO CAPS: 100.0000 mg | ORAL_CAPSULE | Freq: Two times a day (BID) | ORAL | 0 refills | Status: DC

## 2016-03-29 MED ORDER — NITROFURANTOIN MONOHYD MACRO 100 MG PO CAPS
100.0000 mg | ORAL_CAPSULE | Freq: Once | ORAL | Status: AC
  Administered 2016-03-29: 100 mg via ORAL
  Filled 2016-03-29: qty 1

## 2016-03-29 MED ORDER — INSULIN ASPART 100 UNIT/ML ~~LOC~~ SOLN
10.0000 [IU] | Freq: Once | SUBCUTANEOUS | Status: AC
  Administered 2016-03-29: 10 [IU] via INTRAVENOUS
  Filled 2016-03-29: qty 1

## 2016-03-29 NOTE — ED Notes (Signed)
Discharge instructions, follow up care, and rx x1 reviewed with patient and Nursing Supervisor at [facility]. Nursing supervisor verbalized understanding.

## 2016-03-29 NOTE — ED Notes (Signed)
Bed: [id number] Expected date:  Expected time:  Means of arrival:  Comments: 82 yo hyperglycemia

## 2016-03-29 NOTE — ED Triage Notes (Signed)
Per EMS, nursing staff took his blood sugar 2 hours ago and it read high. Then, 28 units of novalog insulin was administered. [facility] [address]

## 2016-03-29 NOTE — ED Provider Notes (Signed)
[facility]-EMERGENCY DEPT Provider Note   CSN: [id number] Arrival date & time: 03/29/16  1323     History   Chief Complaint Chief Complaint  Patient presents with  . Hyperglycemia    HPI Makhai Fulco is a 82 y.o. male.  Pt was here last night for a fall and returned to the NH around 0100 today.  It is unclear if the pt received his evening insulin last night as he was here.  His blood sugar has been high today.  He was given 28 units of novalog earlier today.  The pt said that he feels fine.      Past Medical History:  Diagnosis Date  . Allergic rhinitis   . Allergy   . Asthma   . Cataract   . CKD (chronic kidney disease), stage III   . Combined systolic and diastolic congestive heart failure (HCC)   . COPD (chronic obstructive pulmonary disease) (HCC)   . Diabetes mellitus   . Heart murmur   . Hepatitis C    due to blood transfusion  . Hydronephrosis   . Hyperlipidemia   . Hypertension   . Hypogonadism male   . Kidney disease    Stage III chronic kidney disease  . Neurogenic bladder   . Osteoporosis   . Recurrent urinary tract infection    With resistent Pseudomonas  . SOB (shortness of breath)     Patient Active Problem List   Diagnosis Date Noted  . Memory loss 03/12/2016  . Diabetes mellitus type 1, uncontrolled (HCC) 03/12/2016  . UTI (lower urinary tract infection) 01/27/2016  . Type 1 diabetes mellitus with ketoacidosis, uncontrolled (HCC) 01/19/2016  . Fall   . Bladder outlet obstruction 04/06/2015  . Protein-calorie malnutrition, severe (HCC) 01/25/2015  . DKA (diabetic ketoacidoses) (HCC) 01/23/2015  . Acute renal failure superimposed on stage 4 chronic kidney disease (HCC) 01/23/2015  . Combined systolic and diastolic congestive heart failure (HCC)   . COPD GOLD III  11/23/2014  . Asthmatic bronchitis , chronic (HCC) 08/31/2014  . HLD (hyperlipidemia) 12/09/2009    Past Surgical History:  Procedure Laterality Date  . BLADDER REPAIR     obstruction surgery  . CATARACT EXTRACTION  2015  . EYE SURGERY    . FRACTURE SURGERY    . FRONTAL SINUS OBLITERATION     sinus repaired with a plate  . HERNIA REPAIR    . PROSTATE SURGERY    . SPINE SURGERY    . TONSILLECTOMY         Home Medications    Prior to Admission medications   Medication Sig Start Date End Date Taking? Authorizing Provider  acetaminophen (TYLENOL) 500 MG tablet Take 500 mg by mouth every 8 (eight) hours as needed for mild pain, moderate pain or headache.    Yes Historical Provider, MD  albuterol (PROVENTIL) (2.5 MG/3ML) 0.083% nebulizer solution Take 2.5 mg by nebulization every 6 (six) hours as needed for wheezing or shortness of breath.   Yes Historical Provider, MD  albuterol (PROVENTIL,VENTOLIN) 90 MCG/ACT inhaler Inhale 2 puffs into the lungs every 4 (four) hours as needed for wheezing or shortness of breath.    Yes Historical Provider, MD  bismuth subsalicylate (PEPTO BISMOL) 262 MG chewable tablet Chew 524 mg by mouth as needed for indigestion.    Yes Historical Provider, MD  cephALEXin (KEFLEX) 250 MG capsule Take 250 mg by mouth daily. Prophylaxis uti   Yes Historical Provider, MD  cholecalciferol (VITAMIN D) 1000 UNITS tablet Take  1,000 Units by mouth daily.   Yes Historical Provider, MD  feeding supplement, GLUCERNA SHAKE, (GLUCERNA SHAKE) LIQD Take 237 mLs by mouth 3 (three) times daily between meals. Patient taking differently: Take 237 mLs by mouth daily.  12/19/15  Yes Alison Murray, MD  insulin aspart (NOVOLOG) 100 UNIT/ML injection Inject 8-20 Units into the skin 3 (three) times daily before meals. If bs is >150 inject add 1 unit every 50   Yes Historical Provider, MD  Insulin Glargine (TOUJEO SOLOSTAR) 300 UNIT/ML SOPN Inject 12-17 Units into the skin at bedtime. Patient taking differently: Inject 15 Units into the skin at bedtime.  01/30/16  Yes Leroy Sea, MD  Menthol, Topical Analgesic, (BIOFREEZE EX) Apply 1 application topically 4  (four) times daily as needed.   Yes Historical Provider, MD  methocarbamol (ROBAXIN) 500 MG tablet Take 500 mg by mouth every 8 (eight) hours as needed for muscle spasms.    Yes Historical Provider, MD  mometasone-formoterol (DULERA) 100-5 MCG/ACT AERO Inhale 2 puffs into the lungs 2 (two) times daily.   Yes Historical Provider, MD  polyethylene glycol (MIRALAX / GLYCOLAX) packet Take 17 g by mouth daily.   Yes Historical Provider, MD  saccharomyces boulardii (FLORASTOR) 250 MG capsule Take 250 mg by mouth 2 (two) times daily.    Yes Historical Provider, MD  sertraline (ZOLOFT) 50 MG tablet Take 50 mg by mouth at bedtime.    Yes Historical Provider, MD  simvastatin (ZOCOR) 40 MG tablet Take 40 mg by mouth daily.     Yes Historical Provider, MD  traMADol (ULTRAM) 50 MG tablet Take 50 mg by mouth every morning.    Yes Historical Provider, MD  traMADol (ULTRAM) 50 MG tablet Take 50 mg by mouth every 6 (six) hours as needed for severe pain.   Yes Historical Provider, MD  Amino Acids-Protein Hydrolys (FEEDING SUPPLEMENT, PRO-STAT SUGAR FREE 64,) LIQD Take 30 mLs by mouth 3 (three) times daily with meals. Patient not taking: Reported on 03/28/2016 01/30/16   Leroy Sea, MD  nitrofurantoin, macrocrystal-monohydrate, (MACROBID) 100 MG capsule Take 1 capsule (100 mg total) by mouth 2 (two) times daily. 03/29/16   Jacalyn Lefevre, MD    Family History Family History  Problem Relation Age of Onset  . Heart failure Mother   . Hypertension Mother   . COPD Father     smoked  . Diabetes Father   . Cancer Sister     Breast  . Diabetes Sister   . COPD Brother     smoked    Social History Social History  Substance Use Topics  . Smoking status: Former Smoker    Types: Cigars, Cigarettes    Quit date: 07/21/2011  . Smokeless tobacco: Never Used     Comment: smoked occ cigar and pipe  . Alcohol use No     Allergies   Aspirin; Bactrim [sulfamethoxazole-trimethoprim]; Codeine; Gentamicin; Lexapro  [escitalopram oxalate]; Protonix [pantoprazole sodium]; and Ciprofloxacin   Review of Systems Review of Systems  Endocrine:       Elevated blood sugar  All other systems reviewed and are negative.    Physical Exam Updated Vital Signs BP 165/83   Pulse 73   Temp 98.4 F (36.9 C) (Oral)   Resp 18   Ht 5\' 9"  (1.753 m)   Wt 132 lb (59.9 kg)   SpO2 98%   BMI 19.49 kg/m   Physical Exam  Constitutional: He is oriented to person, place, and time. He  appears well-developed and well-nourished.  HENT:  Head: Normocephalic and atraumatic.  Right Ear: External ear normal.  Left Ear: External ear normal.  Nose: Nose normal.  Mouth/Throat: Oropharynx is clear and moist.  Eyes: Conjunctivae and EOM are normal. Pupils are equal, round, and reactive to light.  Neck: Normal range of motion. Neck supple.  Cardiovascular: Normal rate, regular rhythm, normal heart sounds and intact distal pulses.   Pulmonary/Chest: Effort normal and breath sounds normal.  Abdominal: Soft. Bowel sounds are normal.  Musculoskeletal: Normal range of motion.  Neurological: He is alert and oriented to person, place, and time.  Skin: Skin is warm and dry.  Psychiatric: He has a normal mood and affect. His behavior is normal. Judgment and thought content normal.  Nursing note and vitals reviewed.    ED Treatments / Results  Labs (all labs ordered are listed, but only abnormal results are displayed) Labs Reviewed  BASIC METABOLIC PANEL - Abnormal; Notable for the following:       Result Value   Glucose, Bld 384 (*)    BUN 54 (*)    Creatinine, Ser 2.02 (*)    GFR calc non Af Amer 28 (*)    GFR calc Af Amer 33 (*)    All other components within normal limits  CBC WITH DIFFERENTIAL/PLATELET - Abnormal; Notable for the following:    WBC 10.8 (*)    RBC 4.14 (*)    Hemoglobin 12.6 (*)    HCT 38.1 (*)    Platelets 565 (*)    Monocytes Absolute 1.2 (*)    All other components within normal limits    URINALYSIS, ROUTINE W REFLEX MICROSCOPIC (NOT [facility]) - Abnormal; Notable for the following:    APPearance TURBID (*)    Glucose, UA >1000 (*)    Hgb urine dipstick MODERATE (*)    Protein, ur 30 (*)    Leukocytes, UA LARGE (*)    All other components within normal limits  URINE MICROSCOPIC-ADD ON - Abnormal; Notable for the following:    Squamous Epithelial / LPF 0-5 (*)    Bacteria, UA MANY (*)    All other components within normal limits  CBG MONITORING, ED - Abnormal; Notable for the following:    Glucose-Capillary 378 (*)    All other components within normal limits  CBG MONITORING, ED - Abnormal; Notable for the following:    Glucose-Capillary 181 (*)    All other components within normal limits  URINE CULTURE  TROPONIN I  CBG MONITORING, ED  CBG MONITORING, ED    EKG  EKG Interpretation  Date/Time:  Sunday March 29 2016 13:39:33 EDT Ventricular Rate:  86 PR Interval:    QRS Duration: 117 QT Interval:  396 QTC Calculation: 474 R Axis:   59 Text Interpretation:  Sinus rhythm Nonspecific intraventricular conduction delay Anteroseptal infarct, age indeterminate Baseline wander in lead(s) II III aVL aVF V2 V4 V5 Confirmed by [facility] MD, Taydem Cavagnaro ([id number]) on 03/29/2016 2:07:22 PM       Radiology Ct Head Wo Contrast  Result Date: 03/28/2016 CLINICAL DATA:  Fall yesterday with bilateral neck pain. EXAM: CT HEAD WITHOUT CONTRAST CT CERVICAL SPINE WITHOUT CONTRAST TECHNIQUE: Multidetector CT imaging of the head and cervical spine was performed following the standard protocol without intravenous contrast. Multiplanar CT image reconstructions of the cervical spine were also generated. COMPARISON:  None. FINDINGS: CT HEAD FINDINGS Brain: No subdural, epidural, or subarachnoid hemorrhage. No mass, mass effect, or midline shift. Ventricular dilatation is  stable. The cerebellum, brainstem, and basal cisterns are patent. No acute cortical ischemia or infarct identified. Vascular:  Vascular calcifications. Skull: No acute fractures. Sinuses/Orbits: Previous surgery over the frontal sinuses. Low-attenuation foci in the overlapping soft tissues may represent surgical packing material, unchanged since November of 2016 making air in the soft tissues not likely. The paranasal sinuses are unchanged in configuration. The mastoid air cells and middle ears are stable. Other: No other abnormalities. CT CERVICAL SPINE FINDINGS Alignment: No traumatic malalignment Skull base and vertebrae: No fractures are identified. Multilevel degenerative changes. Soft tissues and spinal canal: No acute abnormalities. Upper chest: Normal Other: No other abnormalities IMPRESSION: 1. No acute intracranial process. 2. Ventricular dilatation is essentially stable. In the appropriate clinical setting, this could be seen with normal pressure hydrocephalus. 3. No fracture or traumatic malalignment in the cervical spine. Electronically Signed   By: Gerome Sam III M.D   On: 03/28/2016 19:58   Ct Cervical Spine Wo Contrast  Result Date: 03/28/2016 CLINICAL DATA:  Fall yesterday with bilateral neck pain. EXAM: CT HEAD WITHOUT CONTRAST CT CERVICAL SPINE WITHOUT CONTRAST TECHNIQUE: Multidetector CT imaging of the head and cervical spine was performed following the standard protocol without intravenous contrast. Multiplanar CT image reconstructions of the cervical spine were also generated. COMPARISON:  None. FINDINGS: CT HEAD FINDINGS Brain: No subdural, epidural, or subarachnoid hemorrhage. No mass, mass effect, or midline shift. Ventricular dilatation is stable. The cerebellum, brainstem, and basal cisterns are patent. No acute cortical ischemia or infarct identified. Vascular: Vascular calcifications. Skull: No acute fractures. Sinuses/Orbits: Previous surgery over the frontal sinuses. Low-attenuation foci in the overlapping soft tissues may represent surgical packing material, unchanged since November of 2016 making air  in the soft tissues not likely. The paranasal sinuses are unchanged in configuration. The mastoid air cells and middle ears are stable. Other: No other abnormalities. CT CERVICAL SPINE FINDINGS Alignment: No traumatic malalignment Skull base and vertebrae: No fractures are identified. Multilevel degenerative changes. Soft tissues and spinal canal: No acute abnormalities. Upper chest: Normal Other: No other abnormalities IMPRESSION: 1. No acute intracranial process. 2. Ventricular dilatation is essentially stable. In the appropriate clinical setting, this could be seen with normal pressure hydrocephalus. 3. No fracture or traumatic malalignment in the cervical spine. Electronically Signed   By: Gerome Sam III M.D   On: 03/28/2016 19:58    Procedures Procedures (including critical care time)  Medications Ordered in ED Medications  nitrofurantoin (macrocrystal-monohydrate) (MACROBID) capsule 100 mg (not administered)  sodium chloride 0.9 % bolus 1,000 mL (0 mLs Intravenous Stopped 03/29/16 1513)  insulin aspart (novoLOG) injection 10 Units (10 Units Intravenous Given 03/29/16 1400)     Initial Impression / Assessment and Plan / ED Course  I have reviewed the triage vital signs and the nursing notes.  Pertinent labs & imaging results that were available during my care of the patient were reviewed by me and considered in my medical decision making (see chart for details).  Clinical Course   Blood sugar has improved.  He has a UTI which looks like he gets frequently. He is on keflex for prophylaxis and is allergic to bactrim and cipro, so I will start him on macrobid and send his urine for a culture.  Pt ok for d/c.    Final Clinical Impressions(s) / ED Diagnoses   Final diagnoses:  Hyperglycemia  CRI (chronic renal insufficiency), unspecified stage  UTI (lower urinary tract infection)    New Prescriptions New Prescriptions   NITROFURANTOIN,  MACROCRYSTAL-MONOHYDRATE, (MACROBID) 100 MG  CAPSULE    Take 1 capsule (100 mg total) by mouth 2 (two) times daily.     Jacalyn Lefevre, MD 03/29/16 1534

## 2016-03-30 ENCOUNTER — Non-Acute Institutional Stay (SKILLED_NURSING_FACILITY): Payer: Medicare Other | Admitting: Adult Health

## 2016-03-30 ENCOUNTER — Encounter: Payer: Self-pay | Admitting: Adult Health

## 2016-03-30 DIAGNOSIS — N184 Chronic kidney disease, stage 4 (severe): Secondary | ICD-10-CM | POA: Diagnosis not present

## 2016-03-30 DIAGNOSIS — N390 Urinary tract infection, site not specified: Secondary | ICD-10-CM | POA: Diagnosis not present

## 2016-03-30 DIAGNOSIS — N320 Bladder-neck obstruction: Secondary | ICD-10-CM | POA: Diagnosis not present

## 2016-03-30 DIAGNOSIS — E1065 Type 1 diabetes mellitus with hyperglycemia: Secondary | ICD-10-CM | POA: Diagnosis not present

## 2016-03-30 DIAGNOSIS — T83511A Infection and inflammatory reaction due to indwelling urethral catheter, initial encounter: Secondary | ICD-10-CM | POA: Diagnosis not present

## 2016-03-30 LAB — BASIC METABOLIC PANEL
BUN: 48 mg/dL — AB (ref 4–21)
Creatinine: 1.8 mg/dL — AB (ref 0.6–1.3)
Glucose: 275 mg/dL
Potassium: 4.4 mmol/L (ref 3.4–5.3)
Sodium: 139 mmol/L (ref 137–147)

## 2016-03-30 LAB — CBC AND DIFFERENTIAL
HCT: 42 % (ref 41–53)
Hemoglobin: 14 g/dL (ref 13.5–17.5)
Platelets: 709 10*3/uL — AB (ref 150–399)
WBC: 13 10^3/mL

## 2016-03-30 NOTE — Progress Notes (Signed)
Patient ID: Vahe Pienta, male   DOB: 1927-10-24, 82 y.o.   MRN: [id number]  Location:   [facility]   Place of Service:  SNF (31) Provider:   Peggye Ley, ANP [facility] [phone]   Ezequiel Kayser, MD  Patient Care Team: Rodrigo Ran, MD as PCP - General (Internal Medicine) Barron Alvine, MD as Consulting Physician (Urology) Hali Marry, MD as Consulting Physician (Endocrinology) Jethro Bolus, MD as Consulting Physician (Ophthalmology) Cammy Copa, MD as Consulting Physician (Orthopedic Surgery) Sharrell Ku, MD as Consulting Physician (Gastroenterology) Peter M [place], MD as Consulting Physician (Cardiology) Merwyn Katos, MD as Consulting Physician (Pulmonary Disease)  Extended Emergency Contact Information Primary Emergency Contact: Chiquito,Ingrid Address: [address]          [place] [address] [place] Home Phone: [phone] Mobile Phone: [phone] Relation: Spouse  Code Status:  Full code Goals of care: Advanced Directive information Advanced Directives 03/29/2016  Does patient have an advance directive? No  Type of Advance Directive -  Copy of advanced directive(s) in chart? -  Would patient like information on creating an advanced directive? No - patient declined information  Pre-existing out of facility DNR order (yellow form or pink MOST form) -     Chief Complaint  Patient presents with  . Acute Visit    elevated cbg    HPI:  Pt is a 82 y.o. male seen today for an acute visit for CBG reading "high" after receiving 20 units of Novolog. He ate 100% of his breakfast according to the nurse. He is a Type 1 DM with labile sugars. Over the weekend he was sent out for a fall with neck pain on 9/9, CT of the head and neck were neg for acute findings.  On 9/10 he was sent to the ER with CBGs reading high and was diagnosed with a UTI and placed on macrobid.  He was recently treated for an Enterbacter UTI with  Invanz for 10 days. He has a chronic indwelling foley and followed by Urology.  He is currently alert but falls asleep easily, able to follow commands and has no complaints. No n, v, d.  No fever. VS stable except increased RR of 22-24 but denies CP or SOB.  Denies dysuria or abd pain as well.  He was given 20 units of rapid acting insulin and 30 min later the machine still read high. He was given another 20 units and the machine read 548. He feels better and is more alert. An IV was started of NS at 100cc/hr, urine output was 1600cc.     Since he has been to the ER twice over the weekend he is not inclined to return unless we can not control his sugar here.   Past Medical History:  Diagnosis Date  . Allergic rhinitis   . Allergy   . Asthma   . Cataract   . CKD (chronic kidney disease), stage III   . Combined systolic and diastolic congestive heart failure (HCC)   . COPD (chronic obstructive pulmonary disease) (HCC)   . Diabetes mellitus   . Heart murmur   . Hepatitis C    due to blood transfusion  . Hydronephrosis   . Hyperlipidemia   . Hypertension   . Hypogonadism male   . Kidney disease    Stage III chronic kidney disease  . Neurogenic bladder   . Osteoporosis   . Recurrent urinary tract infection    With resistent Pseudomonas  .  SOB (shortness of breath)    Past Surgical History:  Procedure Laterality Date  . BLADDER REPAIR     obstruction surgery  . CATARACT EXTRACTION  2015  . EYE SURGERY    . FRACTURE SURGERY    . FRONTAL SINUS OBLITERATION     sinus repaired with a plate  . HERNIA REPAIR    . PROSTATE SURGERY    . SPINE SURGERY    . TONSILLECTOMY      Allergies  Allergen Reactions  . Aspirin Other (See Comments)    Per patient states causes him to bleed easily   . Bactrim [Sulfamethoxazole-Trimethoprim] Nausea And Vomiting  . Codeine Nausea And Vomiting  . Gentamicin Other (See Comments)    Becomes dizzy and weak  . Lexapro [Escitalopram Oxalate] Other  (See Comments)    Unknown to patient  . Protonix [Pantoprazole Sodium] Diarrhea  . Ciprofloxacin Itching and Rash      Medication List       Accurate as of 03/30/16 11:41 AM. Always use your most recent med list.          acetaminophen 500 MG tablet Commonly known as:  TYLENOL Take 500 mg by mouth every 8 (eight) hours as needed for mild pain, moderate pain or headache.   albuterol (2.5 MG/3ML) 0.083% nebulizer solution Commonly known as:  PROVENTIL Take 2.5 mg by nebulization every 6 (six) hours as needed for wheezing or shortness of breath.   albuterol 90 MCG/ACT inhaler Commonly known as:  PROVENTIL,VENTOLIN Inhale 2 puffs into the lungs every 4 (four) hours as needed for wheezing or shortness of breath.   BIOFREEZE EX Apply 1 application topically 4 (four) times daily as needed.   bismuth subsalicylate 262 MG chewable tablet Commonly known as:  PEPTO BISMOL Chew 524 mg by mouth as needed for indigestion.   cephALEXin 250 MG capsule Commonly known as:  KEFLEX Take 250 mg by mouth daily. Prophylaxis uti   cholecalciferol 1000 units tablet Commonly known as:  VITAMIN D Take 1,000 Units by mouth daily.   DULERA 100-5 MCG/ACT Aero Generic drug:  mometasone-formoterol Inhale 2 puffs into the lungs 2 (two) times daily.   feeding supplement (GLUCERNA SHAKE) Liqd Take 237 mLs by mouth 3 (three) times daily between meals.   feeding supplement (PRO-STAT SUGAR FREE 64) Liqd Take 30 mLs by mouth 3 (three) times daily with meals.   insulin aspart 100 UNIT/ML injection Commonly known as:  novoLOG Inject 8-20 Units into the skin 3 (three) times daily before meals. If bs is >150 inject add 1 unit every 50   Insulin Glargine 300 UNIT/ML Sopn Commonly known as:  TOUJEO SOLOSTAR Inject 12-17 Units into the skin at bedtime.   methocarbamol 500 MG tablet Commonly known as:  ROBAXIN Take 500 mg by mouth every 8 (eight) hours as needed for muscle spasms.   nitrofurantoin  (macrocrystal-monohydrate) 100 MG capsule Commonly known as:  MACROBID Take 1 capsule (100 mg total) by mouth 2 (two) times daily.   polyethylene glycol packet Commonly known as:  MIRALAX / GLYCOLAX Take 17 g by mouth daily.   saccharomyces boulardii 250 MG capsule Commonly known as:  FLORASTOR Take 250 mg by mouth 2 (two) times daily.   sertraline 50 MG tablet Commonly known as:  ZOLOFT Take 50 mg by mouth at bedtime.   simvastatin 40 MG tablet Commonly known as:  ZOCOR Take 40 mg by mouth daily.   traMADol 50 MG tablet Commonly known as:  Janean Sark  Take 50 mg by mouth every morning.   traMADol 50 MG tablet Commonly known as:  ULTRAM Take 50 mg by mouth every 6 (six) hours as needed for severe pain.       Review of Systems  Constitutional: Positive for fatigue. Negative for activity change, appetite change, chills, diaphoresis, fever and unexpected weight change.  HENT: Negative for congestion.   Respiratory: Negative for cough, shortness of breath, wheezing and stridor.   Cardiovascular: Negative for chest pain, palpitations and leg swelling.  Gastrointestinal: Negative for abdominal distention, abdominal pain, constipation and diarrhea.  Genitourinary: Negative for difficulty urinating and dysuria.  Musculoskeletal: Positive for arthralgias and gait problem. Negative for back pain, joint swelling and myalgias.  Neurological: Positive for weakness. Negative for dizziness, seizures, syncope, facial asymmetry, speech difficulty and headaches.  Hematological: Negative for adenopathy. Does not bruise/bleed easily.  Psychiatric/Behavioral: Negative for agitation and behavioral problems.       Memory loss    Immunization History  Administered Date(s) Administered  . Influenza Split 04/19/2014  . Influenza-Unspecified 06/28/2015  . Pneumococcal Conjugate-13 10/26/2012, 05/24/2013, 07/20/2014  . Pneumococcal Polysaccharide-23 07/28/2011  . Td 07/21/2007, 07/28/2011  .  Zoster 07/28/2011, 06/21/2012   Pertinent  Health Maintenance Due  Topic Date Due  . FOOT EXAM  06/18/1940  . OPHTHALMOLOGY EXAM  01/28/2016  . INFLUENZA VACCINE  02/18/2016  . URINE MICROALBUMIN  07/25/2016  . HEMOGLOBIN A1C  07/31/2016  . PNA vac Low Risk Adult  Completed   Fall Risk  04/23/2015  Falls in the past year? Yes  Number falls in past yr: 1   Functional Status Survey:    Vitals:   03/30/16 1140  BP: 138/83  Pulse: 84  Resp: (!) 22  Temp: 97 F (36.1 C)  SpO2: 97%   There is no height or weight on file to calculate BMI. Physical Exam  Constitutional: No distress.  HENT:  Head: Normocephalic and atraumatic.  Neck: Normal range of motion. Neck supple. No JVD present. No tracheal deviation present. No thyromegaly present.  Cardiovascular: Normal rate and regular rhythm.   No murmur heard. Pulmonary/Chest: Breath sounds normal. He has no wheezes.  Slight increase work of breathing  Abdominal: Soft. Bowel sounds are normal. He exhibits no distension. There is no tenderness.  Musculoskeletal: He exhibits no edema or tenderness.  Lymphadenopathy:    He has no cervical adenopathy.  Neurological: He is alert. No cranial nerve deficit.  Oriented to self, place, and situation, not time. Follows commands  Skin: Skin is warm and dry. He is not diaphoretic.  Psychiatric:  Flat, falls asleep easily  Nursing note and vitals reviewed.   Labs reviewed:  Recent Labs  01/28/16 0253 01/29/16 0745 03/02/16 03/29/16 1345  NA 137 132* 140 136  K 4.0 4.9 4.6 4.3  CL 108 104  --  101  CO2 21* 15*  --  23  GLUCOSE 108* 571*  --  384*  BUN 62* 45* 48* 54*  CREATININE 1.88* 2.00* 1.7* 2.02*  CALCIUM 8.2* 8.1*  --  9.3    Recent Labs  06/20/15 0446 11/06/15 2358 01/27/16 1021  AST 22 29 42*  ALT 14* 18 40  ALKPHOS 39 69 85  BILITOT 0.1* 0.5 1.3*  PROT 5.4* 7.5 6.4*  ALBUMIN 2.5* 4.0 3.5    Recent Labs  11/06/15 2358  12/18/15 0510 01/27/16 1021 03/02/16  03/29/16 1345  WBC 10.0  < > 6.8 6.8 13.1 10.8*  NEUTROABS 7.3  --   --  4.7  --  7.2  HGB 13.3  < > 11.8* 12.2* 12.2* 12.6*  HCT 38.2*  < > 35.2* 34.7* 35* 38.1*  MCV 89.7  < > 92.4 89.4  --  92.0  PLT 327  < > 258 316 469* 565*  < > = values in this interval not displayed. Lab Results  Component Value Date   TSH 1.241 06/21/2015   Lab Results  Component Value Date   HGBA1C 11.1 (H) 01/29/2016   Lab Results  Component Value Date   CHOL 137 01/05/2015   HDL 48 01/05/2015   LDLCALC 59 01/05/2015   TRIG 148 01/05/2015   CHOLHDL 2.9 01/05/2015    Significant Diagnostic Results in last 30 days:  Ct Head Wo Contrast  Result Date: 03/28/2016 CLINICAL DATA:  Fall yesterday with bilateral neck pain. EXAM: CT HEAD WITHOUT CONTRAST CT CERVICAL SPINE WITHOUT CONTRAST TECHNIQUE: Multidetector CT imaging of the head and cervical spine was performed following the standard protocol without intravenous contrast. Multiplanar CT image reconstructions of the cervical spine were also generated. COMPARISON:  None. FINDINGS: CT HEAD FINDINGS Brain: No subdural, epidural, or subarachnoid hemorrhage. No mass, mass effect, or midline shift. Ventricular dilatation is stable. The cerebellum, brainstem, and basal cisterns are patent. No acute cortical ischemia or infarct identified. Vascular: Vascular calcifications. Skull: No acute fractures. Sinuses/Orbits: Previous surgery over the frontal sinuses. Low-attenuation foci in the overlapping soft tissues may represent surgical packing material, unchanged since November of 2016 making air in the soft tissues not likely. The paranasal sinuses are unchanged in configuration. The mastoid air cells and middle ears are stable. Other: No other abnormalities. CT CERVICAL SPINE FINDINGS Alignment: No traumatic malalignment Skull base and vertebrae: No fractures are identified. Multilevel degenerative changes. Soft tissues and spinal canal: No acute abnormalities. Upper chest:  Normal Other: No other abnormalities IMPRESSION: 1. No acute intracranial process. 2. Ventricular dilatation is essentially stable. In the appropriate clinical setting, this could be seen with normal pressure hydrocephalus. 3. No fracture or traumatic malalignment in the cervical spine. Electronically Signed   By: Gerome Samavid  Williams III M.D   On: 03/28/2016 19:58   Ct Cervical Spine Wo Contrast  Result Date: 03/28/2016 CLINICAL DATA:  Fall yesterday with bilateral neck pain. EXAM: CT HEAD WITHOUT CONTRAST CT CERVICAL SPINE WITHOUT CONTRAST TECHNIQUE: Multidetector CT imaging of the head and cervical spine was performed following the standard protocol without intravenous contrast. Multiplanar CT image reconstructions of the cervical spine were also generated. COMPARISON:  None. FINDINGS: CT HEAD FINDINGS Brain: No subdural, epidural, or subarachnoid hemorrhage. No mass, mass effect, or midline shift. Ventricular dilatation is stable. The cerebellum, brainstem, and basal cisterns are patent. No acute cortical ischemia or infarct identified. Vascular: Vascular calcifications. Skull: No acute fractures. Sinuses/Orbits: Previous surgery over the frontal sinuses. Low-attenuation foci in the overlapping soft tissues may represent surgical packing material, unchanged since November of 2016 making air in the soft tissues not likely. The paranasal sinuses are unchanged in configuration. The mastoid air cells and middle ears are stable. Other: No other abnormalities. CT CERVICAL SPINE FINDINGS Alignment: No traumatic malalignment Skull base and vertebrae: No fractures are identified. Multilevel degenerative changes. Soft tissues and spinal canal: No acute abnormalities. Upper chest: Normal Other: No other abnormalities IMPRESSION: 1. No acute intracranial process. 2. Ventricular dilatation is essentially stable. In the appropriate clinical setting, this could be seen with normal pressure hydrocephalus. 3. No fracture or  traumatic malalignment in the cervical spine. Electronically Signed  By: Gerome Sam III M.D   On: 03/28/2016 19:58    Assessment/Plan  1. Uncontrolled type 1 diabetes mellitus with hyperglycemia (HCC) Treated with rapid acting insulin as above. Elevated reading most likely due to UTI but will check CXR as well.  Continue NS at 100cc/hr at this time. Check BMP stat and then again in the am, as well as CBC. Will monitor CBGs closely and treat accordingly.  Continue Toujeo at 15 units due to am lows. CBG when I left this evening was 83 and he was getting ready to eat dinner. They will check a post prandial sugar and call me for additional orders.  2. Urinary tract infection associated with catheterization of urinary tract, initial encounter D/C Macrobid due to renal function, begin Invanz 500 mg IM qd for 7 days Hold keflex while on Invanz F/U with urology for recurrent UTI  3. Chronic kidney disease (CKD), stage IV (severe) (HCC) IVF for hydration, avoid nephrotoxic agents  4. Bladder outlet obstruction Maintain foley to gravity, f/u with urology as above  I spent 75 min on this case >50% in counseling and coordination with the resident and staff.   Family/ staff Communication: discussed with staff and resident  Labs/tests ordered: CBC, BMP, CXR

## 2016-03-31 ENCOUNTER — Encounter: Payer: Self-pay | Admitting: Internal Medicine

## 2016-03-31 ENCOUNTER — Non-Acute Institutional Stay (SKILLED_NURSING_FACILITY): Payer: Medicare Other | Admitting: Internal Medicine

## 2016-03-31 DIAGNOSIS — E1065 Type 1 diabetes mellitus with hyperglycemia: Secondary | ICD-10-CM

## 2016-03-31 DIAGNOSIS — I5040 Unspecified combined systolic (congestive) and diastolic (congestive) heart failure: Secondary | ICD-10-CM | POA: Diagnosis not present

## 2016-03-31 DIAGNOSIS — E0810 Diabetes mellitus due to underlying condition with ketoacidosis without coma: Secondary | ICD-10-CM | POA: Diagnosis not present

## 2016-03-31 DIAGNOSIS — N320 Bladder-neck obstruction: Secondary | ICD-10-CM

## 2016-03-31 DIAGNOSIS — N184 Chronic kidney disease, stage 4 (severe): Secondary | ICD-10-CM

## 2016-03-31 DIAGNOSIS — E43 Unspecified severe protein-calorie malnutrition: Secondary | ICD-10-CM | POA: Diagnosis not present

## 2016-03-31 LAB — BASIC METABOLIC PANEL
BUN: 40 mg/dL — AB (ref 4–21)
Creatinine: 1.6 mg/dL — AB (ref 0.6–1.3)
GLUCOSE: 108 mg/dL
POTASSIUM: 4.5 mmol/L (ref 3.4–5.3)
Sodium: 141 mmol/L (ref 137–147)

## 2016-03-31 NOTE — Progress Notes (Signed)
Patient ID: Benjamin Riddle, male   DOB: 08/19/1927, 82 y.o.   MRN: [id number]  Location:    [facility] Room Number: 126 Place of Service:  SNF (31) Provider:  Starsha Morning L. Renato Gails, D.O., C.M.D.  Ezequiel Kayser, MD  Patient Care Team: Rodrigo Ran, MD as PCP - General (Internal Medicine) Barron Alvine, MD as Consulting Physician (Urology) Hali Marry, MD as Consulting Physician (Endocrinology) Jethro Bolus, MD as Consulting Physician (Ophthalmology) Cammy Copa, MD as Consulting Physician (Orthopedic Surgery) Sharrell Ku, MD as Consulting Physician (Gastroenterology) Peter M [place], MD as Consulting Physician (Cardiology) Merwyn Katos, MD as Consulting Physician (Pulmonary Disease)  Extended Emergency Contact Information Primary Emergency Contact: Latu,Ingrid Address: [address]          [place] [address] [place] Home Phone: [phone] Mobile Phone: [phone] Relation: Spouse  Code Status:  Full code Goals of care: Advanced Directive information Advanced Directives 03/29/2016  Does patient have an advance directive? No  Type of Advance Directive -  Copy of advanced directive(s) in chart? -  Would patient like information on creating an advanced directive? No - patient declined information  Pre-existing out of facility DNR order (yellow form or pink MOST form) -   Chief Complaint  Patient presents with  . Acute Visit    DKA    HPI:  Pt is a 82 y.o. male seen today for an acute visit for f/u on labs.  Dr. Reather Laurence is an 82 yo male with h/o chronic hep c, DMI that is very poorly controlled with dietary indiscretions, recurrent UTIs with BPH and bladder outlet obstruction (had been doing I/O caths).    NP note reviewed from yesterday:  "Pt is a 82 y.o. male seen today for an acute visit for CBG reading "high" after receiving 20 units of Novolog. He ate 100% of his breakfast according to the nurse. He is a Type 1 DM with  labile sugars. Over the weekend he was sent out for a fall with neck pain on 9/9, CT of the head and neck were neg for acute findings.  On 9/10 he was sent to the ER with CBGs reading high and was diagnosed with a UTI and placed on macrobid.  He was recently treated for an Enterbacter UTI with Invanz for 10 days. He has a chronic indwelling foley and followed by Urology.  He is currently alert but falls asleep easily, able to follow commands and has no complaints. No n, v, d.  No fever. VS stable except increased RR of 22-24 but denies CP or SOB.  Denies dysuria or abd pain as well. He was given 20 units of rapid acting insulin and 30 min later the machine still read high. He was given another 20 units and the machine read 548. He feels better and is more alert. An IV was started of NS at 100cc/hr, urine output was 1600cc.    Since he has been to the ER twice over the weekend he is not inclined to return unless we can not control his sugar here."  Labs were ordered for tomorrow not today so I don't know if they are any better.  When I saw him, he was reading in bed. He reported feeling better than yesterday.  He was more alert than in Christy's note.  He was on his 3rd liter of normal saline.  He remains weak.  His appetite is better and he is back to eating foods he should not with  his DMI.  His CXR showed a possible RLL infiltrate vs. Atelectasis.  His lungs are clear on exam.  He is back on invanz since yesterday.  IVFs continued pending the labwork.  CBGs today much better for him at 250 this am and 327 at lunch.  He was having good output via foley.  Past Medical History:  Diagnosis Date  . Allergic rhinitis   . Allergy   . Asthma   . Cataract   . CKD (chronic kidney disease), stage III   . Combined systolic and diastolic congestive heart failure (HCC)   . COPD (chronic obstructive pulmonary disease) (HCC)   . Diabetes mellitus   . Heart murmur   . Hepatitis C    due to blood transfusion  .  Hydronephrosis   . Hyperlipidemia   . Hypertension   . Hypogonadism male   . Kidney disease    Stage III chronic kidney disease  . Neurogenic bladder   . Osteoporosis   . Recurrent urinary tract infection    With resistent Pseudomonas  . SOB (shortness of breath)    Past Surgical History:  Procedure Laterality Date  . BLADDER REPAIR     obstruction surgery  . CATARACT EXTRACTION  2015  . EYE SURGERY    . FRACTURE SURGERY    . FRONTAL SINUS OBLITERATION     sinus repaired with a plate  . HERNIA REPAIR    . PROSTATE SURGERY    . SPINE SURGERY    . TONSILLECTOMY      Allergies  Allergen Reactions  . Aspirin Other (See Comments)    Per patient states causes him to bleed easily   . Bactrim [Sulfamethoxazole-Trimethoprim] Nausea And Vomiting  . Codeine Nausea And Vomiting  . Gentamicin Other (See Comments)    Becomes dizzy and weak  . Lexapro [Escitalopram Oxalate] Other (See Comments)    Unknown to patient  . Protonix [Pantoprazole Sodium] Diarrhea  . Ciprofloxacin Itching and Rash      Medication List       Accurate as of 03/31/16 12:59 PM. Always use your most recent med list.          acetaminophen 500 MG tablet Commonly known as:  TYLENOL Take 500 mg by mouth every 8 (eight) hours as needed for mild pain, moderate pain or headache.   albuterol (2.5 MG/3ML) 0.083% nebulizer solution Commonly known as:  PROVENTIL Take 2.5 mg by nebulization every 6 (six) hours as needed for wheezing or shortness of breath.   albuterol 90 MCG/ACT inhaler Commonly known as:  PROVENTIL,VENTOLIN Inhale 2 puffs into the lungs every 4 (four) hours as needed for wheezing or shortness of breath.   BIOFREEZE EX Apply 1 application topically 4 (four) times daily as needed.   bismuth subsalicylate 262 MG chewable tablet Commonly known as:  PEPTO BISMOL Chew 524 mg by mouth as needed for indigestion.   cephALEXin 250 MG capsule Commonly known as:  KEFLEX HOLD WHILE ON INVANZ     cholecalciferol 1000 units tablet Commonly known as:  VITAMIN D Take 1,000 Units by mouth daily.   DULERA 100-5 MCG/ACT Aero Generic drug:  mometasone-formoterol Inhale 2 puffs into the lungs 2 (two) times daily.   feeding supplement (GLUCERNA SHAKE) Liqd Take 237 mLs by mouth daily.   insulin aspart 100 UNIT/ML injection Commonly known as:  novoLOG Inject 8-14 Units into the skin 3 (three) times daily before meals. If bs is >150 inject add 1 unit every 50  INVANZ IJ 500mg  IM once daily for 14 days   methocarbamol 500 MG tablet Commonly known as:  ROBAXIN Take 500 mg by mouth every 8 (eight) hours as needed for muscle spasms.   nitrofurantoin (macrocrystal-monohydrate) 100 MG capsule Commonly known as:  MACROBID Take 100 mg by mouth 2 (two) times daily.   polyethylene glycol packet Commonly known as:  MIRALAX / GLYCOLAX Take 17 g by mouth daily.   saccharomyces boulardii 250 MG capsule Commonly known as:  FLORASTOR Take 250 mg by mouth 2 (two) times daily.   sertraline 50 MG tablet Commonly known as:  ZOLOFT Take 50 mg by mouth at bedtime.   simvastatin 40 MG tablet Commonly known as:  ZOCOR Take 40 mg by mouth daily.   TOUJEO SOLOSTAR 300 UNIT/ML Sopn Generic drug:  Insulin Glargine Inject 15 Units into the skin at bedtime.   traMADol 50 MG tablet Commonly known as:  ULTRAM Take 50 mg by mouth every morning.   traMADol 50 MG tablet Commonly known as:  ULTRAM Take 50 mg by mouth every 6 (six) hours as needed for severe pain.       Review of Systems  Constitutional: Positive for activity change, appetite change and fatigue. Negative for chills and fever.  HENT: Negative for congestion and sinus pressure.   Eyes: Negative for visual disturbance.       Glasses  Respiratory: Negative for cough, chest tightness, shortness of breath and wheezing.   Cardiovascular: Negative for chest pain, palpitations and leg swelling.  Gastrointestinal: Positive for  abdominal distention. Negative for abdominal pain, blood in stool, constipation, diarrhea, nausea and vomiting.  Endocrine:       Uncontrolled DMI  Genitourinary:       Foley in place  Musculoskeletal: Positive for gait problem. Negative for arthralgias.       Walks with walker  Skin:       Chronic jaundice appearance  Neurological: Positive for weakness. Negative for dizziness.  Psychiatric/Behavioral: Negative for agitation, behavioral problems, confusion and sleep disturbance.    Immunization History  Administered Date(s) Administered  . Influenza Split 04/19/2014  . Influenza-Unspecified 06/28/2015  . Pneumococcal Conjugate-13 10/26/2012, 05/24/2013, 07/20/2014  . Pneumococcal Polysaccharide-23 07/28/2011  . Td 07/21/2007, 07/28/2011  . Zoster 07/28/2011, 06/21/2012   Pertinent  Health Maintenance Due  Topic Date Due  . FOOT EXAM  06/18/1940  . OPHTHALMOLOGY EXAM  01/28/2016  . INFLUENZA VACCINE  02/18/2016  . URINE MICROALBUMIN  07/25/2016  . HEMOGLOBIN A1C  07/31/2016  . PNA vac Low Risk Adult  Completed   Fall Risk  04/23/2015  Falls in the past year? Yes  Number falls in past yr: 1   Functional Status Survey:    Vitals:   03/31/16 1233  BP: (!) 185/88  Pulse: 64  Resp: 20  Temp: 98 F (36.7 C)  TempSrc: Oral  SpO2: 95%  Weight: 133 lb (60.3 kg)   Body mass index is 19.64 kg/m. Physical Exam  Constitutional:  Cachectic appearing male with jaundice skin tone  HENT:  Head: Normocephalic and atraumatic.  Cardiovascular: Normal rate, regular rhythm, normal heart sounds and intact distal pulses.   Pulmonary/Chest: Effort normal and breath sounds normal. No respiratory distress. He has no wheezes. He has no rales. He exhibits no tenderness.  Abdominal: Soft. Bowel sounds are normal. He exhibits distension. He exhibits no mass. There is no tenderness. There is no rebound and no guarding.  Musculoskeletal: Normal range of motion. He exhibits no edema.  Neurological: He is alert.  Answering appropriately  Skin: Skin is warm and dry. There is pallor.  Psychiatric:  Flat affect    Labs reviewed:  Recent Labs  01/28/16 0253 01/29/16 0745 03/02/16 03/29/16 1345 03/30/16 0113  NA 137 132* 140 136 139  K 4.0 4.9 4.6 4.3 4.4  CL 108 104  --  101  --   CO2 21* 15*  --  23  --   GLUCOSE 108* 571*  --  384*  --   BUN 62* 45* 48* 54* 48*  CREATININE 1.88* 2.00* 1.7* 2.02* 1.8*  CALCIUM 8.2* 8.1*  --  9.3  --     Recent Labs  06/20/15 0446 11/06/15 2358 01/27/16 1021  AST 22 29 42*  ALT 14* 18 40  ALKPHOS 39 69 85  BILITOT 0.1* 0.5 1.3*  PROT 5.4* 7.5 6.4*  ALBUMIN 2.5* 4.0 3.5    Recent Labs  11/06/15 2358  12/18/15 0510 01/27/16 1021 03/02/16 03/29/16 1345 03/30/16 0113  WBC 10.0  < > 6.8 6.8 13.1 10.8* 13.0  NEUTROABS 7.3  --   --  4.7  --  7.2  --   HGB 13.3  < > 11.8* 12.2* 12.2* 12.6* 14.0  HCT 38.2*  < > 35.2* 34.7* 35* 38.1* 42  MCV 89.7  < > 92.4 89.4  --  92.0  --   PLT 327  < > 258 316 469* 565* 709*  < > = values in this interval not displayed. Lab Results  Component Value Date   TSH 1.241 06/21/2015   Lab Results  Component Value Date   HGBA1C 11.1 (H) 01/29/2016   Lab Results  Component Value Date   CHOL 137 01/05/2015   HDL 48 01/05/2015   LDLCALC 59 01/05/2015   TRIG 148 01/05/2015   CHOLHDL 2.9 01/05/2015    Significant Diagnostic Results in last 30 days:  Ct Head Wo Contrast  Result Date: 03/28/2016 CLINICAL DATA:  Fall yesterday with bilateral neck pain. EXAM: CT HEAD WITHOUT CONTRAST CT CERVICAL SPINE WITHOUT CONTRAST TECHNIQUE: Multidetector CT imaging of the head and cervical spine was performed following the standard protocol without intravenous contrast. Multiplanar CT image reconstructions of the cervical spine were also generated. COMPARISON:  None. FINDINGS: CT HEAD FINDINGS Brain: No subdural, epidural, or subarachnoid hemorrhage. No mass, mass effect, or midline shift.  Ventricular dilatation is stable. The cerebellum, brainstem, and basal cisterns are patent. No acute cortical ischemia or infarct identified. Vascular: Vascular calcifications. Skull: No acute fractures. Sinuses/Orbits: Previous surgery over the frontal sinuses. Low-attenuation foci in the overlapping soft tissues may represent surgical packing material, unchanged since November of 2016 making air in the soft tissues not likely. The paranasal sinuses are unchanged in configuration. The mastoid air cells and middle ears are stable. Other: No other abnormalities. CT CERVICAL SPINE FINDINGS Alignment: No traumatic malalignment Skull base and vertebrae: No fractures are identified. Multilevel degenerative changes. Soft tissues and spinal canal: No acute abnormalities. Upper chest: Normal Other: No other abnormalities IMPRESSION: 1. No acute intracranial process. 2. Ventricular dilatation is essentially stable. In the appropriate clinical setting, this could be seen with normal pressure hydrocephalus. 3. No fracture or traumatic malalignment in the cervical spine. Electronically Signed   By: Gerome Sam III M.D   On: 03/28/2016 19:58   Ct Cervical Spine Wo Contrast  Result Date: 03/28/2016 CLINICAL DATA:  Fall yesterday with bilateral neck pain. EXAM: CT HEAD WITHOUT CONTRAST CT CERVICAL SPINE WITHOUT CONTRAST TECHNIQUE: Multidetector  CT imaging of the head and cervical spine was performed following the standard protocol without intravenous contrast. Multiplanar CT image reconstructions of the cervical spine were also generated. COMPARISON:  None. FINDINGS: CT HEAD FINDINGS Brain: No subdural, epidural, or subarachnoid hemorrhage. No mass, mass effect, or midline shift. Ventricular dilatation is stable. The cerebellum, brainstem, and basal cisterns are patent. No acute cortical ischemia or infarct identified. Vascular: Vascular calcifications. Skull: No acute fractures. Sinuses/Orbits: Previous surgery over the  frontal sinuses. Low-attenuation foci in the overlapping soft tissues may represent surgical packing material, unchanged since November of 2016 making air in the soft tissues not likely. The paranasal sinuses are unchanged in configuration. The mastoid air cells and middle ears are stable. Other: No other abnormalities. CT CERVICAL SPINE FINDINGS Alignment: No traumatic malalignment Skull base and vertebrae: No fractures are identified. Multilevel degenerative changes. Soft tissues and spinal canal: No acute abnormalities. Upper chest: Normal Other: No other abnormalities IMPRESSION: 1. No acute intracranial process. 2. Ventricular dilatation is essentially stable. In the appropriate clinical setting, this could be seen with normal pressure hydrocephalus. 3. No fracture or traumatic malalignment in the cervical spine. Electronically Signed   By: Gerome Sam III M.D   On: 03/28/2016 19:58    Assessment/Plan 1. Diabetic ketoacidosis without coma associated with diabetes mellitus due to underlying condition (HCC) -cont IVF until labs return -pt appears better clinically -is eating and drinking again and more alert  2. Uncontrolled type 1 diabetes mellitus with hyperglycemia (HCC) -ongoing problem -needs endocrine to see him to get this under better control once and for all or one of these spells of DKA is going to lead to his demise -cont current insulin regimen as above  3. Combined systolic and diastolic congestive heart failure, unspecified congestive heart failure chronicity (HCC) -stable at this time, no signs of acute exacerbation  4. Chronic kidney disease (CKD), stage IV (severe) (HCC) -will see if this has improved on his labs in am -cont IVFs for now and monitor output  5. Protein-calorie malnutrition, severe (HCC) -has been ongoing and multifactorial including recurrent acute illness with UTIs and DKA episodes, poor food choices (eats a lot of carbs and little protein by choice) and  generalized debility  6. Bladder outlet obstruction -foley in place, following with urology now -back on invanz due to CKD making macrodantin not a good option for him--complete course especially with uncertainty about infiltrate on cxr  Family/ staff Communication: discussed with nursing staff and NP   Labs/tests ordered: pending labs in am--cbc, bmp

## 2016-04-01 LAB — URINE CULTURE: Culture: 20000 — AB

## 2016-04-02 ENCOUNTER — Telehealth (HOSPITAL_COMMUNITY): Payer: Self-pay

## 2016-04-02 NOTE — Telephone Encounter (Signed)
Post ED Visit - Positive Culture Follow-up  Culture report reviewed by antimicrobial stewardship pharmacist:  []  Benjamin Riddle, Pharm.D. []  Benjamin Riddle, Pharm.D., BCPS []  Benjamin Riddle, Pharm.D. []  Benjamin Riddle, Pharm.D., BCPS []  Benjamin Riddle, [place] BCPS, AAHIVP []  Benjamin Riddle, Pharm.D., BCPS, AAHIVP []  Benjamin Riddle, Pharm.D. []  Benjamin Riddle, Benjamin Riddle  Benjamin Riddle, Pharm.D.  Positive urine culture, 20,000 colonies -> candida albicans Treated with Nitrofurantoin OK  Benjamin Riddle, Benjamin Riddle 04/02/2016, 5:17 PM

## 2016-04-06 ENCOUNTER — Non-Acute Institutional Stay (SKILLED_NURSING_FACILITY): Payer: Medicare Other | Admitting: Adult Health

## 2016-04-06 DIAGNOSIS — F329 Major depressive disorder, single episode, unspecified: Secondary | ICD-10-CM | POA: Diagnosis not present

## 2016-04-06 DIAGNOSIS — J189 Pneumonia, unspecified organism: Secondary | ICD-10-CM | POA: Insufficient documentation

## 2016-04-06 DIAGNOSIS — F32A Depression, unspecified: Secondary | ICD-10-CM

## 2016-04-06 DIAGNOSIS — E1065 Type 1 diabetes mellitus with hyperglycemia: Secondary | ICD-10-CM | POA: Diagnosis not present

## 2016-04-06 LAB — CBC AND DIFFERENTIAL
HEMATOCRIT: 37 % — AB (ref 41–53)
Hemoglobin: 11.7 g/dL — AB (ref 13.5–17.5)
PLATELETS: 512 10*3/uL — AB (ref 150–399)
WBC: 10.4 10*3/mL

## 2016-04-06 NOTE — Progress Notes (Signed)
Patient ID: Benjamin Riddle, male   DOB: [birth year], 82 y.o.   MRN: [id number]  Location:   [facility]   Place of Service:  SNF (31) Provider:   Peggye Leyhristy Zayah Keilman, AN[facility] [phone]   Ezequiel KayserPERINI,MARK A, MD  Patient Care Team: Rodrigo RanMark Perini, MD as PCP - General (Internal Medicine) Barron Alvineavid Grapey, MD as Consulting Physician (Urology) Hali MarryMichael Altheimer, MD as Consulting Physician (Endocrinology) Jethro BolusMark Shapiro, MD as Consulting Physician (Ophthalmology) Cammy CopaScott Gregory Dean, MD as Consulting Physician (Orthopedic Surgery) Sharrell KuJeffrey Medoff, MD as Consulting Physician (Gastroenterology) Peter M SwazilandJordan, MD as Consulting Physician (Cardiology) Merwyn Katosavid B Simonds, MD as Consulting Physician (Pulmonary Disease)  Extended Emergency Contact Information Primary Emergency Contact: Lusby,Ingrid Address: [address]          [place] [phone] [place] Home Phone: [phone] Mobile Phone: [phone] Relation: Spouse  Code Status:  Full code Goals of care: Advanced Directive information Advanced Directives 03/29/2016  Does patient have an advance directive? No  Type of Advance Directive -  Copy of advanced directive(s) in chart? -  Would patient like information on creating an advanced directive? No - patient declined information  Pre-existing out of facility DNR order (yellow form or pink MOST form) -     Chief Complaint  Patient presents with  . Acute Visit    Follow up cbgs, pnuemonia, and depression    HPI:  Pt is a 82 y.o. male seen today for a follow-up visit for uncontrolled Type I DM, HCAP, and depression.  On 9/15 he was treated for uncontrolled high blood sugar. He had IV fluids and several doses of rapid acting insulin. His blood sugar regimen checks were increased to six times daily to cover for blood sugar spikes.2:3d His Novolog dose was adjusted to cover for hyperglycemia following 10:30 and 2:30 blood sugar checks.He is taking Toujeo 15U at  bedtime. His cbg readings for the past 2 days have been as follows: fasting (168-243), lunch (138-202), dinner (62-130), hs (101-380), 10:30 ([phone]), 2:30 ( 108-167).  He has had a good appetite. Staff reports that although he is on a low carb diet, he continues to request carbs for meals. His BUN/creat has improved to 48.8/1.78 since 9/11. He reports that he was diagnosed with Type I DM at age 82. He had a CXR on 9/11 which stated that he had right basilar opacity. He has a history of rib fractures and has been using the incentive spirometer. He denies cough or shortness of breath. His WBC has decreased from 13.0 on 9/11 to 10.4 on 9/18. He was already taking Invanz 500 mg IM starting on 9/11 for UTI. He has a hx of enterobacter arogenes per urine culture on 8/7 and Invanz was repeated for coverage. He had a urine culture on 9/10 which revealed candida albicans.   He was started on Zoloft on 7/27 for depression. Staff reports that he seems more depressed. He has a flat affect and dismisses engaging in conversation.   Past Medical History:  Diagnosis Date  . Allergic rhinitis   . Allergy   . Asthma   . Cataract   . CKD (chronic kidney disease), stage III   . Combined systolic and diastolic congestive heart failure (HCC)   . COPD (chronic obstructive pulmonary disease) (HCC)   . Diabetes mellitus   . Heart murmur   . Hepatitis C    due to blood transfusion  . Hydronephrosis   . Hyperlipidemia   . Hypertension   .  Hypogonadism male   . Kidney disease    Stage III chronic kidney disease  . Neurogenic bladder   . Osteoporosis   . Recurrent urinary tract infection    With resistent Pseudomonas  . SOB (shortness of breath)    Past Surgical History:  Procedure Laterality Date  . BLADDER REPAIR     obstruction surgery  . CATARACT EXTRACTION  2015  . EYE SURGERY    . FRACTURE SURGERY    . FRONTAL SINUS OBLITERATION     sinus repaired with a plate  . HERNIA REPAIR    . PROSTATE  SURGERY    . SPINE SURGERY    . TONSILLECTOMY      Allergies  Allergen Reactions  . Aspirin Other (See Comments)    Per patient states causes him to bleed easily   . Bactrim [Sulfamethoxazole-Trimethoprim] Nausea And Vomiting  . Codeine Nausea And Vomiting  . Gentamicin Other (See Comments)    Becomes dizzy and weak  . Lexapro [Escitalopram Oxalate] Other (See Comments)    Unknown to patient  . Protonix [Pantoprazole Sodium] Diarrhea  . Ciprofloxacin Itching and Rash      Medication List       Accurate as of 04/06/16  2:05 PM. Always use your most recent med list.          acetaminophen 500 MG tablet Commonly known as:  TYLENOL Take 500 mg by mouth every 8 (eight) hours as needed for mild pain, moderate pain or headache.   albuterol (2.5 MG/3ML) 0.083% nebulizer solution Commonly known as:  PROVENTIL Take 2.5 mg by nebulization every 6 (six) hours as needed for wheezing or shortness of breath.   albuterol 90 MCG/ACT inhaler Commonly known as:  PROVENTIL,VENTOLIN Inhale 2 puffs into the lungs every 4 (four) hours as needed for wheezing or shortness of breath.   BIOFREEZE EX Apply 1 application topically 4 (four) times daily as needed.   bismuth subsalicylate 262 MG chewable tablet Commonly known as:  PEPTO BISMOL Chew 524 mg by mouth as needed for indigestion.   cephALEXin 250 MG capsule Commonly known as:  KEFLEX HOLD WHILE ON INVANZ   cholecalciferol 1000 units tablet Commonly known as:  VITAMIN D Take 1,000 Units by mouth daily.   DULERA 100-5 MCG/ACT Aero Generic drug:  mometasone-formoterol Inhale 2 puffs into the lungs 2 (two) times daily.   feeding supplement (GLUCERNA SHAKE) Liqd Take 237 mLs by mouth daily.   insulin aspart 100 UNIT/ML injection Commonly known as:  novoLOG Inject 8-14 Units into the skin 3 (three) times daily before meals. If bs is >150 inject add 1 unit every 50   insulin aspart 100 UNIT/ML injection Commonly known as:   novoLOG Inject 3-6 Units into the skin 2 (two) times daily between meals. Check blood sugar at 1030 and 1430 for cbg 200-300, give 3U. For 301-400, give 6U. If greater than 400, call provider.   INVANZ IJ 500mg  IM once daily for 14 days   methocarbamol 500 MG tablet Commonly known as:  ROBAXIN Take 500 mg by mouth every 8 (eight) hours as needed for muscle spasms.   polyethylene glycol packet Commonly known as:  MIRALAX / GLYCOLAX Take 17 g by mouth daily.   saccharomyces boulardii 250 MG capsule Commonly known as:  FLORASTOR Take 250 mg by mouth 2 (two) times daily.   sertraline 50 MG tablet Commonly known as:  ZOLOFT Take 50 mg by mouth at bedtime.   simvastatin 40 MG tablet  Commonly known as:  ZOCOR Take 40 mg by mouth daily.   TOUJEO SOLOSTAR 300 UNIT/ML Sopn Generic drug:  Insulin Glargine Inject 15 Units into the skin at bedtime.   traMADol 50 MG tablet Commonly known as:  ULTRAM Take 50 mg by mouth every morning.   traMADol 50 MG tablet Commonly known as:  ULTRAM Take 50 mg by mouth every 6 (six) hours as needed for severe pain.       Review of Systems  Constitutional: Negative for activity change, appetite change, chills, diaphoresis, fever and unexpected weight change.  HENT: Negative for congestion.   Respiratory: Negative for apnea, cough, chest tightness, shortness of breath, wheezing and stridor.   Cardiovascular: Negative for chest pain, palpitations and leg swelling.  Gastrointestinal: Negative for abdominal distention, abdominal pain, constipation and diarrhea.  Genitourinary: Negative for difficulty urinating and dysuria.  Musculoskeletal: Positive for arthralgias, gait problem and neck pain. Negative for back pain, joint swelling and myalgias.  Neurological: Negative for dizziness, seizures, syncope, facial asymmetry, speech difficulty, weakness, light-headedness, numbness and headaches.  Psychiatric/Behavioral: Negative for agitation and behavioral  problems.       Memory loss    Immunization History  Administered Date(s) Administered  . Influenza Split 04/19/2014  . Influenza-Unspecified 06/28/2015  . Pneumococcal Conjugate-13 10/26/2012, 05/24/2013, 07/20/2014  . Pneumococcal Polysaccharide-23 07/28/2011  . Td 07/21/2007, 07/28/2011  . Zoster 07/28/2011, 06/21/2012   Pertinent  Health Maintenance Due  Topic Date Due  . FOOT EXAM  06/18/1938  . OPHTHALMOLOGY EXAM  01/28/2016  . INFLUENZA VACCINE  02/18/2016  . URINE MICROALBUMIN  07/25/2016  . HEMOGLOBIN A1C  07/31/2016  . PNA vac Low Risk Adult  Completed   Fall Risk  04/23/2015  Falls in the past year? Yes  Number falls in past yr: 1   Functional Status Survey:    Vitals:   04/06/16 1322  BP: 137/88  Pulse: 77  Resp: 20  Temp: 98.2 F (36.8 C)  SpO2: 94%   There is no height or weight on file to calculate BMI. Physical Exam  Constitutional: No distress.  HENT:  Head: Normocephalic and atraumatic.  Mouth/Throat: Oropharynx is clear and moist.  Eyes: Conjunctivae are normal. Pupils are equal, round, and reactive to light.  Wears glasses  Neck: Normal range of motion. Neck supple. No JVD present.  Cardiovascular: Normal rate, regular rhythm and normal heart sounds.  Exam reveals no friction rub.   No murmur heard. Pulmonary/Chest: Breath sounds normal. He has no wheezes.  Lungs sounds diminished on R side.   Abdominal: Soft. Bowel sounds are normal. He exhibits no distension. There is no tenderness.  Genitourinary:  Genitourinary Comments: He has an indwelling catheter  Musculoskeletal: He exhibits no edema or tenderness.  Lymphadenopathy:    He has no cervical adenopathy.  Neurological: He is alert. No cranial nerve deficit.  Oriented to self, place, and situation, not time. Follows commands  Skin: Skin is warm and dry. He is not diaphoretic.  Psychiatric:  Flat affect  Nursing note and vitals reviewed.   Labs reviewed:  Recent Labs   01/28/16 0253 01/29/16 0745  03/29/16 1345 03/30/16 0113 03/31/16  NA 137 132*  < > 136 139 141  K 4.0 4.9  < > 4.3 4.4 4.5  CL 108 104  --  101  --   --   CO2 21* 15*  --  23  --   --   GLUCOSE 108* 571*  --  384*  --   --  BUN 62* 45*  < > 54* 48* 40*  CREATININE 1.88* 2.00*  < > 2.02* 1.8* 1.6*  CALCIUM 8.2* 8.1*  --  9.3  --   --   < > = values in this interval not displayed.  Recent Labs  06/20/15 0446 11/06/15 2358 01/27/16 1021  AST 22 29 42*  ALT 14* 18 40  ALKPHOS 39 69 85  BILITOT 0.1* 0.5 1.3*  PROT 5.4* 7.5 6.4*  ALBUMIN 2.5* 4.0 3.5    Recent Labs  11/06/15 2358  12/18/15 0510 01/27/16 1021  03/29/16 1345 03/30/16 0113 04/06/16  WBC 10.0  < > 6.8 6.8  < > 10.8* 13.0 10.4  NEUTROABS 7.3  --   --  4.7  --  7.2  --   --   HGB 13.3  < > 11.8* 12.2*  < > 12.6* 14.0 11.7*  HCT 38.2*  < > 35.2* 34.7*  < > 38.1* 42 37*  MCV 89.7  < > 92.4 89.4  --  92.0  --   --   PLT 327  < > 258 316  < > 565* 709* 512*  < > = values in this interval not displayed. Lab Results  Component Value Date   TSH 1.241 06/21/2015   Lab Results  Component Value Date   HGBA1C 11.1 (H) 01/29/2016   Lab Results  Component Value Date   CHOL 137 01/05/2015   HDL 48 01/05/2015   LDLCALC 59 01/05/2015   TRIG 148 01/05/2015   CHOLHDL 2.9 01/05/2015    Significant Diagnostic Results in last 30 days:  Ct Head Wo Contrast  Result Date: 03/28/2016 CLINICAL DATA:  Fall yesterday with bilateral neck pain. EXAM: CT HEAD WITHOUT CONTRAST CT CERVICAL SPINE WITHOUT CONTRAST TECHNIQUE: Multidetector CT imaging of the head and cervical spine was performed following the standard protocol without intravenous contrast. Multiplanar CT image reconstructions of the cervical spine were also generated. COMPARISON:  None. FINDINGS: CT HEAD FINDINGS Brain: No subdural, epidural, or subarachnoid hemorrhage. No mass, mass effect, or midline shift. Ventricular dilatation is stable. The cerebellum, brainstem,  and basal cisterns are patent. No acute cortical ischemia or infarct identified. Vascular: Vascular calcifications. Skull: No acute fractures. Sinuses/Orbits: Previous surgery over the frontal sinuses. Low-attenuation foci in the overlapping soft tissues may represent surgical packing material, unchanged since November of 2016 making air in the soft tissues not likely. The paranasal sinuses are unchanged in configuration. The mastoid air cells and middle ears are stable. Other: No other abnormalities. CT CERVICAL SPINE FINDINGS Alignment: No traumatic malalignment Skull base and vertebrae: No fractures are identified. Multilevel degenerative changes. Soft tissues and spinal canal: No acute abnormalities. Upper chest: Normal Other: No other abnormalities IMPRESSION: 1. No acute intracranial process. 2. Ventricular dilatation is essentially stable. In the appropriate clinical setting, this could be seen with normal pressure hydrocephalus. 3. No fracture or traumatic malalignment in the cervical spine. Electronically Signed   By: Gerome Sam III M.D   On: 03/28/2016 19:58   Ct Cervical Spine Wo Contrast  Result Date: 03/28/2016 CLINICAL DATA:  Fall yesterday with bilateral neck pain. EXAM: CT HEAD WITHOUT CONTRAST CT CERVICAL SPINE WITHOUT CONTRAST TECHNIQUE: Multidetector CT imaging of the head and cervical spine was performed following the standard protocol without intravenous contrast. Multiplanar CT image reconstructions of the cervical spine were also generated. COMPARISON:  None. FINDINGS: CT HEAD FINDINGS Brain: No subdural, epidural, or subarachnoid hemorrhage. No mass, mass effect, or midline shift. Ventricular dilatation is  stable. The cerebellum, brainstem, and basal cisterns are patent. No acute cortical ischemia or infarct identified. Vascular: Vascular calcifications. Skull: No acute fractures. Sinuses/Orbits: Previous surgery over the frontal sinuses. Low-attenuation foci in the overlapping soft  tissues may represent surgical packing material, unchanged since November of 2016 making air in the soft tissues not likely. The paranasal sinuses are unchanged in configuration. The mastoid air cells and middle ears are stable. Other: No other abnormalities. CT CERVICAL SPINE FINDINGS Alignment: No traumatic malalignment Skull base and vertebrae: No fractures are identified. Multilevel degenerative changes. Soft tissues and spinal canal: No acute abnormalities. Upper chest: Normal Other: No other abnormalities IMPRESSION: 1. No acute intracranial process. 2. Ventricular dilatation is essentially stable. In the appropriate clinical setting, this could be seen with normal pressure hydrocephalus. 3. No fracture or traumatic malalignment in the cervical spine. Electronically Signed   By: Gerome Sam III M.D   On: 03/28/2016 19:58    Assessment/Plan  1. Uncontrolled type 1 diabetes mellitus with hyperglycemia (HCC) -Improved blood sugar ranges - Continue dose of Toujeo 15 U at bedtime for basal insulin coverage.  -Continue Novolog three times daily before meals. Continue Novolog as needed after 10:30 and 2:30 cbg checks.  -Continue close monitoring of blood sugar -Nutritionist working on low carb diet -Check A1C in one month -Schedule annual opthalmology exam  2. HCAP (healthcare-associated pneumonia) -Improved -WBC decreased from 13.0 on 9/11 to 10.4 today -Continue Invanz. Will decrease duration of dose from 14 days to 7 days. Will d/c today.  -Follow CXR in one week for resolution of pneumonia  3. Depression -Possibly worsening -Will increase dose of Zoloft to 75 mg daily.   Family/ staff Communication: discussed with staff and resident  Labs/tests ordered: CBC, BMP, CXR

## 2016-04-09 ENCOUNTER — Non-Acute Institutional Stay (SKILLED_NURSING_FACILITY): Payer: Medicare Other | Admitting: Adult Health

## 2016-04-09 ENCOUNTER — Encounter: Payer: Self-pay | Admitting: Adult Health

## 2016-04-09 DIAGNOSIS — J189 Pneumonia, unspecified organism: Secondary | ICD-10-CM

## 2016-04-09 DIAGNOSIS — R531 Weakness: Secondary | ICD-10-CM | POA: Diagnosis not present

## 2016-04-09 DIAGNOSIS — M79605 Pain in left leg: Secondary | ICD-10-CM | POA: Diagnosis not present

## 2016-04-09 DIAGNOSIS — I10 Essential (primary) hypertension: Secondary | ICD-10-CM

## 2016-04-09 DIAGNOSIS — Z7189 Other specified counseling: Secondary | ICD-10-CM | POA: Diagnosis not present

## 2016-04-09 NOTE — Progress Notes (Signed)
Patient ID: Benjamin Riddle, male   DOB: 1927-08-05, 82 y.o.   MRN: [id number]  Location:  [facility]   Place of Service:  SNF (31) Provider:   Peggye Ley, ANP [facility] [phone]   Ezequiel Kayser, MD  Patient Care Team: Rodrigo Ran, MD as PCP - General (Internal Medicine) Barron Alvine, MD as Consulting Physician (Urology) Hali Marry, MD as Consulting Physician (Endocrinology) Jethro Bolus, MD as Consulting Physician (Ophthalmology) Cammy Copa, MD as Consulting Physician (Orthopedic Surgery) Sharrell Ku, MD as Consulting Physician (Gastroenterology) Peter M [place], MD as Consulting Physician (Cardiology) Merwyn Katos, MD as Consulting Physician (Pulmonary Disease)  Extended Emergency Contact Information Primary Emergency Contact: Sloane,Ingrid Address: [address]          [place] [address] [place] Home Phone: [phone] Mobile Phone: [phone] Relation: Spouse  Code Status:  Change to DNR today Goals of care: Advanced Directive information Advanced Directives 04/09/2016  Does patient have an advance directive? Yes  Type of Estate agent of [place];Living will  Copy of advanced directive(s) in chart? Yes  Would patient like information on creating an advanced directive? -  Pre-existing out of facility DNR order (yellow form or pink MOST form) -     Chief Complaint  Patient presents with  . Acute Visit    elevated bp, weakness    HPI:  Pt is a 82 y.o. male seen today for an acute visit for weakness, inability to walk, and slight increased work of breathing. Resident has COPD and uses Dulera. Nurse reports that he has proper technique. No cough, fever, or sputum production. Treated for HCAP to the RLL with Invanz one week ago. Sats 95% on RA, no cp Weaker over time, lack of motivation. Zoloft increased 4 days ago. Reports left leg pain, falls frequently.  Resident also leans to the right in his chair.   Past Medical History:  Diagnosis Date  . Allergic rhinitis   . Allergy   . Asthma   . Cataract   . CKD (chronic kidney disease), stage III   . Combined systolic and diastolic congestive heart failure (HCC)   . COPD (chronic obstructive pulmonary disease) (HCC)   . Diabetes mellitus   . Heart murmur   . Hepatitis C    due to blood transfusion  . Hydronephrosis   . Hyperlipidemia   . Hypertension   . Hypogonadism male   . Kidney disease    Stage III chronic kidney disease  . Neurogenic bladder   . Osteoporosis   . Recurrent urinary tract infection    With resistent Pseudomonas  . SOB (shortness of breath)    Past Surgical History:  Procedure Laterality Date  . BLADDER REPAIR     obstruction surgery  . CATARACT EXTRACTION  2015  . EYE SURGERY    . FRACTURE SURGERY    . FRONTAL SINUS OBLITERATION     sinus repaired with a plate  . HERNIA REPAIR    . PROSTATE SURGERY    . SPINE SURGERY    . TONSILLECTOMY      Allergies  Allergen Reactions  . Aspirin Other (See Comments)    Per patient states causes him to bleed easily   . Bactrim [Sulfamethoxazole-Trimethoprim] Nausea And Vomiting  . Codeine Nausea And Vomiting  . Gentamicin Other (See Comments)    Becomes dizzy and weak  . Lexapro [Escitalopram Oxalate] Other (See Comments)    Unknown to patient  .  Protonix [Pantoprazole Sodium] Diarrhea  . Ciprofloxacin Itching and Rash      Medication List       Accurate as of 04/09/16  2:59 PM. Always use your most recent med list.          acetaminophen 500 MG tablet Commonly known as:  TYLENOL Take 500 mg by mouth every 8 (eight) hours as needed for mild pain, moderate pain or headache.   albuterol (2.5 MG/3ML) 0.083% nebulizer solution Commonly known as:  PROVENTIL Take 2.5 mg by nebulization every 6 (six) hours as needed for wheezing or shortness of breath.   albuterol 90 MCG/ACT inhaler Commonly known as:   PROVENTIL,VENTOLIN Inhale 2 puffs into the lungs every 4 (four) hours as needed for wheezing or shortness of breath.   BIOFREEZE EX Apply 1 application topically 4 (four) times daily as needed.   bismuth subsalicylate 262 MG chewable tablet Commonly known as:  PEPTO BISMOL Chew 524 mg by mouth as needed for indigestion.   cephALEXin 250 MG capsule Commonly known as:  KEFLEX HOLD WHILE ON INVANZ   cholecalciferol 1000 units tablet Commonly known as:  VITAMIN D Take 1,000 Units by mouth daily.   DULERA 100-5 MCG/ACT Aero Generic drug:  mometasone-formoterol Inhale 2 puffs into the lungs 2 (two) times daily.   feeding supplement (GLUCERNA SHAKE) Liqd Take 237 mLs by mouth daily.   insulin aspart 100 UNIT/ML injection Commonly known as:  novoLOG Inject 8-14 Units into the skin 3 (three) times daily before meals. If bs is >150 inject add 1 unit every 50   insulin aspart 100 UNIT/ML injection Commonly known as:  novoLOG Inject 3-6 Units into the skin 2 (two) times daily between meals. Check blood sugar at 1030 and 1430 for cbg 200-300, give 3U. For 301-400, give 6U. If greater than 400, call provider.   INVANZ IJ 500mg  IM once daily for 14 days   methocarbamol 500 MG tablet Commonly known as:  ROBAXIN Take 500 mg by mouth every 8 (eight) hours as needed for muscle spasms.   polyethylene glycol packet Commonly known as:  MIRALAX / GLYCOLAX Take 17 g by mouth daily.   saccharomyces boulardii 250 MG capsule Commonly known as:  FLORASTOR Take 250 mg by mouth 2 (two) times daily.   sertraline 50 MG tablet Commonly known as:  ZOLOFT Take 50 mg by mouth at bedtime.   simvastatin 40 MG tablet Commonly known as:  ZOCOR Take 40 mg by mouth daily.   TOUJEO SOLOSTAR 300 UNIT/ML Sopn Generic drug:  Insulin Glargine Inject 15 Units into the skin at bedtime.   traMADol 50 MG tablet Commonly known as:  ULTRAM Take 50 mg by mouth every morning.   traMADol 50 MG  tablet Commonly known as:  ULTRAM Take 50 mg by mouth every 6 (six) hours as needed for severe pain.       Review of Systems  Constitutional: Positive for activity change. Negative for appetite change, chills, diaphoresis, fatigue, fever and unexpected weight change.  HENT: Negative for congestion.   Respiratory: Negative for cough, shortness of breath, wheezing and stridor.   Cardiovascular: Negative for chest pain, palpitations and leg swelling.  Gastrointestinal: Negative for abdominal distention, abdominal pain, constipation and diarrhea.  Genitourinary: Negative for difficulty urinating and dysuria.  Musculoskeletal: Positive for arthralgias and gait problem. Negative for back pain, joint swelling and myalgias.  Neurological: Positive for weakness. Negative for dizziness, seizures, syncope, facial asymmetry, speech difficulty and headaches.  Hematological: Negative for  adenopathy. Does not bruise/bleed easily.  Psychiatric/Behavioral: Negative for agitation, behavioral problems and confusion.    Immunization History  Administered Date(s) Administered  . Influenza Split 04/19/2014  . Influenza-Unspecified 06/28/2015  . Pneumococcal Conjugate-13 10/26/2012, 05/24/2013, 07/20/2014  . Pneumococcal Polysaccharide-23 07/28/2011  . Td 07/21/2007, 07/28/2011  . Zoster 07/28/2011, 06/21/2012   Pertinent  Health Maintenance Due  Topic Date Due  . FOOT EXAM  06/18/1940  . OPHTHALMOLOGY EXAM  01/28/2016  . INFLUENZA VACCINE  02/18/2016  . URINE MICROALBUMIN  07/25/2016  . HEMOGLOBIN A1C  07/31/2016  . PNA vac Low Risk Adult  Completed   Fall Risk  04/23/2015  Falls in the past year? Yes  Number falls in past yr: 1   Functional Status Survey:    Vitals:   04/09/16 1458  BP: (!) 154/84  Pulse: 84  Resp: 20  Temp: 97.8 F (36.6 C)  SpO2: 95%   There is no height or weight on file to calculate BMI. Physical Exam  Constitutional: No distress.  HENT:  Head: Normocephalic  and atraumatic.  Eyes: Conjunctivae and EOM are normal. Pupils are equal, round, and reactive to light. Right eye exhibits no discharge. Left eye exhibits no discharge.  Neck: No JVD present.  Cardiovascular: Normal rate and regular rhythm.   No murmur heard. Pulmonary/Chest: Effort normal and breath sounds normal.  Abdominal: Soft. Bowel sounds are normal. He exhibits no distension.  Musculoskeletal: He exhibits no edema or tenderness.  Mild pain with ROM of left hip. Strength BUE 4/5, RLE 4/5, LLE 3/5  Neurological: He is alert. No cranial nerve deficit. Coordination normal.  Oriented to self, place, not time. Difficulty with understanding the details of his care.  Skin: He is not diaphoretic.  Psychiatric:  Flat affect  Nursing note and vitals reviewed.   Labs reviewed:  Recent Labs  01/28/16 0253 01/29/16 0745  03/29/16 1345 03/30/16 0113 03/31/16  NA 137 132*  < > 136 139 141  K 4.0 4.9  < > 4.3 4.4 4.5  CL 108 104  --  101  --   --   CO2 21* 15*  --  23  --   --   GLUCOSE 108* 571*  --  384*  --   --   BUN 62* 45*  < > 54* 48* 40*  CREATININE 1.88* 2.00*  < > 2.02* 1.8* 1.6*  CALCIUM 8.2* 8.1*  --  9.3  --   --   < > = values in this interval not displayed.  Recent Labs  06/20/15 0446 11/06/15 2358 01/27/16 1021  AST 22 29 42*  ALT 14* 18 40  ALKPHOS 39 69 85  BILITOT 0.1* 0.5 1.3*  PROT 5.4* 7.5 6.4*  ALBUMIN 2.5* 4.0 3.5    Recent Labs  11/06/15 2358  12/18/15 0510 01/27/16 1021  03/29/16 1345 03/30/16 0113 04/06/16  WBC 10.0  < > 6.8 6.8  < > 10.8* 13.0 10.4  NEUTROABS 7.3  --   --  4.7  --  7.2  --   --   HGB 13.3  < > 11.8* 12.2*  < > 12.6* 14.0 11.7*  HCT 38.2*  < > 35.2* 34.7*  < > 38.1* 42 37*  MCV 89.7  < > 92.4 89.4  --  92.0  --   --   PLT 327  < > 258 316  < > 565* 709* 512*  < > = values in this interval not displayed. Lab Results  Component  Value Date   TSH 1.241 06/21/2015   Lab Results  Component Value Date   HGBA1C 11.1 (H)  01/29/2016   Lab Results  Component Value Date   CHOL 137 01/05/2015   HDL 48 01/05/2015   LDLCALC 59 01/05/2015   TRIG 148 01/05/2015   CHOLHDL 2.9 01/05/2015    Significant Diagnostic Results in last 30 days:  Ct Head Wo Contrast  Result Date: 03/28/2016 CLINICAL DATA:  Fall yesterday with bilateral neck pain. EXAM: CT HEAD WITHOUT CONTRAST CT CERVICAL SPINE WITHOUT CONTRAST TECHNIQUE: Multidetector CT imaging of the head and cervical spine was performed following the standard protocol without intravenous contrast. Multiplanar CT image reconstructions of the cervical spine were also generated. COMPARISON:  None. FINDINGS: CT HEAD FINDINGS Brain: No subdural, epidural, or subarachnoid hemorrhage. No mass, mass effect, or midline shift. Ventricular dilatation is stable. The cerebellum, brainstem, and basal cisterns are patent. No acute cortical ischemia or infarct identified. Vascular: Vascular calcifications. Skull: No acute fractures. Sinuses/Orbits: Previous surgery over the frontal sinuses. Low-attenuation foci in the overlapping soft tissues may represent surgical packing material, unchanged since November of 2016 making air in the soft tissues not likely. The paranasal sinuses are unchanged in configuration. The mastoid air cells and middle ears are stable. Other: No other abnormalities. CT CERVICAL SPINE FINDINGS Alignment: No traumatic malalignment Skull base and vertebrae: No fractures are identified. Multilevel degenerative changes. Soft tissues and spinal canal: No acute abnormalities. Upper chest: Normal Other: No other abnormalities IMPRESSION: 1. No acute intracranial process. 2. Ventricular dilatation is essentially stable. In the appropriate clinical setting, this could be seen with normal pressure hydrocephalus. 3. No fracture or traumatic malalignment in the cervical spine. Electronically Signed   By: Gerome Sam III M.D   On: 03/28/2016 19:58   Ct Cervical Spine Wo  Contrast  Result Date: 03/28/2016 CLINICAL DATA:  Fall yesterday with bilateral neck pain. EXAM: CT HEAD WITHOUT CONTRAST CT CERVICAL SPINE WITHOUT CONTRAST TECHNIQUE: Multidetector CT imaging of the head and cervical spine was performed following the standard protocol without intravenous contrast. Multiplanar CT image reconstructions of the cervical spine were also generated. COMPARISON:  None. FINDINGS: CT HEAD FINDINGS Brain: No subdural, epidural, or subarachnoid hemorrhage. No mass, mass effect, or midline shift. Ventricular dilatation is stable. The cerebellum, brainstem, and basal cisterns are patent. No acute cortical ischemia or infarct identified. Vascular: Vascular calcifications. Skull: No acute fractures. Sinuses/Orbits: Previous surgery over the frontal sinuses. Low-attenuation foci in the overlapping soft tissues may represent surgical packing material, unchanged since November of 2016 making air in the soft tissues not likely. The paranasal sinuses are unchanged in configuration. The mastoid air cells and middle ears are stable. Other: No other abnormalities. CT CERVICAL SPINE FINDINGS Alignment: No traumatic malalignment Skull base and vertebrae: No fractures are identified. Multilevel degenerative changes. Soft tissues and spinal canal: No acute abnormalities. Upper chest: Normal Other: No other abnormalities IMPRESSION: 1. No acute intracranial process. 2. Ventricular dilatation is essentially stable. In the appropriate clinical setting, this could be seen with normal pressure hydrocephalus. 3. No fracture or traumatic malalignment in the cervical spine. Electronically Signed   By: Gerome Sam III M.D   On: 03/28/2016 19:58    Assessment/Plan 1. Weakness General over decline, much more so over the past week PT to eval and tx Discontinue scheduled ultram Decrease Zoloft to 50 mg qhs No signs of CVA on exam.  Left leg weakness, see below  2. Essential hypertension Norvasc 5 mg  qhs  3. HCAP (healthcare-associated pneumonia) Dyspnea resolved CXR 2 view today  4. Left leg pain Xray left hip to rule out fracture due to frequent falls  5. Advanced care planning Dr. Claris Gower was changed to a DNR today. His overall quality of life is poor. His wife verbalized that she does not want aggressive care and would like to avoid hospitalizations when possible.   Family/ staff Communication: discussed with staff and Ms. Paragas  Peggye Ley, ANP [facility] [phone]

## 2016-04-10 ENCOUNTER — Ambulatory Visit (HOSPITAL_COMMUNITY)
Admission: RE | Admit: 2016-04-10 | Discharge: 2016-04-10 | Disposition: A | Discharge status: Home / Self Care | Payer: Medicare Other | Source: Ambulatory Visit | Attending: Adult Health | Admitting: Adult Health

## 2016-04-10 ENCOUNTER — Other Ambulatory Visit: Payer: Self-pay | Admitting: Adult Health

## 2016-04-10 ENCOUNTER — Ambulatory Visit (HOSPITAL_COMMUNITY)
Admission: RE | Admit: 2016-04-10 | Discharge: 2016-04-10 | Disposition: A | Discharge status: Home / Self Care | Payer: Medicare Other | Source: Ambulatory Visit | Attending: Radiology | Admitting: Radiology

## 2016-04-10 DIAGNOSIS — J90 Pleural effusion, not elsewhere classified: Secondary | ICD-10-CM

## 2016-04-10 DIAGNOSIS — Z9889 Other specified postprocedural states: Secondary | ICD-10-CM | POA: Insufficient documentation

## 2016-04-10 LAB — BODY FLUID CELL COUNT WITH DIFFERENTIAL
Eos, Fluid: 22 %
Lymphs, Fluid: 41 %
Monocyte-Macrophage-Serous Fluid: 28 % — ABNORMAL LOW (ref 50–90)
NEUTROPHIL FLUID: 9 % (ref 0–25)
WBC FLUID: 614 uL (ref 0–1000)

## 2016-04-10 LAB — PROTEIN, BODY FLUID: Total protein, fluid: 5 g/dL

## 2016-04-10 LAB — GLUCOSE, SEROUS FLUID: Glucose, Fluid: 180 mg/dL

## 2016-04-10 LAB — LACTATE DEHYDROGENASE, PLEURAL OR PERITONEAL FLUID: LD, Fluid: 120 U/L — ABNORMAL HIGH (ref 3–23)

## 2016-04-10 NOTE — Procedures (Signed)
Ultrasound-guided diagnostic and therapeutic right thoracentesis performed yielding 1.4 liters of slightly hazy, amber colored fluid. No immediate complications. Follow-up chest x-ray pending. The fluid was sent to the lab for preordered studies.

## 2016-04-13 LAB — CBC AND DIFFERENTIAL
HCT: 34 % — AB (ref 41–53)
HEMOGLOBIN: 11.7 g/dL — AB (ref 13.5–17.5)
PLATELETS: 554 10*3/uL — AB (ref 150–399)
WBC: 13.7 10*3/mL

## 2016-04-13 LAB — HEMOGLOBIN A1C
HEMOGLOBIN A1C: 9.2
Hemoglobin A1C: 9.2

## 2016-04-13 LAB — BASIC METABOLIC PANEL
BUN: 58 mg/dL — AB (ref 4–21)
Creatinine: 2 mg/dL — AB (ref 0.6–1.3)
GLUCOSE: 210 mg/dL
Potassium: 4.2 mmol/L (ref 3.4–5.3)
Sodium: 136 mmol/L — AB (ref 137–147)

## 2016-04-21 ENCOUNTER — Ambulatory Visit (INDEPENDENT_AMBULATORY_CARE_PROVIDER_SITE_OTHER): Payer: Medicare Other | Admitting: Endocrinology

## 2016-04-21 ENCOUNTER — Encounter: Payer: Self-pay | Admitting: Endocrinology

## 2016-04-21 VITALS — BP 114/64 | HR 83 | Ht 68.0 in | Wt 131.0 lb

## 2016-04-21 DIAGNOSIS — E1010 Type 1 diabetes mellitus with ketoacidosis without coma: Secondary | ICD-10-CM

## 2016-04-21 LAB — POCT GLYCOSYLATED HEMOGLOBIN (HGB A1C): HEMOGLOBIN A1C: 6.5

## 2016-04-21 MED ORDER — INSULIN ASPART 100 UNIT/ML ~~LOC~~ SOLN: 9.0000 [IU] | Freq: Three times a day (TID) | SUBCUTANEOUS | 0 refills | Status: DC

## 2016-04-21 NOTE — Progress Notes (Signed)
Subjective:    Patient ID: Benjamin Riddle, male    DOB: [birth year], 82 y.o.   MRN: [id number]  HPI Hx s obtained from wife, due to pt's poor memory.  She says pt's DM was dx'ed in 1987; he has mild if any neuropathy of the lower extremities; he has associated renal failure and neurogenic bladder; he has been on insulin since dx; pt says his diet is good (provided by [facility]), but exercise is limited by health problems; he has never had pancreatitis.  He [facility].  He has had severe hypoglycemia many times (last time was in approx 2015).  He has also had several episodes of DKA (most recently in mid-2017).   Past Medical History:  Diagnosis Date  . Allergic rhinitis   . Allergy   . Asthma   . Cataract   . CKD (chronic kidney disease), stage III   . Combined systolic and diastolic congestive heart failure (HCC)   . COPD (chronic obstructive pulmonary disease) (HCC)   . Diabetes mellitus   . Heart murmur   . Hepatitis C    due to blood transfusion  . Hydronephrosis   . Hyperlipidemia   . Hypertension   . Hypogonadism male   . Kidney disease    Stage III chronic kidney disease  . Neurogenic bladder   . Osteoporosis   . Recurrent urinary tract infection    With resistent Pseudomonas  . SOB (shortness of breath)     Past Surgical History:  Procedure Laterality Date  . BLADDER REPAIR     obstruction surgery  . CATARACT EXTRACTION  2015  . EYE SURGERY    . FRACTURE SURGERY    . FRONTAL SINUS OBLITERATION     sinus repaired with a plate  . HERNIA REPAIR    . PROSTATE SURGERY    . SPINE SURGERY    . TONSILLECTOMY      Social History   Social History  . Marital status: Married    Spouse name: Jerene Dilling  . Number of children: 0  . Years of education: MD   Occupational History  . Retired Other   Social History Main Topics  . Smoking status: Former Smoker    Types: Cigars, Cigarettes    Quit date: 07/21/2011  . Smokeless tobacco: Never  Used     Comment: smoked occ cigar and pipe  . Alcohol use No  . Drug use: No  . Sexual activity: Not on file   Other Topics Concern  . Not on file   Social History Narrative   Patient lives at home with spouse.   Caffeine Use: 3 cups daily    Current Outpatient Prescriptions on File Prior to Visit  Medication Sig Dispense Refill  . bismuth subsalicylate (PEPTO BISMOL) 262 MG chewable tablet Chew 524 mg by mouth as needed for indigestion.     . cephALEXin (KEFLEX) 250 MG capsule HOLD WHILE ON INVANZ    . cholecalciferol (VITAMIN D) 1000 UNITS tablet Take 1,000 Units by mouth daily.    . Ertapenem Sodium (INVANZ IJ) 500mg  IM once daily for 14 days    . feeding supplement, GLUCERNA SHAKE, (GLUCERNA SHAKE) LIQD Take 237 mLs by mouth daily.    . Insulin Glargine (TOUJEO SOLOSTAR) 300 UNIT/ML SOPN Inject 15 Units into the skin at bedtime.    . Menthol, Topical Analgesic, (BIOFREEZE EX) Apply 1 application topically 4 (four) times daily as needed.    . methocarbamol (ROBAXIN) 500 MG  tablet Take 500 mg by mouth every 8 (eight) hours as needed for muscle spasms.     . mometasone-formoterol (DULERA) 100-5 MCG/ACT AERO Inhale 2 puffs into the lungs 2 (two) times daily.    Marland Kitchen saccharomyces boulardii (FLORASTOR) 250 MG capsule Take 250 mg by mouth 2 (two) times daily.     . sertraline (ZOLOFT) 50 MG tablet Take 50 mg by mouth at bedtime.     . simvastatin (ZOCOR) 40 MG tablet Take 40 mg by mouth daily.      . traMADol (ULTRAM) 50 MG tablet Take 50 mg by mouth every 6 (six) hours as needed for severe pain.     No current facility-administered medications on file prior to visit.     Allergies  Allergen Reactions  . Aspirin Other (See Comments)    Per patient states causes him to bleed easily   . Bactrim [Sulfamethoxazole-Trimethoprim] Nausea And Vomiting  . Codeine Nausea And Vomiting  . Gentamicin Other (See Comments)    Becomes dizzy and weak  . Lexapro [Escitalopram Oxalate] Other (See  Comments)    Unknown to patient  . Protonix [Pantoprazole Sodium] Diarrhea  . Ciprofloxacin Itching and Rash    Family History  Problem Relation Age of Onset  . Heart failure Mother   . Hypertension Mother   . COPD Father     smoked  . Diabetes Father   . Cancer Sister     Breast  . Diabetes Sister   . COPD Brother     smoked    BP 114/64   Pulse 83   Ht 5\' 8"  (1.727 m)   Wt 131 lb (59.4 kg)   SpO2 95%   BMI 19.92 kg/m     Review of Systems denies blurry vision, headache, chest pain, n/v, muscle cramps, excessive diaphoresis, cold intolerance, and rhinorrhea.  No recent weight change. He has chronic doe, easy bruising, and memory loss.      Objective:   Physical Exam VS: see vs page GEN: no distress.  In wheelchair HEAD: head: no deformity eyes: no periorbital swelling, no proptosis external nose and ears are normal mouth: no lesion seen NECK: supple, thyroid is not enlarged CHEST WALL: no deformity LUNGS: clear to auscultation, except decreased at the right base.  CV: reg rate and rhythm, no murmur GEN: foley catheter.   ABD: abdomen is soft, nontender.  no hepatosplenomegaly.  not distended.  no hernia MUSCULOSKELETAL: muscle bulk and strength are grossly normal.  no obvious joint swelling.  gait is normal and steady EXTEMITIES: no deformity.  no ulcer on the feet.  feet are of normal color and temp.  no edema.  There is bilateral onychomycosis of the toenails.  PULSES: dorsalis pedis intact bilat.  no carotid bruit NEURO:  cn 2-12 grossly intact.   readily moves all 4's.  sensation is intact to touch on the feet SKIN:  Normal texture and temperature.  No rash or suspicious lesion is visible.  Few ecchymoses of the forearms.  Feet are cyanotic when held dependent, and are cool to touch.   NODES:  None palpable at the neck PSYCH: alert, oriented to self and place only.  Does not appear anxious nor depressed.    I have reviewed outside records, and  summarized: Our staff has called [facility], and obtained cbg record.  There are only 10 cbg's.  They vary from 51-456.  The time of day or circumstances of the 51 are not given  Lab Results  Component Value Date   HGBA1C 6.5 04/21/2016   i personally reviewed electrocardiogram tracing (03/29/16): Indication: severe hyperglycemia.  Impression: old ASMI     Assessment & Plan:  Type 1 DM: overcontrolled.  Severe hypoglycemia.  In this setting, he is not a candidate for aggressive glycemic control.  Dementia: this further complicates the rx of DM.  Patient is advised the following: Patient Instructions  Please continue the same toujeo (15 units QHS), and: Take 9 units of novolog 3 times a day (just before each meal) Also, take 1 extra unit of novolog for any cbg over 300. check your blood sugar 4 times a day.  before the 3 meals, and at bedtime.  also check if you have symptoms of your blood sugar being too high or too low.  please keep a record of the readings and bring it to your next appointment here (or you can bring the meter itself).  You can write it on any piece of paper.  please call us sooner if your blood sugar goes below 70, or if you have a lot of readings over 200. Please fax us cbg record in 2-3 days, and send with resident to each ov here.  Please come back for a follow-up appointment in 6 weeks.

## 2016-04-21 NOTE — Patient Instructions (Signed)
Please continue the same toujeo (15 units QHS), and: Take 9 units of novolog 3 times a day (just before each meal) Also, take 1 extra unit of novolog for any cbg over 300. check your blood sugar 4 times a day.  before the 3 meals, and at bedtime.  also check if you have symptoms of your blood sugar being too high or too low.  please keep a record of the readings and bring it to your next appointment here (or you can bring the meter itself).  You can write it on any piece of paper.  please call us sooner if your blood sugar goes below 70, or if you have a lot of readings over 200. Please fax us cbg record in 2-3 days, and send with resident to each ov here.  Please come back for a follow-up appointment in 6 weeks.

## 2016-05-01 ENCOUNTER — Non-Acute Institutional Stay (SKILLED_NURSING_FACILITY): Payer: Medicare Other | Admitting: Adult Health

## 2016-05-01 ENCOUNTER — Encounter: Payer: Self-pay | Admitting: Adult Health

## 2016-05-01 DIAGNOSIS — E782 Mixed hyperlipidemia: Secondary | ICD-10-CM

## 2016-05-01 DIAGNOSIS — J449 Chronic obstructive pulmonary disease, unspecified: Secondary | ICD-10-CM | POA: Diagnosis not present

## 2016-05-01 DIAGNOSIS — J90 Pleural effusion, not elsewhere classified: Secondary | ICD-10-CM

## 2016-05-01 DIAGNOSIS — E1065 Type 1 diabetes mellitus with hyperglycemia: Secondary | ICD-10-CM | POA: Diagnosis not present

## 2016-05-01 DIAGNOSIS — N184 Chronic kidney disease, stage 4 (severe): Secondary | ICD-10-CM | POA: Diagnosis not present

## 2016-05-01 DIAGNOSIS — I10 Essential (primary) hypertension: Secondary | ICD-10-CM

## 2016-05-01 DIAGNOSIS — R413 Other amnesia: Secondary | ICD-10-CM

## 2016-05-01 NOTE — Progress Notes (Addendum)
Patient ID: Benjamin Riddle, male   DOB: Apr 04, 1928, 82 y.o.   MRN: [id number]     Nursing Home Location:    [facility], skilled 2   Code Status: DNR  Patient Care Team: Rodrigo Ran, MD as PCP - General (Internal Medicine) Barron Alvine, MD as Consulting Physician (Urology) Hali Marry, MD as Consulting Physician (Endocrinology) Jethro Bolus, MD as Consulting Physician (Ophthalmology) Cammy Copa, MD as Consulting Physician (Orthopedic Surgery) Sharrell Ku, MD as Consulting Physician (Gastroenterology) Peter M [place], MD as Consulting Physician (Cardiology) Merwyn Katos, MD as Consulting Physician (Pulmonary Disease)  Goals of care: Advanced Directive information Advanced Directives 05/01/2016  Does patient have an advance directive? Yes  Type of Estate agent of [place];Living will;Out of facility DNR (pink MOST or yellow form)  Copy of advanced directive(s) in chart? Yes  Would patient like information on creating an advanced directive? -  Pre-existing out of facility DNR order (yellow form or pink MOST form) -     Place of Service: SNF (31)  Chief Complaint  Patient presents with  . Medical Management of Chronic Issues    HPI:  82 y.o. male residing at [facility]. I am here to review his chronic medical issues.  VS have been stable over the past month. Weight has remained stable at 131 lbs.   9/22 Right pleural effusion, drained by IR 1400cc.  Exudative from previous pna. CXR 10/2 showed no acute pathology, resolved pleural effusion No sob, cp, fever, malaise, feels much better  DM Type 1: CBGs improved BF 150-243, 1030  236-272, Lunch 112-235, 230 126-406, supper 83-168 A1C 6.5 on 04/21/16. We were having issues with low cbgs in the am and then he would not receive coverage, then eat and cbgs would sky rocket to 400. We are now covering his CBG after breakfast and this has helped. He has recently been evaluated by  DR. Everardo All with endocrine with no new orders.   Bp was elevated in Sept and he was started on Norvasc 5 mg qd.  Improved for todays visit  He has no complaints and seems to be doing better and has required less monitoring. He did attempt to get up without help this week and sustained a laceration to his scalp which closed on its own. No neuro changes.   COPD Gold III (FEV1 42%): denies cough or sputum production. Not sob as he is mostly wheelchair bound.  Can walk short distances with assist. Uses Dulera and needs coaching with technique.  Memory loss MMSE 24/30 failed clock 02/04/16, not currently on medication for memory  Review of Systems:  Review of Systems  Constitutional: Negative for activity change, appetite change, chills, diaphoresis, fatigue, fever and unexpected weight change.  Respiratory: Negative for cough, shortness of breath, wheezing and stridor.   Cardiovascular: Negative for chest pain, palpitations and leg swelling.  Gastrointestinal: Negative for abdominal distention, abdominal pain, constipation and diarrhea.  Genitourinary: Negative for difficulty urinating and dysuria.  Musculoskeletal: Positive for arthralgias and gait problem. Negative for back pain, joint swelling and myalgias.  Neurological: Negative for dizziness, seizures, syncope, facial asymmetry, speech difficulty, weakness and headaches.  Hematological: Negative for adenopathy. Does not bruise/bleed easily.  Psychiatric/Behavioral: Positive for confusion. Negative for agitation and behavioral problems.    Medications: Patient's Medications  New Prescriptions   No medications on file  Previous Medications   AMLODIPINE (NORVASC) 5 MG TABLET    Take 5 mg by mouth daily.   BISMUTH SUBSALICYLATE (PEPTO  BISMOL) 262 MG CHEWABLE TABLET    Chew 524 mg by mouth as needed for indigestion.    CEPHALEXIN (KEFLEX) 250 MG CAPSULE    HOLD WHILE ON INVANZ   CHOLECALCIFEROL (VITAMIN D) 1000 UNITS TABLET    Take 1,000  Units by mouth daily.   ERTAPENEM SODIUM (INVANZ IJ)    500mg  IM once daily for 14 days   FEEDING SUPPLEMENT, GLUCERNA SHAKE, (GLUCERNA SHAKE) LIQD    Take 237 mLs by mouth daily as needed.    INSULIN ASPART (NOVOLOG) 100 UNIT/ML INJECTION    Inject 5-10 Units into the skin 3 (three) times daily before meals. 5 units at lunch, 10 units at breakfast and supper. Give 1 units for every 1 unit over 150.   INSULIN GLARGINE (TOUJEO SOLOSTAR) 300 UNIT/ML SOPN    Inject 15 Units into the skin at bedtime.   MENTHOL, TOPICAL ANALGESIC, (BIOFREEZE EX)    Apply 1 application topically 4 (four) times daily as needed.   METHOCARBAMOL (ROBAXIN) 500 MG TABLET    Take 500 mg by mouth every 8 (eight) hours as needed for muscle spasms.    MOMETASONE-FORMOTEROL (DULERA) 100-5 MCG/ACT AERO    Inhale 2 puffs into the lungs 2 (two) times daily.   POLYETHYLENE GLYCOL (MIRALAX / GLYCOLAX) PACKET    Take 17 g by mouth daily.   SACCHAROMYCES BOULARDII (FLORASTOR) 250 MG CAPSULE    Take 250 mg by mouth 2 (two) times daily.    SERTRALINE (ZOLOFT) 50 MG TABLET    Take 50 mg by mouth at bedtime.    SIMVASTATIN (ZOCOR) 40 MG TABLET    Take 40 mg by mouth daily.     TRAMADOL (ULTRAM) 50 MG TABLET    Take 50 mg by mouth every 6 (six) hours as needed for severe pain.  Modified Medications   No medications on file  Discontinued Medications   INSULIN ASPART (NOVOLOG) 100 UNIT/ML INJECTION    Inject 9 Units into the skin 3 (three) times daily with meals.     Physical Exam:  Vitals:   05/01/16 0924  BP: 137/79  Pulse: 67  Resp: 19  Temp: 97.4 F (36.3 C)  SpO2: 98%    Physical Exam  Constitutional: No distress.  HENT:  Head: Normocephalic.  Small hematoma to posterior scalp  Cardiovascular: Normal rate and regular rhythm.   No murmur heard. Pulmonary/Chest: Effort normal. He has wheezes (exp).  Abdominal: Soft. Bowel sounds are normal.  Musculoskeletal: He exhibits no edema or tenderness.  Neurological: He is  alert.  Skin: Skin is warm and dry. He is not diaphoretic.  Psychiatric:  flat    Wt Readings from Last 3 Encounters:  04/21/16 131 lb (59.4 kg)  03/31/16 133 lb (60.3 kg)  03/29/16 132 lb (59.9 kg)     Labs reviewed/Significant Diagnostic Results:  Basic Metabolic Panel:  Recent Labs  96/10/5405/11/17 0253 01/29/16 0745  03/29/16 1345 03/30/16 0113 03/31/16 04/13/16  NA 137 132*  < > 136 139 141 136*  K 4.0 4.9  < > 4.3 4.4 4.5 4.2  CL 108 104  --  101  --   --   --   CO2 21* 15*  --  23  --   --   --   GLUCOSE 108* 571*  --  384*  --   --   --   BUN 62* 45*  < > 54* 48* 40* 58*  CREATININE 1.88* 2.00*  < > 2.02* 1.8* 1.6*  2.0*  CALCIUM 8.2* 8.1*  --  9.3  --   --   --   < > = values in this interval not displayed. Liver Function Tests:  Recent Labs  06/20/15 0446 11/06/15 2358 01/27/16 1021  AST 22 29 42*  ALT 14* 18 40  ALKPHOS 39 69 85  BILITOT 0.1* 0.5 1.3*  PROT 5.4* 7.5 6.4*  ALBUMIN 2.5* 4.0 3.5   No results for input(s): LIPASE, AMYLASE in the last 8760 hours. No results for input(s): AMMONIA in the last 8760 hours. CBC:  Recent Labs  11/06/15 2358  12/18/15 0510 01/27/16 1021  03/29/16 1345 03/30/16 0113 04/06/16 04/13/16  WBC 10.0  < > 6.8 6.8  < > 10.8* 13.0 10.4 13.7  NEUTROABS 7.3  --   --  4.7  --  7.2  --   --   --   HGB 13.3  < > 11.8* 12.2*  < > 12.6* 14.0 11.7* 11.7*  HCT 38.2*  < > 35.2* 34.7*  < > 38.1* 42 37* 34*  MCV 89.7  < > 92.4 89.4  --  92.0  --   --   --   PLT 327  < > 258 316  < > 565* 709* 512* 554*  < > = values in this interval not displayed. CBG:  Recent Labs  03/29/16 1332 03/29/16 1515 03/29/16 1551  GLUCAP 378* 181* 154*   TSH:  Recent Labs  06/21/15 0945  TSH 1.241   A1C: Lab Results  Component Value Date   HGBA1C 6.5 04/21/2016   Lipid Panel: No results for input(s): CHOL, HDL, LDLCALC, TRIG, CHOLHDL, LDLDIRECT in the last 8760 hours.     Assessment/Plan   1. Pleural effusion resolved  2.  Uncontrolled type 1 diabetes mellitus with hyperglycemia (HCC) Continue current regimen A1C pending. Needs bedtime snack to prevent lows in the am Recommended diabetic eye exam  3. Essential hypertension Improved Continue Norvasc 5 mg qd  4. COPD GOLD III  Stable Continue Dulera, if technique worsens would change to neb  5. Memory loss Consider adding aricept at next visit  6. Chronic kidney disease (CKD), stage IV (severe) (HCC) Stable, continue to monitor Urine microalbumin check  7. HLD Check lipid panel, on zocor  Labs: lipid panel, urine micro   Peggye Ley, ANP [facility] [phone]

## 2016-05-05 ENCOUNTER — Inpatient Hospital Stay (HOSPITAL_COMMUNITY)
Admission: EM | Admit: 2016-05-05 | Discharge: 2016-05-07 | DRG: 638 | Disposition: A | Discharge status: Skilled Nursing Facility | Payer: Medicare Other | Attending: Family Medicine | Admitting: Family Medicine

## 2016-05-05 ENCOUNTER — Emergency Department (HOSPITAL_COMMUNITY): Payer: Medicare Other

## 2016-05-05 ENCOUNTER — Encounter (HOSPITAL_COMMUNITY): Payer: Self-pay | Admitting: Emergency Medicine

## 2016-05-05 DIAGNOSIS — E875 Hyperkalemia: Secondary | ICD-10-CM | POA: Diagnosis present

## 2016-05-05 DIAGNOSIS — E1010 Type 1 diabetes mellitus with ketoacidosis without coma: Secondary | ICD-10-CM | POA: Diagnosis present

## 2016-05-05 DIAGNOSIS — E1022 Type 1 diabetes mellitus with diabetic chronic kidney disease: Secondary | ICD-10-CM | POA: Diagnosis present

## 2016-05-05 DIAGNOSIS — E1065 Type 1 diabetes mellitus with hyperglycemia: Secondary | ICD-10-CM | POA: Diagnosis present

## 2016-05-05 DIAGNOSIS — Z993 Dependence on wheelchair: Secondary | ICD-10-CM | POA: Diagnosis not present

## 2016-05-05 DIAGNOSIS — Z825 Family history of asthma and other chronic lower respiratory diseases: Secondary | ICD-10-CM | POA: Diagnosis not present

## 2016-05-05 DIAGNOSIS — Z794 Long term (current) use of insulin: Secondary | ICD-10-CM | POA: Diagnosis not present

## 2016-05-05 DIAGNOSIS — I5042 Chronic combined systolic (congestive) and diastolic (congestive) heart failure: Secondary | ICD-10-CM | POA: Diagnosis present

## 2016-05-05 DIAGNOSIS — Z833 Family history of diabetes mellitus: Secondary | ICD-10-CM

## 2016-05-05 DIAGNOSIS — R339 Retention of urine, unspecified: Secondary | ICD-10-CM | POA: Diagnosis present

## 2016-05-05 DIAGNOSIS — I130 Hypertensive heart and chronic kidney disease with heart failure and stage 1 through stage 4 chronic kidney disease, or unspecified chronic kidney disease: Secondary | ICD-10-CM | POA: Diagnosis present

## 2016-05-05 DIAGNOSIS — Z8249 Family history of ischemic heart disease and other diseases of the circulatory system: Secondary | ICD-10-CM

## 2016-05-05 DIAGNOSIS — N179 Acute kidney failure, unspecified: Secondary | ICD-10-CM

## 2016-05-05 DIAGNOSIS — N320 Bladder-neck obstruction: Secondary | ICD-10-CM

## 2016-05-05 DIAGNOSIS — Z87891 Personal history of nicotine dependence: Secondary | ICD-10-CM

## 2016-05-05 DIAGNOSIS — N184 Chronic kidney disease, stage 4 (severe): Secondary | ICD-10-CM | POA: Diagnosis present

## 2016-05-05 DIAGNOSIS — E0810 Diabetes mellitus due to underlying condition with ketoacidosis without coma: Secondary | ICD-10-CM

## 2016-05-05 DIAGNOSIS — E785 Hyperlipidemia, unspecified: Secondary | ICD-10-CM | POA: Diagnosis present

## 2016-05-05 DIAGNOSIS — Z885 Allergy status to narcotic agent status: Secondary | ICD-10-CM

## 2016-05-05 DIAGNOSIS — I5040 Unspecified combined systolic (congestive) and diastolic (congestive) heart failure: Secondary | ICD-10-CM | POA: Diagnosis present

## 2016-05-05 DIAGNOSIS — Z886 Allergy status to analgesic agent status: Secondary | ICD-10-CM | POA: Diagnosis not present

## 2016-05-05 DIAGNOSIS — Z883 Allergy status to other anti-infective agents status: Secondary | ICD-10-CM

## 2016-05-05 DIAGNOSIS — N318 Other neuromuscular dysfunction of bladder: Secondary | ICD-10-CM | POA: Diagnosis present

## 2016-05-05 DIAGNOSIS — M25552 Pain in left hip: Secondary | ICD-10-CM | POA: Diagnosis present

## 2016-05-05 DIAGNOSIS — R413 Other amnesia: Secondary | ICD-10-CM | POA: Diagnosis present

## 2016-05-05 DIAGNOSIS — J449 Chronic obstructive pulmonary disease, unspecified: Secondary | ICD-10-CM

## 2016-05-05 DIAGNOSIS — Z79899 Other long term (current) drug therapy: Secondary | ICD-10-CM

## 2016-05-05 DIAGNOSIS — Z888 Allergy status to other drugs, medicaments and biological substances status: Secondary | ICD-10-CM

## 2016-05-05 DIAGNOSIS — R739 Hyperglycemia, unspecified: Secondary | ICD-10-CM

## 2016-05-05 DIAGNOSIS — R627 Adult failure to thrive: Secondary | ICD-10-CM | POA: Diagnosis present

## 2016-05-05 DIAGNOSIS — F329 Major depressive disorder, single episode, unspecified: Secondary | ICD-10-CM | POA: Diagnosis present

## 2016-05-05 DIAGNOSIS — F32A Depression, unspecified: Secondary | ICD-10-CM | POA: Diagnosis present

## 2016-05-05 DIAGNOSIS — E1110 Type 2 diabetes mellitus with ketoacidosis without coma: Secondary | ICD-10-CM | POA: Diagnosis present

## 2016-05-05 DIAGNOSIS — IMO0002 Reserved for concepts with insufficient information to code with codable children: Secondary | ICD-10-CM | POA: Diagnosis present

## 2016-05-05 LAB — GLUCOSE, CAPILLARY
GLUCOSE-CAPILLARY: 249 mg/dL — AB (ref 65–99)
Glucose-Capillary: 167 mg/dL — ABNORMAL HIGH (ref 65–99)
Glucose-Capillary: 128 mg/dL — ABNORMAL HIGH (ref 65–99)
Glucose-Capillary: 116 mg/dL — ABNORMAL HIGH (ref 65–99)

## 2016-05-05 LAB — BASIC METABOLIC PANEL
ANION GAP: 16 — AB (ref 5–15)
ANION GAP: 16 — AB (ref 5–15)
Anion gap: 8 (ref 5–15)
Anion gap: 7 (ref 5–15)
BUN: 51 mg/dL — ABNORMAL HIGH (ref 6–20)
BUN: 51 mg/dL — ABNORMAL HIGH (ref 6–20)
BUN: 49 mg/dL — AB (ref 6–20)
BUN: 48 mg/dL — AB (ref 6–20)
CALCIUM: 8.8 mg/dL — AB (ref 8.9–10.3)
CALCIUM: 9.3 mg/dL (ref 8.9–10.3)
CALCIUM: 9.1 mg/dL (ref 8.9–10.3)
CHLORIDE: 104 mmol/L (ref 101–111)
CHLORIDE: 109 mmol/L (ref 101–111)
CHLORIDE: 109 mmol/L (ref 101–111)
CO2: 16 mmol/L — ABNORMAL LOW (ref 22–32)
CO2: 14 mmol/L — AB (ref 22–32)
CO2: 23 mmol/L (ref 22–32)
CO2: 24 mmol/L (ref 22–32)
CREATININE: 2.24 mg/dL — AB (ref 0.61–1.24)
CREATININE: 2.01 mg/dL — AB (ref 0.61–1.24)
CREATININE: 2.02 mg/dL — AB (ref 0.61–1.24)
Calcium: 8.6 mg/dL — ABNORMAL LOW (ref 8.9–10.3)
Chloride: 101 mmol/L (ref 101–111)
Creatinine, Ser: 2.23 mg/dL — ABNORMAL HIGH (ref 0.61–1.24)
GFR calc non Af Amer: 25 mL/min — ABNORMAL LOW (ref >60)
GFR calc non Af Amer: 29 mL/min — ABNORMAL LOW (ref >60)
GFR calc non Af Amer: 28 mL/min — ABNORMAL LOW (ref >60)
GFR, EST AFRICAN AMERICAN: 29 mL/min — AB (ref >60)
GFR, EST AFRICAN AMERICAN: 29 mL/min — AB (ref >60)
GFR, EST AFRICAN AMERICAN: 33 mL/min — AB (ref >60)
GFR, EST AFRICAN AMERICAN: 33 mL/min — AB (ref >60)
GFR, EST NON AFRICAN AMERICAN: 25 mL/min — AB (ref >60)
GLUCOSE: 602 mg/dL — AB (ref 65–99)
GLUCOSE: 607 mg/dL — AB (ref 65–99)
GLUCOSE: 163 mg/dL — AB (ref 65–99)
GLUCOSE: 142 mg/dL — AB (ref 65–99)
POTASSIUM: 5.1 mmol/L (ref 3.5–5.1)
Potassium: 5.2 mmol/L — ABNORMAL HIGH (ref 3.5–5.1)
Potassium: 4 mmol/L (ref 3.5–5.1)
Potassium: 4.3 mmol/L (ref 3.5–5.1)
Sodium: 133 mmol/L — ABNORMAL LOW (ref 135–145)
Sodium: 134 mmol/L — ABNORMAL LOW (ref 135–145)
Sodium: 140 mmol/L (ref 135–145)
Sodium: 140 mmol/L (ref 135–145)

## 2016-05-05 LAB — URINALYSIS, ROUTINE W REFLEX MICROSCOPIC
BILIRUBIN URINE: NEGATIVE
Glucose, UA: >1000 mg/dL — AB
KETONES UR: 40 mg/dL — AB
NITRITE: NEGATIVE
PROTEIN: 30 mg/dL — AB
SPECIFIC GRAVITY, URINE: 1.013 (ref 1.005–1.030)
pH: 6 (ref 5.0–8.0)

## 2016-05-05 LAB — URINE MICROSCOPIC-ADD ON

## 2016-05-05 LAB — CBC
HCT: 35.3 % — ABNORMAL LOW (ref 39.0–52.0)
HCT: 35.5 % — ABNORMAL LOW (ref 39.0–52.0)
HEMOGLOBIN: 11 g/dL — AB (ref 13.0–17.0)
Hemoglobin: 11.6 g/dL — ABNORMAL LOW (ref 13.0–17.0)
MCH: 29.1 pg (ref 26.0–34.0)
MCH: 29.5 pg (ref 26.0–34.0)
MCHC: 31.2 g/dL (ref 30.0–36.0)
MCHC: 32.7 g/dL (ref 30.0–36.0)
MCV: 93.4 fL (ref 78.0–100.0)
MCV: 90.3 fL (ref 78.0–100.0)
PLATELETS: 403 10*3/uL — AB (ref 150–400)
PLATELETS: 432 10*3/uL — AB (ref 150–400)
RBC: 3.78 MIL/uL — ABNORMAL LOW (ref 4.22–5.81)
RBC: 3.93 MIL/uL — AB (ref 4.22–5.81)
RDW: 13.8 % (ref 11.5–15.5)
RDW: 13.3 % (ref 11.5–15.5)
WBC: 7 10*3/uL (ref 4.0–10.5)
WBC: 6.9 10*3/uL (ref 4.0–10.5)

## 2016-05-05 LAB — BLOOD GAS, VENOUS
ACID-BASE DEFICIT: 13.3 mmol/L — AB (ref 0.0–2.0)
Bicarbonate: 11.9 mmol/L — ABNORMAL LOW (ref 20.0–28.0)
O2 SAT: 95.2 %
PCO2 VEN: 26.1 mmHg — AB (ref 44.0–60.0)
PO2 VEN: 91.7 mmHg — AB (ref 32.0–45.0)
Patient temperature: 98.6
pH, Ven: 7.28 (ref 7.250–7.430)

## 2016-05-05 LAB — CBG MONITORING, ED
GLUCOSE-CAPILLARY: 550 mg/dL — AB (ref 65–99)
GLUCOSE-CAPILLARY: 339 mg/dL — AB (ref 65–99)
Glucose-Capillary: 524 mg/dL (ref 65–99)
Glucose-Capillary: 459 mg/dL — ABNORMAL HIGH (ref 65–99)
Glucose-Capillary: 444 mg/dL — ABNORMAL HIGH (ref 65–99)

## 2016-05-05 MED ORDER — TRAMADOL HCL 50 MG PO TABS: 50.0000 mg | ORAL_TABLET | Freq: Four times a day (QID) | ORAL | Status: DC | PRN

## 2016-05-05 MED ORDER — DEXTROSE-NACL 5-0.45 % IV SOLN
INTRAVENOUS | Status: DC
  Administered 2016-05-05 – 2016-05-06 (×2): via INTRAVENOUS

## 2016-05-05 MED ORDER — INSULIN GLARGINE 300 UNIT/ML ~~LOC~~ SOPN: 14.0000 [IU] | PEN_INJECTOR | Freq: Every day | SUBCUTANEOUS | Status: DC

## 2016-05-05 MED ORDER — HEPARIN SODIUM (PORCINE) 5000 UNIT/ML IJ SOLN
5000.0000 [IU] | Freq: Three times a day (TID) | INTRAMUSCULAR | Status: DC
  Administered 2016-05-05 – 2016-05-07 (×5): 5000 [IU] via SUBCUTANEOUS
  Filled 2016-05-05 (×6): qty 1

## 2016-05-05 MED ORDER — INSULIN ASPART 100 UNIT/ML ~~LOC~~ SOLN: 5.0000 [IU] | Freq: Three times a day (TID) | SUBCUTANEOUS | Status: DC

## 2016-05-05 MED ORDER — ALBUTEROL SULFATE (2.5 MG/3ML) 0.083% IN NEBU: 2.5000 mg | INHALATION_SOLUTION | Freq: Four times a day (QID) | RESPIRATORY_TRACT | Status: DC | PRN

## 2016-05-05 MED ORDER — SACCHAROMYCES BOULARDII 250 MG PO CAPS
250.0000 mg | ORAL_CAPSULE | Freq: Two times a day (BID) | ORAL | Status: DC
  Administered 2016-05-05 – 2016-05-07 (×5): 250 mg via ORAL
  Filled 2016-05-05 (×5): qty 1

## 2016-05-05 MED ORDER — SODIUM CHLORIDE 0.9 % IV BOLUS (SEPSIS)
1000.0000 mL | Freq: Once | INTRAVENOUS | Status: AC
  Administered 2016-05-05: 1000 mL via INTRAVENOUS

## 2016-05-05 MED ORDER — POLYETHYLENE GLYCOL 3350 17 G PO PACK
17.0000 g | PACK | Freq: Every day | ORAL | Status: DC
  Administered 2016-05-05 – 2016-05-07 (×3): 17 g via ORAL
  Filled 2016-05-05 (×3): qty 1

## 2016-05-05 MED ORDER — INSULIN ASPART 100 UNIT/ML ~~LOC~~ SOLN
10.0000 [IU] | Freq: Every day | SUBCUTANEOUS | Status: DC
  Administered 2016-05-05 – 2016-05-06 (×2): 10 [IU] via SUBCUTANEOUS

## 2016-05-05 MED ORDER — ACETAMINOPHEN 500 MG PO TABS: 500.0000 mg | ORAL_TABLET | Freq: Three times a day (TID) | ORAL | Status: DC | PRN

## 2016-05-05 MED ORDER — INSULIN ASPART 100 UNIT/ML ~~LOC~~ SOLN
0.0000 [IU] | Freq: Three times a day (TID) | SUBCUTANEOUS | Status: DC
  Administered 2016-05-06: 5 [IU] via SUBCUTANEOUS
  Administered 2016-05-06 (×2): 2 [IU] via SUBCUTANEOUS
  Administered 2016-05-07: 7 [IU] via SUBCUTANEOUS

## 2016-05-05 MED ORDER — SERTRALINE HCL 50 MG PO TABS
50.0000 mg | ORAL_TABLET | Freq: Every day | ORAL | Status: DC
  Administered 2016-05-05 – 2016-05-06 (×2): 50 mg via ORAL
  Filled 2016-05-05 (×4): qty 1

## 2016-05-05 MED ORDER — BISMUTH SUBSALICYLATE 262 MG PO CHEW
524.0000 mg | CHEWABLE_TABLET | ORAL | Status: DC | PRN
  Filled 2016-05-05: qty 2

## 2016-05-05 MED ORDER — SODIUM CHLORIDE 0.9 % IV SOLN: INTRAVENOUS | Status: DC

## 2016-05-05 MED ORDER — AMLODIPINE BESYLATE 5 MG PO TABS
5.0000 mg | ORAL_TABLET | Freq: Every day | ORAL | Status: DC
  Administered 2016-05-05 – 2016-05-07 (×3): 5 mg via ORAL
  Filled 2016-05-05 (×3): qty 1

## 2016-05-05 MED ORDER — INSULIN ASPART 100 UNIT/ML ~~LOC~~ SOLN
10.0000 [IU] | Freq: Every day | SUBCUTANEOUS | Status: DC
  Administered 2016-05-06: 10 [IU] via SUBCUTANEOUS

## 2016-05-05 MED ORDER — POTASSIUM CHLORIDE 10 MEQ/100ML IV SOLN
10.0000 meq | INTRAVENOUS | Status: AC
  Administered 2016-05-05 (×2): 10 meq via INTRAVENOUS
  Filled 2016-05-05: qty 100

## 2016-05-05 MED ORDER — SODIUM CHLORIDE 0.9 % IV SOLN
INTRAVENOUS | Status: DC
  Filled 2016-05-05: qty 2.5

## 2016-05-05 MED ORDER — MOMETASONE FURO-FORMOTEROL FUM 100-5 MCG/ACT IN AERO
2.0000 | INHALATION_SPRAY | Freq: Two times a day (BID) | RESPIRATORY_TRACT | Status: DC
  Administered 2016-05-05 – 2016-05-07 (×3): 2 via RESPIRATORY_TRACT
  Filled 2016-05-05 (×2): qty 8.8

## 2016-05-05 MED ORDER — INSULIN GLARGINE 100 UNIT/ML ~~LOC~~ SOLN
14.0000 [IU] | Freq: Every day | SUBCUTANEOUS | Status: DC
  Administered 2016-05-05 – 2016-05-06 (×2): 14 [IU] via SUBCUTANEOUS
  Filled 2016-05-05 (×3): qty 0.14

## 2016-05-05 MED ORDER — METHOCARBAMOL 500 MG PO TABS: 500.0000 mg | ORAL_TABLET | Freq: Three times a day (TID) | ORAL | Status: DC | PRN

## 2016-05-05 MED ORDER — DEXTROSE-NACL 5-0.45 % IV SOLN: INTRAVENOUS | Status: DC

## 2016-05-05 MED ORDER — INSULIN ASPART 100 UNIT/ML ~~LOC~~ SOLN
5.0000 [IU] | Freq: Every day | SUBCUTANEOUS | Status: DC
  Administered 2016-05-06 – 2016-05-07 (×2): 5 [IU] via SUBCUTANEOUS

## 2016-05-05 MED ORDER — SODIUM CHLORIDE 0.9 % IV SOLN
INTRAVENOUS | Status: DC
  Administered 2016-05-05: 4.6 [IU]/h via INTRAVENOUS
  Filled 2016-05-05: qty 2.5

## 2016-05-05 MED ORDER — ATORVASTATIN CALCIUM 10 MG PO TABS
20.0000 mg | ORAL_TABLET | Freq: Every day | ORAL | Status: DC
  Administered 2016-05-05 – 2016-05-06 (×2): 20 mg via ORAL
  Filled 2016-05-05 (×2): qty 2

## 2016-05-05 MED ORDER — SIMVASTATIN 40 MG PO TABS: 40.0000 mg | ORAL_TABLET | Freq: Every day | ORAL | Status: DC

## 2016-05-05 MED ORDER — VITAMIN D3 25 MCG (1000 UNIT) PO TABS
1000.0000 [IU] | ORAL_TABLET | Freq: Every day | ORAL | Status: DC
  Administered 2016-05-05 – 2016-05-07 (×3): 1000 [IU] via ORAL
  Filled 2016-05-05 (×3): qty 1

## 2016-05-05 NOTE — H&P (Addendum)
TRH H&P   Patient Demographics:    Benjamin Riddle, is a 82 y.o. male  MRN: [id number]   DOB - April 16, 1928  Admit Date - 05/05/2016  Outpatient Primary MD for the patient is Bufford SpikesEED, TIFFANY, DO  Referring MD/NP/PA: Dr Ruthe MannanWantz  Outpatient Specialists: Endocrinology Dr. Everardo AllEllison  Patient coming from: SNF  Chief Complaint  Patient presents with  . 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, Retired physician, history of diabetes mellitus, insulin-dependent, very brittle, COPD, hypertension, hyperlipidemia, presents from SNF for hyperglycemia, facility has been having hard time controlling his CBG overnight, as well over all it has been consistently elevated around 200 range over last 24 hours, so was sent to ED for further evaluation, especially he is more lethargic and confused than his baseline, patient denies any focal deficits, mentation back to baseline, he denies any chest pain, shortness of breath, diaphoresis, dizziness or lightheadedness, patient with known history of brittle diabetes, with history of hypoglycemia in the past, has been refusing some of his sliding scale coverage given fear of hypoglycemia, as well as the complaining of chronic hip pain for last couple weeks, denies any falls, - IN ED workup significant for mild to moderate DKA, with anion gap of 16, bicarbonate 14, pH of 7.28 and glucose of 607, creatinine around baseline of 2.2 .    Review of systems:    In addition to the HPI above,  No Fever-chills, No Headache, No changes with Vision or hearing, No problems swallowing food or Liquids, No Chest pain, Cough or Shortness of Breath, No Abdominal pain, No Nausea or Vommitting, Bowel movements are regular, No Blood in stool or Urine, No dysuria, No new skin rashes or bruises, Complains of hip pain, but denies any full No new focal weakness,  tingling, numbness in any extremity, at with generalized weakness No recent weight gain or loss, No polyuria, polydypsia or polyphagia, No significant Mental Stressors.  A full 10 point Review of Systems was done, except as stated above, all other Review of Systems were negative.   With Past History of the following :    Past Medical History:  Diagnosis Date  . Allergic rhinitis   . Allergy   . Asthma   . Cataract   . CKD (chronic kidney disease), stage III   . Combined systolic and diastolic congestive heart failure (HCC)   . COPD (chronic obstructive pulmonary disease) (HCC)   . Diabetes mellitus   . Heart murmur   . Hepatitis C    due to blood transfusion  . Hydronephrosis   . Hyperlipidemia   . Hypertension   . Hypogonadism male   . Kidney disease    Stage III chronic kidney disease  . Neurogenic bladder   . Osteoporosis   . Recurrent urinary tract infection    With resistent Pseudomonas  .  SOB (shortness of breath)       Past Surgical History:  Procedure Laterality Date  . BLADDER REPAIR     obstruction surgery  . CATARACT EXTRACTION  2015  . EYE SURGERY    . FRACTURE SURGERY    . FRONTAL SINUS OBLITERATION     sinus repaired with a plate  . HERNIA REPAIR    . PROSTATE SURGERY    . SPINE SURGERY    . TONSILLECTOMY        Social History:     Social History  Substance Use Topics  . Smoking status: Former Smoker    Types: Cigars, Cigarettes    Quit date: 07/21/2011  . Smokeless tobacco: Never Used     Comment: smoked occ cigar and pipe  . Alcohol use No     Lives - SNF  Mobility - wheelchair dependent    Family History :     Family History  Problem Relation Age of Onset  . Heart failure Mother   . Hypertension Mother   . COPD Father     smoked  . Diabetes Father   . Cancer Sister     Breast  . Diabetes Sister   . COPD Brother     smoked      Home Medications:   Prior to Admission medications   Medication Sig Start Date End  Date Taking? Authorizing Provider  acetaminophen (TYLENOL) 500 MG tablet Take 500 mg by mouth every 8 (eight) hours as needed for mild pain, moderate pain or fever.   Yes Historical Provider, MD  albuterol (PROVENTIL HFA;VENTOLIN HFA) 108 (90 Base) MCG/ACT inhaler Inhale 2 puffs into the lungs every 4 (four) hours as needed for wheezing or shortness of breath.   Yes Historical Provider, MD  albuterol (PROVENTIL) (2.5 MG/3ML) 0.083% nebulizer solution Take 2.5 mg by nebulization every 6 (six) hours as needed for wheezing or shortness of breath.   Yes Historical Provider, MD  amLODipine (NORVASC) 5 MG tablet Take 5 mg by mouth daily.   Yes Historical Provider, MD  bismuth subsalicylate (PEPTO BISMOL) 262 MG chewable tablet Chew 524 mg by mouth as needed for indigestion.    Yes Historical Provider, MD  cephALEXin (KEFLEX) 250 MG capsule Take 250 mg by mouth every morning.   Yes Historical Provider, MD  cholecalciferol (VITAMIN D) 1000 UNITS tablet Take 1,000 Units by mouth daily.   Yes Historical Provider, MD  feeding supplement, GLUCERNA SHAKE, (GLUCERNA SHAKE) LIQD Take 237 mLs by mouth daily as needed (nutritional supplement).    Yes Historical Provider, MD  insulin aspart (NOVOLOG) 100 UNIT/ML injection Inject 5-10 Units into the skin 3 (three) times daily before meals. 5 units at lunch, 10 units at breakfast and supper. Give 1 units for every 1 unit over 150.   Yes Historical Provider, MD  Insulin Glargine (TOUJEO SOLOSTAR) 300 UNIT/ML SOPN Inject 18 Units into the skin at bedtime.    Yes Historical Provider, MD  Menthol, Topical Analgesic, (BIOFREEZE EX) Apply 1 application topically 4 (four) times daily as needed.   Yes Historical Provider, MD  methocarbamol (ROBAXIN) 500 MG tablet Take 500 mg by mouth every 8 (eight) hours as needed for muscle spasms.    Yes Historical Provider, MD  mometasone-formoterol (DULERA) 100-5 MCG/ACT AERO Inhale 2 puffs into the lungs 2 (two) times daily.   Yes Historical  Provider, MD  polyethylene glycol (MIRALAX / GLYCOLAX) packet Take 17 g by mouth daily.   Yes Historical Provider, MD  saccharomyces boulardii (FLORASTOR) 250 MG capsule Take 250 mg by mouth 2 (two) times daily.    Yes Historical Provider, MD  sertraline (ZOLOFT) 50 MG tablet Take 50 mg by mouth at bedtime.    Yes Historical Provider, MD  simvastatin (ZOCOR) 40 MG tablet Take 40 mg by mouth daily.     Yes Historical Provider, MD  traMADol (ULTRAM) 50 MG tablet Take 50 mg by mouth every 6 (six) hours as needed for severe pain.   Yes Historical Provider, MD     Allergies:     Allergies  Allergen Reactions  . Aspirin Other (See Comments)    Per patient states causes him to bleed easily   . Bactrim [Sulfamethoxazole-Trimethoprim] Nausea And Vomiting  . Codeine Nausea And Vomiting  . Gentamicin Other (See Comments)    Becomes dizzy and weak  . Lexapro [Escitalopram Oxalate] Other (See Comments)    Unknown to patient  . Protonix [Pantoprazole Sodium] Diarrhea  . Ciprofloxacin Itching and Rash     Physical Exam:   Vitals  Blood pressure 133/65, pulse 80, temperature 98.5 F (36.9 C), temperature source Oral, resp. rate 19, height 5\' 9"  (1.753 m), weight 61.2 kg (135 lb), SpO2 97 %.   1. General Frail, elderly, chronically ill-appearing , very thin male lying in bed in NAD,  2. Communicative, pleasant, not very talkative, Awake Alert, Oriented X 3.  3. No F.N deficits, ALL C.Nerves Intact, Strength 5/5 all 4 extremities, Sensation intact all 4 extremities, Plantars down going.  4. Ears and Eyes appear Normal, Conjunctivae clear, PERRLA. Dry Oral Mucosa.  5. Supple Neck, No JVD, No cervical lymphadenopathy appriciated, No Carotid Bruits.  6. Symmetrical Chest wall movement, Good air movement bilaterally, CTAB.  7. RRR, No Gallops, Rubs or Murmurs, No Parasternal Heave.  8. Positive Bowel Sounds, Abdomen Soft, No organomegaly appriciated,No rebound -guarding or rigidity.  9.   No Cyanosis, Normal Skin Turgor, No Skin Rash or Bruise.  10. Significant muscle wasting,  joints appear normal , no effusions, Normal ROM.  11. No Palpable Lymph Nodes in Neck or Axillae    Data Review:    CBC  Recent Labs Lab 05/05/16 0650  WBC 7.0  HGB 11.0*  HCT 35.3*  PLT 403*  MCV 93.4  MCH 29.1  MCHC 31.2  RDW 13.8   ------------------------------------------------------------------------------------------------------------------  Chemistries   Recent Labs Lab 05/05/16 0650 05/05/16 0931  NA 133* 134*  K 5.2* 5.1  CL 101 104  CO2 16* 14*  GLUCOSE 602* 607*  BUN 51* 51*  CREATININE 2.24* 2.23*  CALCIUM 8.8* 8.6*   ------------------------------------------------------------------------------------------------------------------ estimated creatinine clearance is 21 mL/min (by C-G formula based on SCr of 2.23 mg/dL (H)). ------------------------------------------------------------------------------------------------------------------ No results for input(s): TSH, T4TOTAL, T3FREE, THYROIDAB in the last 72 hours.  Invalid input(s): FREET3  Coagulation profile No results for input(s): INR, PROTIME in the last 168 hours. ------------------------------------------------------------------------------------------------------------------- No results for input(s): DDIMER in the last 72 hours. -------------------------------------------------------------------------------------------------------------------  Cardiac Enzymes No results for input(s): CKMB, TROPONINI, MYOGLOBIN in the last 168 hours.  Invalid input(s): CK ------------------------------------------------------------------------------------------------------------------ No results found for: BNP   ---------------------------------------------------------------------------------------------------------------  Urinalysis    Component Value Date/Time   COLORURINE YELLOW 05/05/2016 0748    APPEARANCEUR TURBID (A) 05/05/2016 0748   LABSPEC 1.013 05/05/2016 0748   PHURINE 6.0 05/05/2016 0748   GLUCOSEU >1000 (A) 05/05/2016 0748   HGBUR SMALL (A) 05/05/2016 0748   BILIRUBINUR NEGATIVE 05/05/2016 0748   KETONESUR 40 (A) 05/05/2016 0748   PROTEINUR  30 (A) 05/05/2016 0748   UROBILINOGEN 0.2 04/06/2015 2033   NITRITE NEGATIVE 05/05/2016 0748   LEUKOCYTESUR LARGE (A) 05/05/2016 0748    ----------------------------------------------------------------------------------------------------------------   Imaging Results:    Dg Chest 2 View  Result Date: 05/05/2016 CLINICAL DATA:  Hyperglycemia.  Hypertension. EXAM: CHEST  2 VIEW COMPARISON:  April 10, 2016 FINDINGS: There is a small right pleural effusion. There is no evident edema or consolidation. Heart size and pulmonary vascularity are normal. There is atherosclerotic calcification in the aorta. There is degenerative change in the lower thoracic spine. IMPRESSION: Persistent small right pleural effusion. No edema or consolidation. Stable cardiac silhouette. There is aortic atherosclerosis. Electronically Signed   By: Bretta Bang III M.D.   On: 05/05/2016 09:14   Dg Hip Unilat With Pelvis 2-3 Views Left  Result Date: 05/05/2016 CLINICAL DATA:  Left hip pain for 1 week, no known injury, initial encounter EXAM: DG HIP (WITH OR WITHOUT PELVIS) 3V LEFT COMPARISON:  None. FINDINGS: No acute fracture or dislocation is seen. Mild degenerative changes of the hip joints are noted bilaterally. No soft tissue abnormality is seen. IMPRESSION: No acute abnormality noted. Electronically Signed   By: Alcide Clever M.D.   On: 05/05/2016 13:19      Assessment & Plan:    Active Problems:   COPD GOLD III    Combined systolic and diastolic congestive heart failure (HCC)   DKA (diabetic ketoacidoses) (HCC)   Bladder outlet obstruction   Memory loss   Chronic kidney disease (CKD), stage IV (severe) (HCC)   Depression  Diabetes mellitus  type 2 with mild to moderate DKA - Patient presents with mild acidosis, pH of 7.28, anion gap of 16, and bicarbonate of 14 - Admitted under DKA pathway, started on insulin drip, we'll keep nothing by mouth, IV fluids, will monitor CMP every 4 hours and replete electrolytes as needed, once in gap closed, we'll resume on home regimen of [place] and insulin sliding scale.  Abnormal urinalysis - Patient afebrile, leukocytosis, this is most likely related to chronic Foley catheter, consistently over last year all his urinalysis are upper normal, hold on starting any antibiotics pending cultures.  Urinary retention/chronic Foley - Foley catheter will be changed in ED given abnormal urinalysis  Failure to thrive - Patient with progressive weakness, wheelchair dependent, appears to be with malnutrition, cachectic appearance will consult PT, will add protein supplementation 1 able to take oral  Dyslipidemia - Continue with statin  Hypertension - Continue home medication  CKD stage IV - Creatinine at baseline which is close to 2 2  Hip pain - Chronic, most likely due to osteoarthritis, no acute finding on x-ray  COPD - No active wheezing, continue with home medication including Dulera  DVT Prophylaxis Heparin -   SCDs  AM Labs Ordered, also please review Full Orders  Family Communication: Admission, patients condition and plan of care including tests being ordered have been discussed with the patient and wife who indicate understanding and agree with the plan and Code Status.  Code Status Full (confirmed by patient, wife at bedside, even though patient had DO NOT RESUSCITATE form from facility)  Likely DC to  Back to SNF  Condition GUARDED    Consults called: None  Admission status: inpatient, as he presents with DKA, with pH of 7.28, where he required to be an insulin GTT.  Time spent in minutes : 55 minutes   Staci Dack M.D on 05/05/2016 at 1:47 PM  Between 7am to 7pm -  Pager - [phone]. After 7pm go to [email or web address] - password [facility]  [facility] - Office  [phone]

## 2016-05-05 NOTE — ED Notes (Signed)
Patient transported to X-ray 

## 2016-05-05 NOTE — ED Triage Notes (Signed)
Per EMS [facility]. EMS advised that pt CBG was over 500 and staff administered insulin. EMS established IV access and CBG was 502 at last reading. No other complaints from pt at this time.

## 2016-05-05 NOTE — ED Notes (Signed)
9:30 glucose 607.

## 2016-05-05 NOTE — ED Notes (Signed)
Bed: [id number] Expected date:  Expected time:  Means of arrival:  Comments: 82 yo M /hyperglycemia

## 2016-05-05 NOTE — ED Provider Notes (Signed)
[facility]-EMERGENCY DEPT Provider Note   CSN: [id number] Arrival date & time: 05/05/16  0865     History   Chief Complaint Chief Complaint  Patient presents with  . Hyperglycemia    HPI Benjamin Riddle is a 82 y.o. male.  He is here for evaluation of hyperglycemia, noted today at his nursing care facility. His wife reports "they could not get it down". The patient cannot give any additional information  Level V caveat- altered mental status  HPI  Past Medical History:  Diagnosis Date  . Allergic rhinitis   . Allergy   . Asthma   . Cataract   . CKD (chronic kidney disease), stage III   . Combined systolic and diastolic congestive heart failure (HCC)   . COPD (chronic obstructive pulmonary disease) (HCC)   . Diabetes mellitus   . Heart murmur   . Hepatitis C    due to blood transfusion  . Hydronephrosis   . Hyperlipidemia   . Hypertension   . Hypogonadism male   . Kidney disease    Stage III chronic kidney disease  . Neurogenic bladder   . Osteoporosis   . Recurrent urinary tract infection    With resistent Pseudomonas  . SOB (shortness of breath)     Patient Active Problem List   Diagnosis Date Noted  . HCAP (healthcare-associated pneumonia) 04/06/2016  . Depression 04/06/2016  . Chronic kidney disease (CKD), stage IV (severe) (HCC) 03/30/2016  . Memory loss 03/12/2016  . Diabetes mellitus type 1, uncontrolled (HCC) 03/12/2016  . UTI (lower urinary tract infection) 01/27/2016  . Type 1 diabetes mellitus with ketoacidosis, uncontrolled (HCC) 01/19/2016  . Fall   . Bladder outlet obstruction 04/06/2015  . Protein-calorie malnutrition, severe (HCC) 01/25/2015  . DKA (diabetic ketoacidoses) (HCC) 01/23/2015  . Combined systolic and diastolic congestive heart failure (HCC)   . COPD GOLD III  11/23/2014  . Asthmatic bronchitis , chronic (HCC) 08/31/2014  . HLD (hyperlipidemia) 12/09/2009  . HTN (hypertension) 12/09/2009    Past Surgical History:  Procedure  Laterality Date  . BLADDER REPAIR     obstruction surgery  . CATARACT EXTRACTION  2015  . EYE SURGERY    . FRACTURE SURGERY    . FRONTAL SINUS OBLITERATION     sinus repaired with a plate  . HERNIA REPAIR    . PROSTATE SURGERY    . SPINE SURGERY    . TONSILLECTOMY         Home Medications    Prior to Admission medications   Medication Sig Start Date End Date Taking? Authorizing Provider  acetaminophen (TYLENOL) 500 MG tablet Take 500 mg by mouth every 8 (eight) hours as needed for mild pain, moderate pain or fever.   Yes Historical Provider, MD  albuterol (PROVENTIL HFA;VENTOLIN HFA) 108 (90 Base) MCG/ACT inhaler Inhale 2 puffs into the lungs every 4 (four) hours as needed for wheezing or shortness of breath.   Yes Historical Provider, MD  albuterol (PROVENTIL) (2.5 MG/3ML) 0.083% nebulizer solution Take 2.5 mg by nebulization every 6 (six) hours as needed for wheezing or shortness of breath.   Yes Historical Provider, MD  amLODipine (NORVASC) 5 MG tablet Take 5 mg by mouth daily.   Yes Historical Provider, MD  bismuth subsalicylate (PEPTO BISMOL) 262 MG chewable tablet Chew 524 mg by mouth as needed for indigestion.    Yes Historical Provider, MD  cephALEXin (KEFLEX) 250 MG capsule Take 250 mg by mouth every morning.   Yes Historical Provider, MD  cholecalciferol (VITAMIN D) 1000 UNITS tablet Take 1,000 Units by mouth daily.   Yes Historical Provider, MD  feeding supplement, GLUCERNA SHAKE, (GLUCERNA SHAKE) LIQD Take 237 mLs by mouth daily as needed (nutritional supplement).    Yes Historical Provider, MD  insulin aspart (NOVOLOG) 100 UNIT/ML injection Inject 5-10 Units into the skin 3 (three) times daily before meals. 5 units at lunch, 10 units at breakfast and supper. Give 1 units for every 1 unit over 150.   Yes Historical Provider, MD  Insulin Glargine (TOUJEO SOLOSTAR) 300 UNIT/ML SOPN Inject 18 Units into the skin at bedtime.    Yes Historical Provider, MD  Menthol, Topical  Analgesic, (BIOFREEZE EX) Apply 1 application topically 4 (four) times daily as needed.   Yes Historical Provider, MD  methocarbamol (ROBAXIN) 500 MG tablet Take 500 mg by mouth every 8 (eight) hours as needed for muscle spasms.    Yes Historical Provider, MD  mometasone-formoterol (DULERA) 100-5 MCG/ACT AERO Inhale 2 puffs into the lungs 2 (two) times daily.   Yes Historical Provider, MD  polyethylene glycol (MIRALAX / GLYCOLAX) packet Take 17 g by mouth daily.   Yes Historical Provider, MD  saccharomyces boulardii (FLORASTOR) 250 MG capsule Take 250 mg by mouth 2 (two) times daily.    Yes Historical Provider, MD  sertraline (ZOLOFT) 50 MG tablet Take 50 mg by mouth at bedtime.    Yes Historical Provider, MD  simvastatin (ZOCOR) 40 MG tablet Take 40 mg by mouth daily.     Yes Historical Provider, MD  traMADol (ULTRAM) 50 MG tablet Take 50 mg by mouth every 6 (six) hours as needed for severe pain.   Yes Historical Provider, MD    Family History Family History  Problem Relation Age of Onset  . Heart failure Mother   . Hypertension Mother   . COPD Father     smoked  . Diabetes Father   . Cancer Sister     Breast  . Diabetes Sister   . COPD Brother     smoked    Social History Social History  Substance Use Topics  . Smoking status: Former Smoker    Types: Cigars, Cigarettes    Quit date: 07/21/2011  . Smokeless tobacco: Never Used     Comment: smoked occ cigar and pipe  . Alcohol use No     Allergies   Aspirin; Bactrim [sulfamethoxazole-trimethoprim]; Codeine; Gentamicin; Lexapro [escitalopram oxalate]; Protonix [pantoprazole sodium]; and Ciprofloxacin   Review of Systems Review of Systems  Unable to perform ROS: Mental status change     Physical Exam Updated Vital Signs BP 133/65 (BP Location: Right Arm)   Pulse 80   Temp 98.5 F (36.9 C) (Oral)   Resp 19   Ht 5\' 9"  (1.753 m)   Wt 135 lb (61.2 kg)   SpO2 97%   BMI 19.94 kg/m   Physical Exam  Constitutional: He  appears well-developed. He appears distressed (He is sleepy but arousable and answers questions, is confused.).  Elderly, frail  HENT:  Head: Normocephalic and atraumatic.  Right Ear: External ear normal.  Left Ear: External ear normal.  Oral mucous membranes are dry.  Eyes: Conjunctivae and EOM are normal. Pupils are equal, round, and reactive to light.  Neck: Normal range of motion and phonation normal. Neck supple.  Cardiovascular: Normal rate, regular rhythm and normal heart sounds.   Pulmonary/Chest: Effort normal and breath sounds normal. No respiratory distress. He exhibits no bony tenderness.  Abdominal: Soft. There is  no tenderness.  Musculoskeletal: Normal range of motion.  Neurological: He is alert. No cranial nerve deficit or sensory deficit. He exhibits normal muscle tone. Coordination normal.  Skin: Skin is warm, dry and intact.  Psychiatric: He has a normal mood and affect. His behavior is normal. Judgment and thought content normal.  Nursing note and vitals reviewed.    ED Treatments / Results  Labs (all labs ordered are listed, but only abnormal results are displayed) Labs Reviewed  BASIC METABOLIC PANEL - Abnormal; Notable for the following:       Result Value   Sodium 133 (*)    Potassium 5.2 (*)    CO2 16 (*)    Glucose, Bld 602 (*)    BUN 51 (*)    Creatinine, Ser 2.24 (*)    Calcium 8.8 (*)    GFR calc non Af Amer 25 (*)    GFR calc Af Amer 29 (*)    Anion gap 16 (*)    All other components within normal limits  CBC - Abnormal; Notable for the following:    RBC 3.78 (*)    Hemoglobin 11.0 (*)    HCT 35.3 (*)    Platelets 403 (*)    All other components within normal limits  URINALYSIS, ROUTINE W REFLEX MICROSCOPIC (NOT AT [facility]) - Abnormal; Notable for the following:    APPearance TURBID (*)    Glucose, UA >1000 (*)    Hgb urine dipstick SMALL (*)    Ketones, ur 40 (*)    Protein, ur 30 (*)    Leukocytes, UA LARGE (*)    All other components  within normal limits  URINE MICROSCOPIC-ADD ON - Abnormal; Notable for the following:    Squamous Epithelial / LPF 0-5 (*)    Bacteria, UA FEW (*)    All other components within normal limits  BASIC METABOLIC PANEL - Abnormal; Notable for the following:    Sodium 134 (*)    CO2 14 (*)    Glucose, Bld 607 (*)    BUN 51 (*)    Creatinine, Ser 2.23 (*)    Calcium 8.6 (*)    GFR calc non Af Amer 25 (*)    GFR calc Af Amer 29 (*)    Anion gap 16 (*)    All other components within normal limits  BLOOD GAS, VENOUS - Abnormal; Notable for the following:    pCO2, Ven 26.1 (*)    pO2, Ven 91.7 (*)    Bicarbonate 11.9 (*)    Acid-base deficit 13.3 (*)    All other components within normal limits  CBG MONITORING, ED - Abnormal; Notable for the following:    Glucose-Capillary 550 (*)    All other components within normal limits  CBG MONITORING, ED - Abnormal; Notable for the following:    Glucose-Capillary 524 (*)    All other components within normal limits  CBG MONITORING, ED - Abnormal; Notable for the following:    Glucose-Capillary 459 (*)    All other components within normal limits  CBG MONITORING, ED - Abnormal; Notable for the following:    Glucose-Capillary 444 (*)    All other components within normal limits  CBG MONITORING, ED - Abnormal; Notable for the following:    Glucose-Capillary 339 (*)    All other components within normal limits    EKG  EKG Interpretation None       Radiology Dg Chest 2 View  Result Date: 05/05/2016 CLINICAL DATA:  Hyperglycemia.  Hypertension. EXAM: CHEST  2 VIEW COMPARISON:  April 10, 2016 FINDINGS: There is a small right pleural effusion. There is no evident edema or consolidation. Heart size and pulmonary vascularity are normal. There is atherosclerotic calcification in the aorta. There is degenerative change in the lower thoracic spine. IMPRESSION: Persistent small right pleural effusion. No edema or consolidation. Stable cardiac  silhouette. There is aortic atherosclerosis. Electronically Signed   By: Bretta Bang III M.D.   On: 05/05/2016 09:14   Dg Hip Unilat With Pelvis 2-3 Views Left  Result Date: 05/05/2016 CLINICAL DATA:  Left hip pain for 1 week, no known injury, initial encounter EXAM: DG HIP (WITH OR WITHOUT PELVIS) 3V LEFT COMPARISON:  None. FINDINGS: No acute fracture or dislocation is seen. Mild degenerative changes of the hip joints are noted bilaterally. No soft tissue abnormality is seen. IMPRESSION: No acute abnormality noted. Electronically Signed   By: Alcide Clever M.D.   On: 05/05/2016 13:19    Procedures Procedures (including critical care time)  Medications Ordered in ED Medications  dextrose 5 %-0.45 % sodium chloride infusion (not administered)  insulin regular (NOVOLIN R,HUMULIN R) 250 Units in sodium chloride 0.9 % 250 mL (1 Units/mL) infusion (5.6 Units/hr Intravenous Rate/Dose Change 05/05/16 1334)  sodium chloride 0.9 % bolus 1,000 mL (0 mLs Intravenous Stopped 05/05/16 0850)     Initial Impression / Assessment and Plan / ED Course  I have reviewed the triage vital signs and the nursing notes.  Pertinent labs & imaging results that were available during my care of the patient were reviewed by me and considered in my medical decision making (see chart for details).  Clinical Course    Medications  dextrose 5 %-0.45 % sodium chloride infusion (not administered)  insulin regular (NOVOLIN R,HUMULIN R) 250 Units in sodium chloride 0.9 % 250 mL (1 Units/mL) infusion (5.6 Units/hr Intravenous Rate/Dose Change 05/05/16 1334)  sodium chloride 0.9 % bolus 1,000 mL (0 mLs Intravenous Stopped 05/05/16 0850)    Patient Vitals for the past 24 hrs:  BP Temp Temp src Pulse Resp SpO2 Height Weight  05/05/16 1215 - - - 80 19 97 % - -  05/05/16 1203 133/65 - - 81 21 94 % - -  05/05/16 1200 (!) 122/104 - - - 24 - - -  05/05/16 1000 145/77 - - 87 21 97 % - -  05/05/16 0910 162/90 - - 89 20  97 % - -  05/05/16 0637 - - - - - - 5\' 9"  (1.753 m) 135 lb (61.2 kg)  05/05/16 0633 158/80 98.5 F (36.9 C) Oral 85 20 97 % - -    11:08 AM Reevaluation with update and discussion. After initial assessment and treatment, an updated evaluation reveals No change in clinical status. Wife updated on findings and plan. Benjamin Riddle L  _________________________________________________________________________ The patient was recently evaluated by his endocrinologist, made some changes in his treatment for diabetes, because of periods of early morning morning hypoglycemia, and late afternoon hyperglycemia. See below: Type 1 DM: overcontrolled.  Severe hypoglycemia.  In this setting, he is not a candidate for aggressive glycemic control.  Dementia: this further complicates the rx of DM.  Patient is advised the following: Patient Instructions  Please continue the same toujeo (15 units QHS), and: Take 9 units of novolog 3 times a day (just before each meal) Also, take 1 extra unit of novolog for any cbg over 300. check your blood sugar 4 times a day.  before the 3 meals, and at bedtime.  also check if you have symptoms of your blood sugar being too high or too low.  please keep a record of the readings and bring it to your next appointment here (or you can bring the meter itself).  You can write it on any piece of paper.  please call us sooner if your blood sugar goes below 70, or if you have a lot of readings over 200. Please fax [place] cbg record in 2-3 days, and send with resident to each ov here.  Please come back for a follow-up appointment in 6 weeks.        Electronically signed by Romero Belling, MD at 04/22/2016 7:50 PM    ___________________________________________________________________________  12:31 PM- Patient remains clinically unchanged. Patient's wife states that he has been having left hip pain for several days and his doctor wanted to x-ray it, that has not been done yet. There is  been no fall. He is in bed most of time but sometimes gets up in a wheelchair to go to meals. She reports that he has, "fragile diabetes".  13:00- I discussed the case with the patient's nurse at his left, Carollee Herter, who states that the patient typically runs with CBGs in the 200s and has been stable with that for the last 2 weeks, Intal 3 AM this morning when his glucometer was checked, and it read "high". It was high again at 5 AM, so he was sent here for evaluation. There is also very transient elevation of blood sugar at 8 PM last night of 526. However, when it was rechecked it was 100. He is reportedly taking all of his medicines as directed and last received  Joujeo 18 units, and NovoLog 8 units, at 9 PM last night.    1:31 PM-Consult complete with Hospitalist. Patient case explained and discussed. He agrees to admit patient for further evaluation and treatment. Call ended at 13:40  Final Clinical Impressions(s) / ED Diagnoses   Final diagnoses:  Hyperglycemia  AKI (acute kidney injury) (HCC)  Hyperkalemia   Hyperglycemia, cause unclear. Abnormal urine, and a chronically catheterized patient. No fever or white blood cell count elevation. Venous pH is normal. Possible mild DKA. Hyperkalemia, and AKI.  Nursing Notes Reviewed/ Care Coordinated Applicable Imaging Reviewed Interpretation of Laboratory Data incorporated into ED treatment   Plan: Admit   New Prescriptions New Prescriptions   No medications on file     Mancel Bale, MD 05/05/16 1353

## 2016-05-05 NOTE — Progress Notes (Signed)
Inpatient Diabetes Program Recommendations  AACE/ADA: New Consensus Statement on Inpatient Glycemic Control (2015)  Target Ranges:  Prepandial:   less than 140 mg/dL      Peak postprandial:   less than 180 mg/dL (1-2 hours)      Critically ill patients:  140 - 180 mg/dL   Lab Results  Component Value Date   GLUCAP 550 (HH) 05/05/2016   HGBA1C 6.5 04/21/2016   Review of Glycemic Control  Diabetes history: DM 1  Outpatient Diabetes medications: Toujeo 15 units, Novolog 9 units TID with meals Current orders for Inpatient glycemic control: pending  Inpatient Diabetes Program Recommendations:   Patient was diagnosed in 1987, has been on insulin since diagnosis. He sees Dr. Everardo AllEllison (Endocrinology) for DM management and last saw him on 10/3 with no changes with insulin. Patient has a history of hypoglycemia and was refusing to take some of his meal coverage and glucose would increase into the 400's. Labs this morning CO2 16, Anion Gap 16 and Glucose 602. MD to evaluate patient.   Consider DKA protocol IV insulin.  Thanks,  Christena DeemShannon Jossilyn Benda RN, MSN, [facility] Inpatient Diabetes Coordinator Team Pager [phone] (8a-5p)

## 2016-05-05 NOTE — ED Notes (Signed)
Glucose 602.

## 2016-05-05 NOTE — Progress Notes (Signed)
The order for simvastatin(Zocor) was changed to an equivalent dose of atorvastatin(Lipitor) due to the potential drug interaction with Norvasc.  When taken in combination with medications that inhibit its metabolism, simvastatin can accumulate which increases the risk of liver toxicity, myopathy, or rhabdomyolysis.  Simvastatin dose should not exceed 10mg /day in patients taking verapamil, diltiazem, fibrates, or niacin >or= 1g/day.   Simvastatin dose should not exceed 20mg /day in patients taking amlodipine, ranolazine or amiodarone.   Please consider this potential interaction at discharge.  Lorenza EvangelistGreen, Alyss Granato R 05/05/2016 3:36 PM

## 2016-05-06 LAB — BASIC METABOLIC PANEL
Anion gap: 7 (ref 5–15)
BUN: 50 mg/dL — AB (ref 6–20)
CHLORIDE: 109 mmol/L (ref 101–111)
CO2: 22 mmol/L (ref 22–32)
Calcium: 8.8 mg/dL — ABNORMAL LOW (ref 8.9–10.3)
Creatinine, Ser: 2.03 mg/dL — ABNORMAL HIGH (ref 0.61–1.24)
GFR calc Af Amer: 33 mL/min — ABNORMAL LOW (ref >60)
GFR calc non Af Amer: 28 mL/min — ABNORMAL LOW (ref >60)
Glucose, Bld: 192 mg/dL — ABNORMAL HIGH (ref 65–99)
POTASSIUM: 4.6 mmol/L (ref 3.5–5.1)
SODIUM: 138 mmol/L (ref 135–145)

## 2016-05-06 LAB — URINALYSIS, ROUTINE W REFLEX MICROSCOPIC
Bilirubin Urine: NEGATIVE
GLUCOSE, UA: 100 mg/dL — AB
Ketones, ur: NEGATIVE mg/dL
NITRITE: POSITIVE — AB
PH: 6 (ref 5.0–8.0)
Protein, ur: >300 mg/dL — AB
SPECIFIC GRAVITY, URINE: 1.014 (ref 1.005–1.030)

## 2016-05-06 LAB — URINE MICROSCOPIC-ADD ON

## 2016-05-06 LAB — GLUCOSE, CAPILLARY
GLUCOSE-CAPILLARY: 184 mg/dL — AB (ref 65–99)
Glucose-Capillary: 186 mg/dL — ABNORMAL HIGH (ref 65–99)
Glucose-Capillary: 242 mg/dL — ABNORMAL HIGH (ref 65–99)
Glucose-Capillary: 143 mg/dL — ABNORMAL HIGH (ref 65–99)

## 2016-05-06 LAB — MRSA PCR SCREENING: MRSA by PCR: NEGATIVE

## 2016-05-06 NOTE — Progress Notes (Signed)
PROGRESS NOTE  Benjamin Riddle  ZOX:[id number] DOB: 02/09/1928 DOA: 05/05/2016 PCP: Bufford Spikes, DO  Outpatient Specialists: Endocrinology, Dr. 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 Due to concern for hypoglycemia, he had declined several administrations of SS insulin coverage. On arrival he was found to be lethargic without focal deficits. Mentation returned to baseline shortly after arrival. In the ED, labs showed anion gap 16, bicarbonate 14, pH 7.28, glucose 607mg /dl and creatinine around baseline at 2.2. Insulin infusion and IV fluids were started with improvement in labs overnight. Bicarbonate and anion gap have normalized and lantus was started 10/17 with SSI coverage in addition to home insulin regimen. He is without complaints.   Assessment & Plan: Active Problems:   COPD GOLD III    Combined systolic and diastolic congestive heart failure (HCC)   DKA (diabetic ketoacidoses) (HCC)   Bladder outlet obstruction   Memory loss   Chronic kidney disease (CKD), stage IV (severe) (HCC)   Depression  Insulin-dependent diabetes mellitus: With DKA at admission, since resolved. Last Hb A1c down to 6.5% (10/2) from historical baseline of 9-10% (9.2% as recently as 9/25) indicating that there have, in fact, likely been episodes of hypoglycemia, possibly related to diminished po intake. This elderly gentleman's goal A1c is likely < 8%.  - Transitioned to basal/bolus insulin 10/17 PM, will continue this - Carb-modified diet, D/C IVF - Recheck CBC, BMP in AM - Consider discharging on reduced insulin (toujeo 18u + novolog 10u, 5u, 10u TIDAC + correction at home). Balance currently more on mealtime coverage appropriate with decreased po intake.   Abnormal urinalysis: Recurrent problem in absence of UTI signs/symptoms. Add-on urine culture and treat only if positive. No  SIRS. - Repeat UA and culture with empiric coverage for urinary pathogens if febrile.   Urinary retention/chronic Foley - Foley catheter changed in ED  Failure to thrive: Progressive weakness, wheelchair-dependent by history, worsening with decreased po.  - Nutrition consult - PT eval  Dyslipidemia: Chronic, stable - Continue statin  Hypertension: Chronic stable. Intermittently SBP > 140.  - Continue home medications - Consider increase norvasc to 10mg  if BPs remain elevated  CKD stage IV: Baseline creatinine ~2.2, without AKI on admission.  - Avoid nephrotoxins - Follow up BMP in AM  Hip pain: Chronic, most likely due to osteoarthritis, no acute finding on x-ray - Tylenol, tramadol, robaxin prn - Avoid NSAIDs given CKD - Continue vitamin D supplementation  COPD: No wheezing, respiratory distress to suggest exacerbation. - Continue home dulera and albuterol prn  DVT prophylaxis: Heparin Code Status: Full, per discussion at admission (had DNR from facility) Family Communication: Wife at bedside at admission Disposition Plan: Transfer to [facility]. PT eval, likely D/C to SNF in 24-48 hours  Consultants:   Nutrition  Procedures:   None  Antimicrobials:  None   Subjective: Pt feels well. Ate breakfast without issues. No abd pain, N/V/D, chest pain, dyspnea.   Objective: Vitals:   05/06/16 0600 05/06/16 0743 05/06/16 0834 05/06/16 0904  BP: (!) 159/82   (!) 145/77  Pulse: 74     Resp: 17     Temp:  98.6 F (37 C)    TempSrc:  Oral    SpO2: 96%  99%   Weight:      Height:        Intake/Output Summary (Last 24 hours) at 05/06/16 1135 Last data  filed at 05/06/16 1043  Gross per 24 hour  Intake          1038.75 ml  Output             2775 ml  Net         -1736.25 ml   Filed Weights   05/05/16 0637 05/05/16 1611  Weight: 61.2 kg (135 lb) 59 kg (130 lb)    Examination: General exam: Frail elderly male in no distress Respiratory system:  Non-labored breathing. Clear to auscultation bilaterally.  Cardiovascular system: Regular rate and rhythm. No murmur, rub, or gallop. No JVD, and no pedal edema. Gastrointestinal system: Abdomen thin, soft, non-tender, non-distended, with normoactive bowel sounds. No organomegaly or masses felt. Central nervous system: Alert and oriented. No focal neurological deficits. Extremities: Warm, no deformities, diffusely diminished muscle bulk Skin: No rashes, lesions, ulcers Psychiatry: Judgement and insight appear normal. Mood & affect appropriate.   Data Reviewed: I have personally reviewed following labs and imaging studies  CBC:  Recent Labs Lab 05/05/16 0650 05/05/16 1612  WBC 7.0 6.9  HGB 11.0* 11.6*  HCT 35.3* 35.5*  MCV 93.4 90.3  PLT 403* 432*   Basic Metabolic Panel:  Recent Labs Lab 05/05/16 0650 05/05/16 0931 05/05/16 1612 05/05/16 2033 05/06/16 0003  NA 133* 134* 140 140 138  K 5.2* 5.1 4.0 4.3 4.6  CL 101 104 109 109 109  CO2 16* 14* 23 24 22   GLUCOSE 602* 607* 163* 142* 192*  BUN 51* 51* 49* 48* 50*  CREATININE 2.24* 2.23* 2.01* 2.02* 2.03*  CALCIUM 8.8* 8.6* 9.3 9.1 8.8*   GFR: Estimated Creatinine Clearance: 22.2 mL/min (by C-G formula based on SCr of 2.03 mg/dL (H)). Liver Function Tests: No results for input(s): AST, ALT, ALKPHOS, BILITOT, PROT, ALBUMIN in the last 168 hours. No results for input(s): LIPASE, AMYLASE in the last 168 hours. No results for input(s): AMMONIA in the last 168 hours. Coagulation Profile: No results for input(s): INR, PROTIME in the last 168 hours. Cardiac Enzymes: No results for input(s): CKTOTAL, CKMB, CKMBINDEX, TROPONINI in the last 168 hours. BNP (last 3 results) No results for input(s): PROBNP in the last 8760 hours. HbA1C: No results for input(s): HGBA1C in the last 72 hours. CBG:  Recent Labs Lab 05/05/16 1440 05/05/16 1556 05/05/16 1704 05/05/16 2219 05/06/16 0749  GLUCAP 249* 167* 128* 116* 184*   Lipid  Profile: No results for input(s): CHOL, HDL, LDLCALC, TRIG, CHOLHDL, LDLDIRECT in the last 72 hours. Thyroid Function Tests: No results for input(s): TSH, T4TOTAL, FREET4, T3FREE, THYROIDAB in the last 72 hours. Anemia Panel: No results for input(s): VITAMINB12, FOLATE, FERRITIN, TIBC, IRON, RETICCTPCT in the last 72 hours. Urine analysis:    Component Value Date/Time   COLORURINE YELLOW 05/05/2016 0748   APPEARANCEUR TURBID (A) 05/05/2016 0748   LABSPEC 1.013 05/05/2016 0748   PHURINE 6.0 05/05/2016 0748   GLUCOSEU >1000 (A) 05/05/2016 0748   HGBUR SMALL (A) 05/05/2016 0748   BILIRUBINUR NEGATIVE 05/05/2016 0748   KETONESUR 40 (A) 05/05/2016 0748   PROTEINUR 30 (A) 05/05/2016 0748   UROBILINOGEN 0.2 04/06/2015 2033   NITRITE NEGATIVE 05/05/2016 0748   LEUKOCYTESUR LARGE (A) 05/05/2016 0748   Sepsis Labs: @LABRCNTIP (procalcitonin:4,lacticidven:4)  ) Recent Results (from the past 240 hour(s))  MRSA PCR Screening     Status: None   Collection Time: 05/05/16  7:20 PM  Result Value Ref Range Status   MRSA by PCR NEGATIVE NEGATIVE Final    Comment:  The GeneXpert MRSA Assay (FDA approved for NASAL specimens only), is one component of a comprehensive MRSA colonization surveillance program. It is not intended to diagnose MRSA infection nor to guide or monitor treatment for MRSA infections.      Radiology Studies: Dg Chest 2 View  Result Date: 05/05/2016 CLINICAL DATA:  Hyperglycemia.  Hypertension. EXAM: CHEST  2 VIEW COMPARISON:  April 10, 2016 FINDINGS: There is a small right pleural effusion. There is no evident edema or consolidation. Heart size and pulmonary vascularity are normal. There is atherosclerotic calcification in the aorta. There is degenerative change in the lower thoracic spine. IMPRESSION: Persistent small right pleural effusion. No edema or consolidation. Stable cardiac silhouette. There is aortic atherosclerosis. Electronically Signed   By:  Bretta Bang III M.D.   On: 05/05/2016 09:14   Dg Hip Unilat With Pelvis 2-3 Views Left  Result Date: 05/05/2016 CLINICAL DATA:  Left hip pain for 1 week, no known injury, initial encounter EXAM: DG HIP (WITH OR WITHOUT PELVIS) 3V LEFT COMPARISON:  None. FINDINGS: No acute fracture or dislocation is seen. Mild degenerative changes of the hip joints are noted bilaterally. No soft tissue abnormality is seen. IMPRESSION: No acute abnormality noted. Electronically Signed   By: Alcide Clever M.D.   On: 05/05/2016 13:19    Scheduled Meds: . amLODipine  5 mg Oral Daily  . atorvastatin  20 mg Oral q1800  . cholecalciferol  1,000 Units Oral Daily  . heparin  5,000 Units Subcutaneous Q8H  . insulin aspart  0-9 Units Subcutaneous TID WC  . insulin aspart  10 Units Subcutaneous Q breakfast  . insulin aspart  10 Units Subcutaneous Q supper  . insulin aspart  5 Units Subcutaneous Q lunch  . insulin glargine  14 Units Subcutaneous QHS  . mometasone-formoterol  2 puff Inhalation BID  . polyethylene glycol  17 g Oral Daily  . saccharomyces boulardii  250 mg Oral BID  . sertraline  50 mg Oral QHS   Continuous Infusions:    LOS: 1 day   Time spent: 25 minutes.  Hazeline Junker, MD [facility] Pager [phone]  If 7PM-7AM, please contact night-coverage [email or web address] Password [email or web address] 05/06/2016, 11:35 AM

## 2016-05-06 NOTE — Clinical Social Work Note (Signed)
Clinical Social Work Assessment  Patient Details  Name: Benjamin Riddle MRN: [id number] Date of Birth: 03-25-1928  Date of referral:  05/06/16               Reason for consult:  Facility Placement, Discharge Planning                Permission sought to share information with:  Facility Art therapist granted to share information::  Yes, Verbal Permission Granted  Name::        Agency::     Relationship::     Contact Information:     Housing/Transportation Living arrangements for the past 2 months:  [place] of Information:  Patient Patient Interpreter Needed:  None Criminal Activity/Legal Involvement Pertinent to Current Situation/Hospitalization:  No - Comment as needed Significant Relationships:  Spouse Lives with:  Facility Resident Do you feel safe going back to the place where you live?  Yes Need for family participation in patient care:  Yes (Comment)  Care giving concerns:  No concerns reported.   Social Worker assessment / plan:  Pt hospitalized on 05/05/16 from [facility] ( SNF ) with DKA. CSW met with pt to assist with d/c planning. Pt plans to return to [facility] at d/c. CSW has contacted SNF and clinicals have been sent for review. [facility] will readmit when pt is stable for d/c. CSW will continue to follow to assist with d/c planning needs.  Employment status:  Retired Nurse, adult PT Recommendations:  [place] / Referral to community resources:     Patient/Family's Response to care:  Pt plans to return to [facility] ( SNF ) at d/c.  Patient/Family's Understanding of and Emotional Response to Diagnosis, Current Treatment, and Prognosis:  Pt is aware of his medical status. " I'm feeling better today ". Pt is aware he will be moving to a regular floor today. Pt is looking forward to returning to [facility] at d/c.  Emotional Assessment Appearance:  Appears stated  age Attitude/Demeanor/Rapport:  Other (cooperative) Affect (typically observed):  Appropriate, Calm, Pleasant Orientation:  Oriented to Self, Oriented to Place, Oriented to  Time, Oriented to Situation Alcohol / Substance use:  Not Applicable Psych involvement (Current and /or in the community):  No (Comment)  Discharge Needs  Concerns to be addressed:  Discharge Planning Concerns Readmission within the last 30 days:  No Current discharge risk:  None Barriers to Discharge:  No Barriers Identified   Luretha Rued, [place] 05/06/2016, 1:18 PM

## 2016-05-06 NOTE — Care Management Note (Signed)
Case Management Note  Patient Details  Name: Benjamin Riddle MRN: [id number] Date of Birth: 1928/05/09  Subjective/Objective:          dka          Action/Plan: home   Expected Discharge Date:  05/08/16               Expected Discharge Plan:  Home/Self Care  In-House Referral:     Discharge planning Services     Post Acute Care Choice:    Choice offered to:     DME Arranged:    DME Agency:     HH Arranged:    HH Agency:     Status of Service:  In process, will continue to follow  If discussed at Long Length of Stay Meetings, dates discussed:    Additional Comments: Date:  May 06, 2016 Chart reviewed for concurrent status and case management needs. Will continue to follow the patient for status change: Discharge Planning: following for needs Expected discharge date: [id number] Marcelle SmilingRhonda Davis, BSN, [place] Lake[place]3, [place]   [phone] Benjamin Riddle, Benjamin Lynn, Benjamin Riddle 05/06/2016, 8:59 AM

## 2016-05-06 NOTE — NC FL2 (Signed)
[place] MEDICAID FL2 LEVEL OF CARE SCREENING TOOL     IDENTIFICATION  Patient Name: Benjamin Riddle Birthdate: 1927-12-27 Sex: male Admission Date (Current Location): 05/05/2016  [facility] and [place] Number:  Producer, television/film/video and Address:  [facility],  [address] [place] [address]      Provider Number: [id number]  Attending Physician Name and Address:  Tyrone Nine, MD  Relative Name and Phone Number:       Current Level of Care: Hospital Recommended Level of Care: Skilled Nursing Facility Prior Approval Number:    Date Approved/Denied:   PASRR Number:    Discharge Plan: SNF    Current Diagnoses: Patient Active Problem List   Diagnosis Date Noted  . HCAP (healthcare-associated pneumonia) 04/06/2016  . Depression 04/06/2016  . Chronic kidney disease (CKD), stage IV (severe) (HCC) 03/30/2016  . Memory loss 03/12/2016  . Diabetes mellitus type 1, uncontrolled (HCC) 03/12/2016  . UTI (lower urinary tract infection) 01/27/2016  . Type 1 diabetes mellitus with ketoacidosis, uncontrolled (HCC) 01/19/2016  . Fall   . Bladder outlet obstruction 04/06/2015  . Protein-calorie malnutrition, severe (HCC) 01/25/2015  . DKA (diabetic ketoacidoses) (HCC) 01/23/2015  . Combined systolic and diastolic congestive heart failure (HCC)   . COPD GOLD III  11/23/2014  . Asthmatic bronchitis , chronic (HCC) 08/31/2014  . HLD (hyperlipidemia) 12/09/2009  . HTN (hypertension) 12/09/2009    Orientation RESPIRATION BLADDER Height & Weight     Self, Time, Situation, Place  Normal Indwelling catheter Weight: 130 lb (59 kg) Height:  5\' 8"  (172.7 cm)  BEHAVIORAL SYMPTOMS/MOOD NEUROLOGICAL BOWEL NUTRITION STATUS  Other (Comment) (no behaviors)   Incontinent Diet  AMBULATORY STATUS COMMUNICATION OF NEEDS Skin   Limited Assist Verbally Normal                       Personal Care Assistance Level of Assistance  Bathing, Feeding, Dressing Bathing Assistance:  Limited assistance Feeding assistance: Independent Dressing Assistance: Limited assistance     Functional Limitations Info  Sight, Hearing, Speech Sight Info: Adequate Hearing Info: Adequate Speech Info: Adequate    SPECIAL CARE FACTORS FREQUENCY  PT (By licensed PT)     PT Frequency: 5x wk              Contractures Contractures Info: Not present    Additional Factors Info  Insulin Sliding Scale               Current Medications (05/06/2016):  This is the current hospital active medication list Current Facility-Administered Medications  Medication Dose Route Frequency Provider Last Rate Last Dose  . acetaminophen (TYLENOL) tablet 500 mg  500 mg Oral Q8H PRN Leana Roe Elgergawy, MD      . albuterol (PROVENTIL) (2.5 MG/3ML) 0.083% nebulizer solution 2.5 mg  2.5 mg Nebulization Q6H PRN Leana Roe Elgergawy, MD      . amLODipine (NORVASC) tablet 5 mg  5 mg Oral Daily Starleen Arms, MD   5 mg at 05/06/16 0904  . atorvastatin (LIPITOR) tablet 20 mg  20 mg Oral q1800 Starleen Arms, MD   20 mg at 05/05/16 1811  . bismuth subsalicylate (PEPTO BISMOL) chewable tablet 524 mg  524 mg Oral PRN Starleen Arms, MD      . cholecalciferol (VITAMIN D) tablet 1,000 Units  1,000 Units Oral Daily Starleen Arms, MD   1,000 Units at 05/06/16 0903  . heparin injection 5,000 Units  5,000 Units  Subcutaneous Q8H Starleen Armsawood S Elgergawy, MD   5,000 Units at 05/06/16 0557  . insulin aspart (novoLOG) injection 0-9 Units  0-9 Units Subcutaneous TID WC Starleen Armsawood S Elgergawy, MD   2 Units at 05/06/16 1220  . insulin aspart (novoLOG) injection 10 Units  10 Units Subcutaneous Q breakfast Aleda GranaDustin G Zeigler, RPH   10 Units at 05/06/16 [id number]  . insulin aspart (novoLOG) injection 10 Units  10 Units Subcutaneous Q supper Aleda GranaDustin G Zeigler, RPH   10 Units at 05/05/16 1847  . insulin aspart (novoLOG) injection 5 Units  5 Units Subcutaneous Q lunch Aleda GranaDustin G Zeigler, RPH   5 Units at 05/06/16 1221  . insulin  glargine (LANTUS) injection 14 Units  14 Units Subcutaneous QHS Starleen Armsawood S Elgergawy, MD   14 Units at 05/05/16 1745  . methocarbamol (ROBAXIN) tablet 500 mg  500 mg Oral Q8H PRN Starleen Armsawood S Elgergawy, MD      . mometasone-formoterol (DULERA) 100-5 MCG/ACT inhaler 2 puff  2 puff Inhalation BID Starleen Armsawood S Elgergawy, MD   2 puff at 05/06/16 0834  . polyethylene glycol (MIRALAX / GLYCOLAX) packet 17 g  17 g Oral Daily Starleen Armsawood S Elgergawy, MD   17 g at 05/06/16 0903  . saccharomyces boulardii (FLORASTOR) capsule 250 mg  250 mg Oral BID Starleen Armsawood S Elgergawy, MD   250 mg at 05/06/16 0902  . sertraline (ZOLOFT) tablet 50 mg  50 mg Oral QHS Starleen Armsawood S Elgergawy, MD   50 mg at 05/05/16 2155  . traMADol (ULTRAM) tablet 50 mg  50 mg Oral Q6H PRN Starleen Armsawood S Elgergawy, MD         Discharge Medications: Please see discharge summary for a list of discharge medications.  Relevant Imaging Results:  Relevant Lab Results:   Additional Information SS #  [id number]  Errol Ala, Dickey GaveJamie Lee, LCSW

## 2016-05-07 LAB — BASIC METABOLIC PANEL
Anion gap: 7 (ref 5–15)
BUN: 39 mg/dL — ABNORMAL HIGH (ref 6–20)
CHLORIDE: 109 mmol/L (ref 101–111)
CO2: 24 mmol/L (ref 22–32)
Calcium: 9.3 mg/dL (ref 8.9–10.3)
Creatinine, Ser: 1.7 mg/dL — ABNORMAL HIGH (ref 0.61–1.24)
GFR calc Af Amer: 41 mL/min — ABNORMAL LOW (ref >60)
GFR, EST NON AFRICAN AMERICAN: 35 mL/min — AB (ref >60)
Glucose, Bld: 83 mg/dL (ref 65–99)
Potassium: 4.3 mmol/L (ref 3.5–5.1)
SODIUM: 140 mmol/L (ref 135–145)

## 2016-05-07 LAB — GLUCOSE, CAPILLARY
GLUCOSE-CAPILLARY: 90 mg/dL (ref 65–99)
GLUCOSE-CAPILLARY: 325 mg/dL — AB (ref 65–99)
GLUCOSE-CAPILLARY: 268 mg/dL — AB (ref 65–99)

## 2016-05-07 LAB — CBC
HEMATOCRIT: 34.5 % — AB (ref 39.0–52.0)
HEMOGLOBIN: 11.2 g/dL — AB (ref 13.0–17.0)
MCH: 29.4 pg (ref 26.0–34.0)
MCHC: 32.5 g/dL (ref 30.0–36.0)
MCV: 90.6 fL (ref 78.0–100.0)
Platelets: 425 10*3/uL — ABNORMAL HIGH (ref 150–400)
RBC: 3.81 MIL/uL — AB (ref 4.22–5.81)
RDW: 13.4 % (ref 11.5–15.5)
WBC: 6.5 10*3/uL (ref 4.0–10.5)

## 2016-05-07 MED ORDER — GLUCERNA SHAKE PO LIQD
237.0000 mL | Freq: Two times a day (BID) | ORAL | Status: DC
  Filled 2016-05-07 (×2): qty 237

## 2016-05-07 MED ORDER — GLUCERNA SHAKE PO LIQD: 237.0000 mL | Freq: Two times a day (BID) | ORAL | 0 refills | Status: AC

## 2016-05-07 NOTE — Discharge Summary (Addendum)
Physician Discharge Summary  Lester Crickenberger ZOX:[id number] DOB: [birth year] DOA: 05/05/2016  PCP: Bufford Spikes, DO  Admit date: 05/05/2016 Discharge date: 05/07/2016  Admitted From: SNF Disposition: SNF   Recommendations for Outpatient Follow-up:  1. Follow up with PCP in 1-2 weeks 2. Please provide glucerna twice daily, as recommended by our nutrition consultant. 3. Follow up with endocrinology as soon as possible. Pt admitted with DKA due to declining insulin. 4. Consider repeat UA with new foley insertion. Urinalysis suggestive of infection vs. colonization without symptoms or signs of infection. Urine culture NGTD at discharge.  5. Monitor BP.  6. Please order BMP to monitor renal function at follow up.   Home Health: Per SNF Equipment/Devices: Per SNF. Foley replaced 10/18.   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 Due to concern for hypoglycemia, he had declined several administrations of SS insulin coverage. On arrival he was found to be lethargic without focal deficits. Mentation returned to baseline shortly after arrival. In the ED, labs showed anion gap 16, bicarbonate 14, pH 7.28, glucose 607mg /dl and creatinine around baseline at 2.2. Insulin infusion and IV fluids were started with improvement in labs overnight. Bicarbonate and anion gap have normalized and lantus was started 10/17 with SSI coverage in addition to home insulin regimen. He is without complaints.   Discharge Diagnoses:  Active Problems:   COPD GOLD III    Combined systolic and diastolic congestive heart failure (HCC)   DKA (diabetic ketoacidoses) (HCC)   Bladder outlet obstruction   Memory loss   Chronic kidney disease (CKD), stage IV (severe) (HCC)    Depression  Insulin-dependent type 1 diabetes mellitus: With DKA at admission, since resolved. Last Hb A1c down to 6.5% (10/2) from historical baseline of 9-10% (9.2% as recently as 9/25) indicating that there have, in fact, likely been episodes of hypoglycemia, possibly related to diminished po intake. This elderly gentleman's goal A1c is likely < 8%.  - Transitioned to basal/bolus insulin 10/17 PM, will continue this - Carb-modified diet - Follow up with endocrinology. Urged to avoid insulin if not eating. Will likely require reduced insulin dose, but with admission for DKA, discharging on reduced insulin is not recommended. - Consider discharging on reduced insulin (toujeo 18u + novolog 10u, 5u, 10u TIDAC + correction at home). Balance currently more on mealtime coverage appropriate with decreased po intake.   Abnormal urinalysis: Recurrent problem in absence of UTI signs/symptoms. No SIRS. - Repeat UA and culture with empiric coverage for urinary pathogens if febrile. Pt has allergies to several abx.   Urinary retention/chronic Foley - Foley catheter changed 10/18.   Chronic systolic and diastolic CHF without acute exacerbation:   Failure to thrive: Progressive weakness, wheelchair-dependent by history, worsening with decreased po.  - Nutrition consult: Recommends glucerna BID instead of just prn. - PT eval  Dyslipidemia: Chronic, stable - Continue statin  Hypertension: Chronic stable. Intermittently SBP > 140.  - Continue home medications - Consider increase norvasc to 10mg  if BPs remain elevated  CKD stage IV: Baseline creatinine ~2.2, without AKI on admission.  - Avoid nephrotoxins - Follow up BMP in AM  Hip pain: Chronic, most likely due to osteoarthritis, no acute finding on x-ray - Tylenol, tramadol, robaxin prn - Avoid NSAIDs given CKD - Continue vitamin D supplementation  COPD: No wheezing, respiratory  distress to suggest exacerbation. - Continue home dulera  and albuterol prn  Discharge Instructions Discharge Instructions    Discharge instructions    Complete by:  As directed    You were admitted for DKA and have improved, having returned to nearly your normal basal and bolus insulin regimen. You will be discharged today with the following recommendations:  - Continue insulin as directed by your endocrinologist. It is important to take insulin if you eat and NOT take insulin if you do not eat.  - Follow up with endocrinology as soon as possible.   It was a pleasure getting to know you. Take care! - Dr. Jarvis Newcomer       Medication List    TAKE these medications   acetaminophen 500 MG tablet Commonly known as:  TYLENOL Take 500 mg by mouth every 8 (eight) hours as needed for mild pain, moderate pain or fever.   albuterol 108 (90 Base) MCG/ACT inhaler Commonly known as:  PROVENTIL HFA;VENTOLIN HFA Inhale 2 puffs into the lungs every 4 (four) hours as needed for wheezing or shortness of breath.   albuterol (2.5 MG/3ML) 0.083% nebulizer solution Commonly known as:  PROVENTIL Take 2.5 mg by nebulization every 6 (six) hours as needed for wheezing or shortness of breath.   amLODipine 5 MG tablet Commonly known as:  NORVASC Take 5 mg by mouth daily.   BIOFREEZE EX Apply 1 application topically 4 (four) times daily as needed.   bismuth subsalicylate 262 MG chewable tablet Commonly known as:  PEPTO BISMOL Chew 524 mg by mouth as needed for indigestion.   cephALEXin 250 MG capsule Commonly known as:  KEFLEX Take 250 mg by mouth every morning.   cholecalciferol 1000 units tablet Commonly known as:  VITAMIN D Take 1,000 Units by mouth daily.   DULERA 100-5 MCG/ACT Aero Generic drug:  mometasone-formoterol Inhale 2 puffs into the lungs 2 (two) times daily.   feeding supplement (GLUCERNA SHAKE) Liqd Take 237 mLs by mouth daily as needed (nutritional supplement). What changed:  Another medication with the same name was added. Make sure  you understand how and when to take each.   feeding supplement (GLUCERNA SHAKE) Liqd Take 237 mLs by mouth 2 (two) times daily between meals. What changed:  You were already taking a medication with the same name, and this prescription was added. Make sure you understand how and when to take each.   insulin aspart 100 UNIT/ML injection Commonly known as:  novoLOG Inject 5-10 Units into the skin 3 (three) times daily before meals. 5 units at lunch, 10 units at breakfast and supper. Give 1 units for every 1 unit over 150.   methocarbamol 500 MG tablet Commonly known as:  ROBAXIN Take 500 mg by mouth every 8 (eight) hours as needed for muscle spasms.   polyethylene glycol packet Commonly known as:  MIRALAX / GLYCOLAX Take 17 g by mouth daily.   saccharomyces boulardii 250 MG capsule Commonly known as:  FLORASTOR Take 250 mg by mouth 2 (two) times daily.   sertraline 50 MG tablet Commonly known as:  ZOLOFT Take 50 mg by mouth at bedtime.   simvastatin 40 MG tablet Commonly known as:  ZOCOR Take 40 mg by mouth daily.   TOUJEO SOLOSTAR 300 UNIT/ML Sopn Generic drug:  Insulin Glargine Inject 18 Units into the skin at bedtime.   traMADol 50 MG tablet Commonly known as:  ULTRAM Take 50 mg by mouth every 6 (six) hours as needed for severe  pain.      Follow-up Information    REED, TIFFANY, DO .   Specialty:  Geriatric Medicine Contact information: [address] [place] [address] [phone]        Romero Belling, MD .   Specialty:  Endocrinology Contact information: [address] [place] [address] [phone]          Allergies  Allergen Reactions  . Aspirin Other (See Comments)    Per patient states causes him to bleed easily   . Bactrim [Sulfamethoxazole-Trimethoprim] Nausea And Vomiting  . Codeine Nausea And Vomiting  . Gentamicin Other (See Comments)    Becomes dizzy and weak  . Lexapro [Escitalopram Oxalate] Other (See Comments)     Unknown to patient  . Protonix [Pantoprazole Sodium] Diarrhea  . Ciprofloxacin Itching and Rash    Consultations:  None  Procedures/Studies: Dg Chest 2 View  Result Date: 05/05/2016 CLINICAL DATA:  Hyperglycemia.  Hypertension. EXAM: CHEST  2 VIEW COMPARISON:  April 10, 2016 FINDINGS: There is a small right pleural effusion. There is no evident edema or consolidation. Heart size and pulmonary vascularity are normal. There is atherosclerotic calcification in the aorta. There is degenerative change in the lower thoracic spine. IMPRESSION: Persistent small right pleural effusion. No edema or consolidation. Stable cardiac silhouette. There is aortic atherosclerosis. Electronically Signed   By: Bretta Bang III M.D.   On: 05/05/2016 09:14   Dg Chest Port 1 View  Result Date: 04/10/2016 CLINICAL DATA:  Post right thoracentesis. EXAM: PORTABLE CHEST 1 VIEW COMPARISON:  Chest x-ray 01/27/2016, 12/16/2015 and earlier. FINDINGS: Residual small right pleural effusion. No evidence of right pneumothorax after thoracentesis. Associated consolidation in the right lower lobe and right middle lobe. Left lung clear. No visible left pleural effusion. Cardiac silhouette normal in size, unchanged. Thoracic aorta atherosclerotic, unchanged. IMPRESSION: 1. No pneumothorax after right thoracentesis. 2. Residual small right pleural effusion. 3. Associated passive atelectasis and/or pneumonia involving the right lower lobe and right middle lobe. Electronically Signed   By: Hulan Saas M.D.   On: 04/10/2016 14:54   Dg Hip Unilat With Pelvis 2-3 Views Left  Result Date: 05/05/2016 CLINICAL DATA:  Left hip pain for 1 week, no known injury, initial encounter EXAM: DG HIP (WITH OR WITHOUT PELVIS) 3V LEFT COMPARISON:  None. FINDINGS: No acute fracture or dislocation is seen. Mild degenerative changes of the hip joints are noted bilaterally. No soft tissue abnormality is seen. IMPRESSION: No acute abnormality  noted. Electronically Signed   By: Alcide Clever M.D.   On: 05/05/2016 13:19   US Thoracentesis Asp Pleural Space W/img Guide  Result Date: 04/10/2016 INDICATION: COPD, CHF, chronic kidney disease, weakness, right pleural effusion. Request made for diagnostic and therapeutic right thoracentesis. EXAM: ULTRASOUND GUIDED DIAGNOSTIC AND THERAPEUTIC RIGHT  THORACENTESIS MEDICATIONS: None. COMPLICATIONS: None immediate. PROCEDURE: An ultrasound guided thoracentesis was thoroughly discussed with the patient and questions answered. The benefits, risks, alternatives and complications were also discussed. The patient understands and wishes to proceed with the procedure. Written consent was obtained. Ultrasound was performed to localize and mark an adequate pocket of fluid in the right chest. The area was then prepped and draped in the normal sterile fashion. 1% Lidocaine was used for local anesthesia. Under ultrasound guidance a Safe-T-Centesis catheter was introduced. Thoracentesis was performed. The catheter was removed and a dressing applied. FINDINGS: A total of approximately 1.4 liters of slightly hazy, amber fluid was removed. Samples were sent to the laboratory as  requested by the clinical team. IMPRESSION: Successful ultrasound guided diagnostic and therapeutic right thoracentesis yielding 1.4 liters of pleural fluid. Read by: Jeananne RamaKevin Allred, PA-C Electronically Signed   By: Corlis Leak  Hassell M.D.   On: 04/10/2016 14:55     Subjective: Pt without complaint. No fever, chills, night sweats, N/V/D, chest pain, dyspnea, palpitations. Wants to go home.  Discharge Exam: Vitals:   05/06/16 2112 05/07/16 0609  BP: 123/75 140/75  Pulse: 70 68  Resp: 16 16  Temp: 98.2 F (36.8 C) 98.1 F (36.7 C)   Vitals:   05/06/16 1434 05/06/16 2112 05/07/16 0609 05/07/16 0735  BP:  123/75 140/75   Pulse:  70 68   Resp:  16 16   Temp:  98.2 F (36.8 C) 98.1 F (36.7 C)   TempSrc:  Oral Oral   SpO2:  98% 97% 97%  Weight:  59 kg (130 lb)     Height: 5\' 8"  (1.727 m)      General: Frail, elderly male in no acute distress Cardiovascular: RRR, S1/S2 +, no rubs, no gallops Respiratory: CTA bilaterally, no wheezing, no rhonchi Abdominal: Soft, NT, ND, bowel sounds + Extremities: no edema, no cyanosis  The results of significant diagnostics from this hospitalization (including imaging, microbiology, ancillary and laboratory) are listed below for reference.    Microbiology: Recent Results (from the past 240 hour(s))  MRSA PCR Screening     Status: None   Collection Time: 05/05/16  7:20 PM  Result Value Ref Range Status   MRSA by PCR NEGATIVE NEGATIVE Final    Comment:        The GeneXpert MRSA Assay (FDA approved for NASAL specimens only), is one component of a comprehensive MRSA colonization surveillance program. It is not intended to diagnose MRSA infection nor to guide or monitor treatment for MRSA infections.      Labs: BNP (last 3 results) No results for input(s): BNP in the last 8760 hours. Basic Metabolic Panel:  Recent Labs Lab 05/05/16 0931 05/05/16 1612 05/05/16 2033 05/06/16 0003 05/07/16 0443  NA 134* 140 140 138 140  K 5.1 4.0 4.3 4.6 4.3  CL 104 109 109 109 109  CO2 14* 23 24 22 24   GLUCOSE 607* 163* 142* 192* 83  BUN 51* 49* 48* 50* 39*  CREATININE 2.23* 2.01* 2.02* 2.03* 1.70*  CALCIUM 8.6* 9.3 9.1 8.8* 9.3   Liver Function Tests: No results for input(s): AST, ALT, ALKPHOS, BILITOT, PROT, ALBUMIN in the last 168 hours. No results for input(s): LIPASE, AMYLASE in the last 168 hours. No results for input(s): AMMONIA in the last 168 hours. CBC:  Recent Labs Lab 05/05/16 0650 05/05/16 1612 05/07/16 0443  WBC 7.0 6.9 6.5  HGB 11.0* 11.6* 11.2*  HCT 35.3* 35.5* 34.5*  MCV 93.4 90.3 90.6  PLT 403* 432* 425*   Cardiac Enzymes: No results for input(s): CKTOTAL, CKMB, CKMBINDEX, TROPONINI in the last 168 hours. BNP: Invalid input(s): POCBNP CBG:  Recent Labs Lab  05/06/16 0749 05/06/16 1205 05/06/16 1725 05/06/16 2114 05/07/16 0801  GLUCAP 184* 186* 242* 143* 90   D-Dimer No results for input(s): DDIMER in the last 72 hours. Hgb A1c No results for input(s): HGBA1C in the last 72 hours. Lipid Profile No results for input(s): CHOL, HDL, LDLCALC, TRIG, CHOLHDL, LDLDIRECT in the last 72 hours. Thyroid function studies No results for input(s): TSH, T4TOTAL, T3FREE, THYROIDAB in the last 72 hours.  Invalid input(s): FREET3 Anemia work up No results for input(s): VITAMINB12, FOLATE,  FERRITIN, TIBC, IRON, RETICCTPCT in the last 72 hours. Urinalysis    Component Value Date/Time   COLORURINE YELLOW 05/06/2016 1321   APPEARANCEUR TURBID (A) 05/06/2016 1321   LABSPEC 1.014 05/06/2016 1321   PHURINE 6.0 05/06/2016 1321   GLUCOSEU 100 (A) 05/06/2016 1321   HGBUR LARGE (A) 05/06/2016 1321   BILIRUBINUR NEGATIVE 05/06/2016 1321   KETONESUR NEGATIVE 05/06/2016 1321   PROTEINUR >300 (A) 05/06/2016 1321   UROBILINOGEN 0.2 04/06/2015 2033   NITRITE POSITIVE (A) 05/06/2016 1321   LEUKOCYTESUR LARGE (A) 05/06/2016 1321   Sepsis Labs Invalid input(s): PROCALCITONIN,  WBC,  LACTICIDVEN Microbiology Recent Results (from the past 240 hour(s))  MRSA PCR Screening     Status: None   Collection Time: 05/05/16  7:20 PM  Result Value Ref Range Status   MRSA by PCR NEGATIVE NEGATIVE Final    Comment:        The GeneXpert MRSA Assay (FDA approved for NASAL specimens only), is one component of a comprehensive MRSA colonization surveillance program. It is not intended to diagnose MRSA infection nor to guide or monitor treatment for MRSA infections.     Time coordinating discharge: Over 30 minutes  Hazeline Junker, MD  [facility] 05/07/2016, 11:22 AM Pager [phone]  If 7PM-7AM, please contact night-coverage [email or web address] Password [email or web address]

## 2016-05-07 NOTE — Progress Notes (Signed)
Initial Nutrition Assessment  DOCUMENTATION CODES:   Severe malnutrition in context of chronic illness  INTERVENTION:   Provide Glucerna Shake po BID, each supplement provides 220 kcal and 10 grams of protein Encourage PO intake RD to continue to monitor for additional needs  NUTRITION DIAGNOSIS:   Malnutrition related to chronic illness, catabolic illness as evidenced by severe depletion of body fat, severe depletion of muscle mass.  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 Due to concern for hypoglycemia, he had declined several administrations of SS insulin coverage. On arrival he was found to be lethargic without focal deficits. Mentation returned to baseline shortly after arrival.  Patient in room with wife at bedside. Per pt's wife, pt eats very well at facility he is from. The facility has him on a carbohydrate modified diet and he eats 3 meals a day with 1 Glucerna shake daily. PO intake have ranged from 75-80% here. Pt denies swallowing or chewing issues. Pt's wife states his blood sugars are hard to control but it is not diet related, more associated with infections. Recommend increase Glucerna shakes to BID. Pt's wife is agreeable to pt receiving a Glucerna supplement this morning prior to lunch. She states he is going back to his facility today. Pt and pt's wife have no questions for RD at this time.  Per chart review, pt has gained some weight and is now at the weight he was a year ago. Nutrition-Focused physical exam completed. Findings are severe fat depletion, severe muscle depletion, and no edema.   Medications: Vitamin D tablet daily, Miralax packet  daily, Florastor capsule BID  Labs reviewed: CBGs: 90-242  Diet Order:  Diet Carb Modified Fluid consistency: Thin; Room service appropriate? Yes  Skin:  Wound (see comment) (sacral blisters)  Last BM:  10/18  Height:   Ht Readings from Last 1 Encounters:  05/06/16 5\' 8"  (1.727 m)    Weight:   Wt Readings from Last 1 Encounters:  05/06/16 130 lb (59 kg)    Ideal Body Weight:  70 kg  BMI:  Body mass index is 19.77 kg/m.  Estimated Nutritional Needs:   Kcal:  1700-1900  Protein:  70-80g  Fluid:  1.7-1.9L/day  EDUCATION NEEDS:   Education needs addressed  Tilda FrancoLindsey Reginold Beale, MS, RD, LDN Pager: [phone] After Hours Pager: [phone]

## 2016-05-07 NOTE — Progress Notes (Signed)
Pt is ready to return to [facility] today. Pt / spouse are in agreement with d/c plan. [facility] transport is required. Pt / spouse are aware out of pocket costs may be associated with [facility] transport. Medical necessity form completed. D/C Summary sent to SNF for review. No scripts printed. # for report provided to nsg.  Cori RazorJamie Ascher Schroepfer LCSW [phone]

## 2016-05-08 LAB — URINE CULTURE

## 2016-05-29 ENCOUNTER — Non-Acute Institutional Stay (SKILLED_NURSING_FACILITY): Payer: Medicare Other | Admitting: Adult Health

## 2016-05-29 ENCOUNTER — Encounter: Payer: Self-pay | Admitting: Adult Health

## 2016-05-29 DIAGNOSIS — E1065 Type 1 diabetes mellitus with hyperglycemia: Secondary | ICD-10-CM

## 2016-05-29 DIAGNOSIS — J449 Chronic obstructive pulmonary disease, unspecified: Secondary | ICD-10-CM

## 2016-05-29 DIAGNOSIS — R413 Other amnesia: Secondary | ICD-10-CM

## 2016-05-29 DIAGNOSIS — N320 Bladder-neck obstruction: Secondary | ICD-10-CM | POA: Diagnosis not present

## 2016-05-29 DIAGNOSIS — I10 Essential (primary) hypertension: Secondary | ICD-10-CM | POA: Diagnosis not present

## 2016-05-29 DIAGNOSIS — E559 Vitamin D deficiency, unspecified: Secondary | ICD-10-CM | POA: Diagnosis not present

## 2016-05-29 DIAGNOSIS — F341 Dysthymic disorder: Secondary | ICD-10-CM

## 2016-05-29 NOTE — Progress Notes (Signed)
Patient ID: Benjamin Riddle, male   DOB: [birth year], 82 y.o.   MRN: [id number]     Nursing Home Location:   [facility]    Code Status: DNR  Patient Care Team: Kermit Balo, DO as PCP - General (Geriatric Medicine) Barron Alvine, MD as Consulting Physician (Urology) Hali Marry, MD as Consulting Physician (Endocrinology) Jethro Bolus, MD as Consulting Physician (Ophthalmology) Cammy Copa, MD as Consulting Physician (Orthopedic Surgery) Sharrell Ku, MD as Consulting Physician (Gastroenterology) Peter M [place], MD as Consulting Physician (Cardiology) Merwyn Katos, MD as Consulting Physician (Pulmonary Disease)  Goals of care: Advanced Directive information DNR Advanced Directives 05/05/2016  Does patient have an advance directive? Yes  Type of Advance Directive Out of facility DNR (pink MOST or yellow form)  Does patient want to make changes to advanced directive? No - Patient declined  Copy of advanced directive(s) in chart? Yes  Would patient like information on creating an advanced directive? -  Pre-existing out of facility DNR order (yellow form or pink MOST form) Yellow form placed in chart (order not valid for inpatient use)     Place of Service: SNF (31)  Chief Complaint  Patient presents with  . Medical Management of Chronic Issues    HPI:  82 y.o. male residing at [facility]. I am here to review his chronic medical issues.  VS have been stable over the past month. Weight has remained stable at 132 lbs.   9/22 Right pleural effusion, drained by IR 1400cc.  Exudative from previous pna. CXR 10/2 showed no acute pathology, resolved pleural effusion No sob, cp, fever, malaise, pain  DM Type 1: CBGs  BF 60-131, 1030a  209-262, Lunch 84-228, 230p 79-247, supper 93-246 A1C 6.5 on 04/21/16, which was significantly lower than his base of 9%.  Our records did not indicate any significant lows, but occasional borderline lows in Oct.   There is concern this may be happening in the middle of the night.  His am number is in the 60's much of the time. Its not clear if he is receiving a bedtime snack.  BP controlled with Norvasc 5 mg qd  COPD Gold III (FEV1 42%): denies cough or sputum production. Not sob as he is mostly wheelchair bound.  Can walk short distances with assist. Uses Dulera and needs coaching with technique.  Memory loss MMSE 24/30 failed clock 02/04/16, not currently on medication for memory  Urine micro albumin/Cr ratio 1963,  Micro albumin 73 on 10/16 17.  Not on ace or arb.  Falls frequently.  BUN/CR 39/1.7  Review of Systems:  Review of Systems  Constitutional: Negative for activity change, appetite change, chills, diaphoresis, fatigue, fever and unexpected weight change.  Respiratory: Negative for cough, shortness of breath, wheezing and stridor.   Cardiovascular: Negative for chest pain, palpitations and leg swelling.  Gastrointestinal: Negative for abdominal distention, abdominal pain, constipation and diarrhea.  Genitourinary: Negative for difficulty urinating and dysuria.  Musculoskeletal: Positive for arthralgias and gait problem. Negative for back pain, joint swelling and myalgias.  Neurological: Negative for dizziness, seizures, syncope, facial asymmetry, speech difficulty, weakness and headaches.  Hematological: Negative for adenopathy. Does not bruise/bleed easily.  Psychiatric/Behavioral: Positive for confusion. Negative for agitation and behavioral problems.    Medications: Patient's Medications  New Prescriptions   No medications on file  Previous Medications   ACETAMINOPHEN (TYLENOL) 500 MG TABLET    Take 500 mg by mouth every 8 (eight) hours as needed for mild pain,  moderate pain or fever.   ALBUTEROL (PROVENTIL HFA;VENTOLIN HFA) 108 (90 BASE) MCG/ACT INHALER    Inhale 2 puffs into the lungs every 4 (four) hours as needed for wheezing or shortness of breath.   ALBUTEROL (PROVENTIL) (2.5  MG/3ML) 0.083% NEBULIZER SOLUTION    Take 2.5 mg by nebulization every 6 (six) hours as needed for wheezing or shortness of breath.   AMLODIPINE (NORVASC) 5 MG TABLET    Take 5 mg by mouth daily.   BISMUTH SUBSALICYLATE (PEPTO BISMOL) 262 MG CHEWABLE TABLET    Chew 524 mg by mouth as needed for indigestion.    CEPHALEXIN (KEFLEX) 250 MG CAPSULE    Take 250 mg by mouth every morning.   CHOLECALCIFEROL (VITAMIN D) 1000 UNITS TABLET    Take 1,000 Units by mouth daily.   FEEDING SUPPLEMENT, GLUCERNA SHAKE, (GLUCERNA SHAKE) LIQD    Take 237 mLs by mouth daily as needed (nutritional supplement).    FEEDING SUPPLEMENT, GLUCERNA SHAKE, (GLUCERNA SHAKE) LIQD    Take 237 mLs by mouth 2 (two) times daily between meals.   INSULIN ASPART (NOVOLOG) 100 UNIT/ML INJECTION    Inject 5-10 Units into the skin 3 (three) times daily before meals. 5 units at lunch, 10 units at breakfast and supper. Give 1 units for every 1 unit over 150.   INSULIN GLARGINE (TOUJEO SOLOSTAR) 300 UNIT/ML SOPN    Inject 18 Units into the skin at bedtime.    MENTHOL, TOPICAL ANALGESIC, (BIOFREEZE EX)    Apply 1 application topically 4 (four) times daily as needed.   METHOCARBAMOL (ROBAXIN) 500 MG TABLET    Take 500 mg by mouth every 8 (eight) hours as needed for muscle spasms.    MOMETASONE-FORMOTEROL (DULERA) 100-5 MCG/ACT AERO    Inhale 2 puffs into the lungs 2 (two) times daily.   POLYETHYLENE GLYCOL (MIRALAX / GLYCOLAX) PACKET    Take 17 g by mouth daily.   SACCHAROMYCES BOULARDII (FLORASTOR) 250 MG CAPSULE    Take 250 mg by mouth 2 (two) times daily.    SERTRALINE (ZOLOFT) 50 MG TABLET    Take 50 mg by mouth at bedtime.    SIMVASTATIN (ZOCOR) 40 MG TABLET    Take 40 mg by mouth daily.     TRAMADOL (ULTRAM) 50 MG TABLET    Take 50 mg by mouth every 6 (six) hours as needed for severe pain.  Modified Medications   No medications on file  Discontinued Medications   No medications on file     Physical Exam:  Vitals:   05/29/16 1007   BP: 118/73  Pulse: 65  Resp: 20  Temp: 97.3 F (36.3 C)  SpO2: 98%  Weight: 132 lb 3.2 oz (60 kg)    Physical Exam  Constitutional: No distress.  HENT:  Head: Normocephalic and atraumatic.  Cardiovascular: Normal rate and regular rhythm.   No murmur heard. Pulmonary/Chest: Effort normal. He has wheezes (exp mild).  Abdominal: Soft. Bowel sounds are normal.  Musculoskeletal: He exhibits no edema or tenderness.  Neurological: He is alert.  Skin: Skin is warm and dry. He is not diaphoretic.  Psychiatric:  smiles  Nursing note and vitals reviewed.   Wt Readings from Last 3 Encounters:  05/29/16 132 lb 3.2 oz (60 kg)  05/06/16 130 lb (59 kg)  04/21/16 131 lb (59.4 kg)     Labs reviewed/Significant Diagnostic Results:  Basic Metabolic Panel:  Recent Labs  16/04/9609/17/17 2033 05/06/16 0003 05/07/16 0443  NA 140 138 140  K 4.3 4.6 4.3  CL 109 109 109  CO2 24 22 24   GLUCOSE 142* 192* 83  BUN 48* 50* 39*  CREATININE 2.02* 2.03* 1.70*  CALCIUM 9.1 8.8* 9.3   Liver Function Tests:  Recent Labs  06/20/15 0446 11/06/15 2358 01/27/16 1021  AST 22 29 42*  ALT 14* 18 40  ALKPHOS 39 69 85  BILITOT 0.1* 0.5 1.3*  PROT 5.4* 7.5 6.4*  ALBUMIN 2.5* 4.0 3.5   No results for input(s): LIPASE, AMYLASE in the last 8760 hours. No results for input(s): AMMONIA in the last 8760 hours. CBC:  Recent Labs  11/06/15 2358  01/27/16 1021  03/29/16 1345  05/05/16 0650 05/05/16 1612 05/07/16 0443  WBC 10.0  < > 6.8  < > 10.8*  < > 7.0 6.9 6.5  NEUTROABS 7.3  --  4.7  --  7.2  --   --   --   --   HGB 13.3  < > 12.2*  < > 12.6*  < > 11.0* 11.6* 11.2*  HCT 38.2*  < > 34.7*  < > 38.1*  < > 35.3* 35.5* 34.5*  MCV 89.7  < > 89.4  --  92.0  --  93.4 90.3 90.6  PLT 327  < > 316  < > 565*  < > 403* 432* 425*  < > = values in this interval not displayed. CBG:  Recent Labs  05/07/16 0801 05/07/16 1142 05/07/16 1347  GLUCAP 90 325* 268*   TSH:  Recent Labs  06/21/15 0945    TSH 1.241   A1C: Lab Results  Component Value Date   HGBA1C 6.5 04/21/2016   Lipid Panel: No results for input(s): CHOL, HDL, LDLCALC, TRIG, CHOLHDL, LDLDIRECT in the last 8760 hours.     Assessment/Plan  1. Essential hypertension Controlled Continue Norvasc 5 mg qd  2. COPD GOLD III  Stable Continue Dulera  3. Uncontrolled type 1 diabetes mellitus with hyperglycemia (HCC) Decrease Toujeo 15 units qhs  Decrease supper Novolog to 5 units with meals, giving 1 units for every 50 increment over 150, leave breakfast and lunch the same. Would hold Novolog if CBG <70.  Goal A1C<8%  4. Bladder outlet obstruction Stable Remains with a foley, no s/s of UTI On keflex for UTI prevention  5. Memory loss Unchanged, would consider namenda in the future  6. Dysthymia Stable Continue zoloft 50 mg qhs  7. Vitamin D deficiency Continue Vit D supplementation 1000 units qd  8. CKD stage IV Continue to monitor and avoid nephrotoxic agents Would not initiate ACE due to renal function and frailty   Peggye Leyhristy Aaidyn San, ANP [facility] [phone]

## 2016-06-02 ENCOUNTER — Ambulatory Visit: Payer: Medicare Other | Admitting: Endocrinology

## 2016-06-23 ENCOUNTER — Non-Acute Institutional Stay (SKILLED_NURSING_FACILITY): Payer: Medicare Other | Admitting: Internal Medicine

## 2016-06-23 ENCOUNTER — Encounter: Payer: Self-pay | Admitting: Internal Medicine

## 2016-06-23 DIAGNOSIS — N184 Chronic kidney disease, stage 4 (severe): Secondary | ICD-10-CM | POA: Diagnosis not present

## 2016-06-23 DIAGNOSIS — S6721XA Crushing injury of right hand, initial encounter: Secondary | ICD-10-CM

## 2016-06-23 DIAGNOSIS — E1065 Type 1 diabetes mellitus with hyperglycemia: Secondary | ICD-10-CM

## 2016-06-23 NOTE — Progress Notes (Signed)
Patient ID: Benjamin Riddle, male   DOB: 17-Feb-1928, 82 y.o.   MRN: [id number]  Location:  [facility] Room Number: 126 Place of Service:  SNF (31) Provider:  Rutledge Selsor L. Renato Gailseed, D.O., C.M.D.  Bufford SpikesEED, Zaveon Gillen, DO  Patient Care Team: Kermit Baloiffany L Lakrista Scaduto, DO as PCP - General (Geriatric Medicine) Barron Alvineavid Grapey, MD as Consulting Physician (Urology) Hali MarryMichael Altheimer, MD as Consulting Physician (Endocrinology) Jethro BolusMark Shapiro, MD as Consulting Physician (Ophthalmology) Cammy CopaScott Gregory Dean, MD as Consulting Physician (Orthopedic Surgery) Sharrell KuJeffrey Medoff, MD as Consulting Physician (Gastroenterology) Peter M [place], MD as Consulting Physician (Cardiology) Merwyn Katosavid B Simonds, MD as Consulting Physician (Pulmonary Disease)  Extended Emergency Contact Information Primary Emergency Contact: Eimers,Ingrid Address[address]          [place] [id number] [place] Home Phone: [phone] Mobile Phone: [phone] Relation: Spouse  Code Status:  DNR Goals of care: Advanced Directive information Advanced Directives 06/23/2016  Does Patient Have a Medical Advance Directive? Yes  Type of Advance Directive Out of facility DNR (pink MOST or yellow form);Healthcare Power of [place];Living will  Does patient want to make changes to medical advance directive? -  Copy of Healthcare Power of Attorney in Chart? Yes  Would patient like information on creating a medical advance directive? -  Pre-existing out of facility DNR order (yellow form or pink MOST form) Yellow form placed in chart (order not valid for inpatient use)     Chief Complaint  Patient presents with  . Acute Visit    right hand swollen, painful s/p fall hitting head    HPI:  Pt is a 82 y.o. male seen today for an acute visit for swelling and pain of his right dorsal hand s/p fall where he struck his head.  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 He sustained a fall and has had pain and swelling of the right hand since.  He has difficulty making a fist and exquisite tenderness over the dorsum in the tendon region.  He believes he landed on his hand.  xrays portable have been negative for the right hand and wrist.  He's only been receiving tylenol and requests something stronger for the pain.     Past Medical History:  Diagnosis Date  . Allergic rhinitis   . Allergy   . Asthma   . Cataract   . CKD (chronic kidney disease), stage III   . Combined systolic and diastolic congestive heart failure (HCC)   . COPD (chronic obstructive pulmonary disease) (HCC)   . Diabetes mellitus   . Heart murmur   . Hepatitis C    due to blood transfusion  . Hydronephrosis   . Hyperlipidemia   . Hypertension   . Hypogonadism male   . Kidney disease    Stage III chronic kidney disease  . Neurogenic bladder   . Osteoporosis   . Recurrent urinary tract infection    With resistent Pseudomonas  . SOB (shortness of breath)    Past Surgical History:  Procedure Laterality Date  . BLADDER REPAIR     obstruction surgery  . CATARACT EXTRACTION  2015  . EYE SURGERY    . FRACTURE SURGERY    . FRONTAL SINUS OBLITERATION     sinus repaired with a plate  . HERNIA REPAIR    . PROSTATE SURGERY    . SPINE SURGERY    . TONSILLECTOMY  Allergies  Allergen Reactions  . Aspirin Other (See Comments)    Per patient states causes him to bleed easily   . Bactrim [Sulfamethoxazole-Trimethoprim] Nausea And Vomiting  . Codeine Nausea And Vomiting  . Gentamicin Other (See Comments)    Becomes dizzy and weak  . Lexapro [Escitalopram Oxalate] Other (See Comments)    Unknown to patient  . Protonix [Pantoprazole Sodium] Diarrhea  . Ciprofloxacin Itching and Rash      Medication List       Accurate as of 06/23/16  1:55 PM. Always use your most recent med list.            acetaminophen 500 MG tablet Commonly known as:  TYLENOL Take 500 mg by mouth every 8 (eight) hours as needed for mild pain, moderate pain or fever.   amLODipine 5 MG tablet Commonly known as:  NORVASC Take 5 mg by mouth daily.   BIOFREEZE EX Apply 1 application topically 4 (four) times daily as needed.   bismuth subsalicylate 262 MG chewable tablet Commonly known as:  PEPTO BISMOL Chew 524 mg by mouth as needed for indigestion.   cephALEXin 250 MG capsule Commonly known as:  KEFLEX Take 250 mg by mouth every morning.   cholecalciferol 1000 units tablet Commonly known as:  VITAMIN D Take 1,000 Units by mouth daily.   DULERA 100-5 MCG/ACT Aero Generic drug:  mometasone-formoterol Inhale 2 puffs into the lungs 2 (two) times daily. Rinse mouth after using   feeding supplement (GLUCERNA SHAKE) Liqd Take 237 mLs by mouth daily as needed (nutritional supplement).   feeding supplement (GLUCERNA SHAKE) Liqd Take 237 mLs by mouth 2 (two) times daily between meals.   insulin aspart 100 UNIT/ML injection Commonly known as:  novoLOG Inject 5-10 Units into the skin 3 (three) times daily before meals. 5 units at lunch, 10 units at breakfast and supper. Give 1 units for every 1 unit over 150.   polyethylene glycol packet Commonly known as:  MIRALAX / GLYCOLAX Take 17 g by mouth daily.   saccharomyces boulardii 250 MG capsule Commonly known as:  FLORASTOR Take 250 mg by mouth 2 (two) times daily.   sertraline 50 MG tablet Commonly known as:  ZOLOFT Take 50 mg by mouth at bedtime.   simvastatin 40 MG tablet Commonly known as:  ZOCOR Take 40 mg by mouth daily.   TOUJEO SOLOSTAR 300 UNIT/ML Sopn Generic drug:  Insulin Glargine Inject 18 Units into the skin at bedtime.   traMADol 50 MG tablet Commonly known as:  ULTRAM Take 50 mg by mouth every 6 (six) hours as needed for severe pain.       Review of Systems  Constitutional: Positive for activity change, appetite change  and fatigue.       Losing weight, spending more time in bed  Eyes: Negative for visual disturbance.       Glasses  Respiratory: Negative for chest tightness and shortness of breath.   Cardiovascular: Negative for chest pain and leg swelling.  Gastrointestinal: Negative for abdominal distention, abdominal pain, blood in stool and constipation.  Genitourinary: Negative for dysuria.       Foley catheter in place for retention  Musculoskeletal: Positive for gait problem.       Right hand swelling, tenderness over tendons and hand bones, no ecchymoses, decreased hand grip on right vs. left  Neurological: Positive for weakness and numbness. Negative for dizziness.  Hematological: Negative for adenopathy. Bruises/bleeds easily.  Psychiatric/Behavioral: Negative for agitation.  Immunization History  Administered Date(s) Administered  . Influenza Split 04/19/2014  . Influenza-Unspecified 06/28/2015, 05/14/2016  . Pneumococcal Conjugate-13 10/26/2012, 05/24/2013, 07/20/2014  . Pneumococcal Polysaccharide-23 07/28/2011  . Td 07/21/2007, 07/28/2011  . Zoster 07/28/2011, 06/21/2012   Pertinent  Health Maintenance Due  Topic Date Due  . FOOT EXAM  06/18/1940  . OPHTHALMOLOGY EXAM  01/28/2016  . URINE MICROALBUMIN  07/25/2016  . HEMOGLOBIN A1C  10/20/2016  . INFLUENZA VACCINE  Completed  . PNA vac Low Risk Adult  Completed   Fall Risk  04/23/2015  Falls in the past year? Yes  Number falls in past yr: 1   Functional Status Survey:    Vitals:   06/23/16 1344  BP: (!) 168/88  Pulse: 70  Resp: 18  Temp: 97.1 F (36.2 C)  TempSrc: Oral  SpO2: 94%   There is no height or weight on file to calculate BMI. Physical Exam  Constitutional: He is oriented to person, place, and time. No distress.  Cachectic-appearing male  Cardiovascular: Normal rate and regular rhythm.   Pulmonary/Chest: Effort normal and breath sounds normal.  Abdominal: Soft. Bowel sounds are normal.    Musculoskeletal: He exhibits tenderness.  See ROS   Neurological: He is alert and oriented to person, place, and time.  Skin: Skin is warm and dry.    Labs reviewed:  Recent Labs  05/05/16 2033 05/06/16 0003 05/07/16 0443  NA 140 138 140  K 4.3 4.6 4.3  CL 109 109 109  CO2 24 22 24   GLUCOSE 142* 192* 83  BUN 48* 50* 39*  CREATININE 2.02* 2.03* 1.70*  CALCIUM 9.1 8.8* 9.3    Recent Labs  11/06/15 2358 01/27/16 1021  AST 29 42*  ALT 18 40  ALKPHOS 69 85  BILITOT 0.5 1.3*  PROT 7.5 6.4*  ALBUMIN 4.0 3.5    Recent Labs  11/06/15 2358  01/27/16 1021  03/29/16 1345  05/05/16 0650 05/05/16 1612 05/07/16 0443  WBC 10.0  < > 6.8  < > 10.8*  < > 7.0 6.9 6.5  NEUTROABS 7.3  --  4.7  --  7.2  --   --   --   --   HGB 13.3  < > 12.2*  < > 12.6*  < > 11.0* 11.6* 11.2*  HCT 38.2*  < > 34.7*  < > 38.1*  < > 35.3* 35.5* 34.5*  MCV 89.7  < > 89.4  --  92.0  --  93.4 90.3 90.6  PLT 327  < > 316  < > 565*  < > 403* 432* 425*  < > = values in this interval not displayed. Lab Results  Component Value Date   TSH 1.241 06/21/2015   Lab Results  Component Value Date   HGBA1C 6.5 04/21/2016   Lab Results  Component Value Date   CHOL 137 01/05/2015   HDL 48 01/05/2015   LDLCALC 59 01/05/2015   TRIG 148 01/05/2015   CHOLHDL 2.9 01/05/2015    Significant Diagnostic Results in last 30 days:  Right hand xrays:  No acute fx  Assessment/Plan 1. Crushing injury of right hand, initial encounter -s/p fall -will apply ice qid for 20 min intervals -voltaren gel qid to the hand -tramadol 50mg  po tid for 7 days -if this is not improving, may need to see orthopedics in case initial films just didn't show the fracture  2. Uncontrolled type 1 diabetes mellitus with hyperglycemia (HCC) -sugars are highly variable but mostly high despite close  monitoring and adjustments here and by endocrine -also keeps getting infections again and again in his urinary tract in a vicious  cycle -cont same insulin regimen and monitor  3. Chronic kidney disease (CKD), stage IV (severe) (HCC) -due to #2 -avoid nephrotoxic meds and try to maintain hydration, dose adjust meds as needed  Family/ staff Communication: discussed with SNF nurse  Labs/tests ordered:  No new tests

## 2016-06-29 ENCOUNTER — Non-Acute Institutional Stay (SKILLED_NURSING_FACILITY): Payer: Medicare Other | Admitting: Adult Health

## 2016-06-29 ENCOUNTER — Encounter: Payer: Self-pay | Admitting: Adult Health

## 2016-06-29 DIAGNOSIS — M25421 Effusion, right elbow: Secondary | ICD-10-CM

## 2016-06-29 NOTE — Progress Notes (Signed)
[facility]   Place of Service:  SNF (31) Provider:   Peggye Leyhristy Eddy Liszewski, ANP [facility] [phone]   REED, Elmarie ShileyIFFANY, DO  Patient Care Team: Kermit Baloiffany L Reed, DO as PCP - General (Geriatric Medicine) Barron Alvineavid Grapey, MD as Consulting Physician (Urology) Hali MarryMichael Altheimer, MD as Consulting Physician (Endocrinology) Jethro BolusMark Shapiro, MD as Consulting Physician (Ophthalmology) Cammy CopaScott Gregory Dean, MD as Consulting Physician (Orthopedic Surgery) Sharrell KuJeffrey Medoff, MD as Consulting Physician (Gastroenterology) Peter M Swazil[place]Jordan, MD as Consulting Physician (Cardiology) Merwyn Katosavid B Simonds, MD as Consulting Physician (Pulmonary Disease)  Extended Emergency Contact Information Primary Emergency Contact: Futch,Ingrid Address: [address]          [place] [id number] [place] Home Phone: [phone] Mobile Phone: [phone] Relation: Spouse  Code Status:  DNR Goals of care: Advanced Directive information Advanced Directives 06/23/2016  Does Patient Have a Medical Advance Directive? Yes  Type of Advance Directive Out of facility DNR (pink MOST or yellow form);Healthcare Power of [place];Living will  Does patient want to make changes to medical advance directive? -  Copy of Healthcare Power of Attorney in Chart? Yes  Would patient like information on creating a medical advance directive? -  Pre-existing out of facility DNR order (yellow form or pink MOST form) Yellow form placed in chart (order not valid for inpatient use)     Chief Complaint  Patient presents with  . Acute Visit    right arm swelling    HPI:  Pt is a 82 y.o. male seen today for an acute visit for right arm swelling. He was seen on 12/5 for a possible fx after a fall. Xray was negative but due to his continued swelling and pain he was sent to ortho. Xrays there were neg and he was placed in a brace for comfort. As of 06/29/16 the nurse noted that the swelling and  redness had worsened and was also present in the elbow and upper arm. He has not had a fever. He is currently on Rocephin for a UTI.  Previous uric acid level was WNL.  He denies worsening pain. Denies numbness or tingling.     Past Medical History:  Diagnosis Date  . Allergic rhinitis   . Allergy   . Asthma   . Cataract   . CKD (chronic kidney disease), stage III   . Combined systolic and diastolic congestive heart failure (HCC)   . COPD (chronic obstructive pulmonary disease) (HCC)   . Diabetes mellitus   . Heart murmur   . Hepatitis C    due to blood transfusion  . Hydronephrosis   . Hyperlipidemia   . Hypertension   . Hypogonadism male   . Kidney disease    Stage III chronic kidney disease  . Neurogenic bladder   . Osteoporosis   . Recurrent urinary tract infection    With resistent Pseudomonas  . SOB (shortness of breath)    Past Surgical History:  Procedure Laterality Date  . BLADDER REPAIR     obstruction surgery  . CATARACT EXTRACTION  2015  . EYE SURGERY    . FRACTURE SURGERY    . FRONTAL SINUS OBLITERATION     sinus repaired with a plate  . HERNIA REPAIR    . PROSTATE SURGERY    . SPINE SURGERY    . TONSILLECTOMY      Allergies  Allergen Reactions  . Aspirin Other (See Comments)    Per patient states causes him to bleed easily   .  Bactrim [Sulfamethoxazole-Trimethoprim] Nausea And Vomiting  . Codeine Nausea And Vomiting  . Gentamicin Other (See Comments)    Becomes dizzy and weak  . Lexapro [Escitalopram Oxalate] Other (See Comments)    Unknown to patient  . Protonix [Pantoprazole Sodium] Diarrhea  . Ciprofloxacin Itching and Rash      Medication List       Accurate as of 06/29/16  1:33 PM. Always use your most recent med list.          acetaminophen 500 MG tablet Commonly known as:  TYLENOL Take 500 mg by mouth every 8 (eight) hours as needed for mild pain, moderate pain or fever.   amLODipine 5 MG tablet Commonly known as:   NORVASC Take 5 mg by mouth daily.   BIOFREEZE EX Apply 1 application topically 4 (four) times daily as needed.   bismuth subsalicylate 262 MG chewable tablet Commonly known as:  PEPTO BISMOL Chew 524 mg by mouth as needed for indigestion.   cephALEXin 250 MG capsule Commonly known as:  KEFLEX Take 250 mg by mouth every morning.   cholecalciferol 1000 units tablet Commonly known as:  VITAMIN D Take 1,000 Units by mouth daily.   DULERA 100-5 MCG/ACT Aero Generic drug:  mometasone-formoterol Inhale 2 puffs into the lungs 2 (two) times daily. Rinse mouth after using   feeding supplement (GLUCERNA SHAKE) Liqd Take 237 mLs by mouth daily as needed (nutritional supplement).   feeding supplement (GLUCERNA SHAKE) Liqd Take 237 mLs by mouth 2 (two) times daily between meals.   insulin aspart 100 UNIT/ML injection Commonly known as:  novoLOG Inject 5-10 Units into the skin 3 (three) times daily before meals. 5 units at lunch, 10 units at breakfast and supper. Give 1 units for every 1 unit over 150.   polyethylene glycol packet Commonly known as:  MIRALAX / GLYCOLAX Take 17 g by mouth daily.   saccharomyces boulardii 250 MG capsule Commonly known as:  FLORASTOR Take 250 mg by mouth 2 (two) times daily.   sertraline 50 MG tablet Commonly known as:  ZOLOFT Take 50 mg by mouth at bedtime.   simvastatin 40 MG tablet Commonly known as:  ZOCOR Take 40 mg by mouth daily.   TOUJEO SOLOSTAR 300 UNIT/ML Sopn Generic drug:  Insulin Glargine Inject 18 Units into the skin at bedtime.   traMADol 50 MG tablet Commonly known as:  ULTRAM Take 50 mg by mouth every 6 (six) hours as needed for severe pain.       Review of Systems  Respiratory: Negative for cough and shortness of breath.   Cardiovascular: Negative for chest pain and leg swelling.  Musculoskeletal: Positive for arthralgias, gait problem and joint swelling.  Skin: Positive for rash.  Psychiatric/Behavioral: Positive for  confusion.    Immunization History  Administered Date(s) Administered  . Influenza Split 04/19/2014  . Influenza-Unspecified 06/28/2015, 05/14/2016  . Pneumococcal Conjugate-13 10/26/2012, 05/24/2013, 07/20/2014  . Pneumococcal Polysaccharide-23 07/28/2011  . Td 07/21/2007, 07/28/2011  . Zoster 07/28/2011, 06/21/2012   Pertinent  Health Maintenance Due  Topic Date Due  . FOOT EXAM  06/18/1938  . OPHTHALMOLOGY EXAM  01/28/2016  . URINE MICROALBUMIN  07/25/2016  . HEMOGLOBIN A1C  10/20/2016  . INFLUENZA VACCINE  Completed  . PNA vac Low Risk Adult  Completed   Fall Risk  04/23/2015  Falls in the past year? Yes  Number falls in past yr: 1   Functional Status Survey:    Vitals:   06/29/16 1333  BP:  122/75  Pulse: 60  Resp: 18  Temp: 98.2 F (36.8 C)  SpO2: 99%   There is no height or weight on file to calculate BMI. Physical Exam  Constitutional: No distress.  Cardiovascular:  Right positive radial pulse  Musculoskeletal: He exhibits edema and tenderness. He exhibits no deformity.  Neurological: He is alert.  Oriented x 2  Skin: Skin is warm and dry. He is not diaphoretic.  Psychiatric: He has a normal mood and affect.  Nursing note and vitals reviewed.   Labs reviewed:  Recent Labs  05/05/16 2033 05/06/16 0003 05/07/16 0443  NA 140 138 140  K 4.3 4.6 4.3  CL 109 109 109  CO2 24 22 24   GLUCOSE 142* 192* 83  BUN 48* 50* 39*  CREATININE 2.02* 2.03* 1.70*  CALCIUM 9.1 8.8* 9.3    Recent Labs  11/06/15 2358 01/27/16 1021  AST 29 42*  ALT 18 40  ALKPHOS 69 85  BILITOT 0.5 1.3*  PROT 7.5 6.4*  ALBUMIN 4.0 3.5    Recent Labs  11/06/15 2358  01/27/16 1021  03/29/16 1345  05/05/16 0650 05/05/16 1612 05/07/16 0443  WBC 10.0  < > 6.8  < > 10.8*  < > 7.0 6.9 6.5  NEUTROABS 7.3  --  4.7  --  7.2  --   --   --   --   HGB 13.3  < > 12.2*  < > 12.6*  < > 11.0* 11.6* 11.2*  HCT 38.2*  < > 34.7*  < > 38.1*  < > 35.3* 35.5* 34.5*  MCV 89.7  < > 89.4   --  92.0  --  93.4 90.3 90.6  PLT 327  < > 316  < > 565*  < > 403* 432* 425*  < > = values in this interval not displayed. Lab Results  Component Value Date   TSH 1.241 06/21/2015   Lab Results  Component Value Date   HGBA1C 6.5 04/21/2016   Lab Results  Component Value Date   CHOL 137 01/05/2015   HDL 48 01/05/2015   LDLCALC 59 01/05/2015   TRIG 148 01/05/2015   CHOLHDL 2.9 01/05/2015    Significant Diagnostic Results in last 30 days:  No results found.  Assessment/Plan  1) Right hand and arm swelling  -check doppler to rule out DVT as the edema extends to the elbow area -return to ortho for further eval if doppler neg -ice 15 min TID x 72 hrs -elevate on a pillow  Family/ staff Communication: discussed with staff/resident  Labs/tests ordered:  NA

## 2016-07-27 ENCOUNTER — Non-Acute Institutional Stay (SKILLED_NURSING_FACILITY): Payer: Medicare Other | Admitting: Adult Health

## 2016-07-27 ENCOUNTER — Encounter: Payer: Self-pay | Admitting: Adult Health

## 2016-07-27 DIAGNOSIS — J9601 Acute respiratory failure with hypoxia: Secondary | ICD-10-CM | POA: Diagnosis not present

## 2016-07-27 DIAGNOSIS — Z7189 Other specified counseling: Secondary | ICD-10-CM

## 2016-07-27 DIAGNOSIS — E1065 Type 1 diabetes mellitus with hyperglycemia: Secondary | ICD-10-CM | POA: Diagnosis not present

## 2016-07-27 DIAGNOSIS — L03115 Cellulitis of right lower limb: Secondary | ICD-10-CM | POA: Diagnosis not present

## 2016-07-27 NOTE — Progress Notes (Signed)
[facility]   Place of Service:  SNF (31) Provider:   Peggye Leyhristy Emelin Dascenzo, ANP [facility] [phone]   REED, Elmarie ShileyIFFANY, DO  Patient Care Team: Kermit Baloiffany L Reed, DO as PCP - General (Geriatric Medicine) Barron Alvineavid Grapey, MD as Consulting Physician (Urology) Hali MarryMichael Altheimer, MD as Consulting Physician (Endocrinology) Jethro BolusMark Shapiro, MD as Consulting Physician (Ophthalmology) Cammy CopaScott Gregory Dean, MD as Consulting Physician (Orthopedic Surgery) Sharrell KuJeffrey Medoff, MD as Consulting Physician (Gastroenterology) Peter M [place], MD as Consulting Physician (Cardiology) Merwyn Katosavid B Simonds, MD as Consulting Physician (Pulmonary Disease)  Extended Emergency Contact Information Primary Emergency Contact: Abee,Ingrid Address: [address]          [place] [phone] [place] Home Phone: [phone] Mobile Phone: [phone] Relation: Spouse  Code Status:  DNR Goals of care: Advanced Directive information Advanced Directives 06/23/2016  Does Patient Have a Medical Advance Directive? Yes  Type of Advance Directive Out of facility DNR (pink MOST or yellow form);Healthcare Power of [place];Living will  Does patient want to make changes to medical advance directive? -  Copy of Healthcare Power of Attorney in Chart? Yes  Would patient like information on creating a medical advance directive? -  Pre-existing out of facility DNR order (yellow form or pink MOST form) Yellow form placed in chart (order not valid for inpatient use)     Chief Complaint  Patient presents with  . Acute Visit    lethargy, SOB    HPI:  Pt is a 83 y.o. male seen today for an acute visit for lethargy and shortness of breath.  [facility] He resides in skilled care due to dementia and debility. He has difficult to control DM Type 1  With frequent infections. CXR on 1/5 showed no acute abnormality.  He is BP was elevated up to  200/100 by the report that was given to me and he was given one dose of clonidine.  BP now is 144/86.  The nurse reports that he is not eating, difficult to arouse, and has labored breathing. His 02 sats are 88% on 3L.  He does not have cough or any chest pain.  His CBG this am was 364 despite very little intake.  He has had a poor quality of life per his wife since moving to skilled care.  Over the weekend she told the staff that she wanted him to be comfort care only with no hospitalizations.     Past Medical History:  Diagnosis Date  . Allergic rhinitis   . Allergy   . Asthma   . Cataract   . CKD (chronic kidney disease), stage III   . Combined systolic and diastolic congestive heart failure (HCC)   . COPD (chronic obstructive pulmonary disease) (HCC)   . Diabetes mellitus   . Heart murmur   . Hepatitis C    due to blood transfusion  . Hydronephrosis   . Hyperlipidemia   . Hypertension   . Hypogonadism male   . Kidney disease    Stage III chronic kidney disease  . Neurogenic bladder   . Osteoporosis   . Recurrent urinary tract infection    With resistent Pseudomonas  . SOB (shortness of breath)    Past Surgical History:  Procedure Laterality Date  . BLADDER REPAIR     obstruction surgery  . CATARACT EXTRACTION  2015  . EYE SURGERY    . FRACTURE SURGERY    . FRONTAL  SINUS OBLITERATION     sinus repaired with a plate  . HERNIA REPAIR    . PROSTATE SURGERY    . SPINE SURGERY    . TONSILLECTOMY      Allergies  Allergen Reactions  . Aspirin Other (See Comments)    Per patient states causes him to bleed easily   . Bactrim [Sulfamethoxazole-Trimethoprim] Nausea And Vomiting  . Codeine Nausea And Vomiting  . Gentamicin Other (See Comments)    Becomes dizzy and weak  . Lexapro [Escitalopram Oxalate] Other (See Comments)    Unknown to patient  . Protonix [Pantoprazole Sodium] Diarrhea  . Ciprofloxacin Itching and Rash    Allergies as of 07/27/2016      Reactions    Aspirin Other (See Comments)   Per patient states causes him to bleed easily   Bactrim [sulfamethoxazole-trimethoprim] Nausea And Vomiting   Codeine Nausea And Vomiting   Gentamicin Other (See Comments)   Becomes dizzy and weak   Lexapro [escitalopram Oxalate] Other (See Comments)   Unknown to patient   Protonix [pantoprazole Sodium] Diarrhea   Ciprofloxacin Itching, Rash      Medication List       Accurate as of 07/27/16 10:05 AM. Always use your most recent med list.          acetaminophen 500 MG tablet Commonly known as:  TYLENOL Take 500 mg by mouth every 8 (eight) hours as needed for mild pain, moderate pain or fever.   amLODipine 5 MG tablet Commonly known as:  NORVASC Take 5 mg by mouth daily.   BIOFREEZE EX Apply 1 application topically 4 (four) times daily as needed.   bismuth subsalicylate 262 MG chewable tablet Commonly known as:  PEPTO BISMOL Chew 524 mg by mouth as needed for indigestion.   cephALEXin 250 MG capsule Commonly known as:  KEFLEX Take 250 mg by mouth every morning.   cholecalciferol 1000 units tablet Commonly known as:  VITAMIN D Take 1,000 Units by mouth daily.   DULERA 100-5 MCG/ACT Aero Generic drug:  mometasone-formoterol Inhale 2 puffs into the lungs 2 (two) times daily. Rinse mouth after using   feeding supplement (GLUCERNA SHAKE) Liqd Take 237 mLs by mouth daily as needed (nutritional supplement).   feeding supplement (GLUCERNA SHAKE) Liqd Take 237 mLs by mouth 2 (two) times daily between meals.   insulin aspart 100 UNIT/ML injection Commonly known as:  novoLOG Inject 5-10 Units into the skin 3 (three) times daily before meals. 5 units at lunch, 10 units at breakfast and supper. Give 1 units for every 1 unit over 150.   polyethylene glycol packet Commonly known as:  MIRALAX / GLYCOLAX Take 17 g by mouth daily.   saccharomyces boulardii 250 MG capsule Commonly known as:  FLORASTOR Take 250 mg by mouth 2 (two) times daily.     sertraline 50 MG tablet Commonly known as:  ZOLOFT Take 50 mg by mouth at bedtime.   simvastatin 40 MG tablet Commonly known as:  ZOCOR Take 40 mg by mouth daily.   TOUJEO SOLOSTAR 300 UNIT/ML Sopn Generic drug:  Insulin Glargine Inject 18 Units into the skin at bedtime.   traMADol 50 MG tablet Commonly known as:  ULTRAM Take 50 mg by mouth every 6 (six) hours as needed for severe pain.       Review of Systems  Constitutional: Positive for activity change, appetite change and fatigue. Negative for chills, diaphoresis, fever and unexpected weight change.  HENT: Negative for congestion, postnasal drip  and rhinorrhea.   Eyes: Negative for visual disturbance.  Respiratory: Positive for shortness of breath and wheezing. Negative for cough and stridor.   Cardiovascular: Positive for leg swelling. Negative for chest pain and palpitations.  Gastrointestinal: Negative for abdominal distention, abdominal pain, constipation, diarrhea and nausea.  Genitourinary: Negative for difficulty urinating, dysuria and flank pain.  Musculoskeletal: Positive for arthralgias and gait problem. Negative for back pain, joint swelling and myalgias.  Neurological: Positive for weakness. Negative for dizziness, seizures, syncope, facial asymmetry, speech difficulty and headaches.       Lethargy  Hematological: Negative for adenopathy. Does not bruise/bleed easily.  Psychiatric/Behavioral: Positive for confusion. Negative for agitation and behavioral problems.    Immunization History  Administered Date(s) Administered  . Influenza Split 04/19/2014  . Influenza-Unspecified 06/28/2015, 05/14/2016  . Pneumococcal Conjugate-13 10/26/2012, 05/24/2013, 07/20/2014  . Pneumococcal Polysaccharide-23 07/28/2011  . Td 07/21/2007, 07/28/2011  . Zoster 07/28/2011, 06/21/2012   Pertinent  Health Maintenance Due  Topic Date Due  . FOOT EXAM  06/18/1938  . OPHTHALMOLOGY EXAM  01/28/2016  . URINE MICROALBUMIN   07/25/2016  . HEMOGLOBIN A1C  10/20/2016  . INFLUENZA VACCINE  Completed  . PNA vac Low Risk Adult  Completed   Fall Risk  04/23/2015  Falls in the past year? Yes  Number falls in past yr: 1   Functional Status Survey:    Vitals:   07/27/16 1003  BP: (!) 144/80  Pulse: 98  Resp: (!) 34  Temp: 99.7 F (37.6 C)  SpO2: (!) 88%   There is no height or weight on file to calculate BMI. Physical Exam  Constitutional: No distress.  HENT:  Head: Normocephalic and atraumatic.  Mouth/Throat: Oropharyngeal exudate present.  Drainage to both nares.    Eyes: Pupils are equal, round, and reactive to light. Right eye exhibits no discharge. Left eye exhibits no discharge.  Erythema to both conjunctiva with scleral edema  Neck: No JVD present. No tracheal deviation present. No thyromegaly present.  Cardiovascular: Normal rate and regular rhythm.   No murmur heard. Pulmonary/Chest: He is in respiratory distress. He has wheezes.  bilat rhonchi, use of accessory muscles  Abdominal: Soft. Bowel sounds are normal. He exhibits no distension. There is no tenderness.  Genitourinary: Penis normal. No penile tenderness.  Genitourinary Comments: Foley with yellow urine  Lymphadenopathy:    He has no cervical adenopathy.  Neurological: No cranial nerve deficit.  Able to move all ext on command.  Lethargic but arouses to verbal stimulus.   Skin: Skin is warm and dry. He is not diaphoretic. There is erythema (right foot with swelling, no drainage or tenderness).  Nursing note and vitals reviewed.   Labs reviewed:  Recent Labs  05/05/16 2033 05/06/16 0003 05/07/16 0443  NA 140 138 140  K 4.3 4.6 4.3  CL 109 109 109  CO2 24 22 24   GLUCOSE 142* 192* 83  BUN 48* 50* 39*  CREATININE 2.02* 2.03* 1.70*  CALCIUM 9.1 8.8* 9.3    Recent Labs  11/06/15 2358 01/27/16 1021  AST 29 42*  ALT 18 40  ALKPHOS 69 85  BILITOT 0.5 1.3*  PROT 7.5 6.4*  ALBUMIN 4.0 3.5    Recent Labs   11/06/15 2358  01/27/16 1021  03/29/16 1345  05/05/16 0650 05/05/16 1612 05/07/16 0443  WBC 10.0  < > 6.8  < > 10.8*  < > 7.0 6.9 6.5  NEUTROABS 7.3  --  4.7  --  7.2  --   --   --   --  HGB 13.3  < > 12.2*  < > 12.6*  < > 11.0* 11.6* 11.2*  HCT 38.2*  < > 34.7*  < > 38.1*  < > 35.3* 35.5* 34.5*  MCV 89.7  < > 89.4  --  92.0  --  93.4 90.3 90.6  PLT 327  < > 316  < > 565*  < > 403* 432* 425*  < > = values in this interval not displayed. Lab Results  Component Value Date   TSH 1.241 06/21/2015   Lab Results  Component Value Date   HGBA1C 6.5 04/21/2016   Lab Results  Component Value Date   CHOL 137 01/05/2015   HDL 48 01/05/2015   LDLCALC 59 01/05/2015   TRIG 148 01/05/2015   CHOLHDL 2.9 01/05/2015    Significant Diagnostic Results in last 30 days:  No results found.  Assessment/Plan  1. Acute respiratory failure with hypoxia (HCC) Most likely due to resp infection given his exam findings Continue 02 for comfort only Duoneb q 4 hrs prn sob/wheeze Morphine 5 mg q4 hrs orally as needed for sob/pain Ativan 0.5 mg q4 hrs prn anxiety/sob  2. Cellulitis of right lower extremity Noted to right foot but no wound or pain on palpation  3. Uncontrolled type 1 diabetes mellitus with hyperglycemia (HCC) Will d/c all cbg monitoring and insulin Staff can implement protocol for hypoglycemia if needed  4. Advanced care planning/counseling discussion I spoke with his wife Jerene Dilling. She was made aware that he appears to be in a dying state.  She is ready to implement comfort care measures only.  He has had a poor quality of life and has been depressed for quite some time.  I let her know that he appears to have a resp infection and a cellulitis. We agreed to no antibiotics, no hospitalizations, and comfort care only.  He will receive morphine and ativan for sob/pain. He is a DNR.     Family/ staff Communication: discussed with his wife, Alethia Berthold ordered:  None due to  goals of care

## 2016-08-20 DEATH — deceased

## 2016-11-19 IMAGING — US US RENAL
1 series · 14 of 25 positions shown · non-contrast
Comparison: CT 01/04/2015

CLINICAL DATA: Acute kidney injury.

EXAM:
RENAL / URINARY TRACT ULTRASOUND COMPLETE

[Series 1: us renal · 0.26mm/px · 14 of 34 slices shown]
[im 1/34]
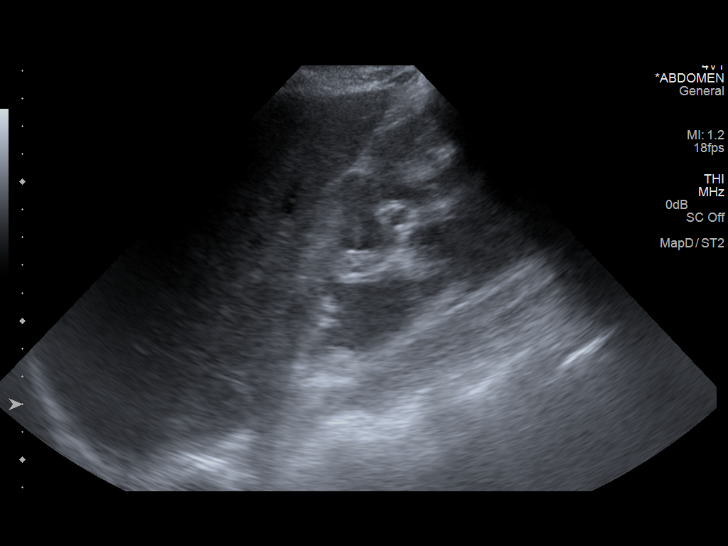
[im 3/34]
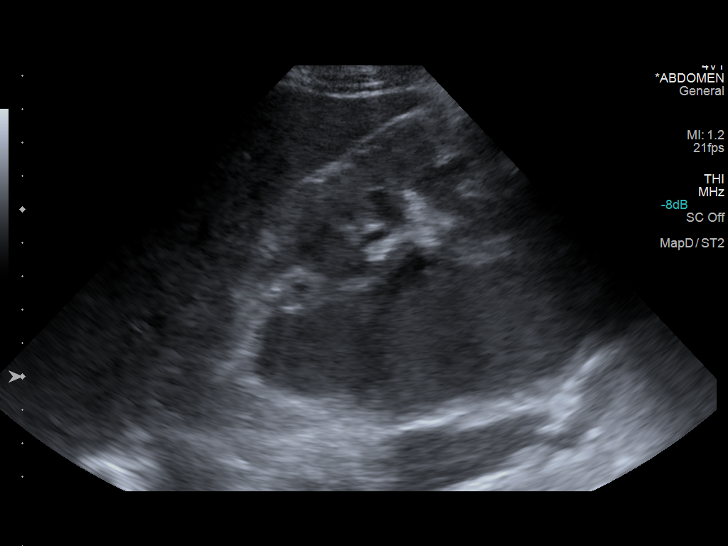
[im 6/34]
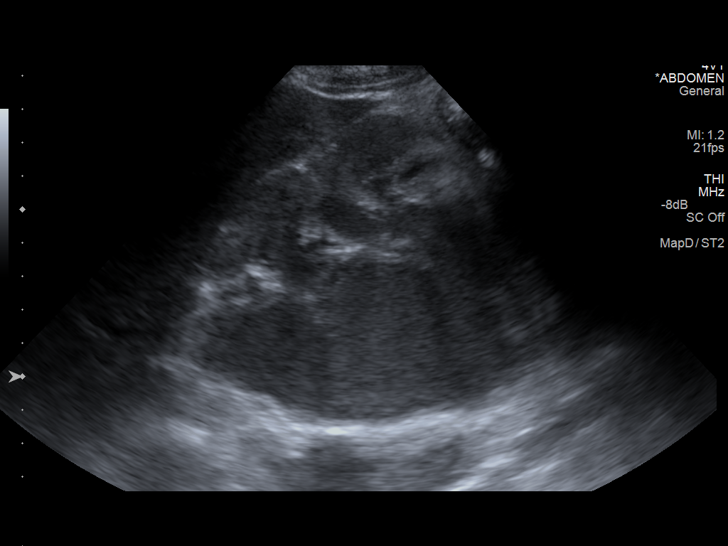
[im 9/34]
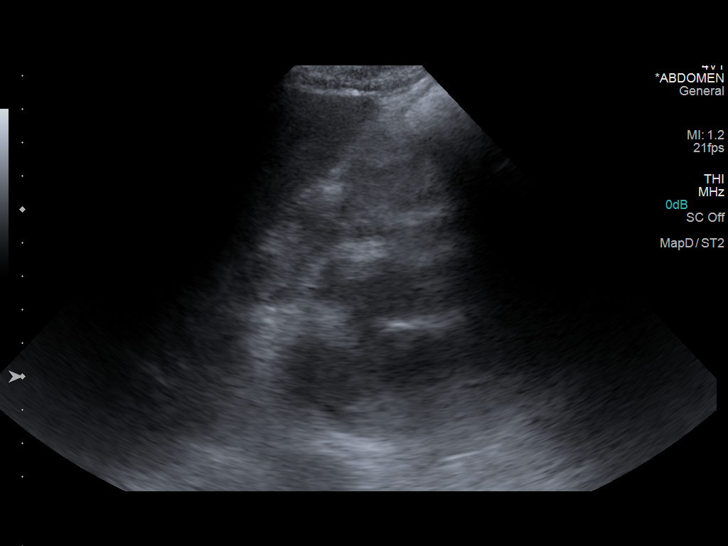
[im 12/34]
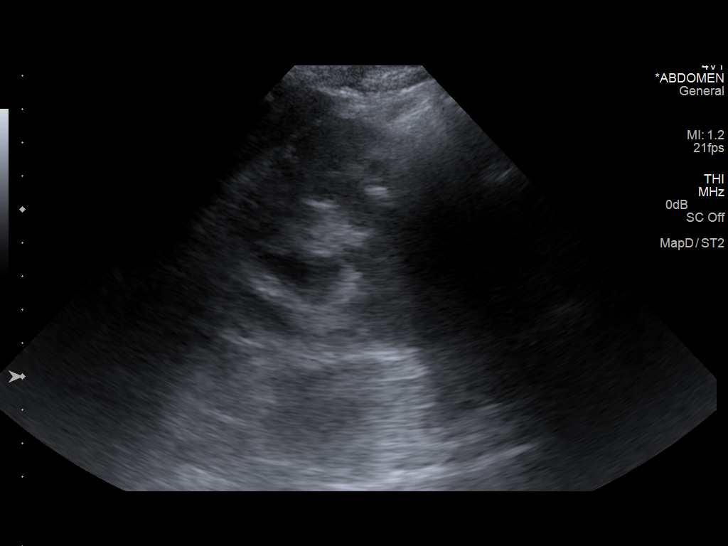
[im 13/34]
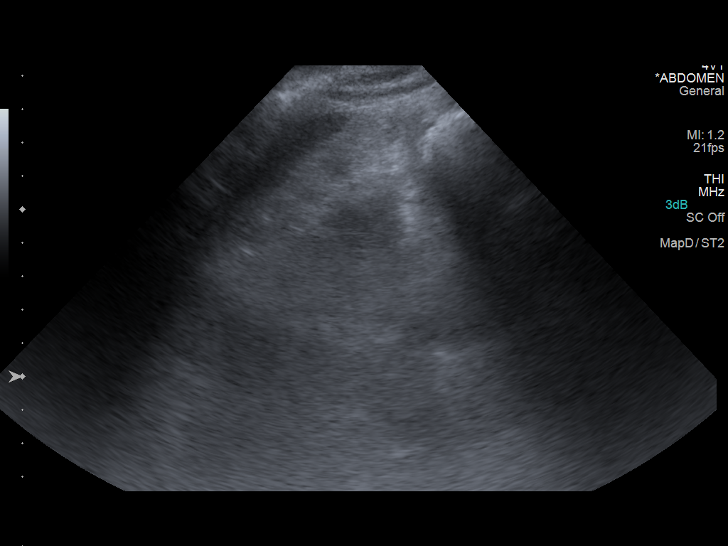
[im 16/34]
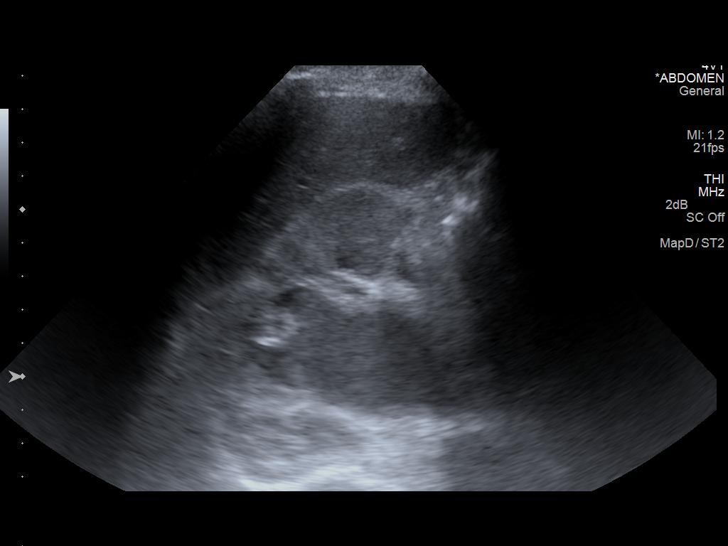
[im 18/34]
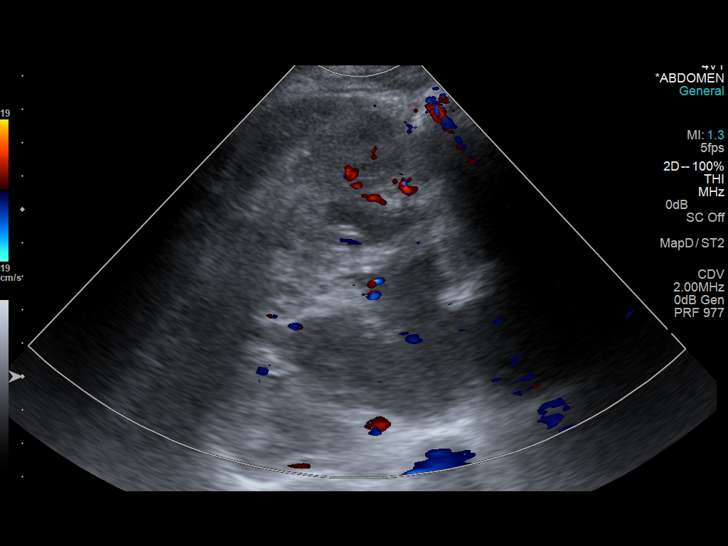
[im 21/34]
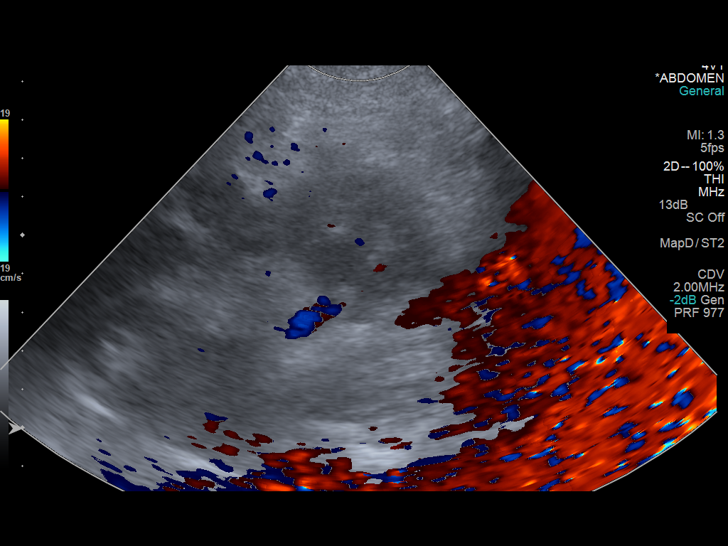
[im 23/34]
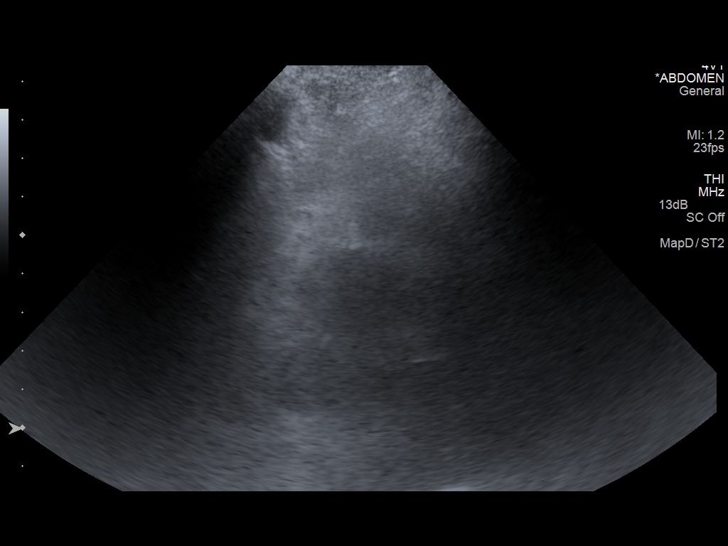
[im 25/34]
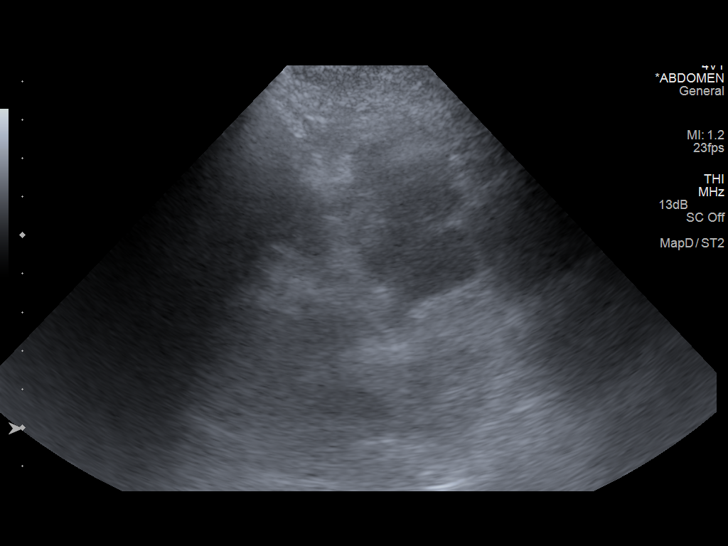
[im 28/34]
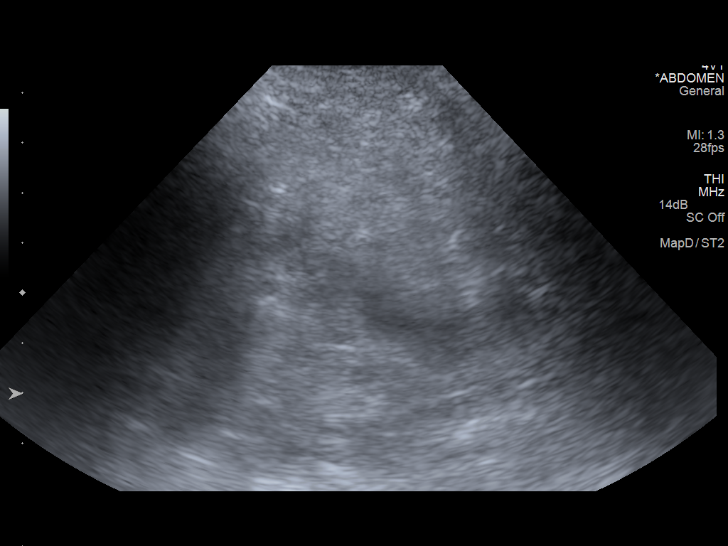
[im 31/34]
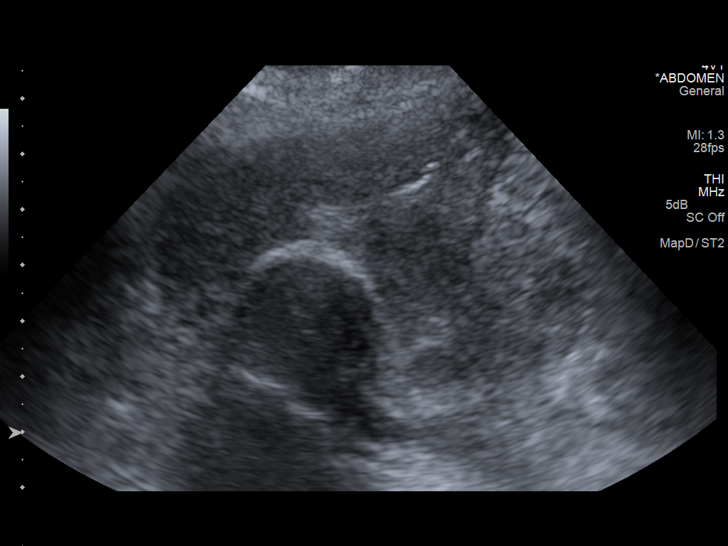
[im 34/34]
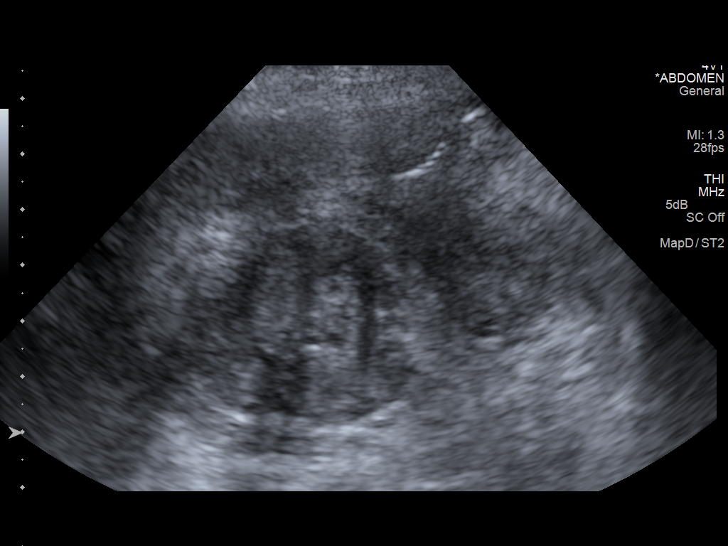

[14 of 25 positions shown; findings below may reference images not displayed]

FINDINGS: Right Kidney:

Length: 12.2 cm. Generalized parenchymal echogenicity consistent
with chronic medical renal disease. Cortical thinning. Moderate
pelvic caliectasis and large extrarenal pelvis, unchanged from the
01/04/2015 CT.

Left Kidney:

Length: 10.1 cm. Generalized parenchymal echogenicity consistent
with chronic medical renal disease. No hydronephrosis. Cortical
thinning and irregularity, unchanged from the CT of 01/04/2015.

Bladder:

Collapsed around a Foley catheter.
IMPRESSION: Both kidneys are atrophic and irregular with increased echogenicity
consistent with chronic medical renal disease. No convincing
interval change from 01/04/2015 CT.

## 2017-07-15 IMAGING — CT CT HEAD W/O CM
1 series · 15 of 30 positions shown, 19 images · non-contrast
Comparison: Head CT June 14, 2005 and brain MRI November 04, 2013

CLINICAL DATA: Confusion

EXAM:
CT HEAD WITHOUT CONTRAST
TECHNIQUE: Contiguous axial images were obtained from the base of the skull
through the vertex without intravenous contrast.

[Series 2: headseq 4.8 h45s · axial · 0.43mm/px · z∈[-135,+23]mm · 15 of 36 slices shown, 19 images]
[im 2/36  brain]
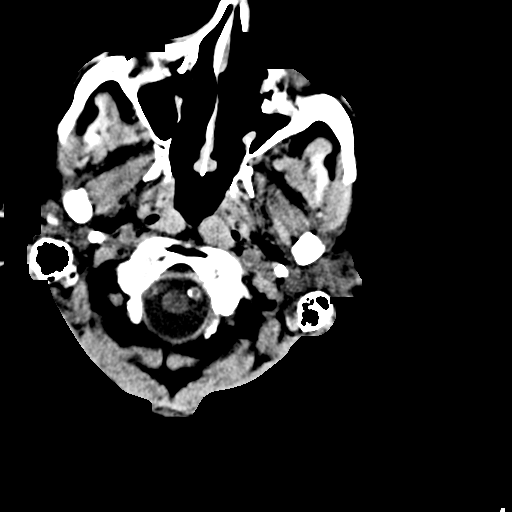
[im 2/36  bone]
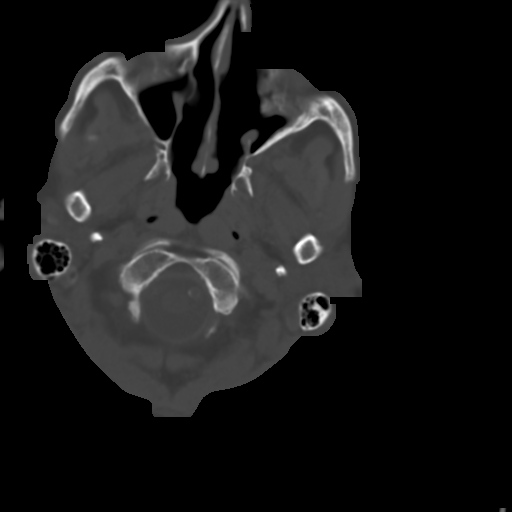
[im 4/36  brain]
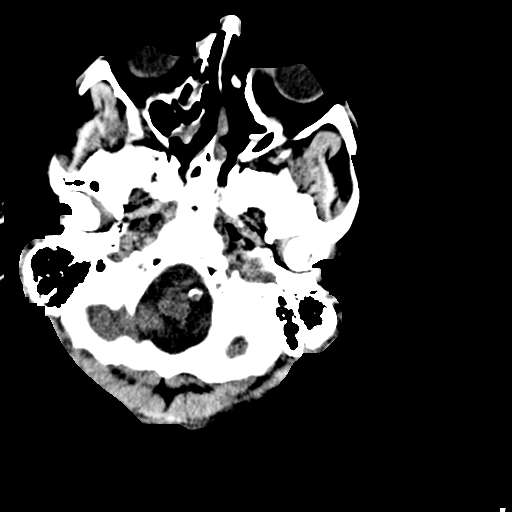
[im 7/36  brain]
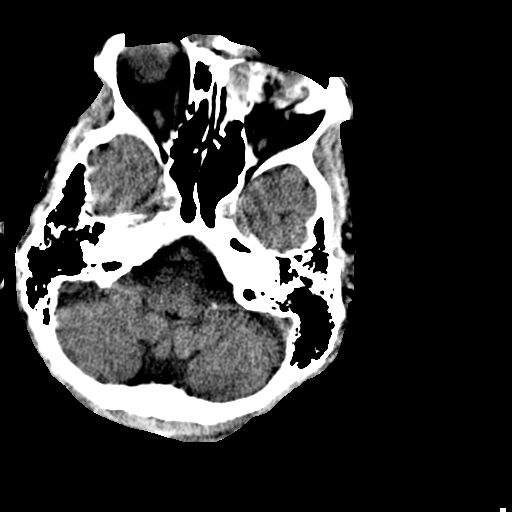
[im 9/36  brain]
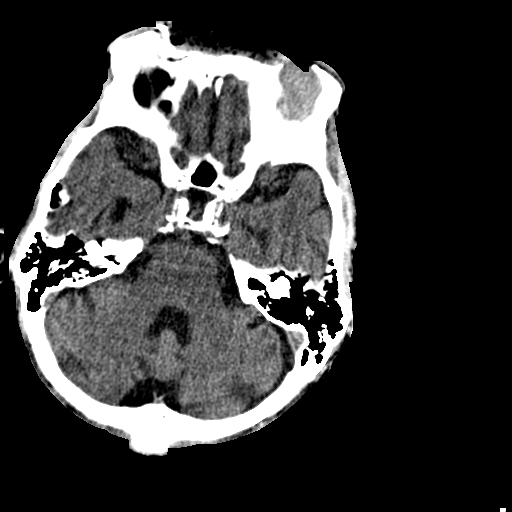
[im 11/36  brain]
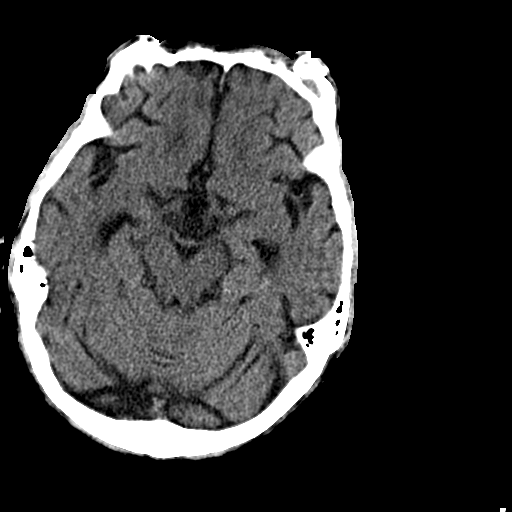
[im 11/36  bone]
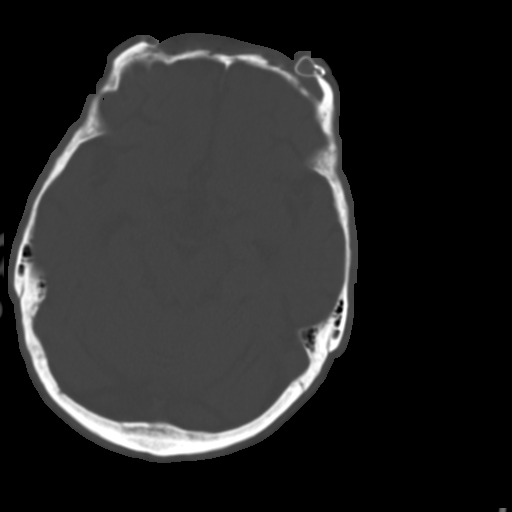
[im 14/36  brain]
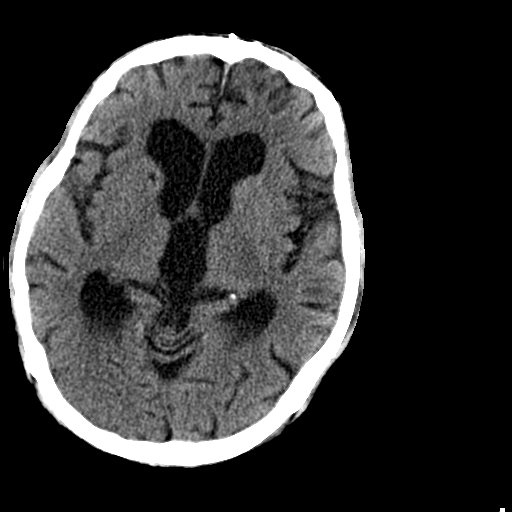
[im 16/36  brain]
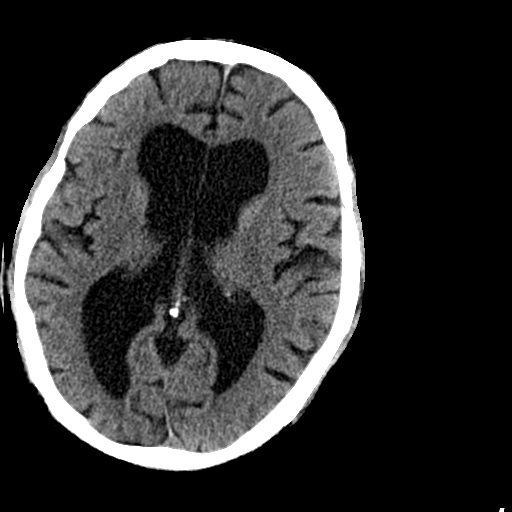
[im 19/36  brain]
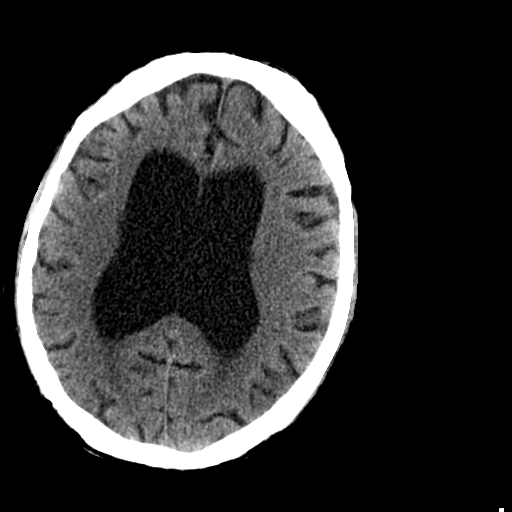
[im 20/36  brain]
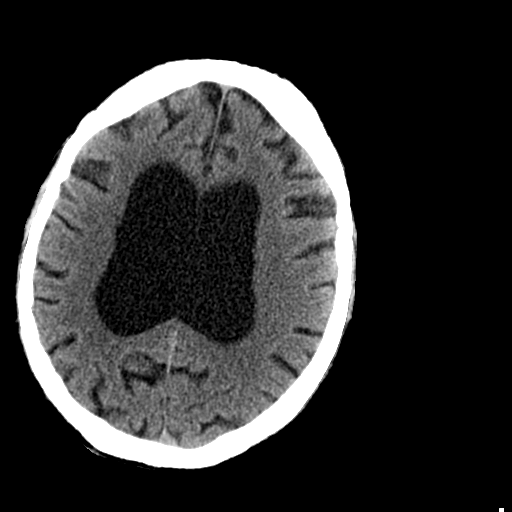
[im 20/36  bone]
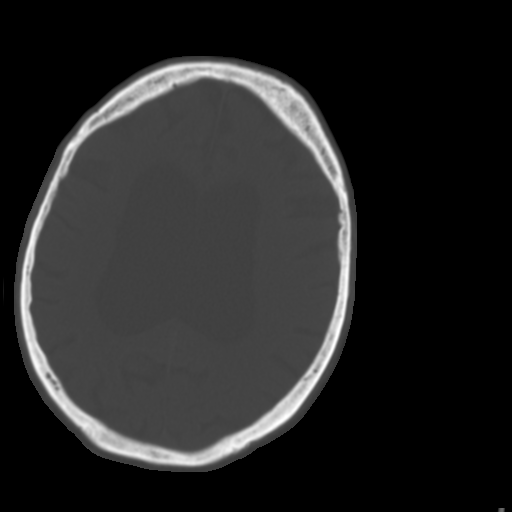
[im 22/36  brain]
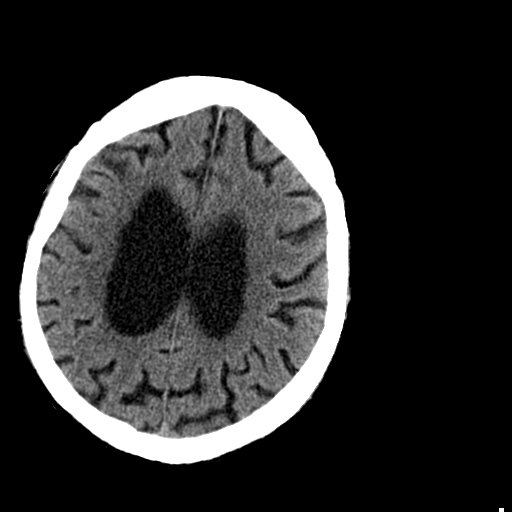
[im 25/36  brain]
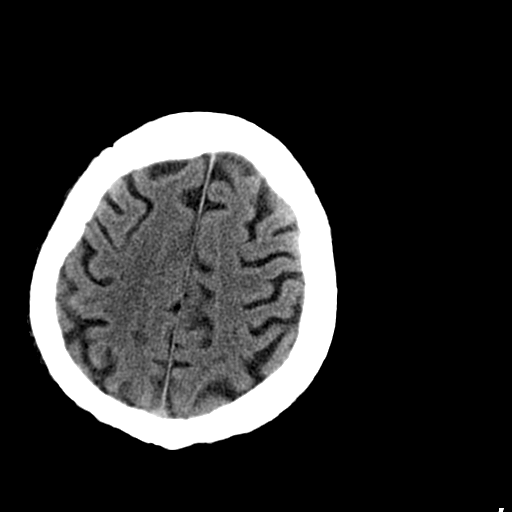
[im 27/36  brain]
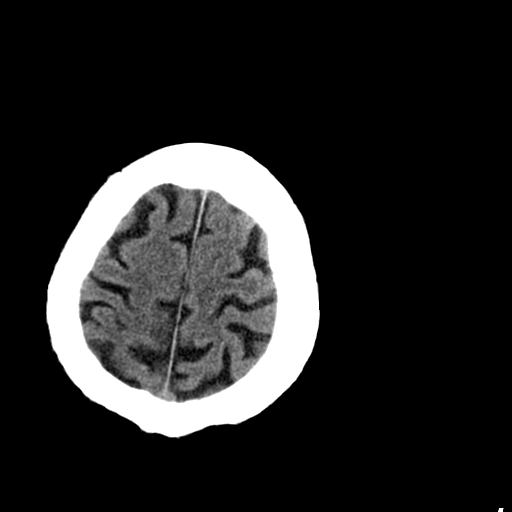
[im 29/36  brain]
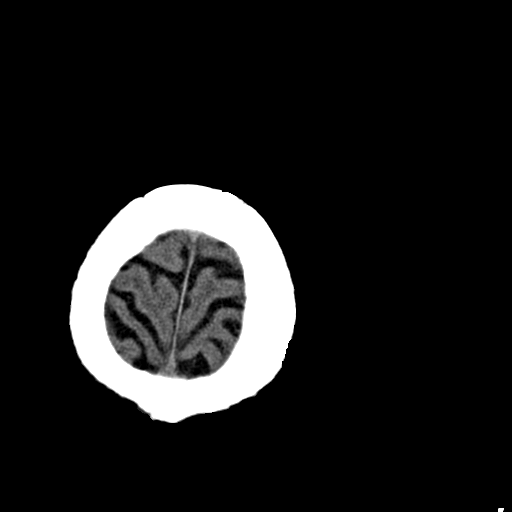
[im 29/36  bone]
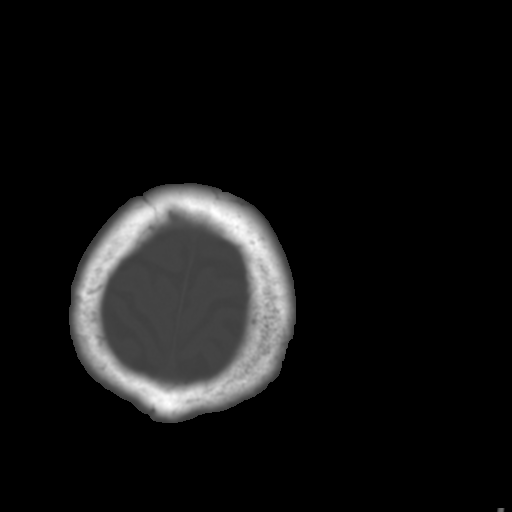
[im 32/36  brain]
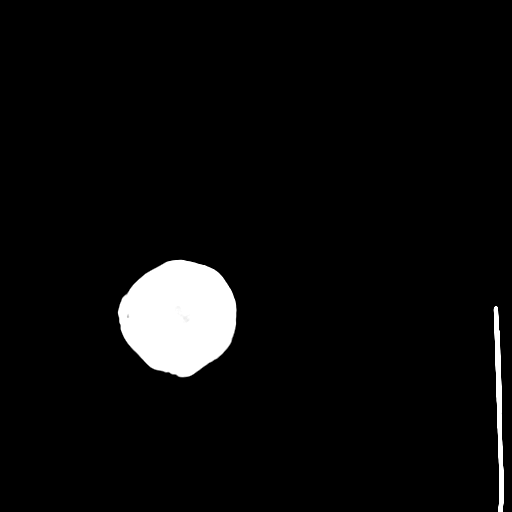
[im 34/36  brain]
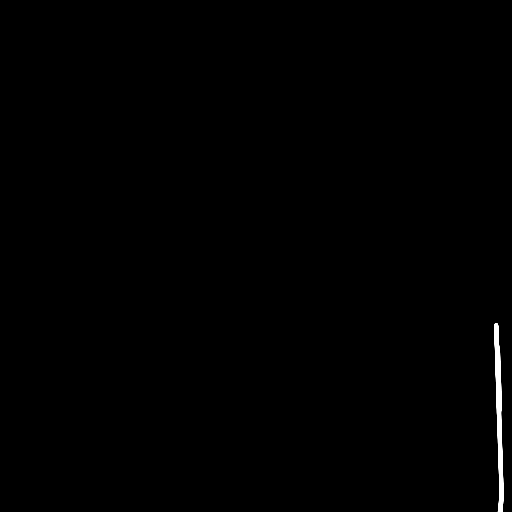

[15 of 30 positions shown; findings below may reference images not displayed]

FINDINGS: There is generalized ventricular enlargement, stable compared to
prior studies. The sulci are mildly prominent, stable. There is no
intracranial mass, hemorrhage, or extra-axial fluid collection. No
midline shift. There is a prior small lacunar infarct in the head of
the caudate nucleus on the right. Elsewhere gray-white compartments
appear normal. No acute infarct is evident. The bony calvarium shows
evidence of an old right frontal bone fracture, healed. There is
evidence of prior anterior facial trauma with remodeling and packing
with fat. No intraorbital lesions are appreciated. Mastoid air cells
are clear.
IMPRESSION: Diffuse ventricular enlargement is a stable finding. This degree of
ventricular enlargement, disproportionate to the sulci, raises
question of normal pressure hydrocephalus. The chronicity of this
finding is uncertain with respect to potential normal pressure
hydrocephalus.

Prior small lacunar infarct in the head of the caudate nucleus on
the right. No hemorrhage, intra-axial mass, or acute appearing
infarct.

Extensive postoperative change in the upper facial region. Old
healed fracture right frontal bone. No acute fracture evident.
Mastoid air cells are clear.

## 2017-10-13 IMAGING — CR DG CHEST 2V
2 series · 2 of 2 positions shown · non-contrast
Comparison: None.

CLINICAL DATA: Fall 4 days ago.  Left rib pain.

EXAM:
CHEST  2 VIEW

[w chest pa]
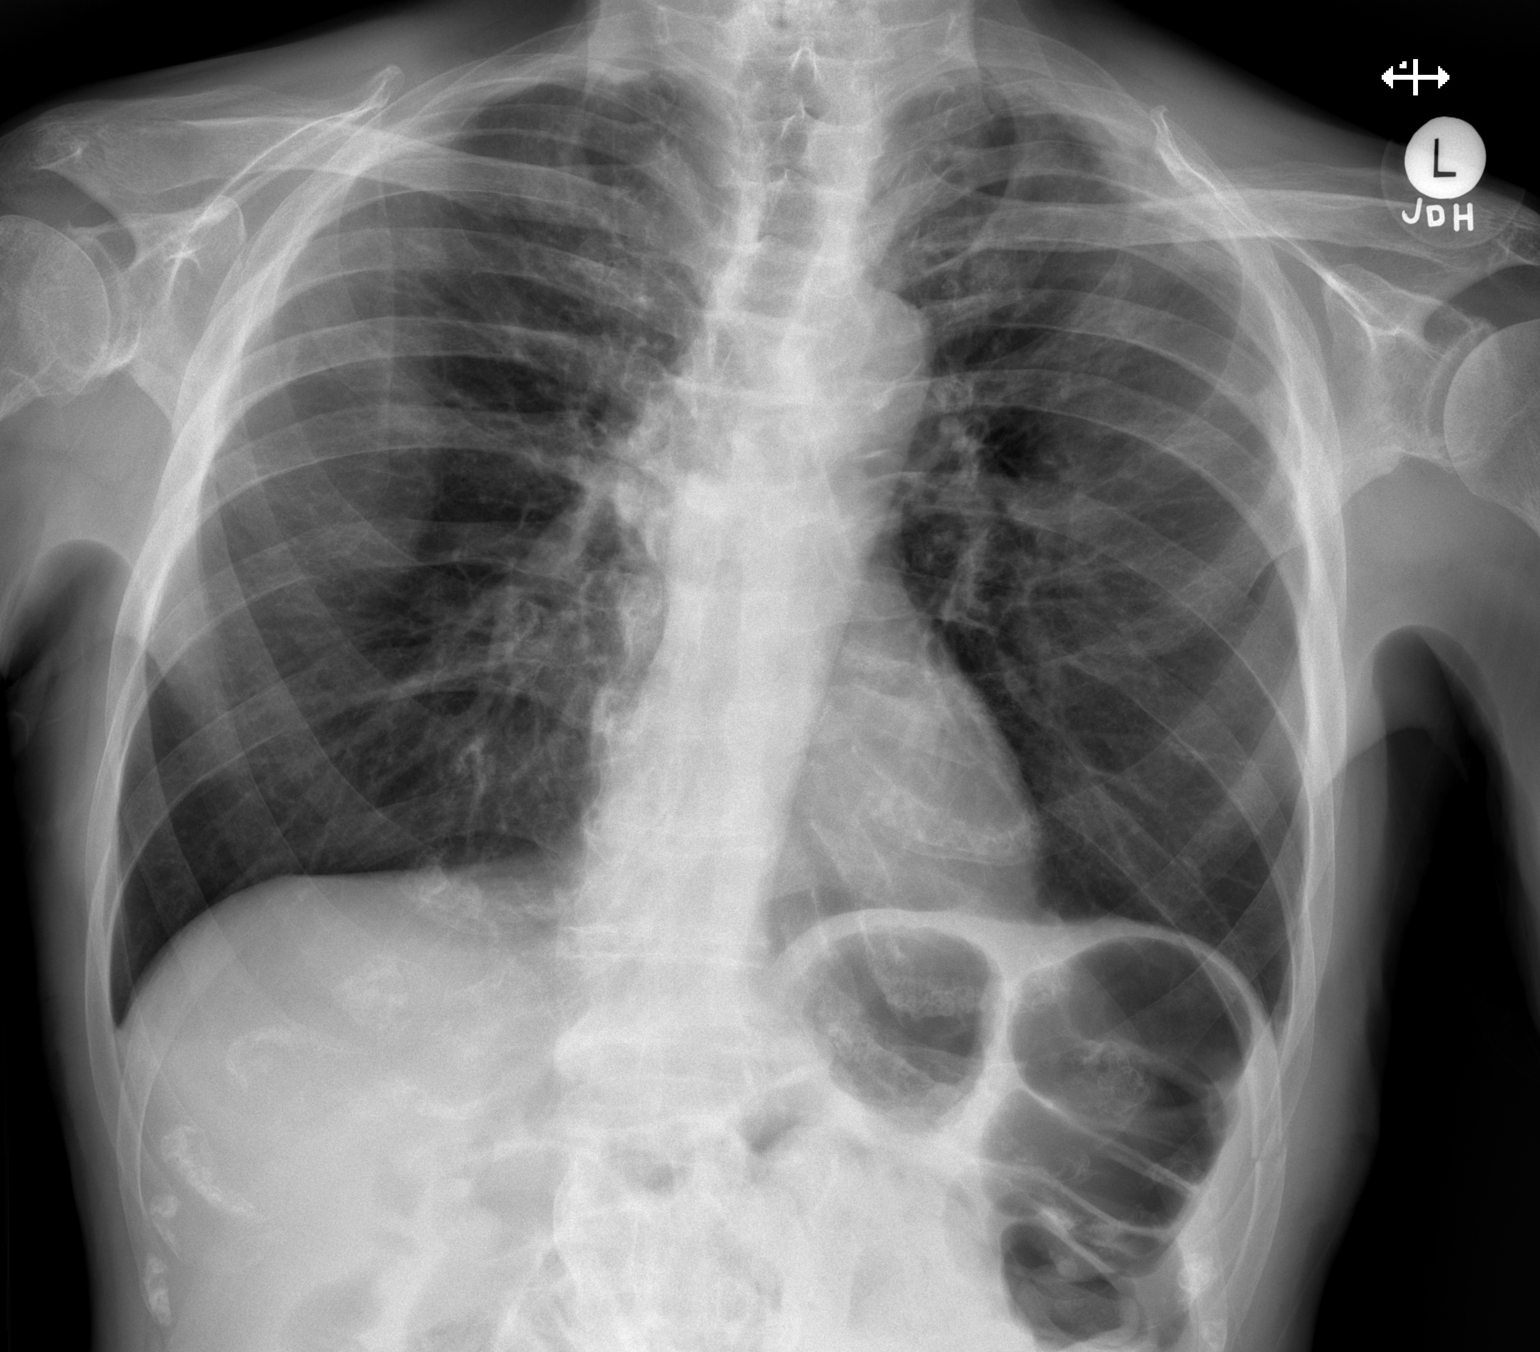

[w chest lat]
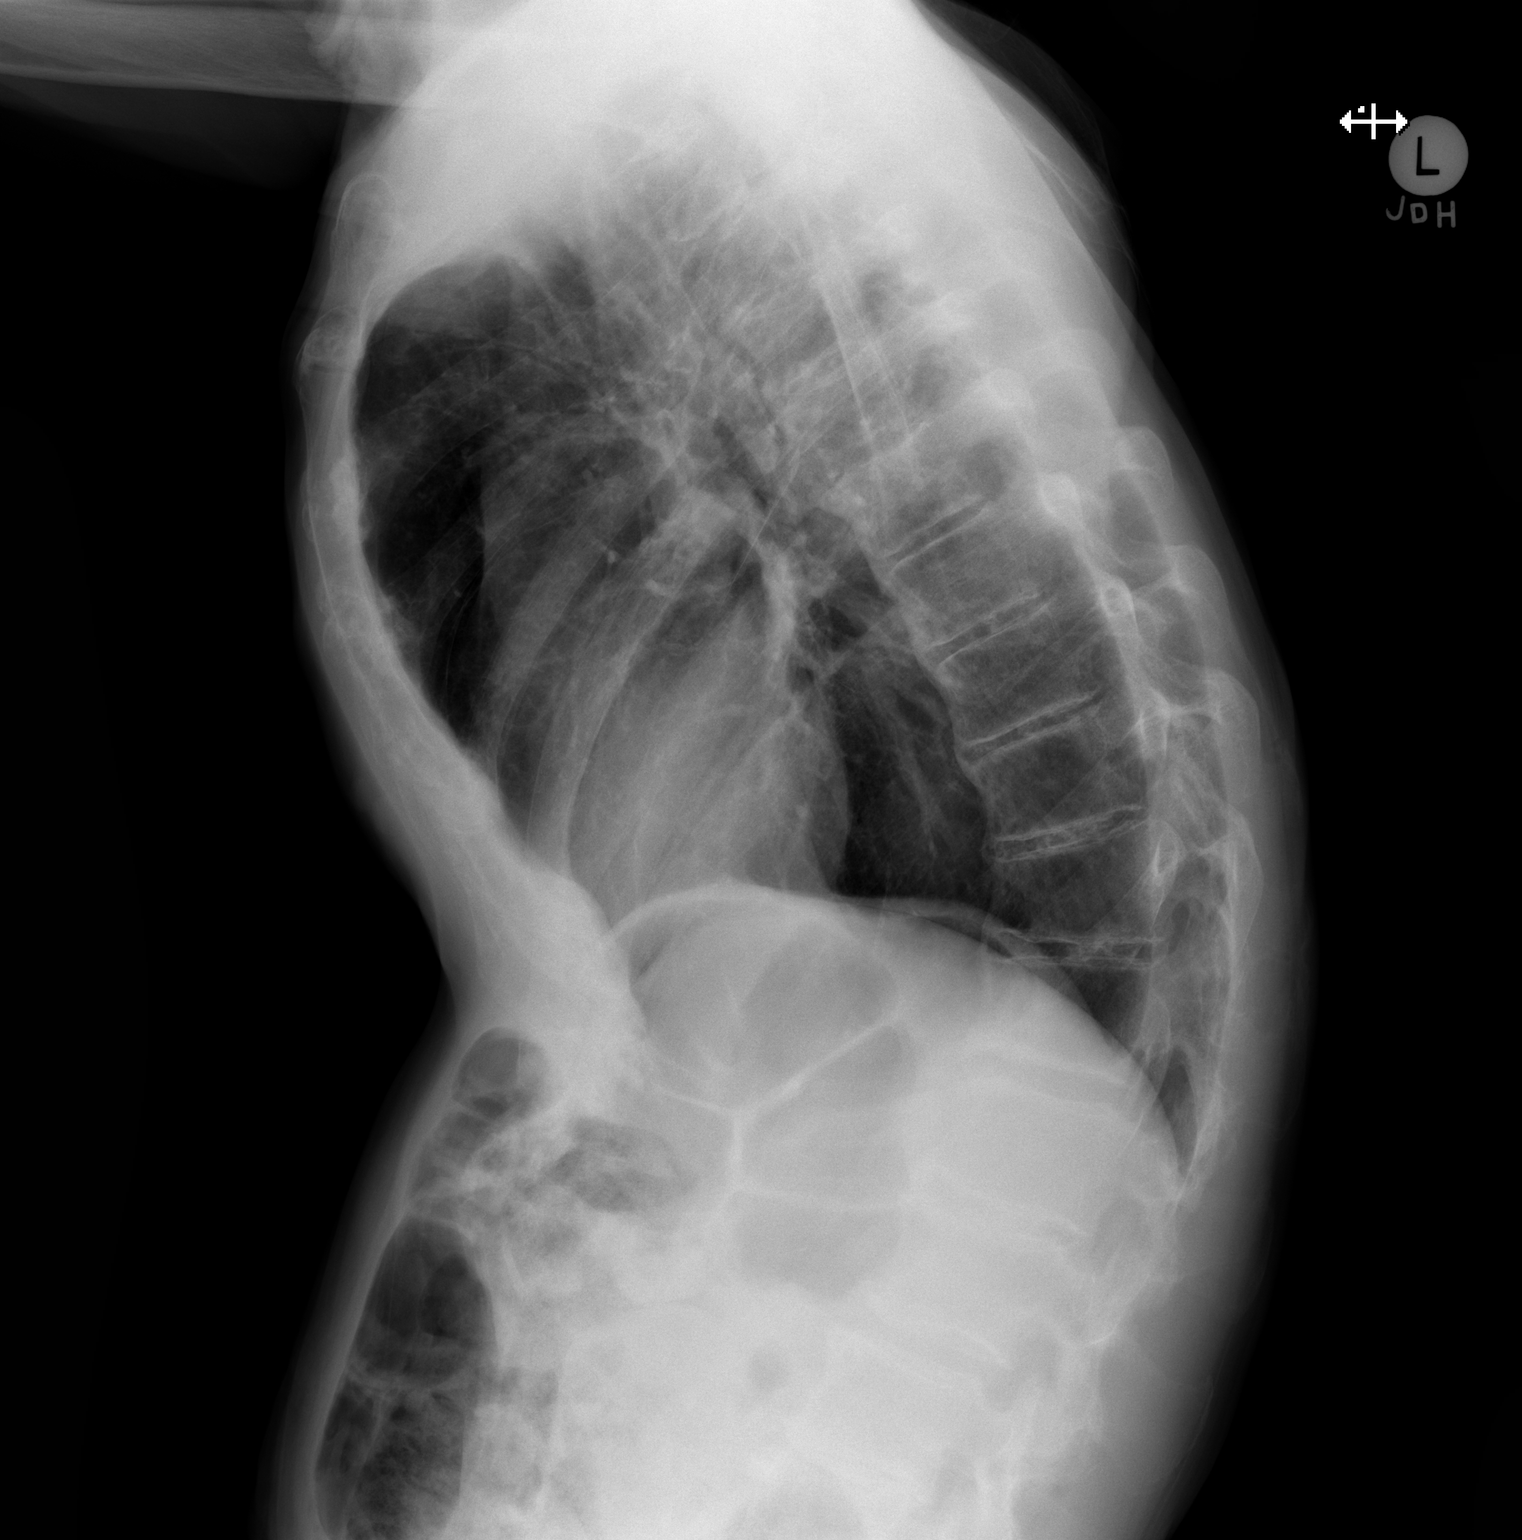

[2 of 2 positions shown; findings below may reference images not displayed]

FINDINGS: There are fractures noted through the eighth and ninth left ribs
laterally. No pneumothorax or pleural effusion. No focal airspace
opacity. Heart is normal size.
IMPRESSION: Left eighth and ninth rib fractures.  No effusion or pneumothorax.

## 2018-05-07 IMAGING — DX DG CHEST 1V PORT
1 series · 1 of 1 positions shown · non-contrast
Comparison: Chest x-ray 01/27/2016, 12/16/2015 and earlier.

CLINICAL DATA: Post right thoracentesis.

EXAM:
PORTABLE CHEST 1 VIEW

[chest ap]
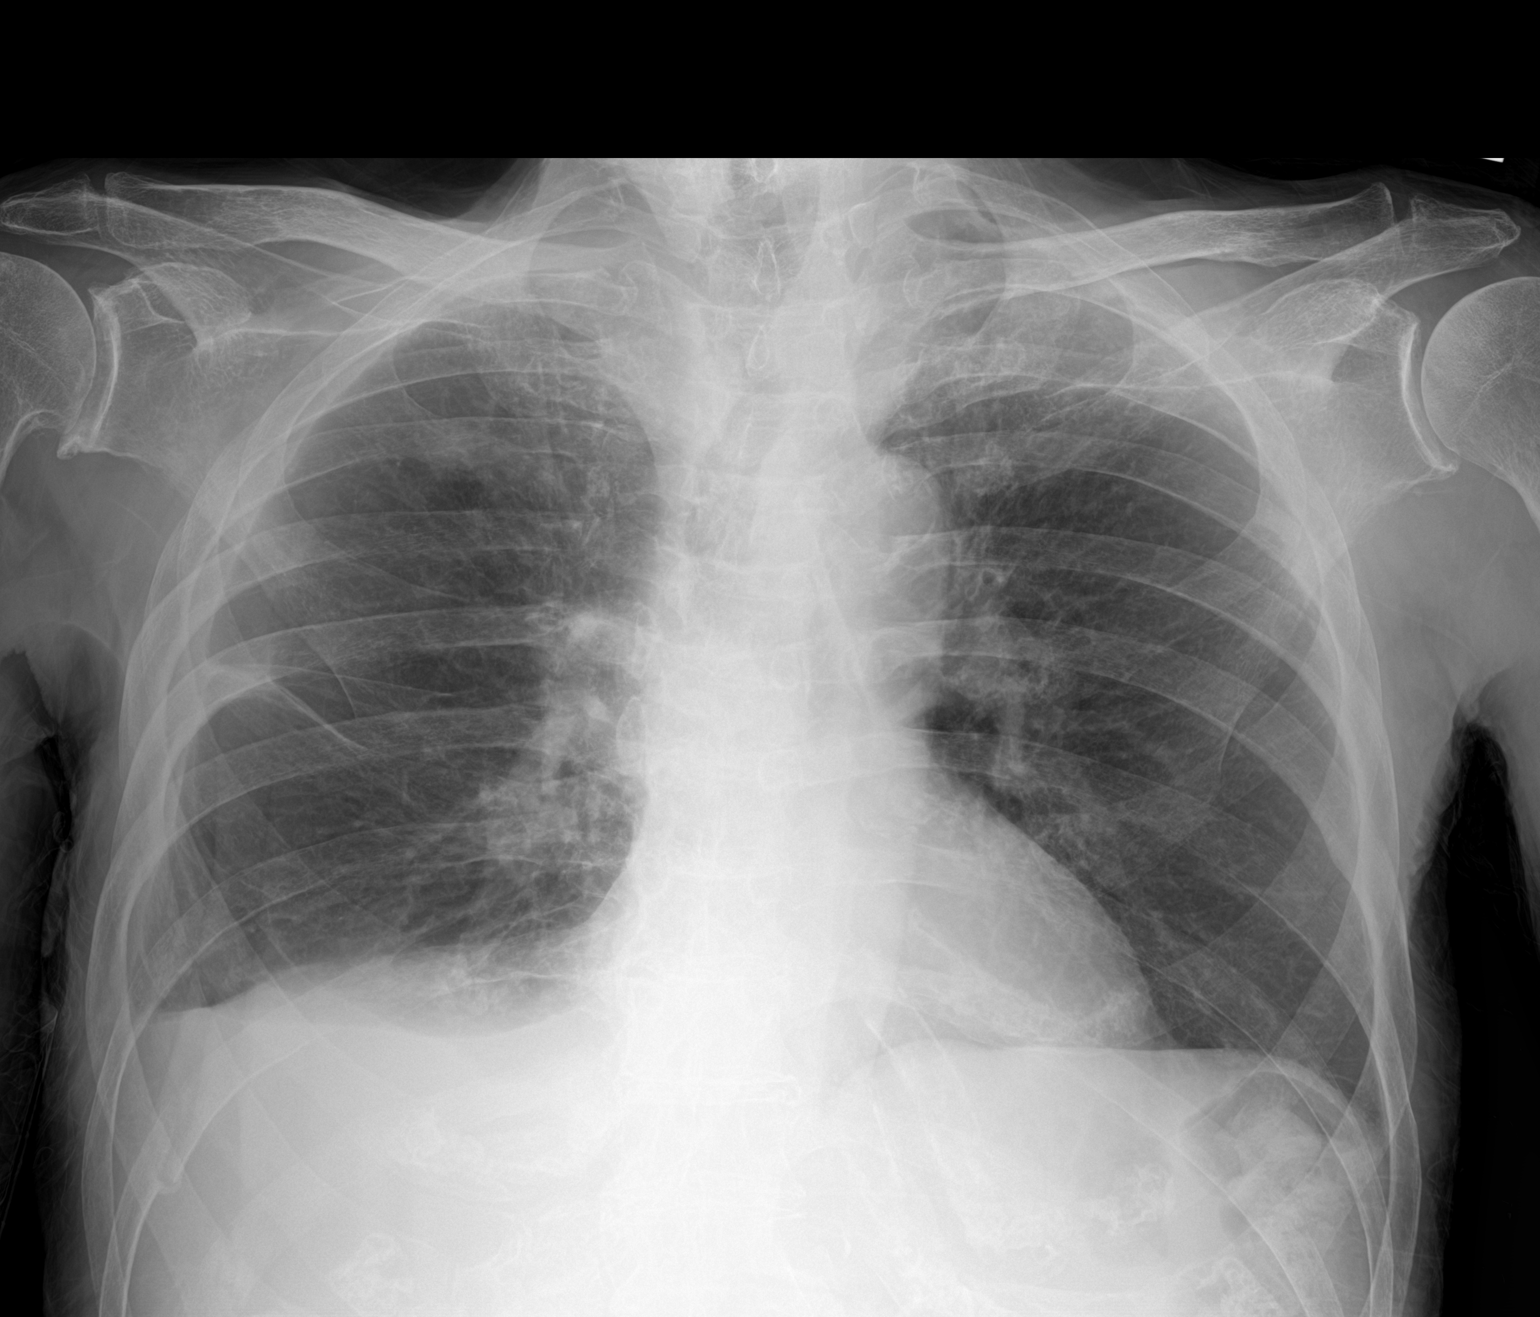

[1 of 1 positions shown; findings below may reference images not displayed]

FINDINGS: Residual small right pleural effusion. No evidence of right
pneumothorax after thoracentesis. Associated consolidation in the
right lower lobe and right middle lobe. Left lung clear. No visible
left pleural effusion.

Cardiac silhouette normal in size, unchanged. Thoracic aorta
atherosclerotic, unchanged.
IMPRESSION: 1. No pneumothorax after right thoracentesis.
2. Residual small right pleural effusion.
3. Associated passive atelectasis and/or pneumonia involving the
right lower lobe and right middle lobe.
